# Patient Record
Sex: Female | Born: 1945
Health system: Southern US, Community
[De-identification: ages and names within clinical notes are randomized; demographics above are authoritative.]

## PROBLEM LIST (undated history)

## (undated) DIAGNOSIS — I4891 Unspecified atrial fibrillation: Secondary | ICD-10-CM

## (undated) DIAGNOSIS — Z720 Tobacco use: Secondary | ICD-10-CM

## (undated) DIAGNOSIS — Z8669 Personal history of other diseases of the nervous system and sense organs: Secondary | ICD-10-CM

## (undated) DIAGNOSIS — M069 Rheumatoid arthritis, unspecified: Secondary | ICD-10-CM

## (undated) DIAGNOSIS — I340 Nonrheumatic mitral (valve) insufficiency: Secondary | ICD-10-CM

## (undated) DIAGNOSIS — F319 Bipolar disorder, unspecified: Secondary | ICD-10-CM

## (undated) DIAGNOSIS — I482 Chronic atrial fibrillation, unspecified: Secondary | ICD-10-CM

## (undated) DIAGNOSIS — H269 Unspecified cataract: Secondary | ICD-10-CM

## (undated) DIAGNOSIS — M1990 Unspecified osteoarthritis, unspecified site: Secondary | ICD-10-CM

## (undated) DIAGNOSIS — H409 Unspecified glaucoma: Secondary | ICD-10-CM

## (undated) DIAGNOSIS — Z716 Tobacco abuse counseling: Secondary | ICD-10-CM

## (undated) DIAGNOSIS — E785 Hyperlipidemia, unspecified: Secondary | ICD-10-CM

## (undated) DIAGNOSIS — I252 Old myocardial infarction: Secondary | ICD-10-CM

## (undated) DIAGNOSIS — I4510 Unspecified right bundle-branch block: Secondary | ICD-10-CM

## (undated) DIAGNOSIS — J449 Chronic obstructive pulmonary disease, unspecified: Secondary | ICD-10-CM

## (undated) DIAGNOSIS — K56609 Unspecified intestinal obstruction, unspecified as to partial versus complete obstruction: Secondary | ICD-10-CM

## (undated) DIAGNOSIS — R413 Other amnesia: Secondary | ICD-10-CM

## (undated) DIAGNOSIS — F419 Anxiety disorder, unspecified: Secondary | ICD-10-CM

## (undated) DIAGNOSIS — I272 Pulmonary hypertension, unspecified: Secondary | ICD-10-CM

## (undated) HISTORY — DX: Hyperlipidemia, unspecified: E78.5

## (undated) HISTORY — PX: CHOLECYSTECTOMY: SHX55

## (undated) HISTORY — DX: Unspecified osteoarthritis, unspecified site: M19.90

## (undated) HISTORY — PX: NOSE SURGERY: SHX723

## (undated) HISTORY — PX: BACK SURGERY: SHX140

## (undated) HISTORY — DX: Other amnesia: R41.3

## (undated) HISTORY — DX: Anxiety disorder, unspecified: F41.9

## (undated) HISTORY — DX: Tobacco use: Z72.0

## (undated) HISTORY — DX: Unspecified atrial fibrillation: I48.91

## (undated) HISTORY — PX: OTHER SURGICAL HISTORY: SHX169

## (undated) HISTORY — DX: Unspecified intestinal obstruction, unspecified as to partial versus complete obstruction: K56.609

## (undated) HISTORY — DX: Pulmonary hypertension, unspecified: I27.20

## (undated) HISTORY — DX: Personal history of other diseases of the nervous system and sense organs: Z86.69

## (undated) HISTORY — DX: Old myocardial infarction: I25.2

## (undated) HISTORY — DX: Unspecified glaucoma: H40.9

## (undated) HISTORY — PX: VAGINAL HYSTERECTOMY: SUR661

## (undated) HISTORY — PX: TONSILLECTOMY: SUR1361

## (undated) HISTORY — DX: Tobacco abuse counseling: Z71.6

## (undated) HISTORY — DX: Rheumatoid arthritis, unspecified: M06.9

## (undated) HISTORY — DX: Bipolar disorder, unspecified: F31.9

## (undated) HISTORY — DX: Chronic obstructive pulmonary disease, unspecified: J44.9

## (undated) HISTORY — PX: ADENOIDECTOMY: SUR15

## (undated) HISTORY — DX: Nonrheumatic mitral (valve) insufficiency: I34.0

## (undated) HISTORY — DX: Unspecified right bundle-branch block: I45.10

## (undated) HISTORY — DX: Chronic atrial fibrillation, unspecified: I48.20

## (undated) HISTORY — DX: Unspecified cataract: H26.9

---

## 2014-09-15 DIAGNOSIS — I482 Chronic atrial fibrillation, unspecified: Secondary | ICD-10-CM

## 2014-09-15 DIAGNOSIS — I272 Pulmonary hypertension, unspecified: Secondary | ICD-10-CM

## 2014-09-15 DIAGNOSIS — I252 Old myocardial infarction: Secondary | ICD-10-CM

## 2014-09-15 HISTORY — PX: CARDIAC CATHETERIZATION: SHX172

## 2014-09-15 HISTORY — DX: Pulmonary hypertension, unspecified: I27.20

## 2014-09-15 HISTORY — DX: Old myocardial infarction: I25.2

## 2014-09-15 HISTORY — DX: Chronic atrial fibrillation, unspecified: I48.20

## 2014-09-15 HISTORY — PX: TRANSESOPHAGEAL ECHOCARDIOGRAM WITH CARDIOVERSION: SHX6140

## 2014-12-10 LAB — PROTIME-INR: INR: 1.8 — AB (ref 0.9–1.1)

## 2015-11-03 DIAGNOSIS — I2510 Atherosclerotic heart disease of native coronary artery without angina pectoris: Secondary | ICD-10-CM | POA: Diagnosis not present

## 2015-11-03 DIAGNOSIS — R413 Other amnesia: Secondary | ICD-10-CM | POA: Diagnosis not present

## 2015-11-03 DIAGNOSIS — F3170 Bipolar disorder, currently in remission, most recent episode unspecified: Secondary | ICD-10-CM | POA: Diagnosis not present

## 2015-11-03 DIAGNOSIS — I4891 Unspecified atrial fibrillation: Secondary | ICD-10-CM | POA: Diagnosis not present

## 2015-11-03 DIAGNOSIS — Z7901 Long term (current) use of anticoagulants: Secondary | ICD-10-CM | POA: Diagnosis not present

## 2015-11-03 DIAGNOSIS — G8929 Other chronic pain: Secondary | ICD-10-CM | POA: Diagnosis not present

## 2015-11-12 DIAGNOSIS — Z7901 Long term (current) use of anticoagulants: Secondary | ICD-10-CM | POA: Diagnosis not present

## 2015-11-19 DIAGNOSIS — Z7901 Long term (current) use of anticoagulants: Secondary | ICD-10-CM | POA: Diagnosis not present

## 2015-11-30 DIAGNOSIS — M79671 Pain in right foot: Secondary | ICD-10-CM | POA: Diagnosis not present

## 2015-11-30 DIAGNOSIS — M79641 Pain in right hand: Secondary | ICD-10-CM | POA: Diagnosis not present

## 2015-11-30 DIAGNOSIS — R5383 Other fatigue: Secondary | ICD-10-CM | POA: Diagnosis not present

## 2015-11-30 DIAGNOSIS — M545 Low back pain: Secondary | ICD-10-CM | POA: Diagnosis not present

## 2015-11-30 DIAGNOSIS — M79672 Pain in left foot: Secondary | ICD-10-CM | POA: Diagnosis not present

## 2015-11-30 DIAGNOSIS — M0609 Rheumatoid arthritis without rheumatoid factor, multiple sites: Secondary | ICD-10-CM | POA: Diagnosis not present

## 2015-11-30 DIAGNOSIS — M79642 Pain in left hand: Secondary | ICD-10-CM | POA: Diagnosis not present

## 2015-11-30 DIAGNOSIS — M5136 Other intervertebral disc degeneration, lumbar region: Secondary | ICD-10-CM | POA: Diagnosis not present

## 2015-12-09 ENCOUNTER — Other Ambulatory Visit: Payer: Self-pay | Admitting: Physician Assistant

## 2015-12-09 ENCOUNTER — Ambulatory Visit
Admission: RE | Admit: 2015-12-09 | Discharge: 2015-12-09 | Disposition: A | Discharge status: Home / Self Care | Payer: Medicare Other | Source: Ambulatory Visit | Attending: Physician Assistant | Admitting: Physician Assistant

## 2015-12-09 DIAGNOSIS — Z8719 Personal history of other diseases of the digestive system: Secondary | ICD-10-CM

## 2015-12-09 DIAGNOSIS — R1084 Generalized abdominal pain: Secondary | ICD-10-CM

## 2015-12-09 DIAGNOSIS — K5900 Constipation, unspecified: Secondary | ICD-10-CM

## 2015-12-15 ENCOUNTER — Ambulatory Visit (INDEPENDENT_AMBULATORY_CARE_PROVIDER_SITE_OTHER): Payer: Medicare Other | Admitting: Neurology

## 2015-12-15 ENCOUNTER — Encounter: Payer: Self-pay | Admitting: Neurology

## 2015-12-15 ENCOUNTER — Other Ambulatory Visit (INDEPENDENT_AMBULATORY_CARE_PROVIDER_SITE_OTHER): Payer: Medicare Other

## 2015-12-15 ENCOUNTER — Telehealth: Payer: Self-pay | Admitting: Family Medicine

## 2015-12-15 VITALS — BP 100/70 | HR 68 | Ht 63.0 in | Wt 160.1 lb

## 2015-12-15 DIAGNOSIS — F17200 Nicotine dependence, unspecified, uncomplicated: Secondary | ICD-10-CM

## 2015-12-15 DIAGNOSIS — R4689 Other symptoms and signs involving appearance and behavior: Principal | ICD-10-CM

## 2015-12-15 DIAGNOSIS — F3130 Bipolar disorder, current episode depressed, mild or moderate severity, unspecified: Secondary | ICD-10-CM | POA: Diagnosis not present

## 2015-12-15 DIAGNOSIS — F919 Conduct disorder, unspecified: Secondary | ICD-10-CM

## 2015-12-15 DIAGNOSIS — R4189 Other symptoms and signs involving cognitive functions and awareness: Secondary | ICD-10-CM

## 2015-12-15 DIAGNOSIS — F319 Bipolar disorder, unspecified: Secondary | ICD-10-CM

## 2015-12-15 LAB — FOLATE: Folate: 20 ng/mL (ref >5.9)

## 2015-12-15 LAB — VITAMIN B12: Vitamin B-12: 466 pg/mL (ref 211–911)

## 2015-12-15 NOTE — Telephone Encounter (Signed)
Called patient and spoke with dtr-in-law [name]. We had to change location of patients facility for MRI since [facility] is out of patients network. Closest facility in patients network is [facility] in [place]. They were agreeable to take patient there. Patient has BCBS of [place]. Gave all appt information.  Appt: 12/20/2015 @ 2:45 pm [facility]         [address]   [place], [place] [address]      Ph: [phone]

## 2015-12-15 NOTE — Addendum Note (Signed)
Addended by: Franciso Bend on: 12/15/2015 03:47 PM   Modules accepted: Orders

## 2015-12-15 NOTE — Progress Notes (Signed)
[facility] Neurology Division Clinic Note - Initial Visit   Date: 12/15/2015  Theresa Mendoza MRN: [id number] DOB: [birth year]   Dear Dr. Hyman Hopes:  Thank you for your kind referral of Theresa Mendoza for consultation of memory changes. Although her history is well known to you, please allow [place] to reiterate it for the purpose of our medical record. The patient was accompanied to the clinic by son and daughter-in-law who also provides collateral information.     History of Present Illness: Theresa Mendoza is a 70 y.o. Caucasian female with atrial fibrillation on coumadin, GERD, panic attacks, bipolar depression, and current tobacco use (150+ years) presenting for evaluation of memory changes.    She moved from [place] in April 2017 to lives with her son.  Her husband passed away 9 years ago and was feeling lonely so wanted to be around family.  Her son states that he urged her to move because for the past year, she was forgetting to eat, not taking medications, missing doctors appointments, never leave her home for weeks, which prompted the discussion to move her into an assisted living facility or move in with him. She had a home RN coming for the past year.  She was managing her own finances and reports that she missed one payment.  Her son has noticed that she pays the same bill multiple times.  For sometime, she was spending excessively stating that she was manic and had to get a loan for $70,000 on her home to pay her credit card bills because of ordering excessively on [employer].  Mood is depressed, but son states that it is not stable.  She can get frustrated easily. Her son states that she has great anxiety all the time.  She stopped driving herself, but unclear when this was.  She denies being involved in any MVAs.    In 2016, she suffered a heart attack and does not recall any of these details.  At least 10 years ago, patient had seen a physician for memory issues and started on a  medication which she took until 2013 when she went on a vacation and stopped all her medications.     Dementia questions Memory Are you repeating things excessively?  Yes Are you forgetting important details of conversations/events?  Yes Are you prone to misplacing items more now than in the past? Yes Can you tell me about some recent headlines?  "Besides Trump"  Executive Are you having trouble managing financial matters?  Sometimes, son overseas Are you taking your medications regularly w/o prompting?  No Can you organize and prepare a large holiday meal for multiple people? No Can you multitask effectively? No  Language Do you have any word finding difficulties? Sometimes Do you have trouble following instructions or a conversation? Yes Have you been avoiding reading or writing due to problems with recognition or remembering words? Yes Are you using generalities when speaking because of memory trouble?  Yes  Visuospatial Are you getting lost while driving?  Yes Are you getting turned around in your own home? No Do you have trouble recognizing familiar faces/family members/close friends?  No Do you have trouble dressing, putting on cloths?  Yes   Past Medical History  Diagnosis Date  . A-fib (HCC)   . Heart attack (HCC)   . Bowel obstruction (HCC)   . Memory loss   . Bipolar 1 disorder [facility])     Past Surgical History  Procedure Laterality Date  . Back surgery    .  Adenoidectomy    . Bowel obstruction    . Vaginal hysterectomy    . Tonsillectomy       Medications:  Outpatient Encounter Prescriptions as of 12/15/2015  Medication Sig Note  . atenolol (TENORMIN) 25 MG tablet Take 25 mg by mouth daily. 12/15/2015: Received from: External Pharmacy  . diazepam (VALIUM) 5 MG tablet TAKE ONE TABLET BY MOUTH AS NEEDED 3 TIMES A DAY AS NEEDED FOR SEVERE ANXIETY 12/15/2015: Received from: External Pharmacy  . diclofenac (VOLTAREN) 75 MG EC tablet Take 75 mg by mouth 2 (two)  times daily.   . digoxin (LANOXIN) 0.25 MG tablet Take 0.25 mg by mouth daily.   Marland Kitchen diltiazem (CARDIZEM CD) 240 MG 24 hr capsule Take 240 mg by mouth daily. 12/15/2015: Received from: External Pharmacy  . fentaNYL (DURAGESIC - DOSED MCG/HR) 100 MCG/HR Place 100 mcg onto the skin every 3 (three) days.   . furosemide (LASIX) 40 MG tablet Take 40 mg by mouth daily. 12/15/2015: Received from: External Pharmacy  . Liniments (SALONPAS EX) Apply topically.   . Magnesium Oxide 400 (240 Mg) MG TABS Take 1 tablet by mouth 2 (two) times daily. 12/15/2015: Received from: External Pharmacy  . Melatonin 10 MG TABS Take by mouth.   . pantoprazole (PROTONIX) 40 MG tablet Take 40 mg by mouth daily. 12/15/2015: Received from: External Pharmacy  . polyethylene glycol powder (GLYCOLAX/MIRALAX) powder MIX 1 CAPFUL IN WATER AND DRINK DAILY 12/15/2015: Received from: External Pharmacy  . promethazine (PHENERGAN) 25 MG tablet TAKE 1/2 TAB EVERY 6 TO 8 HOURS AS NEEDED FOR NAUSEA 12/15/2015: Received from: External Pharmacy  . traZODone (DESYREL) 100 MG tablet Take 100 mg by mouth at bedtime.   Marland Kitchen warfarin (COUMADIN) 5 MG tablet TAKE 1 TABLET BY MOUTH DAILY AT 4PM 12/15/2015: Received from: External Pharmacy   No facility-administered encounter medications on file as of 12/15/2015.     Allergies: Allergies not on file  Family History: Family History  Problem Relation Age of Onset  . Tuberculosis Father   . Depression Father   . Alzheimer's disease Mother   . Lung cancer Mother     Social History: Social History  Substance Use Topics  . Smoking status: Current Every Day Smoker -- 1.00 packs/day for 55 years    Types: Cigarettes  . Smokeless tobacco: Never Used  . Alcohol Use: No   Social History   Social History Narrative   Lives with son and daughter in law in a 2 story home.  Has 1 son.  Retired from [employer].  Education: high school.  Widowed.    Review of Systems:  CONSTITUTIONAL: No fevers, chills, night  sweats, or weight loss.   EYES: No visual changes or eye pain ENT: No hearing changes.  No history of nose bleeds.   RESPIRATORY: No cough, wheezing and shortness of breath.   CARDIOVASCULAR: Negative for chest pain, and palpitations.   GI: Negative for abdominal discomfort, blood in stools or black stools.  No recent change in bowel habits.   GU:  No history of incontinence.   MUSCLOSKELETAL: No history of joint pain or swelling.  No myalgias.   SKIN: Negative for lesions, rash, and itching.   HEMATOLOGY/ONCOLOGY: Negative for prolonged bleeding, bruising easily, and swollen nodes.  No history of cancer.   ENDOCRINE: Negative for cold or heat intolerance, polydipsia or goiter.   PSYCH:  ++depression or anxiety symptoms.   NEURO: As Above.   Vital Signs:  BP 100/70 mmHg  Pulse  68  Ht  (1.6 m)  Wt 160 lb 2 oz (72.632 kg)  BMI 28.37 kg/m2  SpO2 93%   General Medical Exam:   General:  Strong odor of tobacco, depressed-appearing, crying during the interview.   Eyes/ENT: see cranial nerve examination.   Neck: No masses appreciated.  Full range of motion without tenderness.  No carotid bruits. Respiratory:  Clear to auscultation, good air entry bilaterally.   Cardiac:  Regular rate and rhythm, no murmur.   Extremities:  No deformities, edema, or skin discoloration.  Skin:  No rashes or lesions.  Neurological Exam: MENTAL STATUS including orientation to time, place, person, recent and remote memory, attention span and concentration, language, and fund of knowledge is normal.  Speech is not dysarthric.  Montreal Cognitive Assessment  12/15/2015  Visuospatial/ Executive (0/5) 4  Naming (0/3) 3  Attention: Read list of digits (0/2) 2  Attention: Read list of letters (0/1) 1  Attention: Serial 7 subtraction starting at 100 (0/3) 1  Language: Repeat phrase (0/2) 2  Language : Fluency (0/1) 1  Abstraction (0/2) 2  Delayed Recall (0/5) 3  Orientation (0/6) 6  Total 25  Adjusted  Score (based on education) 26    CRANIAL NERVES: II:  No visual field defects.  Unremarkable fundi.   III-IV-VI: Pupils equal round and reactive to light.  Normal conjugate, extra-ocular eye movements in all directions of gaze.  No nystagmus.  No ptosis.   V:  Normal facial sensation.  Jaw jerk is absent.   VII:  Normal facial symmetry and movements.  No pathologic facial reflexes.  VIII:  Normal hearing and vestibular function.   IX-X:  Normal palatal movement.   XI:  Normal shoulder shrug and head rotation.   XII:  Normal tongue strength and range of motion, no deviation or fasciculation.  MOTOR:  No atrophy, fasciculations or abnormal movements.  No pronator drift.  Tone is normal.   Motor strength is 5/5 throughout.  MSRs:  Right                                                                 Left brachioradialis 2+  brachioradialis 2+  biceps 2+  biceps 2+  triceps 2+  triceps 2+  patellar 2+  patellar 2+  ankle jerk 1+  ankle jerk 1+  Hoffman no  Hoffman no  plantar response down  plantar response down   SENSORY:  Normal and symmetric perception of light touch, pinprick, vibration, and proprioception.   COORDINATION/GAIT: Normal finger-to- nose-finger and heel-to-shin.  Intact rapid alternating movements bilaterally.  Gait is mildly wide-based, stable, and slow.   IMPRESSION: Theresa Mendoza is a 70 year-old female referred for evaluation of memory loss.  To my surprise, she scored much better than I would have expected on her cognitive testing, scoring 26/30 on her MoCA.  She has a huge overlay of anxiety and mood disorder which makes it difficult to determine whether her cognitive changes are due to depression or underlying dementia syndrome.  There is a clear dissociation between her history and what her son provides, which she get extremely tearful and upset about.  Neuropsychological testing will be ordered to help characterize the nature of her symptoms.   She has a very  strong history of tobacco use (>150 pack years) and with her history of heart disease, vascular dementia is a possibility.   PLAN/RECOMMENDATIONS:  1. Check B12, folate, and vitamin B1 2. Neuropsychology evaluation 3. MRI brain without contrast 4. Referral to Behavior Health for depressed mood 5. Tobacco cessation strongly advised 6. Encouraged to engage in mentally stimulating activities  Return to clinic in 3 months.   The duration of this appointment visit was 60 minutes of face-to-face time with the patient.  Greater than 50% of this time was spent in counseling, explanation of diagnosis, planning of further management, and coordination of care.   Thank you for allowing me to participate in patient's care.  If I can answer any additional questions, I would be pleased to do so.    Sincerely,    Lendon George K. Allena Katz, DO

## 2015-12-15 NOTE — Patient Instructions (Addendum)
1. Check labs 2. Neuropsychology evaluation 3. MRI brain without contrast 4. Referral to Behavior Health for depressed mood 5.  Stop smoking Return to clinic in 3 months

## 2015-12-16 ENCOUNTER — Ambulatory Visit (INDEPENDENT_AMBULATORY_CARE_PROVIDER_SITE_OTHER): Payer: PPO | Admitting: Psychology

## 2015-12-16 DIAGNOSIS — R413 Other amnesia: Secondary | ICD-10-CM

## 2015-12-16 DIAGNOSIS — R4189 Other symptoms and signs involving cognitive functions and awareness: Secondary | ICD-10-CM

## 2015-12-16 DIAGNOSIS — F919 Conduct disorder, unspecified: Secondary | ICD-10-CM | POA: Diagnosis not present

## 2015-12-16 DIAGNOSIS — R4689 Other symptoms and signs involving appearance and behavior: Principal | ICD-10-CM

## 2015-12-16 DIAGNOSIS — F319 Bipolar disorder, unspecified: Secondary | ICD-10-CM

## 2015-12-16 NOTE — Progress Notes (Signed)
NEUROPSYCHOLOGICAL INTERVIEW (CPT: [id number])  Name: Theresa Mendoza Date of Birth: 1946-02-19 Date of Interview: 12/16/2015  Reason for Referral:  Theresa Mendoza is a 70 y.o. female who is referred for neuropsychological evaluation by Dr. Nita Sickle of [facility] due to concerns about cognitive and behavioral changes. This patient is accompanied in the office by her son and daughter in law who supplement the history.  History of Presenting Problem:  Theresa Mendoza reported having significant concerns about her memory and thinking abilities. She began noticing these problems around the time that her husband passed away. Throughout the appointment today, the patient provides contradicting information, and her son and daughter in law often have differing history than the patient. Her son and daughter in law appear to be more reliable historians and therefore the majority of the history provided here is taken from them.  The patient's son and daughter in law, Theresa Mendoza and Theresa Mendoza, reported that the patient came to them 11-12 years ago and said that she had been diagnosed with early onset Alzheimer's disease and had been put on a medication to slow it down. The patient says she has no recollection of this happening. At some point, she stopped taking the medication that was apparently prescribed to her for AD.  Theresa Mendoza and Theresa Mendoza did notice gradual onset of memory and cognitive difficulties around this time (11-12 years ago). The difficulties progressed, and they became more concerned about 6 years ago, a couple of years after her husband passed away.  They reported that over the past several years, the patient is much more distracted in conversation, has difficulty staying on topic, forgets overlearned information (her own middle name, her son's birthdate), misremembers/confabulates events, forgets recent conversations, has memory lapses for both recent and remote events, repeats herself,  misplaces/loses items, forgets appointments/obligations, forgets to take medication, forgets to eat, has word finding difficulty, and has significant comprehension difficulty in conversation. She stopped driving recently, but when she was driving, she would get lost even in familiar locations and frequently make wrong turns or be uncertain about directions.  Complicating matters is the patient's psychiatric history. She was diagnosed with manic depression in the early 1980s. Her son's description of manic and depressive episodes over the years is consistent with bipolar disorder. Manic episodes used to last several days and were characterized primarily by increased goal-directed activity, impulsive overspending, and lack of need for sleep. She continues to demonstrate brief periods of mania (lasting only a few hours at a time). Depressive episodes, however, are a  major problem. There is no history of hallucinations.   The patient also has a history of significant anxiety. In [place], she often did not leave her house because of anxiety. She is able to leave her home now if she has someone with her. She is also made very anxious by doctor's appointments. She recently has been fearful of falling in the shower.  Theresa Mendoza was treated with various mood stabilizers and antidepressants in the past but did not like the way they made her feel.  She has been hospitalized in the past for suicidal ideation, in the 58s and once in the 1990s. She saw a psychiatrist regularly for many years. She also saw a psychologist in more recent years, which she felt was helpful. She has not taken mood stabilizers or antidepressants in several years, and she has not been under the care of any mental health provider in about a year. Her family is looking for a psychiatrist and/or psychologist  for the patient, as well as a counselor who can help them support the patient. She does continue to take Valium twice a day. She reported  that she has been taking this since she was in her 44s.  The patient denied any suicidal ideation at the present time. Her family noted that she does regularly say "I wish I could die". She has not engaged in any self-harm behaviors. She denies plan or intention. She does feel hopeless about her situation, however. She reports significant grief and loneliness since the loss of her husband 8 years ago. She reports difficulty adjusting to [place] and living with her son and daughter-in-law (she moved here from [place] to live with them two months ago).   History of substance abuse or dependence was denied.  With regard to pertinent medical history, the patient reported that she had a heart attack in 2016 and did experience LOC or alteration in consciousness as a result. She reported she had to be "zapped" three times to start her heart.   The patient also has significant back pain since a back injury in the 1990s. She was taking hydrocodone and using a fentanyl patch prior to moving here. She still uses fentanyl patches but no longer has a prescription for hydrocodone. Her family is working to get her to a pain specialist.   The patient has difficulty with walking and balance. She has had several falls. Her last fall was in the past two weeks. She did not injure herself.  Family psychiatric history is reportedly significant for depression in her father. Family neurologic history is reportedly significant for Alzheimer's disease in her mother (first showed signs in her early 47s).   Theresa Mendoza was seen by Dr. Allena Katz yesterday for neurology consultation. She was administered the [facility] and received a score of 26/30.    Current Functioning: Theresa Mendoza lives with her son and daughter-in-law. She previously lived independently in her home in [place] but did hire a caregiver/companion to be with her three days a week because she was having trouble managing her affairs. She reduced her driving  over the past five years. When she moved here two months ago, her car was sold and she is no longer driving. Her son fills her pillboxes weekly and she takes them on a daily basis; she has trouble remembering to do so. She also has significant difficulty managing finances. She agreed to do her finances weekly with her son but then she forgot she agreed to that, and ended up paying bills multiple times without meaning to. Her family has to help manage her appointments. She is able to do simple meal preparation and use kitchen appliances.   Mrs. Congrove has been complaining of sleep difficulty for 4-5 years. She takes naps during the day. Her family has concerns about how little she eats. This has been going on for several years. For two months, she drank only Ensures. She now eats when food is prepared for her but does not seem to think to eat when she is on her own.   She does not drink any alcohol. She is a daily cigarette smoker (about a pack per day).    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 Divorced x3. Widowed x1. One adult son Theresa Mendoza)   Medical History: Past Medical History  Diagnosis Date  . A-fib (HCC)   . Heart attack (HCC)   . Bowel  obstruction (HCC)   . Memory loss   . Bipolar 1 disorder (HCC)    The patient also reported history of back injury ("fell down the stairs and broke my back") in the 1990s. A year after the injury, she took medical retirement.   The patient also reported that she is diagnosed with COPD.   Current Medications:  Outpatient Encounter Prescriptions as of 12/16/2015  Medication Sig  . atenolol (TENORMIN) 25 MG tablet Take 25 mg by mouth daily.  . diazepam (VALIUM) 5 MG tablet TAKE ONE TABLET BY MOUTH AS NEEDED 3 TIMES A DAY AS NEEDED FOR SEVERE ANXIETY  . diclofenac (VOLTAREN) 75 MG EC tablet Take 75 mg by mouth 2 (two) times daily.  . digoxin  (LANOXIN) 0.25 MG tablet Take 0.25 mg by mouth daily.  Marland Kitchen diltiazem (CARDIZEM CD) 240 MG 24 hr capsule Take 240 mg by mouth daily.  . fentaNYL (DURAGESIC - DOSED MCG/HR) 100 MCG/HR Place 100 mcg onto the skin every 3 (three) days.  . furosemide (LASIX) 40 MG tablet Take 40 mg by mouth daily.  . Liniments (SALONPAS EX) Apply topically.  . Magnesium Oxide 400 (240 Mg) MG TABS Take 1 tablet by mouth 2 (two) times daily.  . Melatonin 10 MG TABS Take by mouth.  . pantoprazole (PROTONIX) 40 MG tablet Take 40 mg by mouth daily.  . polyethylene glycol powder (GLYCOLAX/MIRALAX) powder MIX 1 CAPFUL IN WATER AND DRINK DAILY  . promethazine (PHENERGAN) 25 MG tablet TAKE 1/2 TAB EVERY 6 TO 8 HOURS AS NEEDED FOR NAUSEA  . traZODone (DESYREL) 100 MG tablet Take 100 mg by mouth at bedtime.  Marland Kitchen warfarin (COUMADIN) 5 MG tablet TAKE 1 TABLET BY MOUTH DAILY AT 4PM   No facility-administered encounter medications on file as of 12/16/2015.     Behavioral Observations:  Appearance: Appropriately dressed and groomed.  Gait: Ambulated independently, no gross abnormalities observed Speech: Fluent; normal rate, rhythm and volume Thought process: Tangential at times Affect: Full, anxious and dysthymic, very tearful Interpersonal: Appropriate   PLAN: There is medical necessity to proceed with neuropsychological assessment as the results will be used to aid in differential diagnosis and clinical decision-making and to inform specific treatment recommendations. Per the patient, two informants and medical records reviewed, there has been a change in cognitive functioning and a reasonable suspicion of dementia.  The patient will return next week to complete a full neuropsychological testing battery with a psychometrician. Education regarding testing procedures was provided.  Subsequently, she and her family will return for a follow-up session with this provider at which time her test performances and my impressions and  treatment recommendations will be provided in detail.   Full neuropsychological evaluation report to follow.    Total face to face time spent in clinical interview: 70 minutes (CPT: [id number])

## 2015-12-17 ENCOUNTER — Encounter: Payer: Self-pay | Admitting: Psychology

## 2015-12-19 LAB — VITAMIN B1: VITAMIN B1 (THIAMINE): 12 nmol/L (ref 8–30)

## 2015-12-20 ENCOUNTER — Encounter: Payer: Self-pay | Admitting: *Deleted

## 2015-12-23 ENCOUNTER — Ambulatory Visit (INDEPENDENT_AMBULATORY_CARE_PROVIDER_SITE_OTHER): Payer: PPO | Admitting: Psychology

## 2015-12-23 DIAGNOSIS — F919 Conduct disorder, unspecified: Secondary | ICD-10-CM

## 2015-12-23 DIAGNOSIS — R4189 Other symptoms and signs involving cognitive functions and awareness: Secondary | ICD-10-CM | POA: Diagnosis not present

## 2015-12-23 DIAGNOSIS — R4689 Other symptoms and signs involving appearance and behavior: Principal | ICD-10-CM

## 2015-12-24 ENCOUNTER — Encounter: Payer: Self-pay | Admitting: Cardiology

## 2015-12-24 ENCOUNTER — Ambulatory Visit (INDEPENDENT_AMBULATORY_CARE_PROVIDER_SITE_OTHER): Payer: Medicare Other | Admitting: Cardiology

## 2015-12-24 VITALS — BP 108/52 | HR 78 | Ht 63.0 in | Wt 156.1 lb

## 2015-12-24 DIAGNOSIS — I4891 Unspecified atrial fibrillation: Secondary | ICD-10-CM | POA: Insufficient documentation

## 2015-12-24 DIAGNOSIS — I252 Old myocardial infarction: Secondary | ICD-10-CM

## 2015-12-24 DIAGNOSIS — I482 Chronic atrial fibrillation, unspecified: Secondary | ICD-10-CM

## 2015-12-24 DIAGNOSIS — I4819 Other persistent atrial fibrillation: Secondary | ICD-10-CM | POA: Insufficient documentation

## 2015-12-24 DIAGNOSIS — R079 Chest pain, unspecified: Secondary | ICD-10-CM

## 2015-12-24 DIAGNOSIS — F17200 Nicotine dependence, unspecified, uncomplicated: Secondary | ICD-10-CM

## 2015-12-24 DIAGNOSIS — I2510 Atherosclerotic heart disease of native coronary artery without angina pectoris: Secondary | ICD-10-CM | POA: Diagnosis not present

## 2015-12-24 DIAGNOSIS — E785 Hyperlipidemia, unspecified: Secondary | ICD-10-CM

## 2015-12-24 DIAGNOSIS — I4821 Permanent atrial fibrillation: Secondary | ICD-10-CM | POA: Insufficient documentation

## 2015-12-24 DIAGNOSIS — I4811 Longstanding persistent atrial fibrillation: Secondary | ICD-10-CM | POA: Insufficient documentation

## 2015-12-24 NOTE — Patient Instructions (Signed)
Medication Instructions:  Your physician recommends that you continue on your current medications as directed. Please refer to the Current Medication list given to you today.   Labwork: None  Testing/Procedures: Your physician has requested that you have an echocardiogram. Echocardiography is a painless test that uses sound waves to create images of your heart. It provides your doctor with information about the size and shape of your heart and how well your heart's chambers and valves are working. This procedure takes approximately one hour. There are no restrictions for this procedure.  Your physician has requested that you have a lexiscan myoview. For further information please visit [email or web address]. Please follow instruction sheet, as given.  Follow-Up: Your physician wants you to follow-up in: 6 months with Dr. Turner. You will receive a reminder letter in the mail two months in advance. If you don't receive a letter, please call our office to schedule the follow-up appointment.   Any Other Special Instructions Will Be Listed Below (If Applicable).     If you need a refill on your cardiac medications before your next appointment, please call your pharmacy.   

## 2015-12-24 NOTE — Progress Notes (Signed)
Cardiology Office Note    Date:  12/25/2015   ID:  Theresa Mendoza, DOB Feb 20, 1946, MRN [id number]  PCP:  Frederich Chick, MD  Cardiologist:  Armanda Magic, MD   Chief Complaint  Patient presents with  . Coronary Artery Disease  . Atrial Fibrillation    History of Present Illness:  Theresa Mendoza is a 70 y.o. female with a history of tobacco use, ASCAD with MI remotely and atrial fibrillation on chronic anticoagulation with coumadin who presents today to establish with a Cardiologist. She recently moved here from [place] in April and lives with her son.  Unfortunately we do not have any of the records from her prior cardiac history.  She denies any LE edema, dizziness, palpitations or syncope. She says that she has had some tightness in the chest which she feels is due to her COPD.  She continues to smoke but has decreased from 3ppd to 1ppd.  She denies any exertional CP or pain similar to her MI.  When she had her MI she was having problems with severe nausea as well as CP.  She says that she has been having some nausea mainly after she eats and is seeing a GI doctor. She has chronic SOB that has improved with cutting back on smoking.      Past Medical History  Diagnosis Date  . Bowel obstruction (HCC)   . Memory loss   . Bipolar 1 disorder (HCC)   . CAD (coronary artery disease), native coronary artery 09/2014    no heart cath done - medical management  . Old MI (myocardial infarction) 09/2014  . Chronic atrial fibrillation (HCC) 09/2014    on chronic anticoagulation on coumadin.  She has failed several DCCVs in the past.  . COPD (chronic obstructive pulmonary disease) (HCC)   . Dyslipidemia, goal LDL below 70 12/25/2015    Past Surgical History  Procedure Laterality Date  . Back surgery    . Adenoidectomy    . Bowel obstruction    . Vaginal hysterectomy    . Tonsillectomy      Current Medications: Outpatient Prescriptions Prior to Visit  Medication Sig Dispense Refill    . atenolol (TENORMIN) 25 MG tablet Take 25 mg by mouth daily.  0  . diazepam (VALIUM) 5 MG tablet TAKE ONE TABLET BY MOUTH 3 TIMES A DAY AS NEEDED FOR SEVERE ANXIETY  1  . digoxin (LANOXIN) 0.25 MG tablet Take 0.25 mg by mouth daily.    Marland Kitchen diltiazem (CARDIZEM CD) 240 MG 24 hr capsule Take 240 mg by mouth daily.  1  . fentaNYL (DURAGESIC - DOSED MCG/HR) 100 MCG/HR Place 100 mcg onto the skin every 3 (three) days.    . furosemide (LASIX) 40 MG tablet Take 40 mg by mouth daily.  1  . Magnesium Oxide 400 (240 Mg) MG TABS Take 1 tablet by mouth 2 (two) times daily.  1  . Melatonin 10 MG TABS Take 1 tablet by mouth at bedtime as needed (sleep).     . pantoprazole (PROTONIX) 40 MG tablet Take 40 mg by mouth daily.  12  . polyethylene glycol powder (GLYCOLAX/MIRALAX) powder MIX 1 CAPFUL IN WATER AND DRINK DAILY  12  . promethazine (PHENERGAN) 25 MG tablet TAKE 1/2 TABLET BY MOUTH EVERY 6 TO 8 HOURS AS NEEDED FOR NAUSEA  1  . traZODone (DESYREL) 100 MG tablet Take 200 mg by mouth at bedtime.     Marland Kitchen warfarin (COUMADIN) 5 MG tablet TAKE 1  TABLET BY MOUTH DAILY AT 4PM  3  . diclofenac (VOLTAREN) 75 MG EC tablet Take 75 mg by mouth 2 (two) times daily.    . Liniments (SALONPAS EX) Apply topically as directed.      No facility-administered medications prior to visit.     Allergies:   Review of patient's allergies indicates no known allergies.   Social History   Social History  . Marital Status: Widowed    Spouse Name: N/A  . Number of Children: N/A  . Years of Education: N/A   Social History Main Topics  . Smoking status: Current Every Day Smoker -- 1.00 packs/day for 55 years    Types: Cigarettes  . Smokeless tobacco: Never Used  . Alcohol Use: No  . Drug Use: No  . Sexual Activity: Not Asked   Other Topics Concern  . None   Social History Narrative   Lives with son and daughter in law in a 2 story home.  Has 1 son.  Retired from [employer].  Education: high school.  Widowed.     Family  History:  The patient's family history includes Alzheimer's disease in her mother; Depression in her father; Lung cancer in her mother; Tuberculosis in her father.   ROS:   Please see the history of present illness.    ROS All other systems reviewed and are negative.   PHYSICAL EXAM:   VS:  BP 108/52 mmHg  Pulse 78  Ht 5\' 3"  (1.6 m)  Wt 156 lb 1.9 oz (70.816 kg)  BMI 27.66 kg/m2  SpO2 92%   GEN: Well nourished, well developed, in no acute distress HEENT: normal Neck: no JVD, carotid bruits, or masses Cardiac: RRR; no murmurs, rubs, or gallops,no edema.  Intact distal pulses bilaterally.  Respiratory:  clear to auscultation bilaterally, normal work of breathing GI: soft, nontender, nondistended, + BS MS: no deformity or atrophy Skin: warm and dry, no rash Neuro:  Alert and Oriented x 3, Strength and sensation are intact Psych: euthymic mood, full affect  Wt Readings from Last 3 Encounters:  12/24/15 156 lb 1.9 oz (70.816 kg)  12/15/15 160 lb 2 oz (72.632 kg)      Studies/Labs Reviewed:   EKG:  EKG is ordered today.  The ekg ordered today demonstrates atrial fibrillation with CVR  Recent Labs: No results found for requested labs within last 365 days.   Lipid Panel No results found for: CHOL, TRIG, HDL, CHOLHDL, VLDL, LDLCALC, LDLDIRECT  Additional studies/ records that were reviewed today include:  OV notes from PCP and Neurology    ASSESSMENT:    1. Chest pain, unspecified chest pain type   2. Coronary artery disease involving native coronary artery of native heart without angina pectoris   3. Old MI (myocardial infarction)   4. Chronic atrial fibrillation (HCC)   5. Tobacco use disorder   6. Dyslipidemia, goal LDL below 70      PLAN:  In order of problems listed above:  1. Chest pain - this is not like what she had with her NSTEMI.  She attributes this more to her COPD and is a tightness.  Her anginal equivalent in the past included CP and severe nausea.   She has had more nausea recently and is seeing GI. 2. ASCAD - this is a presumed diagnosis based on elevated troponins. During hospitalization in 12/17/15.  She says that she never had a cath or stress test.  She continues to smoke.  I would like to  get a nuclear stress test to assess for ischemia.  I will also try to get a copy of her hospital records from her MI admission.  Continue BB.  She is not on ASA due to warfarin use.  She has not been on a statin.   3. Chronic atrial fibrillation rate controlled.  Continue digoxin, CCB, BB and warfarin.  Check 2D echo to assess LVF and LA size.   4. COPD with ongoing tobacco use - recommended that she try to cut back and quit her cigarettes    Medication Adjustments/Labs and Tests Ordered: Current medicines are reviewed at length with the patient today.  Concerns regarding medicines are outlined above.  Medication changes, Labs and Tests ordered today are listed in the Patient Instructions below.  Patient Instructions  Medication Instructions:  Your physician recommends that you continue on your current medications as directed. Please refer to the Current Medication list given to you today.   Labwork: None  Testing/Procedures: Your physician has requested that you have an echocardiogram. Echocardiography is a painless test that uses sound waves to create images of your heart. It provides your doctor with information about the size and shape of your heart and how well your heart's chambers and valves are working. This procedure takes approximately one hour. There are no restrictions for this procedure.   Your physician has requested that you have a lexiscan myoview. For further information please visit [email or web address] Please follow instruction sheet, as given.  Follow-Up: Your physician wants you to follow-up in: 6 months with Dr. Mayford Knife. You will receive a reminder letter in the mail two months in advance. If you don't receive a letter, please  call our office to schedule the follow-up appointment.   Any Other Special Instructions Will Be Listed Below (If Applicable).     If you need a refill on your cardiac medications before your next appointment, please call your pharmacy.       Signed, Armanda Magic, MD  12/25/2015 10:02 PM    [facility] [address] [place], [place], [place]  [address] Phone: [phone]; Fax: [phone]

## 2015-12-25 ENCOUNTER — Encounter: Payer: Self-pay | Admitting: Cardiology

## 2015-12-25 DIAGNOSIS — E785 Hyperlipidemia, unspecified: Secondary | ICD-10-CM | POA: Insufficient documentation

## 2015-12-25 HISTORY — DX: Hyperlipidemia, unspecified: E78.5

## 2015-12-27 ENCOUNTER — Telehealth: Payer: Self-pay | Admitting: Cardiology

## 2015-12-27 NOTE — Telephone Encounter (Signed)
Records received from Dr.Dan Belinda Block office placed in chart prep bin.

## 2015-12-30 ENCOUNTER — Telehealth: Payer: Self-pay | Admitting: Cardiology

## 2015-12-30 ENCOUNTER — Encounter: Payer: Self-pay | Admitting: Psychology

## 2015-12-30 ENCOUNTER — Telehealth: Payer: Self-pay | Admitting: Neurology

## 2015-12-30 ENCOUNTER — Ambulatory Visit (INDEPENDENT_AMBULATORY_CARE_PROVIDER_SITE_OTHER): Payer: PPO | Admitting: Psychology

## 2015-12-30 DIAGNOSIS — R413 Other amnesia: Secondary | ICD-10-CM

## 2015-12-30 DIAGNOSIS — F319 Bipolar disorder, unspecified: Secondary | ICD-10-CM

## 2015-12-30 NOTE — Telephone Encounter (Signed)
Patient's daughter in law given results.

## 2015-12-30 NOTE — Telephone Encounter (Signed)
MRI brain wo contrast 12/29/2015: 1.  No acute intracranial abnormality.   2.  Incidental quadrigeminal plate cistern lipoma.  Please inform patient that her MRI brain is within normal limits, there is a very small lipoma which is a normal variant and it does not seem to be causing any problems.

## 2015-12-30 NOTE — Telephone Encounter (Signed)
Message sent to Medical Records to obtain requested information.

## 2015-12-30 NOTE — Progress Notes (Signed)
NEUROPSYCHOLOGICAL EVALUATION   Name:    Theresa Mendoza  Date of Birth:   [birth year] Date of Evaluation:  12/16/2015   Date of Feedback:  12/30/2015      Background Information:  Reason for Referral:  Theresa Mendoza is a 70 y.o., widowed female referred by Dr. Nita Sickle to assess her current level of cognitive functioning and assist in differential diagnosis. The current evaluation consisted of a review of available medical records, an interview with the patient and her son and daughter-in-law, and the completion of a neuropsychological testing battery. Informed consent was obtained.  History of Presenting Problem:  Theresa Mendoza reported having significant concerns about her memory and thinking abilities. She began noticing these problems around the time that her husband passed away. Throughout the appointment today, the patient provides contradicting information, and her son and daughter in law often have differing history than the patient. Her son and daughter in law appear to be more reliable historians and therefore the majority of the history provided here is taken from them.  The patient's son and daughter in law, Theresa Mendoza and Theresa Mendoza, reported that the patient came to them 11-12 years ago and said that she had been diagnosed with early onset Alzheimer's disease and had been put on a medication to slow it down. The patient says she has no recollection of this happening. At some point, she stopped taking the medication that was apparently prescribed to her for AD.  Theresa Mendoza and Theresa Mendoza did notice gradual onset of memory and cognitive difficulties around this time (11-12 years ago). The difficulties progressed, and they became more concerned about 6 years ago, a couple of years after her husband passed away.  They reported that over the past several years, the patient is much more distracted in conversation, has difficulty staying on topic, forgets overlearned information (her own  middle name, her son's birthdate), misremembers/confabulates events, forgets recent conversations, has memory lapses for both recent and remote events, repeats herself, misplaces/loses items, forgets appointments/obligations, forgets to take medication, forgets to eat, has word finding difficulty, and has significant comprehension difficulty in conversation. She stopped driving recently, but when she was driving, she would get lost even in familiar locations and frequently make wrong turns or be uncertain about directions.  Complicating matters is the patient's psychiatric history. She was diagnosed with manic depression in the early 1980s. Her son's description of manic and depressive episodes over the years is consistent with bipolar disorder. Manic episodes used to last several days and were characterized primarily by increased goal-directed activity, impulsive overspending, and lack of need for sleep. She continues to demonstrate brief periods of mania (lasting only a few hours at a time). Depressive episodes, however, are a major problem. There is no history of hallucinations.   The patient also has a history of significant anxiety. In [place], she often did not leave her house because of anxiety. She is able to leave her home now if she has someone with her. She is also made very anxious by doctor's appointments. She recently has been fearful of falling in the shower.  Theresa Mendoza was treated with various mood stabilizers and antidepressants in the past but did not like the way they made her feel. She has been hospitalized in the past for suicidal ideation, in the 48s and once in the 1990s. She saw a psychiatrist regularly for many years. She also saw a psychologist in more recent years, which she felt was helpful. She has not taken mood  stabilizers or antidepressants in several years, and she has not been under the care of any mental health provider in about a year. Her family is looking for a  psychiatrist and/or psychologist for the patient, as well as a counselor who can help them support the patient. She does continue to take Valium twice a day. She reported that she has been taking this since she was in her 40s.  The patient denied any suicidal ideation at the present time. Her family noted that she does regularly say "I wish I could die". She has not engaged in any self-harm behaviors. She denies plan or intention. She does feel hopeless about her situation, however. She reports significant grief and loneliness since the loss of her husband 8 years ago. She reports difficulty adjusting to [place] and living with her son and daughter-in-law (she moved here from [place] to live with them two months ago).   History of substance abuse or dependence was denied.  With regard to pertinent medical history, the patient reported that she had a heart attack in 2016 and did experience LOC or alteration in consciousness as a result. She reported she had to be "zapped" three times to start her heart.   The patient also has significant back pain since a back injury in the 1990s. She was taking hydrocodone and using a fentanyl patch prior to moving here. She still uses fentanyl patches but no longer has a prescription for hydrocodone. Her family is working to get her to a pain specialist.   The patient has difficulty with walking and balance. She has had several falls. Her last fall was in the past two weeks. She did not injure herself.  Family psychiatric history is reportedly significant for depression in her father. Family neurologic history is reportedly significant for Alzheimer's disease in her mother (first showed signs in her early 24s).   Theresa Mendoza was seen by Dr. Allena Katz on 12/15/2015 for neurology consultation. She was administered the [facility] and received a score of 26/30. Theresa Mendoza also completed an MRI on 12/29/2015 which reportedly did not show any acute intracranial  abnormality.  Current Functioning: Theresa Mendoza lives with her son and daughter-in-law. She previously lived independently in her home in [place] but did hire a caregiver/companion to be with her three days a week because she was having trouble managing her affairs. She reduced her driving over the past five years. When she moved here two months ago, her car was sold and she is no longer driving. Her son fills her pillboxes weekly and she takes them on a daily basis; she has trouble remembering to do so. She also has significant difficulty managing finances. She agreed to do her finances weekly with her son but then she forgot she agreed to that, and ended up paying bills multiple times without meaning to. Her family has to help manage her appointments. She is able to do simple meal preparation and use kitchen appliances.   Theresa Mendoza has been complaining of sleep difficulty for 4-5 years. She takes naps during the day. Her family has concerns about how little she eats. This has been going on for several years. For two months, she drank only Ensures. She now eats when food is prepared for her but does not seem to think to eat when she is on her own.   She does not drink any alcohol. She is a daily cigarette smoker (about a pack per day).   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 Divorced x3. Widowed x1. One adult son Theresa Mendoza) Medical History:  Past Medical History  Diagnosis Date  . Bowel obstruction (HCC)   . Memory loss   . Bipolar 1 disorder (HCC)   . CAD (coronary artery disease), native coronary artery 09/2014    no heart cath done - medical management  . Old MI (myocardial infarction) 09/2014  . Chronic atrial fibrillation (HCC) 09/2014    on chronic anticoagulation on coumadin.  She has failed several DCCVs in the past.  . COPD (chronic obstructive pulmonary disease) (HCC)    . Dyslipidemia, goal LDL below 70 12/25/2015   Current medications:  Outpatient Encounter Prescriptions as of 12/30/2015  Medication Sig  . atenolol (TENORMIN) 25 MG tablet Take 25 mg by mouth daily.  . diazepam (VALIUM) 5 MG tablet TAKE ONE TABLET BY MOUTH 3 TIMES A DAY AS NEEDED FOR SEVERE ANXIETY  . digoxin (LANOXIN) 0.25 MG tablet Take 0.25 mg by mouth daily.  Marland Kitchen diltiazem (CARDIZEM CD) 240 MG 24 hr capsule Take 240 mg by mouth daily.  . fentaNYL (DURAGESIC - DOSED MCG/HR) 100 MCG/HR Place 100 mcg onto the skin every 3 (three) days.  . furosemide (LASIX) 40 MG tablet Take 40 mg by mouth daily.  . Magnesium Oxide 400 (240 Mg) MG TABS Take 1 tablet by mouth 2 (two) times daily.  . Melatonin 10 MG TABS Take 1 tablet by mouth at bedtime as needed (sleep).   . ondansetron (ZOFRAN) 4 MG tablet Take 4 mg by mouth every 8 (eight) hours as needed for nausea or vomiting.  . pantoprazole (PROTONIX) 40 MG tablet Take 40 mg by mouth daily.  . polyethylene glycol powder (GLYCOLAX/MIRALAX) powder MIX 1 CAPFUL IN WATER AND DRINK DAILY  . promethazine (PHENERGAN) 25 MG tablet TAKE 1/2 TABLET BY MOUTH EVERY 6 TO 8 HOURS AS NEEDED FOR NAUSEA  . traZODone (DESYREL) 100 MG tablet Take 200 mg by mouth at bedtime.   Marland Kitchen warfarin (COUMADIN) 5 MG tablet TAKE 1 TABLET BY MOUTH DAILY AT 4PM  . Wheat Dextrin (BENEFIBER PO) Take by mouth as directed.   No facility-administered encounter medications on file as of 12/30/2015.    Current Examination:  Behavioral Observations:  Appearance: Appropriately dressed and groomed.  Gait: Ambulated independently, no gross abnormalities observed Speech: Fluent; normal rate, rhythm and volume Thought process: Tangential at times Affect: Full, anxious and dysthymic, very tearful Interpersonal: Appropriate  Tests Administered: . Test of Premorbid Functioning (TOPF) . Wechsler Adult Intelligence Scale-Fourth Edition (WAIS-IV): Similarities, Matrix Reasoning, Arithmetic,  Symbol Search, Coding and Digit Span subtests . Wechsler Memory Scale-Fourth Edition (WMS-IV) Older Adult Version (ages 78-90): Logical Memory I, II and Recognition subtests  . DIRECTV Verbal Learning Test - 2nd Edition (CVLT-2) Short Form . Repeatable Battery for the Assessment of Neuropsychological Status (RBANS) Form A:  Figure Copy and Recall subtests . Controlled Oral Word Association Test (COWAT) . Trail Making Test A and B . Clock drawing test . Geriatric Depression Scale (GDS) 15 Item . Generalized Anxiety Disorder - 7 item screener (GAD-7) . Beck Depression Inventory - Second edition (BDI-II)  Test Results: Note: Standardized scores are presented only for use by appropriately trained professionals and to allow for any future test-retest comparison. These scores should not be interpreted without consideration of all the information that is contained in the rest of the report. The most recent standardization samples from the test publisher  or other sources were used whenever possible to derive standard scores; scores were corrected for age, gender, ethnicity and education when available.   Test Scores:  Test Name Standardized Score Descriptor  TOPF SS=101 Average  WAIS-IV Subtests    Arithmetic ss=10 Average  Symbol Search ss=7 Low average  Similarities ss=10 Average  Matrix Reasoning ss=6 Low average  Coding ss=6 Low Average  Digit Span ss=8 Average (Low end of average)  RBANS Subtests    Figure Copy Z=0.53 Average  Figure Recall Z=-1.2 Low Average  CVLT-II Scores    Trial 1 Z=-1.0 Low Average  Trial 4 Z=-1.5 Borderline Impaired  Trials 1-4 total T=32 Borderline Impaired  SD Free Recall Z=-1.5 Borderline Impaired  LD Free Recall Z=-1.0 Low Average  LD Cued Recall Z=0 Average  Recognition Discriminability (8/9 hits, 1 false positive) Z=-0.5 Average  Forced Choice  WNL (9/9)  WMS-IV Subtests    Logical Memory I ss=8 Average (Low end of average)  Logical Memory II ss=4  Impaired  Logical Memory Recognition 17-25 cumulative percentage Low Average  COWAT-FAS T=58 High Average  COWAT-Animals T=42 Low Average  Trail Making Test A 0 errors T=44 Average  Trail Making Test B 0 errors T=41 Low Average  Clock Drawing  Correct time placement; poor planning/ organization  GDS-15  Severe (raw score=13)  GAD-7  Severe (raw score=21)  BDI-II  Severe (raw score=51)   Description of Test Results: Premorbid verbal intellectual abilities were estimated to have been within at least the average range based on a test of word reading. Psychomotor processing speed was low average. Auditory attention and working memory were average. Visual-spatial construction was average. Verbal fluency was within normal limits. Semantic verbal fluency was low average, and verbal fluency with phonemic search restrictions was high average. With regard to verbal memory, encoding and acquisition of non-contextual information (i.e., word list) was borderline impaired across four learning trials. After a brief distracter task, free recall was borderline impaired. After a delay, free recall was low average. With semantic cueing, recall improved to the average range. Performance on a yes/no recognition task was average. On another verbal memory test, encoding and acquisition of contextual auditory information (i.e., short stories) was average. After a delay, free recall was impaired. Performance on a yes/no recognition task was within normal limits (low average). With regard to non-verbal memory, delayed free recall of visual information was low average. Executive functioning was somewhat variable. Mental flexibility and set-shifting were low average on Trails B. Verbal abstract reasoning was average, and non-verbal abstract reasoning was low average. Performance on a clock drawing task was impaired due to poor planning and organization; however, she did indicate the time of "ten past eleven" correctly. On  self-report questionnaires, the patient's responses were indicative of clinically significant anxiety and depression, both in the severe range. On the BDI-II, she endorsed passive suicidal ideation but denied intention or plan. Upon further questioning by the psychometrician, she reported that she has had thoughts of wishing she was dead but she insisted that she has not had thoughts of harming herself.   Summary / Clinical Impressions: Cognitive symptoms and functional decline are most likely due to untreated bipolar disorder. Rule out mild cognitive impairment. Results of cognitive testing were largely within normal limits. Specifically, she performed within the normal range on tests of attention, processing speed, visual-spatial construction, verbal fluency, mental flexibility/set-shifting, and reasoning. She did display diminished encoding/retrieval abilities on verbal memory tasks, but cueing and recognition format greatly assisted recall, arguing  against a consolidation deficit and instead suggesting mild disruption of frontal-subcortical memory systems.   These test results do not warrant a diagnosis of dementia. Additionally, these test results are not consistent with her current level of functioning (i.e., I would expect much higher functioning based on these cognitive test results), and therefore suggest non-neurologic etiology of her cognitive difficulties in daily life. It is my impression that the patient's untreated bipolar disorder is likely causing her functional impairment, rather than a neurodegenerative process. Daily use of valium may also be a contributing factor. A diagnosis of mild cognitive impairment could be considered, but this would not account for the significant difficulties in her daily functioning.   Recommendations: Based on the findings of the present evaluation, the following recommendations are offered:  1. Psychiatric treatment for bipolar disorder is highly  recommended. At the present time, the only psychotropic medication the patient is taking is valium, which she has been on for decades. I will place a referral to Dr. Jennelle Human for psychiatric evaluation and treatment. They are also requesting therapy for the family, which I think is a great idea. I will place a referral to [facility] Fail or Larena Sox).  2. The patient and her family were reassured that test results are not consistent with a dementia syndrome and do not suggest an underlying Alzheimer's disease at this point in time. 3. Neuropsychological re-assessment could be considered in the future after the patient is stabilized psychiatrically.    Feedback to Patient: Theresa Mendoza and her son and daughter in law returned for a feedback appointment on 12/30/2015 to review the results of her neuropsychological evaluation with this provider. 30 minutes face-to-face time was spent reviewing her test results, my impressions and my recommendations as detailed above. The patient reported that she was open to both psychopharmacological intervention and counseling. She reported continued significant depression but denied suicidal ideation or intention.    Total time spent on this patient's case: 90791x1 unit; 96118x6 units including medical record review, administration and scoring of neuropsychological tests, interpretation of test results, preparation of this report, and review of results to the patient.      Thank you for your referral of Theresa Mendoza. Please feel free to contact me if you have any questions or concerns regarding this report.

## 2015-12-30 NOTE — Telephone Encounter (Signed)
Reviewed noted from [facility] in [place] and apparently had a cath recently that was normal but cath was not included.  Please get a copy of cath and cardioversions done

## 2016-01-03 NOTE — Telephone Encounter (Signed)
Records received and placed in Dr. Norris Cross "to be reviewed" file.

## 2016-01-04 ENCOUNTER — Other Ambulatory Visit: Payer: Self-pay | Admitting: *Deleted

## 2016-01-06 ENCOUNTER — Telehealth (HOSPITAL_COMMUNITY): Payer: Self-pay | Admitting: *Deleted

## 2016-01-06 NOTE — Telephone Encounter (Signed)
Left message on voicemail per DPR in reference to upcoming appointment scheduled on 6/27/17with detailed instructions given per Myocardial Perfusion Study Information Sheet for the test. LM to arrive 15 minutes early, and that it is imperative to arrive on time for appointment to keep from having the test rescheduled. If you need to cancel or reschedule your appointment, please call the office within 24 hours of your appointment. Failure to do so may result in a cancellation of your appointment, and a $50 no show fee. Phone number given for call back for any questions. Sascha Baugher J Kierre Deines, RN  

## 2016-01-11 ENCOUNTER — Other Ambulatory Visit (HOSPITAL_COMMUNITY): Payer: Medicare Other

## 2016-01-11 ENCOUNTER — Encounter (HOSPITAL_COMMUNITY): Payer: Medicare Other

## 2016-01-19 ENCOUNTER — Encounter: Payer: Self-pay | Admitting: Cardiology

## 2016-01-27 ENCOUNTER — Telehealth (HOSPITAL_COMMUNITY): Payer: Self-pay | Admitting: *Deleted

## 2016-01-27 NOTE — Telephone Encounter (Signed)
Left message on voicemail per DPR in reference to upcoming appointment scheduled on 02/01/16 at 0945 with detailed instructions given per Myocardial Perfusion Study Information Sheet for the test. LM to arrive 15 minutes early, and that it is imperative to arrive on time for appointment to keep from having the test rescheduled. If you need to cancel or reschedule your appointment, please call the office within 24 hours of your appointment. Failure to do so may result in a cancellation of your appointment, and a $50 no show fee. Phone number given for call back for any questions.

## 2016-02-01 ENCOUNTER — Ambulatory Visit (HOSPITAL_BASED_OUTPATIENT_CLINIC_OR_DEPARTMENT_OTHER): Payer: PPO

## 2016-02-01 ENCOUNTER — Other Ambulatory Visit: Payer: Self-pay

## 2016-02-01 ENCOUNTER — Ambulatory Visit (HOSPITAL_COMMUNITY): Payer: PPO | Attending: Internal Medicine

## 2016-02-01 VITALS — Ht 63.0 in | Wt 156.0 lb

## 2016-02-01 DIAGNOSIS — E785 Hyperlipidemia, unspecified: Secondary | ICD-10-CM | POA: Insufficient documentation

## 2016-02-01 DIAGNOSIS — I358 Other nonrheumatic aortic valve disorders: Secondary | ICD-10-CM | POA: Insufficient documentation

## 2016-02-01 DIAGNOSIS — I340 Nonrheumatic mitral (valve) insufficiency: Secondary | ICD-10-CM | POA: Diagnosis not present

## 2016-02-01 DIAGNOSIS — R079 Chest pain, unspecified: Secondary | ICD-10-CM

## 2016-02-01 DIAGNOSIS — R0609 Other forms of dyspnea: Secondary | ICD-10-CM | POA: Diagnosis not present

## 2016-02-01 DIAGNOSIS — R9439 Abnormal result of other cardiovascular function study: Secondary | ICD-10-CM | POA: Insufficient documentation

## 2016-02-01 DIAGNOSIS — I2510 Atherosclerotic heart disease of native coronary artery without angina pectoris: Secondary | ICD-10-CM | POA: Insufficient documentation

## 2016-02-01 DIAGNOSIS — R0602 Shortness of breath: Secondary | ICD-10-CM | POA: Diagnosis not present

## 2016-02-01 DIAGNOSIS — R0789 Other chest pain: Secondary | ICD-10-CM | POA: Insufficient documentation

## 2016-02-01 DIAGNOSIS — Z87891 Personal history of nicotine dependence: Secondary | ICD-10-CM | POA: Diagnosis not present

## 2016-02-01 DIAGNOSIS — R110 Nausea: Secondary | ICD-10-CM

## 2016-02-01 LAB — MYOCARDIAL PERFUSION IMAGING
CHL CUP RESTING HR STRESS: 52 {beats}/min
CSEPPHR: 94 {beats}/min
LV dias vol: 100 mL (ref 46–106)
LVSYSVOL: 35 mL
NUC STRESS TID: 0.9
RATE: 0.27
SDS: 3
SRS: 3
SSS: 6

## 2016-02-01 MED ORDER — REGADENOSON 0.4 MG/5ML IV SOLN
0.4000 mg | Freq: Once | INTRAVENOUS | Status: AC
  Administered 2016-02-01: 0.4 mg via INTRAVENOUS

## 2016-02-01 MED ORDER — AMINOPHYLLINE 25 MG/ML IV SOLN
75.0000 mg | Freq: Once | INTRAVENOUS | Status: AC
  Administered 2016-02-01: 75 mg via INTRAVENOUS

## 2016-02-01 MED ORDER — TECHNETIUM TC 99M TETROFOSMIN IV KIT
31.5000 | PACK | Freq: Once | INTRAVENOUS | Status: AC | PRN
  Administered 2016-02-01: 32 via INTRAVENOUS
  Filled 2016-02-01: qty 32

## 2016-02-01 MED ORDER — TECHNETIUM TC 99M TETROFOSMIN IV KIT
10.5000 | PACK | Freq: Once | INTRAVENOUS | Status: AC | PRN
  Administered 2016-02-01: 11 via INTRAVENOUS
  Filled 2016-02-01: qty 11

## 2016-02-10 ENCOUNTER — Encounter: Payer: Self-pay | Admitting: Neurology

## 2016-02-18 ENCOUNTER — Telehealth: Payer: Self-pay

## 2016-02-18 ENCOUNTER — Encounter: Payer: Self-pay | Admitting: Cardiology

## 2016-02-18 NOTE — Telephone Encounter (Signed)
Theresa Reichert, MD  Theresa Dine, RN        Normal stress test    Informed patient's DPR of results and verbal understanding expressed.

## 2016-02-18 NOTE — Telephone Encounter (Signed)
-----   Message from Quintella Reichert, MD sent at 02/18/2016  1:27 PM EDT ----- Normal stress test ----- Message ----- From: Henrietta Dine, RN Sent: 02/18/2016  12:58 PM To: Quintella Reichert, MD  The patient's daughter sent me a message today asking for results - but I'm not sure they were ever sent to you. Thanks

## 2016-02-25 NOTE — Progress Notes (Signed)
   Neuropsychology Note  Theresa Mendoza returned on 12/23/2015 for 2 hours of neuropsychological testing with technician, Theresa Mendoza, BS, under the supervision of Dr. Elvis Coil. The patient did not appear overtly distressed by the testing session, per behavioral observation or via self-report to the technician. Rest breaks were offered. Theresa Mendoza will return within 2 weeks for a feedback session with Dr. Alinda Dooms at which time her test performances, clinical impressions and treatment recommendations will be reviewed in detail. The patient understands she can contact our office should she require our assistance before this time.  Full report to follow.

## 2016-03-02 NOTE — Progress Notes (Signed)
   Neuropsychology Note  Theresa Mendoza returned today for 2 hours of neuropsychological testing with technician, Wallace Keller, BS, under the supervision of Dr. Elvis Coil. The patient did not appear overtly distressed by the testing session, per behavioral observation or via self-report to the technician. Rest breaks were offered. Theresa Mendoza will return within 2 weeks for a feedback session with Dr. Alinda Dooms at which time her test performances, clinical impressions and treatment recommendations will be reviewed in detail. The patient understands she can contact our office should she require our assistance before this time.  Full report to follow.

## 2016-03-10 ENCOUNTER — Encounter: Payer: Self-pay | Admitting: Psychology

## 2016-03-13 ENCOUNTER — Telehealth: Payer: Self-pay | Admitting: Psychology

## 2016-03-13 NOTE — Progress Notes (Signed)
Attempted to call patient's daughter in law back on 8/28 (see message from Friday 8/25), but received voicemail.   Patient's daughter-in-law presented to the office about two hours later requesting a statement of competency for the patient. She reported that the patient's son is attempting to put her money from the sale of her home into a trust so that it is protected and she can only use it for her needs. Apparently they have been instructed to get a statement of competency in order to do this. I explained that since I last saw the patient in 12/2015 I would not be able to comment on her competency at the present time. Additionally, a competency evaluation would likely need to be completed, which is not covered by insurance. I recommended that she contact the patient's primary care physician to help with this matter, since her PCP has seen her more recently. She will contact us if she has any further questions.

## 2016-03-15 ENCOUNTER — Encounter (HOSPITAL_COMMUNITY): Payer: Self-pay | Admitting: Psychiatry

## 2016-03-15 ENCOUNTER — Ambulatory Visit (INDEPENDENT_AMBULATORY_CARE_PROVIDER_SITE_OTHER): Payer: PPO | Admitting: Psychiatry

## 2016-03-15 VITALS — BP 122/70 | HR 83 | Ht 65.0 in | Wt 150.2 lb

## 2016-03-15 DIAGNOSIS — F331 Major depressive disorder, recurrent, moderate: Secondary | ICD-10-CM

## 2016-03-15 DIAGNOSIS — F339 Major depressive disorder, recurrent, unspecified: Secondary | ICD-10-CM

## 2016-03-15 MED ORDER — CLORAZEPATE DIPOTASSIUM 3.75 MG PO TABS: 3.7500 mg | ORAL_TABLET | Freq: Two times a day (BID) | ORAL | 3 refills | Status: DC | PRN

## 2016-03-15 MED ORDER — DULOXETINE HCL 30 MG PO CPEP: ORAL_CAPSULE | ORAL | 4 refills | Status: DC

## 2016-03-15 MED ORDER — TRAZODONE HCL 100 MG PO TABS: 30.0000 mg | ORAL_TABLET | Freq: Every day | ORAL | 5 refills | Status: DC

## 2016-03-15 NOTE — Progress Notes (Signed)
Psychiatric Initial Adult Assessment   Patient Identification: Theresa Mendoza MRN:  [id number] Date of Evaluation:  03/15/2016 Referral Source: Dr. Allena Katz Chief Complaint:   Visit Diagnosis: No diagnosis found.  History of Present Illness:  This patient is a 70 year old white female with a complex presentation of medical and psychiatric problems. She recently was seen by a neurologist for evaluation for dementia and was determined that she had more of a psychiatric etiology. The patient husband died 7 years ago of lung cancer. He was her fourth husband. The patient recently moved from [place] 6 months ago to [place] to live with her son [name] and his wife [name]. Today [name] was seen in this evaluation. There apparently is a great deal of tension at home. Apparently the patient was failing [place] that she was getting lost driving probably had other issues living independently. Her son therefore persuaded her to move to this setting. The patient says that she has not had any significant psychiatric care for over 20 years. On the other hand she had been in therapy up until a year ago with a therapist for about one year. Approximately 30 years ago she was psychiatrically hospitalized every year for 3 years in a row. Into that. Of hospitalization she then no longer was receiving psychiatric care. At this time the patient denies daily depression. She says she has a significant problem with sleep over the last months. She says she sleeps 2-4 hours and he clearly very affected by her sleep deprivation. The patient describes himself as agitated and very irritable. The patient says she's eating fairly well and has good energy. She denies thinking and concentration problems. In fact the patient says she reads all the time. The patient enjoys music and playing with her cats. At this time the patient is not suicidal. She made 1 suicide attempt about 30 years ago. The patient denies the use of  alcohol or drugs. She clearly denies psychotic symptoms at this time. The patient is a very poor historian. She claims she's been on a variety of medicines and could not tolerate them or they fail but she's not specific about. She said she could not tolerate Prozac yet intact she's taking that now. The patient said she had allergic reaction to Tegretol and had side effects from Depakote. The fact that she's been on these medications makes me think someone thought she had bipolar disorder. Patient had significant medical problems recently. She had an MI. She's being treated for coronary artery disease and COPD. She also has DJD. At this time the patient wishes she did not moved to this community in fact wishes to live independently. It is noted the patient does all her basic ADLs. Presently the patient on Prozac 20 mg and Zyprexa 5 mg. Incidentally noted in the distant past she did take 5 mg of Valium. She's not on any benzodiazepines at this time. The patient also takes 200 mg of trazodone which does not seem to help. In fact she's run out of it a few days ago. In this interview the patient was seen with her daughter-in-law [name].  Associated Signs/Symptoms: Depression Symptoms:  anxiety, (Hypo) Manic Symptoms:  Irritable Mood, Anxiety Symptoms:   Psychotic Symptoms:   PTSD Symptoms:   Past Psychiatric History: 3 psychiatric hospitalizations 30 years ago. The patient also been on a few antidepressants by her primary care doctor years but she's not specific about which ones they were. She suggested that they were probably Zoloft and Prozac.  Previous  Psychotropic Medications: Yes   Substance Abuse History in the last 12 months:  No.  Consequences of Substance Abuse: Negative  Past Medical History:  Past Medical History:  Diagnosis Date  . Anxiety   . Bipolar 1 disorder (HCC)   . Bowel obstruction (HCC)   . Cataract   . Chronic atrial fibrillation (HCC) 09/2014   On chronic anticoagulation  with coumadin.  She has failed several DCCVs in the past.  . COPD (chronic obstructive pulmonary disease) (HCC)   . Dyslipidemia, goal LDL below 70 12/25/2015  . Glaucoma   . Hx of migraine headaches   . Memory loss   . Mitral regurgitation    mild to moderate on echo 09/2014  . Old MI (myocardial infarction) 09/2014   cath with normal coronary arteries and EF 50-55%  . Osteoarthritis   . Pulmonary HTN (HCC) 09/2014   Mild with PASP at cath  . RBBB   . Rheumatoid arthritis (HCC)   . Tobacco abuse     Past Surgical History:  Procedure Laterality Date  . ADENOIDECTOMY    . BACK SURGERY    . bowel obstruction    . CARDIAC CATHETERIZATION  09/2014   normal coronary arteries with mild pulmonary HTN, low normal LVF  . CHOLECYSTECTOMY    . NOSE SURGERY    . TONSILLECTOMY    . TRANSESOPHAGEAL ECHOCARDIOGRAM WITH CARDIOVERSION  09/2014  . VAGINAL HYSTERECTOMY     with BSO    Family Psychiatric History:   Family History:  Family History  Problem Relation Age of Onset  . Tuberculosis Father   . Depression Father   . Alzheimer's disease Mother   . Lung cancer Mother     Social History:   Social History   Social History  . Marital status: Widowed    Spouse name: N/A  . Number of children: N/A  . Years of education: N/A   Social History Main Topics  . Smoking status: Current Every Day Smoker    Packs/day: 2.00    Years: 55.00    Types: Cigarettes  . Smokeless tobacco: Never Used  . Alcohol use No  . Drug use: No  . Sexual activity: No   Other Topics Concern  . None   Social History Narrative   Lives with son and daughter in law in a 2 story home.  Has 1 son.  Retired from [employer].  Education: high school.  Widowed.    Additional Social History:   Allergies:   Allergies  Allergen Reactions  . Lithium     Patient became suicidal  . Tegretol [Carbamazepine]     Metabolic Disorder Labs: No results found for: HGBA1C, MPG No results found for: PROLACTIN No  results found for: CHOL, TRIG, HDL, CHOLHDL, VLDL, LDLCALC   Current Medications: Current Outpatient Prescriptions  Medication Sig Dispense Refill  . atenolol (TENORMIN) 25 MG tablet Take 25 mg by mouth daily.  0  . clorazepate (TRANXENE-T) 3.75 MG tablet Take 1 tablet (3.75 mg total) by mouth 2 (two) times daily as needed for anxiety. 60 tablet 3  . diazepam (VALIUM) 5 MG tablet TAKE ONE TABLET BY MOUTH 3 TIMES A DAY AS NEEDED FOR SEVERE ANXIETY  1  . digoxin (LANOXIN) 0.25 MG tablet Take 0.25 mg by mouth daily.    Marland Kitchen diltiazem (CARDIZEM CD) 240 MG 24 hr capsule Take 240 mg by mouth daily.  1  . DULoxetine (CYMBALTA) 30 MG capsule 1 qam 30 capsule 4  .  fentaNYL (DURAGESIC - DOSED MCG/HR) 100 MCG/HR Place 100 mcg onto the skin every 3 (three) days.    . furosemide (LASIX) 40 MG tablet Take 40 mg by mouth daily.  1  . Magnesium Oxide 400 (240 Mg) MG TABS Take 1 tablet by mouth 2 (two) times daily.  1  . Melatonin 10 MG TABS Take 1 tablet by mouth at bedtime as needed (sleep).     . ondansetron (ZOFRAN) 4 MG tablet Take 4 mg by mouth every 8 (eight) hours as needed for nausea or vomiting.    . pantoprazole (PROTONIX) 40 MG tablet Take 40 mg by mouth daily.  12  . polyethylene glycol powder (GLYCOLAX/MIRALAX) powder MIX 1 CAPFUL IN WATER AND DRINK DAILY  12  . promethazine (PHENERGAN) 25 MG tablet TAKE 1/2 TABLET BY MOUTH EVERY 6 TO 8 HOURS AS NEEDED FOR NAUSEA  1  . traZODone (DESYREL) 100 MG tablet Take 0.5 tablets (50 mg total) by mouth at bedtime. 90 tablet 5  . warfarin (COUMADIN) 5 MG tablet TAKE 1 TABLET BY MOUTH DAILY AT 4PM  3  . Wheat Dextrin (BENEFIBER PO) Take by mouth as directed.     No current facility-administered medications for this visit.     Neurologic: Headache: No Seizure: No Paresthesias:No  Musculoskeletal: Strength & Muscle Tone: within normal limits Gait & Station: normal Patient leans: N/A  Psychiatric Specialty Exam: ROS  Blood pressure 122/70, pulse 83,  height 5\' 5"  (1.651 m), weight 150 lb 3.2 oz (68.1 kg).Body mass index is 24.99 kg/m.  General Appearance: Casual  Eye Contact:  Good  Speech:  Clear and Coherent  Volume:  Normal  Mood:  Anxious  Affect:  Congruent  Thought Process:  Disorganized and Goal Directed  Orientation:  Negative  Thought Content:  Logical  Suicidal Thoughts:  No  Homicidal Thoughts:  No  Memory:  NA  Judgement:  Fair  Insight:  Lacking  Psychomotor Activity:  NA and Normal  Concentration:  Concentration: Fair  Recall:  Poor  Fund of Knowledge:Poor  Language: Good  Akathisia:  No  Handed:  Right  AIMS (if indicated):    Assets:  Desire for Improvement  ADL's:  Intact  Cognition: WNL  Sleep:      Treatment Plan Summary:  At this time the patient has a number of psychiatric issues. The most likely diagnosis is patient has is major depression which explains why she has somatic complaints pain as well as intense irritability. I think she has an agitated depression. I do not think she is mania. The patient shows no evidence of psychosis. My interventions are to begin her on Cymbalta and low dose 30 mg. She's never been on Cymbalta in the possibility of using this medicine for pain could be considered in the future. I've asked the patient is discontinue her Prozac. The patient is been on Prozac and Zyprexa for approximately one month with no clear benefits. We will begin her on Tranxene 3.75 mg twice a day and ask her to increase her trazodone from a dose of 200 mg to a higher dose of 300 mg. The most important intervention however will be to get her therapist. I think the therapist ultimately will involve the patient's son and daughter-in-law. It should be noted the patient I believe had a neuropsych testing and we'll review that testing. Her neurologist leaves her major problem is not a memory problem as a primary issue. I do think there is a possibility that her  cognitive problems are related to being clinically  depressed. It is hard to imagine that she is a significant memory impairment when she reads and concentrate quite well. I do think this patient generally fabricates. It should be noted the patient is a poor historian but the patient absolutely denies the use of alcohol or drugs. It should also be noted that the patient made a suicide attempt 30 years ago and repeatedly was hospitalized. I think the most likely diagnosis is a mood disorder probably that of major depression. This patient return to see me in 7 weeks. Lucas MallowGerald Irving Dianna Deshler, MD 8/30/20173:19 PM

## 2016-03-21 ENCOUNTER — Ambulatory Visit (INDEPENDENT_AMBULATORY_CARE_PROVIDER_SITE_OTHER): Payer: PPO | Admitting: Clinical

## 2016-03-21 DIAGNOSIS — F3130 Bipolar disorder, current episode depressed, mild or moderate severity, unspecified: Secondary | ICD-10-CM

## 2016-03-23 ENCOUNTER — Encounter (HOSPITAL_COMMUNITY): Payer: Self-pay | Admitting: Clinical

## 2016-03-23 ENCOUNTER — Ambulatory Visit: Payer: Medicare Other | Admitting: Neurology
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
## 2016-03-31 ENCOUNTER — Ambulatory Visit: Payer: PPO | Admitting: Neurology

## 2016-04-04 ENCOUNTER — Ambulatory Visit (INDEPENDENT_AMBULATORY_CARE_PROVIDER_SITE_OTHER): Payer: PPO | Admitting: Clinical

## 2016-04-04 ENCOUNTER — Telehealth (HOSPITAL_COMMUNITY): Payer: Self-pay

## 2016-04-04 DIAGNOSIS — F3130 Bipolar disorder, current episode depressed, mild or moderate severity, unspecified: Secondary | ICD-10-CM | POA: Diagnosis not present

## 2016-04-04 NOTE — Progress Notes (Signed)
   THERAPIST PROGRESS NOTE  Session Time: 3:30 -4:28  Participation Level: Active  Behavioral Response: CasualAlertDepressed  Type of Therapy: Individual Therapy  Treatment Goals addressed: improve psychiatric symptoms, , improve unhelpful thought patterns, deliberate thought (improved social functioning), healthy coping skills  Interventions: CBT, Motivational Interviewing, psychoeducation, grounding and mindfulness techniques  Summary: Theresa Mendoza is a 70 y.o. female who presents with Bipolar I disorder, most recent episode depressed.   Suicidal/Homicidal: No   without intent/plan  Therapist Response: Vaughan Basta met with clinician for an individual therapy session. [name] discussed her psychiatric symptoms, her current life events, and her goals for therapy. Mekayla shared that she was depressed and would like to gain the skills needed to keep her from being depressed. Client and clinician discussed her current life events, some of the changes she is making or would like to make. Clinician introduced some basic cbt concepts. Client and clinician discussed the thought emotion connection. Clinician gave [name] a homework packet on bipolar which she agreed to complete before next session. Clinician introduced grounding and mindfulness techniques. Clinician explained the process, purpose, and practice of the techniques. Client and clinician practiced a technique together.   Plan: Return again in 1-2 weeks.  Diagnosis: Axis I: Bipolar I disorder, most recent episode depressed      Elona Yinger A, LCSW 04/04/2016

## 2016-04-04 NOTE — Telephone Encounter (Signed)
Patient came in to see Theresa Mendoza and stopped in my office to discuss Trazodone. Patient states that it is having an opposite effect on her. Patient says that when she takes it she gets hyper and cannot sleep. Patient has stopped the Trazodone and would like to know if you can try something different for sleep.Please review and advise, thank you

## 2016-04-12 ENCOUNTER — Encounter (HOSPITAL_COMMUNITY): Payer: Self-pay | Admitting: Clinical

## 2016-04-14 ENCOUNTER — Other Ambulatory Visit (HOSPITAL_COMMUNITY): Payer: Self-pay

## 2016-04-14 MED ORDER — DOXEPIN HCL 25 MG PO CAPS: 25.0000 mg | ORAL_CAPSULE | Freq: Every day | ORAL | 2 refills | Status: DC

## 2016-04-14 NOTE — Progress Notes (Signed)
Please see previous phone message.  

## 2016-04-14 NOTE — Telephone Encounter (Signed)
Per Dr. Donell Beers, I sent an order for Doxepin 25 mg to the pharmacy. I called patient to let her know

## 2016-04-20 ENCOUNTER — Ambulatory Visit (HOSPITAL_COMMUNITY): Payer: Self-pay | Admitting: Clinical

## 2016-04-21 ENCOUNTER — Telehealth (HOSPITAL_COMMUNITY): Payer: Self-pay | Admitting: *Deleted

## 2016-04-21 NOTE — Telephone Encounter (Signed)
Prior authorization for Doxepin received. Tried to submit online with cover my meds, was told to call [phone]. Spoke with Fritzi Mandes who states it is covered and they received a paid claim on 10/6.

## 2016-05-03 ENCOUNTER — Ambulatory Visit (HOSPITAL_COMMUNITY): Payer: Self-pay | Admitting: Psychiatry

## 2016-05-11 ENCOUNTER — Ambulatory Visit (HOSPITAL_COMMUNITY): Payer: Self-pay | Admitting: Clinical

## 2016-05-11 ENCOUNTER — Ambulatory Visit (INDEPENDENT_AMBULATORY_CARE_PROVIDER_SITE_OTHER): Payer: PPO | Admitting: Clinical

## 2016-05-11 DIAGNOSIS — F3130 Bipolar disorder, current episode depressed, mild or moderate severity, unspecified: Secondary | ICD-10-CM

## 2016-05-11 NOTE — Progress Notes (Signed)
   THERAPIST PROGRESS NOTE  Session Time: 9:00 -9:55  Participation Level: Active  Behavioral Response: CasualAlertNA  Type of Therapy: Individual Therapy  Treatment Goals addressed: improve psychiatric symptoms, elevate mood ( decreased isolation, increased social interaction), improve unhelpful thought patterns, implement interpersonal relationship (increased attentiveness, more honest communication, decreased anger), controlled behavior, moderated mood, deliberate speech, deliberate thought (improved social functioning), learn about diagnosis, healthy coping skills  Interventions: CBT, Motivational Interviewing, psychoeducation, grounding and mindfulness techniques, psychoeducation  Summary: Sunya Humbarger is a 70 y.o. female who presents with Bipolar I disorder, most recent episode depressed.   Suicidal/Homicidal: No   without intent/plan  Therapist Response: Vaughan Basta met with clinician for an individual therapy session. Myrna discussed her psychiatric symptoms, her current life events, and her goals for therapy. Billee shared that she has been a little better since last sessions. She shared that she has been practicing her grounding and mindfulness techniques and has found them very useful. Clinician asked open ended questions. Amazing identified her success and challenged with the mindfulness and grounding techniques. Client and clinician discussed how she could continue to use them and use some more successfully. She shared that she is been sleeping better. She shared that she has been working to change her thoughts which has helped her relationship with her son and his wife. Makayle shared that she is now eating dinner with them and not isolating as much though it is still a challenge. Client and clinician discussed some of the negative thoughts that she continues to have. Client and clinician used a 7 panel thought record sheet. Liset identified events that triggered her negative thoughts.  Cherolyn identified her emotions and rated them. Delano identified the evidence for and against these thoughts. Aubriauna was able to formulate healthier alternative thoughts. Georgia had completed packet 1 and 2 on bipolar. Client and clinician reviewed and discussed her thoughts and insights from the homework. Clinician gave her packet 3 which she agreed to complete before next session. Kaelin agreed to continue practicing her grounding and mindfulness techniques.    Plan: Return again in 1-2 weeks.  Diagnosis: Axis I: Bipolar I disorder, most recent episode depressed     Brittanyann Wittner A, LCSW 05/11/2016

## 2016-05-16 ENCOUNTER — Encounter (HOSPITAL_COMMUNITY): Payer: Self-pay | Admitting: Psychiatry

## 2016-05-16 ENCOUNTER — Ambulatory Visit (INDEPENDENT_AMBULATORY_CARE_PROVIDER_SITE_OTHER): Payer: PPO | Admitting: Psychiatry

## 2016-05-16 VITALS — BP 120/70 | HR 72 | Ht 66.0 in | Wt 161.8 lb

## 2016-05-16 DIAGNOSIS — F17210 Nicotine dependence, cigarettes, uncomplicated: Secondary | ICD-10-CM

## 2016-05-16 DIAGNOSIS — F3342 Major depressive disorder, recurrent, in full remission: Secondary | ICD-10-CM | POA: Diagnosis not present

## 2016-05-16 DIAGNOSIS — Z79899 Other long term (current) drug therapy: Secondary | ICD-10-CM

## 2016-05-16 DIAGNOSIS — Z801 Family history of malignant neoplasm of trachea, bronchus and lung: Secondary | ICD-10-CM

## 2016-05-16 DIAGNOSIS — Z818 Family history of other mental and behavioral disorders: Secondary | ICD-10-CM

## 2016-05-16 MED ORDER — DOXEPIN HCL 25 MG PO CAPS: 25.0000 mg | ORAL_CAPSULE | Freq: Every day | ORAL | 5 refills | Status: DC

## 2016-05-16 MED ORDER — DULOXETINE HCL 30 MG PO CPEP: ORAL_CAPSULE | ORAL | 5 refills | Status: DC

## 2016-05-16 MED ORDER — DULOXETINE HCL 30 MG PO CPEP: ORAL_CAPSULE | ORAL | 4 refills | Status: DC

## 2016-05-16 MED ORDER — CLORAZEPATE DIPOTASSIUM 3.75 MG PO TABS: 3.7500 mg | ORAL_TABLET | Freq: Two times a day (BID) | ORAL | 5 refills | Status: DC | PRN

## 2016-05-16 NOTE — Progress Notes (Signed)
Psychiatric Initial Adult Assessment   Patient Identification: Theresa Mendoza MRN:  [id number] Date of Evaluation:  05/16/2016 Referral Source: Dr. Allena Katz Chief Complaint:   Visit Diagnosis: No diagnosis found.  History of Present Illness:    today the patient shares that she feels dramatically improved. She says she's never felt so well. She said the first time at age 70 she feels not depressed is getting along great with her family and is sleeping back to normal. Her energy level is much improved. The patient is taking Cymbalta 30 mg and she is benefiting by taking Tranxene 3.75 mg twice a day. The patient is gotten doxepin from her primary care doctor and is working well. She takes 25 mg at night. The patient does not drink any alcohol use any drugs. She is much more well adjusted. Remarkably her back pain seems to be gone at this time. There was not a clear statement about her Zyprexa which I wish to be gone as well. Patient was instructed to tell her son who puts out her medicine to discontinue the Zyprexa if she still taking it. Overall she dramatically improved. She feels very positive and optimistic. She is not suicidal. Depression Symptoms:  anxiety, (Hypo) Manic Symptoms:  Irritable Mood, Anxiety Symptoms:   Psychotic Symptoms:   PTSD Symptoms:   Past Psychiatric History: 3 psychiatric hospitalizations 30 years ago. The patient also been on a few antidepressants by her primary care doctor years but she's not specific about which ones they were. She suggested that they were probably Zoloft and Prozac.  Previous Psychotropic Medications: Yes   Substance Abuse History in the last 12 months:  No.  Consequences of Substance Abuse: Negative  Past Medical History:  Past Medical History:  Diagnosis Date  . Anxiety   . Bipolar 1 disorder (HCC)   . Bowel obstruction   . Cataract   . Chronic atrial fibrillation (HCC) 09/2014   On chronic anticoagulation with coumadin.  She has  failed several DCCVs in the past.  . COPD (chronic obstructive pulmonary disease) (HCC)   . Dyslipidemia, goal LDL below 70 12/25/2015  . Glaucoma   . Hx of migraine headaches   . Memory loss   . Mitral regurgitation    mild to moderate on echo 09/2014  . Old MI (myocardial infarction) 09/2014   cath with normal coronary arteries and EF 50-55%  . Osteoarthritis   . Pulmonary HTN 09/2014   Mild with PASP at cath  . RBBB   . Rheumatoid arthritis (HCC)   . Tobacco abuse     Past Surgical History:  Procedure Laterality Date  . ADENOIDECTOMY    . BACK SURGERY    . bowel obstruction    . CARDIAC CATHETERIZATION  09/2014   normal coronary arteries with mild pulmonary HTN, low normal LVF  . CHOLECYSTECTOMY    . NOSE SURGERY    . TONSILLECTOMY    . TRANSESOPHAGEAL ECHOCARDIOGRAM WITH CARDIOVERSION  09/2014  . VAGINAL HYSTERECTOMY     with BSO    Family Psychiatric History:   Family History:  Family History  Problem Relation Age of Onset  . Tuberculosis Father   . Depression Father   . Alzheimer's disease Mother   . Lung cancer Mother     Social History:   Social History   Social History  . Marital status: Widowed    Spouse name: N/A  . Number of children: N/A  . Years of education: N/A   Social History  Main Topics  . Smoking status: Current Every Day Smoker    Packs/day: 2.00    Years: 55.00    Types: Cigarettes  . Smokeless tobacco: Never Used  . Alcohol use No  . Drug use: No  . Sexual activity: No   Other Topics Concern  . None   Social History Narrative   Lives with son and daughter in law in a 2 story home.  Has 1 son.  Retired from [employer].  Education: high school.  Widowed.    Additional Social History:   Allergies:   Allergies  Allergen Reactions  . Lithium     Patient became suicidal  . Tegretol [Carbamazepine]     Metabolic Disorder Labs: No results found for: HGBA1C, MPG No results found for: PROLACTIN No results found for: CHOL, TRIG,  HDL, CHOLHDL, VLDL, LDLCALC   Current Medications: Current Outpatient Prescriptions  Medication Sig Dispense Refill  . atenolol (TENORMIN) 25 MG tablet Take 25 mg by mouth daily.  0  . clorazepate (TRANXENE-T) 3.75 MG tablet Take 1 tablet (3.75 mg total) by mouth 2 (two) times daily as needed for anxiety. 60 tablet 5  . diazepam (VALIUM) 5 MG tablet TAKE ONE TABLET BY MOUTH 3 TIMES A DAY AS NEEDED FOR SEVERE ANXIETY  1  . digoxin (LANOXIN) 0.25 MG tablet Take 0.25 mg by mouth daily.    Marland Kitchen diltiazem (CARDIZEM CD) 240 MG 24 hr capsule Take 240 mg by mouth daily.  1  . doxepin (SINEQUAN) 25 MG capsule Take 1 capsule (25 mg total) by mouth at bedtime. 30 capsule 5  . DULoxetine (CYMBALTA) 30 MG capsule 1 qam 30 capsule 5  . DULoxetine (CYMBALTA) 30 MG capsule 1 qam 30 capsule 4  . fentaNYL (DURAGESIC - DOSED MCG/HR) 100 MCG/HR Place 100 mcg onto the skin every 3 (three) days.    . furosemide (LASIX) 40 MG tablet Take 40 mg by mouth daily.  1  . HYDROcodone-acetaminophen (NORCO) 10-325 MG tablet Take 1 tablet by mouth 3 (three) times daily.    . Magnesium Oxide 400 (240 Mg) MG TABS Take 1 tablet by mouth 2 (two) times daily.  1  . Melatonin 10 MG TABS Take 1 tablet by mouth at bedtime as needed (sleep).     . ondansetron (ZOFRAN) 4 MG tablet Take 4 mg by mouth every 8 (eight) hours as needed for nausea or vomiting.    . pantoprazole (PROTONIX) 40 MG tablet Take 40 mg by mouth daily.  12  . polyethylene glycol powder (GLYCOLAX/MIRALAX) powder MIX 1 CAPFUL IN WATER AND DRINK DAILY  12  . promethazine (PHENERGAN) 25 MG tablet TAKE 1/2 TABLET BY MOUTH EVERY 6 TO 8 HOURS AS NEEDED FOR NAUSEA  1  . warfarin (COUMADIN) 5 MG tablet TAKE 1 TABLET BY MOUTH DAILY AT 4PM  3  . Wheat Dextrin (BENEFIBER PO) Take by mouth as directed.     No current facility-administered medications for this visit.     Neurologic: Headache: No Seizure: No Paresthesias:No  Musculoskeletal: Strength & Muscle Tone: within  normal limits Gait & Station: normal Patient leans: N/A  Psychiatric Specialty Exam: ROS  Blood pressure 120/70, pulse 72, height 5\' 6"  (1.676 m), weight 161 lb 12.8 oz (73.4 kg).Body mass index is 26.12 kg/m.  General Appearance: Casual  Eye Contact:  Good  Speech:  Clear and Coherent  Volume:  Normal  Mood:  Anxious  Affect:  Congruent  Thought Process:  Disorganized and Goal Directed  Orientation:  Negative  Thought Content:  Logical  Suicidal Thoughts:  No  Homicidal Thoughts:  No  Memory:  NA  Judgement:  Fair  Insight:  Lacking  Psychomotor Activity:  NA and Normal  Concentration:  Concentration: Fair  Recall:  Poor  Fund of Knowledge:Poor  Language: Good  Akathisia:  No  Handed:  Right  AIMS (if indicated):    Assets:  Desire for Improvement  ADL's:  Intact  Cognition: WNL  Sleep:     Lucas Mallow, MD 10/31/20175:07 PM  At this time the patient is doing extremely well. She clearly is benefit by being on the low dose of Cymbalta 30 mg might be helping her pain to some degree. He clearly is helping her depression. Patient is off Prozac without problems. I wish her to the office Zyprexa. If she still taking it either*discontinue. She continues taking Tranxene 3.75 mg twice a day which seems to be working well. She sleeping well taking doxepin 25 mg. The patient is off her trazodone. The patient continues in therapywith [place]. Patient seems to be doing much better. She is very stable. She's not suicidal. Medically she feels well.

## 2016-05-17 ENCOUNTER — Other Ambulatory Visit (HOSPITAL_COMMUNITY): Payer: Self-pay

## 2016-05-24 ENCOUNTER — Ambulatory Visit: Payer: PPO | Admitting: Neurology

## 2016-06-02 ENCOUNTER — Encounter: Payer: Self-pay | Admitting: Neurology

## 2016-06-06 ENCOUNTER — Ambulatory Visit (INDEPENDENT_AMBULATORY_CARE_PROVIDER_SITE_OTHER): Payer: PPO | Admitting: Clinical

## 2016-06-06 DIAGNOSIS — F3130 Bipolar disorder, current episode depressed, mild or moderate severity, unspecified: Secondary | ICD-10-CM | POA: Diagnosis not present

## 2016-06-06 NOTE — Progress Notes (Signed)
   THERAPIST PROGRESS NOTE  Session Time: 12:00 - 12:53  Participation Level: Active  Behavioral Response: CasualAlertDepressed  Type of Therapy: Individual Therapy  Treatment Goals addressed: improve psychiatric symptoms, elevate mood ( decreased isolation, increased social interaction, increased self-esteem), improve unhelpful thought patterns, implement interpersonal relationship, controlled behavior, moderated mood, deliberate thought (improved social functioning),  healthy coping skills  Interventions: CBT, Motivational Interviewing, psychoeducation, grounding and mindfulness techniques  Summary: Theresa Mendoza is a 70 y.o. female who presents with Bipolar I disorder, most recent episode depressed.   Suicidal/Homicidal: No   without intent/plan  Therapist Response: Theresa Mendoza met with clinician for an individual therapy session. [name] discussed her psychiatric symptoms and  her current life events. [name] shared that she has been struggling emotionally. She shared about an event that happened with her son and that he had said some very cruel things. Clinician asked open ended questions about the things he said and her thoughts and beliefs about them. Client and clinician discussed the evidence for and against the thoughts and beliefs. [name] recognized that she would not have those beliefs if the things said were about another. Client and clinician discussed this insight and how her emotions might change if she believed healthier alternative thoughts.Client and clinician discussed setting healthy boundaries. Client and clinician discussed steps she could take to improve her emotions and situations such as having friends outside the home.   Plan: Return again in 1-2 weeks.  Diagnosis: Axis I: Bipolar I disorder, most recent episode depressed    Theresa Mendoza A, LCSW 06/06/2016

## 2016-06-12 ENCOUNTER — Encounter (HOSPITAL_COMMUNITY): Payer: Self-pay | Admitting: Clinical

## 2016-06-20 ENCOUNTER — Telehealth (HOSPITAL_COMMUNITY): Payer: Self-pay

## 2016-06-20 DIAGNOSIS — F3342 Major depressive disorder, recurrent, in full remission: Secondary | ICD-10-CM

## 2016-06-20 MED ORDER — DOXEPIN HCL 25 MG PO CAPS: 25.0000 mg | ORAL_CAPSULE | Freq: Every day | ORAL | 1 refills | Status: DC

## 2016-06-20 NOTE — Telephone Encounter (Signed)
Medication refill request - Fax received from patient's [facility] for 90 day orders for her Dxoepin.  Discussed with Dr. Ladona Ridgel helping to cover for Dr. Donell Beers who authorized a new 90 day order plus one refill for patient. New order e-scribed to patient's [facility] on [facility] as approved by Dr. Ladona Ridgel.

## 2016-06-26 ENCOUNTER — Telehealth (HOSPITAL_COMMUNITY): Payer: Self-pay

## 2016-06-26 NOTE — Telephone Encounter (Signed)
Patients Medical Power of Gerrit Friends - Deri Fuelling called to let you know that since stopping the Zyprexa patient has become more depressed. She reports patient is sleeping all day and has lost interest in daily activities. She said that she usually comes in to the patients appointments as the patient is not a very good historian. She was unclear as to why the Zyprexa was stopped and would like to know if we can resume it. Please review and advise, thank you

## 2016-06-29 MED ORDER — OLANZAPINE 5 MG PO TABS: 5.0000 mg | ORAL_TABLET | Freq: Every day | ORAL | 0 refills | Status: DC

## 2016-06-29 NOTE — Telephone Encounter (Signed)
I spoke with Dr. Donell Beers and he said to restart the Zyprexa. I called patients medical POA and let her know that I had sent the prescription to the pharmacy

## 2016-07-06 ENCOUNTER — Ambulatory Visit (INDEPENDENT_AMBULATORY_CARE_PROVIDER_SITE_OTHER): Payer: PPO | Admitting: Clinical

## 2016-07-06 DIAGNOSIS — F3130 Bipolar disorder, current episode depressed, mild or moderate severity, unspecified: Secondary | ICD-10-CM

## 2016-07-06 NOTE — Progress Notes (Signed)
   THERAPIST PROGRESS NOTE  Session Time: 2:36 - 3:30  Participation Level: Active  Behavioral Response: CasualAlertDepressed  Type of Therapy: Individual Therapy  Treatment Goals addressed: improve psychiatric symptoms, elevate mood, improve unhelpful thought patterns, implement interpersonal relationship , healthy coping skills  Interventions: CBT, Motivational Interviewing,  Summary: Theresa Mendoza is a 70 y.o. female who presents with Bipolar I disorder, most recent episode depressed.   Suicidal/Homicidal: No   without intent/plan  Therapist Response: Vaughan Basta met with clinician for an individual therapy session. Avaya discussed her psychiatric symptoms and her current life events. Jill shared that she was experiencing a lot of depression. She shared that she feels isolated and alone. She shared that she does not have much social interaction. Client and clinician discussed beginning to attend the senior center. Clinician asked open ended questions and Sheritha identified the benefits of doing so. She recognized the benefits of having friends her own age to interact with. She shared she would like to go see family but her son is worried she will have a "spell" clinician asked open ended questions about her "spells". Eleri shared that she does not know what he means other than prior to her moving she had gotten lost. Client and clinician discussed healthy interpersonal relationship skills. Clinician asked open ended questions and Trinidi identified what was in her power to change (her thoughts and actions) and what was not (others). Clinician asked open ended questions and Jolea identified thoughts and actions that would improve her mood and experience.   Plan: Return again in 1-2 weeks.  Diagnosis: Axis I: Bipolar I disorder, most recent episode depressed    Milaina Sher A, LCSW 07/06/2016

## 2016-07-08 ENCOUNTER — Other Ambulatory Visit (HOSPITAL_COMMUNITY): Payer: Self-pay | Admitting: Psychiatry

## 2016-08-03 ENCOUNTER — Ambulatory Visit (HOSPITAL_COMMUNITY): Payer: Self-pay | Admitting: Clinical

## 2016-08-09 DIAGNOSIS — G894 Chronic pain syndrome: Secondary | ICD-10-CM | POA: Diagnosis not present

## 2016-08-09 DIAGNOSIS — Z7901 Long term (current) use of anticoagulants: Secondary | ICD-10-CM | POA: Diagnosis not present

## 2016-08-09 DIAGNOSIS — R079 Chest pain, unspecified: Secondary | ICD-10-CM | POA: Diagnosis not present

## 2016-08-09 DIAGNOSIS — J441 Chronic obstructive pulmonary disease with (acute) exacerbation: Secondary | ICD-10-CM | POA: Diagnosis not present

## 2016-08-21 ENCOUNTER — Ambulatory Visit (INDEPENDENT_AMBULATORY_CARE_PROVIDER_SITE_OTHER): Payer: Medicare Other | Admitting: Clinical

## 2016-08-21 DIAGNOSIS — R4689 Other symptoms and signs involving appearance and behavior: Secondary | ICD-10-CM | POA: Diagnosis not present

## 2016-08-21 DIAGNOSIS — R4189 Other symptoms and signs involving cognitive functions and awareness: Secondary | ICD-10-CM | POA: Diagnosis not present

## 2016-08-21 DIAGNOSIS — F3130 Bipolar disorder, current episode depressed, mild or moderate severity, unspecified: Secondary | ICD-10-CM | POA: Diagnosis not present

## 2016-08-21 NOTE — Progress Notes (Signed)
   THERAPIST PROGRESS NOTE  Session Time: 4:30 -5:15  Participation Level: Active  Behavioral Response: CasualAlertDepressed  Type of Therapy: Individual Therapy  Treatment Goals addressed: improve psychiatric symptoms, elevate mood ( decreased isolation, increased social interaction,), improve unhelpful thought patterns, implement interpersonal relationship (increased attentiveness, more honest communication,), controlled behavior, moderated mood, deliberate speech, deliberate thought (improved social functioning),  healthy coping skills  Interventions: CBT, Motivational Interviewing,   Summary: Theresa Mendoza is a 71 y.o. female who presents with Bipolar I disorder, most recent episode depressed.   Suicidal/Homicidal: No   without intent/plan  Therapist Response: Theresa Mendoza met with clinician for an individual therapy session. Theresa Mendoza discussed her psychiatric symptoms, her current life events. Theresa Mendoza shared that she has been experiencing a lot of depression. She shared that it is challenging to live with her son and daughter-in-law. She shared that she is social but does not get enough socialization in the house. Her son and daughter-in-law are more introverted. She shared that she loves them both but the situation  is challenging for all. She shared that she would like to participate at the senior center and [facility] not have her own [facility] and is reliant on her daughter-in-law for transportation. Theresa Mendoza shared that her daughter in laws feelings were hurt by something she said. Clinician asked open ended questions and Theresa Mendoza shared a bout the situation. [name] shared that she did not intend in any way to her daughter-in-law's feelings but now Theresa Mendoza feels sad and more isolated. Clinician asked open ended questions and [name] shared her negative automatic thoughts, the evidence for and against the thoughts, and healthier alternative thoughts. Clinician asked open ended questions a  bout solutions to improve her relationships and socialization. Theresa Mendoza left the session early because her son brought her to the session and she did not want to upset him by making him wait.  Plan: Return again in 1-2 weeks.  Diagnosis: Axis I: Bipolar I disorder, most recent episode depressed    Joyclyn Plazola A, LCSW 08/21/2016

## 2016-08-23 ENCOUNTER — Encounter (HOSPITAL_COMMUNITY): Payer: Self-pay | Admitting: Clinical

## 2016-08-24 ENCOUNTER — Ambulatory Visit (INDEPENDENT_AMBULATORY_CARE_PROVIDER_SITE_OTHER)
Admission: RE | Admit: 2016-08-24 | Discharge: 2016-08-24 | Disposition: A | Discharge status: Home / Self Care | Payer: Medicare Other | Source: Ambulatory Visit | Attending: Pulmonary Disease | Admitting: Pulmonary Disease

## 2016-08-24 ENCOUNTER — Ambulatory Visit (INDEPENDENT_AMBULATORY_CARE_PROVIDER_SITE_OTHER): Payer: Medicare Other | Admitting: Pulmonary Disease

## 2016-08-24 ENCOUNTER — Encounter: Payer: Self-pay | Admitting: Pulmonary Disease

## 2016-08-24 VITALS — BP 120/70 | HR 65 | Ht 66.0 in | Wt 167.8 lb

## 2016-08-24 DIAGNOSIS — F17200 Nicotine dependence, unspecified, uncomplicated: Secondary | ICD-10-CM

## 2016-08-24 DIAGNOSIS — J441 Chronic obstructive pulmonary disease with (acute) exacerbation: Secondary | ICD-10-CM

## 2016-08-24 DIAGNOSIS — J439 Emphysema, unspecified: Secondary | ICD-10-CM | POA: Diagnosis not present

## 2016-08-24 DIAGNOSIS — J42 Unspecified chronic bronchitis: Secondary | ICD-10-CM | POA: Insufficient documentation

## 2016-08-24 DIAGNOSIS — J410 Simple chronic bronchitis: Secondary | ICD-10-CM

## 2016-08-24 DIAGNOSIS — J449 Chronic obstructive pulmonary disease, unspecified: Secondary | ICD-10-CM | POA: Insufficient documentation

## 2016-08-24 DIAGNOSIS — G4710 Hypersomnia, unspecified: Secondary | ICD-10-CM

## 2016-08-24 DIAGNOSIS — Z23 Encounter for immunization: Secondary | ICD-10-CM

## 2016-08-24 MED ORDER — ALBUTEROL SULFATE HFA 108 (90 BASE) MCG/ACT IN AERS: 2.0000 | INHALATION_SPRAY | Freq: Four times a day (QID) | RESPIRATORY_TRACT | 3 refills | Status: DC | PRN

## 2016-08-24 MED ORDER — FLUTICASONE-UMECLIDIN-VILANT 100-62.5-25 MCG/INH IN AEPB: 1.0000 | INHALATION_SPRAY | Freq: Every day | RESPIRATORY_TRACT | 3 refills | Status: DC

## 2016-08-24 NOTE — Patient Instructions (Signed)
It was a pleasure taking care of you today!  You are diagnosed with Chronic Obstructive Pulmonary Disease or COPD.  COPD is a preventable and treatable disease that makes it difficult to empty air out of the lungs (airflow obstruction).  This can lead to shortness of breath.   Sometimes, when you have a lung infection, this can make your breathing worse, and will cause you to have a COPD flare-up or an acute exacerbation of COPD. Please call your primary care doctor or the office if you are having a COPD flare-up.   Smoking makes COPD worse.   Make sure you use your medications for COPD -- Maintenance medications : Trelegy 1 puff daily.   Rescue medications: Albuterol 2 puffs every 4 hours as needed for shortness of breath.   Please rinse your mouth each time you use your maintenance medication (Trelegy).  Please call the office if you are having issues with your medications  We will get a chest x-ray, blood work, breathing tests, sleep study.  Return to clinic in 2 mos with Dr. Christene Slates

## 2016-08-24 NOTE — Assessment & Plan Note (Signed)
Patient with hypersomnia, snoring, fatigue, gasping. She almost daily. Very tired and fatigued. ESS 13.  Had a sleep study 10 years ago which was negative. Likely with moderate-severe sleep apnea. May need oxygen given her COPD.  Plan :  We discussed about the diagnosis of Obstructive Sleep Apnea (OSA) and implications of untreated OSA. We discussed about CPAP and BiPaP as possible treatment options.    We will schedule the patient for a sleep study. Plan for a lab sleep study.    Patient was instructed to call the office if he/she has not heard back from the office 1-2 weeks after the sleep study.   Patient was instructed to call the office if he/she is having issues with the PAP device.   We discussed good sleep hygiene.   Patient was advised not to engage in activities requiring concentration and/or vigilance if he/she is sleepy.  Patient was advised not to drive if he/she is sleepy.

## 2016-08-24 NOTE — Assessment & Plan Note (Signed)
Pt with chronic cough, at least 2 yrs. On and off yellow sputum.  May need daliresp.

## 2016-08-24 NOTE — Assessment & Plan Note (Addendum)
56PY smoking history, down to 1PPD, she was diagnosed with COPD in her 53s. Patient was originally from [place], moving to [place], then to [place] the last year. She would usually get a bronchitis couple times a year. She has not been on maintenance medications. Her shortness of breath has gotten worse the last year. Fortunately, she has not had bronchitis the last year. She saw her primary care doctor recently and was started on Advair, 250/50, one puff twice a day. Her breathing is better but not quite optimal. She still gets significantly short of breath with exertion. She does not have any maintenance medications.   Plan :  We extensively discussed the diagnosis, pathophysiology, and medications  for COPD.   Patient will need : PFT, ABG Chest XRay PA-L Alpha one antitrypsin level  > on f/u. Await abg.  Nocturnal oximetry > after sleep study if needed.   We will start patient on  -- Trelegy, 1 puff daily  Patient was instructed to rinse mouth each time he/she uses -- trelegy.   She was on advair, but not optimal.   Influenza vaccine -- 2017 Pneumococcal vaccine -- Got PNA 23 when she was 71 yo. Prevnar 13 08/2016.   Patient was instructed to call the office if he/she has issues with the medications.   Patient was instructed to call the office if he/she is having issues with shortness of breath (i.e. COPD exacerbation).   Patient was instructed to call the office if with increased shortness of breath, cough, sputum production, fevers. Patient may need to be seen at the office for work-up.   Counseled patient on the importance of NOT smoking.   Will evaluate need for pulm rehab.

## 2016-08-24 NOTE — Assessment & Plan Note (Signed)
Smoking cessation  

## 2016-08-24 NOTE — Progress Notes (Signed)
Subjective:    Patient ID: Theresa Mendoza, female    DOB: 22-Dec-1945, 71 y.o.   MRN: [id number]  HPI    This is the case of Theresa Mendoza, 71 y.o. Female, who was referred by Dr. Shirlean Mylar  in consultation regarding OSA.   As you very well know, patient 55PY smoking history, down to 1PPD, she was diagnosed with COPD in her 40s. Patient was originally from [place], moving to [place], then to [place] the last year. She would usually get a bronchitis couple times a year. She has not been on maintenance medications. Her shortness of breath has gotten worse the last year. Fortunately, she has not had bronchitis the last year. She saw her primary care doctor recently and was started on Advair, 250/50, one puff twice a day. Her breathing is better but not quite optimal. She still gets significantly short of breath with exertion. She does not have any maintenance medications.   She has a nebulizer machine. Her nebulizer machine is 70 years old. No medicines for that.  She has a chronic cough, she's been coughing for years now. On and off yellow sputum.  Patient has hypersomnia, snoring, witnessed apneas, gasping, choking. Her sleep schedule is all over the place. She would nap in the daytime as well as in the afternoon. She would end up sleeping for several hours in the afternoon. At night, she would end up sleeping for 4 hours. No abnormal behavior at sleep. ESS 13.   She takes Hydrocodone 3x/d for back pain.   She has cardiac issues as well as atrial fibrillation. She sees a cardiologist.  She has mental health issues with bipolar, depression, mania. This is stable now. She sees a psychiatrist.    Review of Systems  Constitutional: Negative for fever and unexpected weight change.  HENT: Negative for congestion, dental problem, ear pain, nosebleeds, postnasal drip, rhinorrhea, sinus pressure, sneezing, sore throat and trouble swallowing.   Eyes: Negative for redness and itching.    Respiratory: Positive for cough and shortness of breath. Negative for chest tightness and wheezing.   Cardiovascular: Positive for leg swelling. Negative for palpitations.  Gastrointestinal: Negative for nausea and vomiting.  Genitourinary: Negative for dysuria.  Musculoskeletal: Positive for joint swelling.  Skin: Negative for rash.  Neurological: Negative for headaches.  Hematological: Does not bruise/bleed easily.  Psychiatric/Behavioral: Negative for dysphoric mood. The patient is nervous/anxious.    Past Medical History:  Diagnosis Date  . Anxiety   . Bipolar 1 disorder (HCC)   . Bowel obstruction   . Cataract   . Chronic atrial fibrillation (HCC) 09/2014   On chronic anticoagulation with coumadin.  She has failed several DCCVs in the past.  . COPD (chronic obstructive pulmonary disease) (HCC)   . Dyslipidemia, goal LDL below 70 12/25/2015  . Glaucoma   . Hx of migraine headaches   . Memory loss   . Mitral regurgitation    mild to moderate on echo 09/2014  . Old MI (myocardial infarction) 09/2014   cath with normal coronary arteries and EF 50-55%  . Osteoarthritis   . Pulmonary HTN 09/2014   Mild with PASP at cath  . RBBB   . Rheumatoid arthritis (HCC)   . Tobacco abuse    (-) CA.  MI in 2016. CAD >> sees Cardiologist Manic/depression/bipolar >> sees Psychaitrist.  Dementia >> sees Neurologist.  Family History  Problem Relation Age of Onset  . Tuberculosis Father   . Depression Father   .  Alzheimer's disease Mother   . Lung cancer Mother      Past Surgical History:  Procedure Laterality Date  . ADENOIDECTOMY    . BACK SURGERY    . bowel obstruction    . CARDIAC CATHETERIZATION  09/2014   normal coronary arteries with mild pulmonary HTN, low normal LVF  . CHOLECYSTECTOMY    . NOSE SURGERY    . TONSILLECTOMY    . TRANSESOPHAGEAL ECHOCARDIOGRAM WITH CARDIOVERSION  09/2014  . VAGINAL HYSTERECTOMY     with BSO    Social History   Social History  .  Marital status: Widowed    Spouse name: N/A  . Number of children: N/A  . Years of education: N/A   Occupational History  . Not on file.   Social History Main Topics  . Smoking status: Current Every Day Smoker    Packs/day: 2.00    Years: 55.00    Types: Cigarettes  . Smokeless tobacco: Never Used  . Alcohol use No  . Drug use: No  . Sexual activity: No   Other Topics Concern  . Not on file   Social History Narrative   Lives with son and daughter in law in a 2 story home.  Has 1 son.  Retired from [employer].  Education: high school.  Widowed.     Allergies  Allergen Reactions  . Lithium     Patient became suicidal  . Tegretol [Carbamazepine]      Outpatient Medications Prior to Visit  Medication Sig Dispense Refill  . atenolol (TENORMIN) 25 MG tablet Take 25 mg by mouth daily.  0  . clorazepate (TRANXENE-T) 3.75 MG tablet Take 1 tablet (3.75 mg total) by mouth 2 (two) times daily as needed for anxiety. 60 tablet 5  . diazepam (VALIUM) 5 MG tablet TAKE ONE TABLET BY MOUTH 3 TIMES A DAY AS NEEDED FOR SEVERE ANXIETY  1  . digoxin (LANOXIN) 0.25 MG tablet Take 0.25 mg by mouth daily.    Marland Kitchen diltiazem (CARDIZEM CD) 240 MG 24 hr capsule Take 240 mg by mouth daily.  1  . doxepin (SINEQUAN) 25 MG capsule Take 1 capsule (25 mg total) by mouth at bedtime. 90 capsule 1  . DULoxetine (CYMBALTA) 30 MG capsule 1 qam 30 capsule 5  . DULoxetine (CYMBALTA) 30 MG capsule 1 qam 30 capsule 4  . fentaNYL (DURAGESIC - DOSED MCG/HR) 100 MCG/HR Place 100 mcg onto the skin every 3 (three) days.    . furosemide (LASIX) 40 MG tablet Take 40 mg by mouth 2 (two) times daily.   1  . HYDROcodone-acetaminophen (NORCO) 10-325 MG tablet Take 1 tablet by mouth 3 (three) times daily.    . Magnesium Oxide 400 (240 Mg) MG TABS Take 1 tablet by mouth 2 (two) times daily.  1  . Melatonin 10 MG TABS Take 1 tablet by mouth at bedtime as needed (sleep).     . OLANZapine (ZYPREXA) 5 MG tablet Take 1 tablet (5 mg total) by  mouth at bedtime. 90 tablet 0  . ondansetron (ZOFRAN) 4 MG tablet Take 4 mg by mouth every 8 (eight) hours as needed for nausea or vomiting.    . pantoprazole (PROTONIX) 40 MG tablet Take 40 mg by mouth daily.  12  . polyethylene glycol powder (GLYCOLAX/MIRALAX) powder MIX 1 CAPFUL IN WATER AND DRINK DAILY  12  . promethazine (PHENERGAN) 25 MG tablet TAKE 1/2 TABLET BY MOUTH EVERY 6 TO 8 HOURS AS NEEDED FOR NAUSEA  1  .  warfarin (COUMADIN) 5 MG tablet 1/2 tab on MWF. 1 tab on T, Th,Sat, and Sun  3  . Wheat Dextrin (BENEFIBER PO) Take by mouth as directed.     No facility-administered medications prior to visit.    Meds ordered this encounter  Medications  . Fluticasone-Umeclidin-Vilant (TRELEGY ELLIPTA) 100-62.5-25 MCG/INH AEPB    Sig: Inhale 1 puff into the lungs daily.    Dispense:  1 each    Refill:  3  . albuterol (PROVENTIL HFA;VENTOLIN HFA) 108 (90 Base) MCG/ACT inhaler    Sig: Inhale 2 puffs into the lungs every 6 (six) hours as needed for wheezing or shortness of breath.    Dispense:  1 Inhaler    Refill:  3         Objective:   Physical Exam  Vitals:  Vitals:   08/24/16 1103  BP: 120/70  Pulse: 65  SpO2: 94%  Weight: 167 lb 12.8 oz (76.1 kg)  Height: 5\' 6"  (1.676 m)    Constitutional/General:  Pleasant, well-nourished, well-developed, not in any distress,  Comfortably seating.  Well kempt  Body mass index is 27.08 kg/m. Wt Readings from Last 3 Encounters:  08/24/16 167 lb 12.8 oz (76.1 kg)  05/16/16 161 lb 12.8 oz (73.4 kg)  03/15/16 150 lb 3.2 oz (68.1 kg)    HEENT: Pupils equal and reactive to light and accommodation. Anicteric sclerae. Normal nasal mucosa.   No oral  lesions,  mouth clear,  oropharynx clear, no postnasal drip. (-) Oral thrush. No dental caries.  Airway - Mallampati class III - IV  Neck: No masses. Midline trachea. No JVD, (-) LAD. (-) bruits appreciated.  Respiratory/Chest: Grossly normal chest. (-) deformity. (-) Accessory muscle  use.  Symmetric expansion. (-) Tenderness on palpation.  Resonant on percussion.  Diminished BS on both lower lung zones. (-) crackles, rhonchi. Occasional wheezing in BULF (-) egophony  Cardiovascular: Regular rate and  rhythm, heart sounds normal, no murmur or gallops, no peripheral edema  Gastrointestinal:  Normal bowel sounds. Soft, non-tender. No hepatosplenomegaly.  (-) masses.   Musculoskeletal:  Normal muscle tone. Normal gait.   Extremities: Grossly normal. (-) clubbing, cyanosis.  (-) edema  Skin: (-) rash,lesions seen.   Neurological/Psychiatric : alert, oriented to time, place, person. Normal mood and affect          Assessment & Plan:  COPD (chronic obstructive pulmonary disease) (HCC) 55PY smoking history, down to 1PPD, she was diagnosed with COPD in her 40s. Patient was [place] from [place], moving to [place], then to [place] the last year. She would usually get a bronchitis couple times a year. She has not been on maintenance medications. Her shortness of breath has gotten worse the last year. Fortunately, she has not had bronchitis the last year. She saw her primary care doctor recently and was started on Advair, 250/50, one puff twice a day. Her breathing is better but not quite optimal. She still gets significantly short of breath with exertion. She does not have any maintenance medications.   Plan :  We extensively discussed the diagnosis, pathophysiology, and medications  for COPD.   Patient will need : PFT, ABG Chest XRay PA-L Alpha one antitrypsin level  [place] > on f/u. Await abg.  Nocturnal oximetry > after sleep study if needed.   We will start patient on  -- Trelegy, 1 puff daily  Patient was instructed to rinse mouth each time he/she uses -- trelegy.   She was on advair, but not optimal.  Influenza vaccine -- 2017 Pneumococcal vaccine -- Got PNA 23 when she was 71 yo. Prevnar 13 08/2016.   Patient was instructed to call the office  if he/she has issues with the medications.   Patient was instructed to call the office if he/she is having issues with shortness of breath (i.e. COPD exacerbation).   Patient was instructed to call the office if with increased shortness of breath, cough, sputum production, fevers. Patient may need to be seen at the office for work-up.   Counseled patient on the importance of NOT smoking.   Will evaluate need for pulm rehab.    Tobacco use disorder Smoking cessation  Chronic bronchitis (HCC) Pt with chronic cough, at least 2 yrs. On and off yellow sputum.  May need daliresp.   Hypersomnia Patient with hypersomnia, snoring, fatigue, gasping. She almost daily. Very tired and fatigued. ESS 13.  Had a sleep study 10 years ago which was negative. Likely with moderate-severe sleep apnea. May need oxygen given her COPD.  Plan :  We discussed about the diagnosis of Obstructive Sleep Apnea (OSA) and implications of untreated OSA. We discussed about CPAP and BiPaP as possible treatment options.    We will schedule the patient for a sleep study. Plan for a lab sleep study.    Patient was instructed to call the office if he/she has not heard back from the office 1-2 weeks after the sleep study.   Patient was instructed to call the office if he/she is having issues with the PAP device.   We discussed good sleep hygiene.   Patient was advised not to engage in activities requiring concentration and/or vigilance if he/she is sleepy.  Patient was advised not to drive if he/she is sleepy.       Thank you very much for letting me participate in this patient's care. Please do not hesitate to give me a call if you have any questions or concerns regarding the treatment plan.   Patient will follow up with me in 6 weeks.     Pollie Meyer, MD 08/24/2016   3:10 PM Pulmonary and Critical Care Medicine [facility] Pager: [phone] Office: [phone], Fax: [phone]

## 2016-08-30 NOTE — Progress Notes (Signed)
Left message to call back for medical results.

## 2016-08-31 DIAGNOSIS — Z7901 Long term (current) use of anticoagulants: Secondary | ICD-10-CM | POA: Diagnosis not present

## 2016-09-04 NOTE — Progress Notes (Signed)
Spoke with patient and informed her of results. Pt did not have any questions. Nothing further is needed.

## 2016-09-07 ENCOUNTER — Ambulatory Visit (HOSPITAL_COMMUNITY): Payer: Self-pay | Admitting: Clinical

## 2016-09-14 ENCOUNTER — Ambulatory Visit (HOSPITAL_COMMUNITY): Payer: Self-pay | Admitting: Clinical

## 2016-09-18 ENCOUNTER — Encounter (HOSPITAL_COMMUNITY): Payer: Self-pay

## 2016-09-19 ENCOUNTER — Telehealth: Payer: Self-pay | Admitting: Pulmonary Disease

## 2016-09-19 NOTE — Telephone Encounter (Signed)
Contacted [employer] at [phone]. Stayed on line with them for several minutes and I was transferred to several different people and no one could help me do the PA. PA has been started on Cover My Meds. Key: [id number]. Will await PA decision.

## 2016-09-20 ENCOUNTER — Inpatient Hospital Stay (HOSPITAL_BASED_OUTPATIENT_CLINIC_OR_DEPARTMENT_OTHER): Admission: RE | Admit: 2016-09-20 | Payer: Medicare Other | Source: Ambulatory Visit

## 2016-09-21 ENCOUNTER — Ambulatory Visit (HOSPITAL_COMMUNITY)
Admission: RE | Admit: 2016-09-21 | Discharge: 2016-09-21 | Disposition: A | Discharge status: Home / Self Care | Payer: Medicare Other | Source: Ambulatory Visit | Attending: Pulmonary Disease | Admitting: Pulmonary Disease

## 2016-09-21 DIAGNOSIS — J441 Chronic obstructive pulmonary disease with (acute) exacerbation: Secondary | ICD-10-CM | POA: Diagnosis not present

## 2016-09-21 LAB — PULMONARY FUNCTION TEST
DL/VA % pred: 66 %
DL/VA: 3.37 ml/min/mmHg/L
DLCO UNC: 15.42 ml/min/mmHg
DLCO cor % pred: 53 %
DLCO cor: 14.49 ml/min/mmHg
DLCO unc % pred: 57 %
FEF 25-75 Post: 2.22 L/sec
FEF 25-75 Pre: 1.73 L/sec
FEF2575-%Change-Post: 27 %
FEF2575-%PRED-POST: 108 %
FEF2575-%Pred-Pre: 84 %
FEV1-%CHANGE-POST: 8 %
FEV1-%PRED-POST: 76 %
FEV1-%Pred-Pre: 70 %
FEV1-PRE: 1.75 L
FEV1-Post: 1.9 L
FEV1FVC-%Change-Post: 2 %
FEV1FVC-%Pred-Pre: 104 %
FEV6-%Change-Post: 5 %
FEV6-%Pred-Post: 74 %
FEV6-%Pred-Pre: 70 %
FEV6-POST: 2.32 L
FEV6-PRE: 2.2 L
FEV6FVC-%PRED-POST: 104 %
FEV6FVC-%PRED-PRE: 104 %
FVC-%CHANGE-POST: 5 %
FVC-%PRED-PRE: 67 %
FVC-%Pred-Post: 71 %
FVC-POST: 2.32 L
FVC-PRE: 2.2 L
POST FEV6/FVC RATIO: 100 %
PRE FEV6/FVC RATIO: 100 %
Post FEV1/FVC ratio: 82 %
Pre FEV1/FVC ratio: 79 %
RV % PRED: 113 %
RV: 2.59 L
TLC % PRED: 91 %
TLC: 4.9 L

## 2016-09-21 LAB — BLOOD GAS, ARTERIAL
ACID-BASE EXCESS: 2.6 mmol/L — AB (ref 0.0–2.0)
Bicarbonate: 25.7 mmol/L (ref 20.0–28.0)
Drawn by: 27052
FIO2: 21
O2 Saturation: 97.4 %
PH ART: 7.502 — AB (ref 7.350–7.450)
Patient temperature: 98.6
pCO2 arterial: 33 mmHg (ref 32.0–48.0)
pO2, Arterial: 86.3 mmHg (ref 83.0–108.0)

## 2016-09-21 MED ORDER — ALBUTEROL SULFATE (2.5 MG/3ML) 0.083% IN NEBU
2.5000 mg | INHALATION_SOLUTION | Freq: Once | RESPIRATORY_TRACT | Status: AC
  Administered 2016-09-21: 2.5 mg via RESPIRATORY_TRACT

## 2016-09-22 NOTE — Telephone Encounter (Signed)
Check status of PA on CMM. PA was approved from dates 08/23/16-09/22/2017. Key: [id number].  Contacted the pharmacy and made them aware.  Attempted to contact [facility] but no vm was set up yet.

## 2016-09-26 NOTE — Telephone Encounter (Signed)
ATC [name], pt's daughter. VM box is full. WCB.

## 2016-09-28 NOTE — Telephone Encounter (Signed)
ATC pt and pt's daughter, unable to leave VM at this time. Will try on 3.16.18 for final attempt to reach pt before signing off.

## 2016-09-29 NOTE — Telephone Encounter (Signed)
Attempted to contact the pts daughter again.  It stated that the mailbox is full and not able to leave a message.

## 2016-10-01 ENCOUNTER — Ambulatory Visit (HOSPITAL_BASED_OUTPATIENT_CLINIC_OR_DEPARTMENT_OTHER): Payer: Medicare Other | Attending: Pulmonary Disease | Admitting: Pulmonary Disease

## 2016-10-01 VITALS — Ht 66.0 in | Wt 170.0 lb

## 2016-10-01 DIAGNOSIS — G4736 Sleep related hypoventilation in conditions classified elsewhere: Secondary | ICD-10-CM | POA: Diagnosis not present

## 2016-10-01 DIAGNOSIS — G4710 Hypersomnia, unspecified: Secondary | ICD-10-CM | POA: Diagnosis not present

## 2016-10-01 DIAGNOSIS — I493 Ventricular premature depolarization: Secondary | ICD-10-CM | POA: Insufficient documentation

## 2016-10-01 DIAGNOSIS — I4891 Unspecified atrial fibrillation: Secondary | ICD-10-CM | POA: Insufficient documentation

## 2016-10-01 DIAGNOSIS — R0683 Snoring: Secondary | ICD-10-CM | POA: Diagnosis not present

## 2016-10-01 DIAGNOSIS — G4730 Sleep apnea, unspecified: Secondary | ICD-10-CM

## 2016-10-02 ENCOUNTER — Encounter (HOSPITAL_COMMUNITY): Payer: Self-pay | Admitting: Clinical

## 2016-10-02 ENCOUNTER — Ambulatory Visit (INDEPENDENT_AMBULATORY_CARE_PROVIDER_SITE_OTHER): Payer: Medicare Other | Admitting: Clinical

## 2016-10-02 DIAGNOSIS — F3130 Bipolar disorder, current episode depressed, mild or moderate severity, unspecified: Secondary | ICD-10-CM

## 2016-10-02 NOTE — Progress Notes (Signed)
   THERAPIST PROGRESS NOTE  Session Time: 4:30 -5:13  Participation Level: Active  Behavioral Response: CasualAlertDepressed  Type of Therapy: Individual Therapy  Treatment Goals addressed: improve psychiatric symptoms, elevate mood (decreased isolation, increased social interaction), improve unhelpful thought patterns, implement interpersonal relationship ( more honest communication),  healthy coping skills  Interventions: CBT, Motivational Interviewing, psychoeducation, grounding  techniques  Summary: Theresa Mendoza is a 71 y.o. female who presents with Bipolar I disorder, most recent episode depressed.   Suicidal/Homicidal: No   without intent/plan  Therapist Response: Theresa Mendoza met with clinician for an individual therapy session. [name] discussed her psychiatric symptoms and her current life events. [name] shared that she had gone to visit her brother and had a really wonderful time. She shared that upon returning home (she lives with son and daughter-in-law) things once again became stressful. She shared that she works to stay out of the way and avoid conflict. Client and clinician discussed prior her going [facility] center or [facility] so that she could have outside interactions. She shared she would love to. [name] shared that she is dependent on her daughter in law for transportation. Client and clinician discussed additional options such as [facility]. [name] shared that she was feeling guilty because some of the interactions at home made her experience conflicting emotions. Client and clinician discussed how it is possible for both emotions to be true. Client and clinician discussed honest communication and timing.  Client and clinician discussed healthy coping skills. Clinician taught Theresa Mendoza a new grounding technique. Client and clinician practiced the grounding technique together.   Plan: Return again in 1-2 weeks.  Diagnosis: Axis I: Bipolar I disorder, most recent episode  depressed     Mj Willis A, LCSW 10/02/2016

## 2016-10-03 NOTE — Procedures (Signed)
    NAME: Theresa Mendoza DATE OF BIRTH:  December 25, 1945 MEDICAL RECORD NUMBER [id number]  LOCATION: [facility]  PHYSICIAN: Daneen Schick Dios  DATE OF STUDY: 10/01/2016    CLINICAL INFORMATION  Sleep Study Type: NPSG  Indication for sleep study: COPD, OSA   Epworth Sleepiness Score: 6   SLEEP STUDY TECHNIQUE  As per the AASM Manual for the Scoring of Sleep and Associated Events v2.3 (April 2016) with a hypopnea requiring 4% desaturations.  The channels recorded and monitored were frontal, central and occipital EEG, electrooculogram (EOG), submentalis EMG (chin), nasal and oral airflow, thoracic and abdominal wall motion, anterior tibialis EMG, snore microphone, electrocardiogram, and pulse oximetry.   MEDICATIONS  Medications self-administered by patient taken the night of the study : . HYDROCODONE W/ACETAMINOPHEN. Meds reviewed per chart review done.  SLEEP ARCHITECTURE  The study was initiated at 10:13:16 PM and ended at 4:53:32 AM.  Sleep onset time was 75.7 minutes and the sleep efficiency was 73.9%. The total sleep time was 296.0 minutes.  Stage REM latency was 269.5 minutes.  The patient spent 8.61% of the night in stage N1 sleep, 72.97% in stage N2 sleep, 0.68% in stage N3 and 17.74% in REM.  Alpha intrusion was absent.  Supine sleep was 49.50%.   RESPIRATORY PARAMETERS  The overall apnea/hypopnea index (AHI) was 3.2 per hour. There were 16 total apneas, including 2 obstructive, 12 central and 2 mixed apneas. There were 0 hypopneas and 1 RERAs.  The AHI during Stage REM sleep was 17.1 per hour. AHI while supine was 0.4 per hour.  The mean oxygen saturation was 88.80%. The minimum SpO2 during sleep was 81.00%.  Moderate snoring was noted during this study.  CARDIAC DATA  The 2 lead EKG demonstrated sinus rhythm. The mean heart rate was 63.63 beats per minute. Other EKG findings include: Atrial Fibrillation, PVCs.   LEG MOVEMENT DATA  The total  PLMS were 0 with a resulting PLMS index of 0.00. Associated arousal with leg movement index was 0.0 .  IMPRESSIONS  1. No significant obstructive sleep apnea occurred during this study (AHI = 3.2/h). 2. No significant central sleep apnea occurred during this study (CAI = 2.4/h). 3. Significant oxygen desaturation was noted during this study (Min O2 = 81.00%). 4. The patient snored with Moderate snoring volume. 5. EKG findings include Atrial Fibrillation, PVCs.  DIAGNOSIS  1. Nocturnal Hypoxemia (327.26 [G47.36 ICD-10]). Patient had 105.2 minutes off O2 saturation less than or equal to 88% while asleep on room air during this study. 2. No evidence for significant OSA based on this study.  RECOMMENDATIONS  1. No evidence for significant OSA based on this study. 2. Start O2 2L at bedtime for non apneic hypoxemia. Patient will need an overnight oximetry on 2 L oxygen at bedtime. 3. Avoid alcohol, sedatives and other CNS depressants that may worsen sleep apnea and disrupt normal sleep architecture. 4. Sleep hygiene should be reviewed to assess factors that may improve sleep quality. 5. Weight management and regular exercise should be initiated or continued if appropriate. 6. Follow up in the office as scheduled.   Pollie Meyer, MD 10/03/2016, 1:45 PM [facility] Pager [phone] After 3 pm or if no answer, call [phone]

## 2016-10-04 ENCOUNTER — Telehealth: Payer: Self-pay | Admitting: Pulmonary Disease

## 2016-10-04 DIAGNOSIS — G4710 Hypersomnia, unspecified: Secondary | ICD-10-CM | POA: Diagnosis not present

## 2016-10-04 NOTE — Telephone Encounter (Signed)
   Pls tell pt that the sleep study was (-) for significant OSA. It did show that her o2 level dropped. She will need O2, 2L at HS.   Jasmine/Libby >>> patient had a lab study which was negative for sleep apnea but based on the study, she needed oxygen at bedtime. She needs at least 2 L oxygen at bedtime. Does she need an ONO to qualify for O2?  Or will the sleep study (lab) be more than sufficient?  Thanks.   Pollie Meyer, MD 10/04/2016, 10:07 AM [facility] [facility] Pager [phone] After 3 pm or if no answer, call [phone]

## 2016-10-04 NOTE — Telephone Encounter (Signed)
Order needs to be placed for the 02 and we will send it to [facility]

## 2016-10-04 NOTE — Telephone Encounter (Signed)
[email or web address] x1 

## 2016-10-05 DIAGNOSIS — Z7901 Long term (current) use of anticoagulants: Secondary | ICD-10-CM | POA: Diagnosis not present

## 2016-10-05 NOTE — Telephone Encounter (Signed)
Attempted to contact pt but mailbox was full.

## 2016-10-09 DIAGNOSIS — J441 Chronic obstructive pulmonary disease with (acute) exacerbation: Secondary | ICD-10-CM | POA: Diagnosis not present

## 2016-10-09 DIAGNOSIS — G4734 Idiopathic sleep related nonobstructive alveolar hypoventilation: Secondary | ICD-10-CM | POA: Diagnosis not present

## 2016-10-10 DIAGNOSIS — J441 Chronic obstructive pulmonary disease with (acute) exacerbation: Secondary | ICD-10-CM | POA: Diagnosis not present

## 2016-10-10 DIAGNOSIS — G4734 Idiopathic sleep related nonobstructive alveolar hypoventilation: Secondary | ICD-10-CM | POA: Diagnosis not present

## 2016-10-11 NOTE — Telephone Encounter (Signed)
Attempted to contact pt, automatic voice stated pt was not accepting calls right now

## 2016-10-17 DIAGNOSIS — G4734 Idiopathic sleep related nonobstructive alveolar hypoventilation: Secondary | ICD-10-CM | POA: Diagnosis not present

## 2016-10-17 DIAGNOSIS — L03032 Cellulitis of left toe: Secondary | ICD-10-CM | POA: Diagnosis not present

## 2016-10-17 NOTE — Telephone Encounter (Signed)
3rd attempt was made to daugther and pt but unable to reach them. Mailbox was full, will send a letter

## 2016-10-19 ENCOUNTER — Encounter (HOSPITAL_COMMUNITY): Payer: Self-pay | Admitting: Clinical

## 2016-10-19 ENCOUNTER — Ambulatory Visit (INDEPENDENT_AMBULATORY_CARE_PROVIDER_SITE_OTHER): Payer: Medicare Other | Admitting: Clinical

## 2016-10-19 DIAGNOSIS — F3130 Bipolar disorder, current episode depressed, mild or moderate severity, unspecified: Secondary | ICD-10-CM | POA: Diagnosis not present

## 2016-10-19 NOTE — Progress Notes (Signed)
   THERAPIST PROGRESS NOTE  Session Time: 3:30 -4:05  Participation Level: Active  Behavioral Response: CasualAlertAnxious and Depressed  Type of Therapy: Individual Therapy  Treatment Goals addressed: improve psychiatric symptoms, , healthy coping skills  Interventions:  Motivational Interviewing,   Summary: Ryley Teater is a 71 y.o. female who presents with Bipolar I disorder, most recent episode depressed.   Suicidal/Homicidal: No   without intent/plan  Therapist Response: Vaughan Basta met with clinician for an individual therapy session. Glenette discussed her psychiatric symptoms and her current life events. Srija shared that she has had some good days and some difficult days. She shared that her memory is getting worse.  Clinician asked open ended questions. Pam shared that her granddaughter, her boyfriend, and great granddaughter are now living in the house. This improves her mood because she enjoys interacting with them. She shared that however she continues to feel isolated in the house for the most part. Clinician asked open ended questions and Erline identified one thing she could do to improve her situation (walking in the neighborhood).   Plan: Return again in 1-2 weeks.  Diagnosis: Axis I: Bipolar I disorder, most recent episode depressed      Nechemia Chiappetta A, LCSW 10/19/2016

## 2016-11-02 ENCOUNTER — Ambulatory Visit (INDEPENDENT_AMBULATORY_CARE_PROVIDER_SITE_OTHER): Payer: Medicare Other | Admitting: Pulmonary Disease

## 2016-11-02 ENCOUNTER — Other Ambulatory Visit: Payer: Medicare Other

## 2016-11-02 ENCOUNTER — Ambulatory Visit (HOSPITAL_COMMUNITY): Payer: Medicare Other | Admitting: Clinical

## 2016-11-02 ENCOUNTER — Encounter: Payer: Self-pay | Admitting: Pulmonary Disease

## 2016-11-02 DIAGNOSIS — G4710 Hypersomnia, unspecified: Secondary | ICD-10-CM | POA: Diagnosis not present

## 2016-11-02 DIAGNOSIS — F17200 Nicotine dependence, unspecified, uncomplicated: Secondary | ICD-10-CM

## 2016-11-02 DIAGNOSIS — J439 Emphysema, unspecified: Secondary | ICD-10-CM

## 2016-11-02 NOTE — Assessment & Plan Note (Addendum)
55PY smoking history, down to 1PPD, she was diagnosed with COPD in her 52s. Patient was originally from [place], moving to [place], then to [place] the last year. She would usually get a bronchitis couple times a year. She has not been on maintenance medications.   PFT (08/2016)  Ratio normal.  FEV1 1.75  70%. FLOOP was suggestive of copd.  ABG OK Sleep study 08/2016 was (-) but neede O2.  CXR 08/2016 >> COPD changes.   She was tried on Advair last year >> not significantly improved.  We tried Trelegy in 08/2016 >> not significantly better.    Plan :  We extensively discussed the diagnosis, pathophysiology, and medications  for COPD.   She has not felt significantly improved with Advair or Trelegy. She is functional with her ADLs. She wanted to hold off on maintenance meds for now. Cont prn albuterol. She was told to give [place] a call if she ends up using albuterol more frequently. At that time, we need to determine a substitute to trelegy as trelegy was not approved by insurance.   Influenza vaccine -- 2017 Pneumococcal vaccine -- Got PNA 23 when she was 71 yo. Prevnar 13 08/2016.   Patient was instructed to call the office if he/she has issues with the medications.   Patient was instructed to call the office if he/she is having issues with shortness of breath (i.e. COPD exacerbation).   Patient was instructed to call the office if with increased shortness of breath, cough, sputum production, fevers. Patient may need to be seen at the office for work-up.   Counseled patient on the importance of NOT smoking.   Will evaluate need for pulm rehab.

## 2016-11-02 NOTE — Assessment & Plan Note (Addendum)
Patient with hypersomnia, snoring, fatigue, gasping. She almost daily. Very tired and fatigued. ESS 13.  Had a sleep study 10 years ago which was negative.  Sleep study done in 10/2016 was (-) but she needed O2.   She was started on oxygen by her primary care doctor. She is currently on 3 L oxygen at bedtime.

## 2016-11-02 NOTE — Patient Instructions (Signed)
It was a pleasure taking care of you today!  You are diagnosed with Chronic Obstructive Pulmonary Disease or COPD.  COPD is a preventable and treatable disease that makes it difficult to empty air out of the lungs (airflow obstruction).  This can lead to shortness of breath.   Sometimes, when you have a lung infection, this can make your breathing worse, and will cause you to have a COPD flare-up or an acute exacerbation of COPD. Please call your primary care doctor or the office if you are having a COPD flare-up.   Smoking makes COPD worse.    Rescue medications: Albuterol 2 puffs every 4 hours as needed for shortness of breath.   Please rinse your mouth each time you use your maintenance medication.  Please call the office if you are having issues with your medications   Return to clinic in 6 months with NP  We will do blood work today for COPD.

## 2016-11-02 NOTE — Progress Notes (Signed)
Subjective:    Patient ID: Theresa Mendoza, female    DOB: 23-Jul-1945, 71 y.o.   MRN: [id number]  HPI    This is the case of [name], 71 y.o. Female, who was referred by Dr. Shirlean Mylar  in consultation regarding OSA.   As you very well know, patient 55PY smoking history, down to 1PPD, she was diagnosed with COPD in her 40s. Patient was originally from [place], moving to [place], then to [place] the last year. She would usually get a bronchitis couple times a year. She has not been on maintenance medications. Her shortness of breath has gotten worse the last year. Fortunately, she has not had bronchitis the last year. She saw her primary care doctor recently and was started on Advair, 250/50, one puff twice a day. Her breathing is better but not quite optimal. She still gets significantly short of breath with exertion. She does not have any maintenance medications.   She has a nebulizer machine. Her nebulizer machine is 70 years old. No medicines for that.  She has a chronic cough, she's been coughing for years now. On and off yellow sputum.  Patient has hypersomnia, snoring, witnessed apneas, gasping, choking. Her sleep schedule is all over the place. She would nap in the daytime as well as in the afternoon. She would end up sleeping for several hours in the afternoon. At night, she would end up sleeping for 4 hours. No abnormal behavior at sleep. ESS 13.   She takes Hydrocodone 3x/d for back pain.   She has cardiac issues as well as atrial fibrillation. She sees a cardiologist.  She has mental health issues with bipolar, depression, mania. This is stable now. She sees a psychiatrist.  [name] 11/02/16  Patient returns to the office as follow-up on her COPD. Since last seen, her dyspnea is stable. She used Trelegy for 1-2 weeks with  no significant difference. She is able to do her ADLs without much difficulty. Her sleep study was negative but based on that, she needed oxygen at  bedtime. She has been on oxygen 3 L at bedtime ordered by her primary care doctor. No recent infection or admission to the hospital. She continues to smoke cigarettes.  Review of Systems  Constitutional: Negative for fever and unexpected weight change.  HENT: Negative for congestion, dental problem, ear pain, nosebleeds, postnasal drip, rhinorrhea, sinus pressure, sneezing, sore throat and trouble swallowing.   Eyes: Negative for redness and itching.  Respiratory: Positive for cough and shortness of breath. Negative for chest tightness and wheezing.   Cardiovascular: Positive for leg swelling. Negative for palpitations.  Gastrointestinal: Negative for nausea and vomiting.  Genitourinary: Negative for dysuria.  Musculoskeletal: Positive for joint swelling.  Skin: Negative for rash.  Neurological: Negative for headaches.  Hematological: Does not bruise/bleed easily.  Psychiatric/Behavioral: Negative for dysphoric mood. The patient is nervous/anxious.          Objective:   Physical Exam  Vitals:  Vitals:   11/02/16 0958  BP: (!) 112/56  Pulse: (!) 51  SpO2: 93%  Weight: 165 lb (74.8 kg)  Height: 5\' 6"  (1.676 m)    Constitutional/General:  Pleasant, well-nourished, well-developed, not in any distress,  Comfortably seating.  Well kempt  Body mass index is 26.63 kg/m. Wt Readings from Last 3 Encounters:  11/02/16 165 lb (74.8 kg)  10/01/16 170 lb (77.1 kg)  08/24/16 167 lb 12.8 oz (76.1 kg)    HEENT: Pupils equal and reactive to light and accommodation.  Anicteric sclerae. Normal nasal mucosa.   No oral  lesions,  mouth clear,  oropharynx clear, no postnasal drip. (-) Oral thrush. No dental caries.  Airway - Mallampati class III - IV  Neck: No masses. Midline trachea. No JVD, (-) LAD. (-) bruits appreciated.  Respiratory/Chest: Grossly normal chest. (-) deformity. (-) Accessory muscle use.  Symmetric expansion. (-) Tenderness on palpation.  Resonant on percussion.    Diminished BS on both lower lung zones. (-) crackles, rhonchi. Occasional wheezing in BULF (-) egophony  Cardiovascular: Regular rate and  rhythm, heart sounds normal, no murmur or gallops, no peripheral edema  Gastrointestinal:  Normal bowel sounds. Soft, non-tender. No hepatosplenomegaly.  (-) masses.   Musculoskeletal:  Normal muscle tone. Normal gait.   Extremities: Grossly normal. (-) clubbing, cyanosis.  (-) edema  Skin: (-) rash,lesions seen.   Neurological/Psychiatric : alert, oriented to time, place, person. Normal mood and affect          Assessment & Plan:  COPD (chronic obstructive pulmonary disease) (HCC) 55PY smoking history, down to 1PPD, she was diagnosed with COPD in her 40s. Patient was originally from [place], moving to [place], then to [place] the last year. She would usually get a bronchitis couple times a year. She has not been on maintenance medications.   PFT (08/2016)  Ratio normal.  FEV1 1.75  70%. FLOOP was suggestive of copd.  ABG OK Sleep study 08/2016 was (-) but neede O2.  CXR 08/2016 >> COPD changes.   She was tried on Advair last year >> not significantly improved.  We tried Trelegy in 08/2016 >> not significantly better.    Plan :  We extensively discussed the diagnosis, pathophysiology, and medications  for COPD.   She has not felt significantly improved with Advair or Trelegy. She is functional with her ADLs. She wanted to hold off on maintenance meds for now. Cont prn albuterol. She was told to give [place] a call if she ends up using albuterol more frequently. At that time, we need to determine a substitute to trelegy as trelegy was not approved by insurance.   Influenza vaccine -- 2017 Pneumococcal vaccine -- Got PNA 23 when she was 71 yo. Prevnar 13 08/2016.   Patient was instructed to call the office if he/she has issues with the medications.   Patient was instructed to call the office if he/she is having issues with shortness of  breath (i.e. COPD exacerbation).   Patient was instructed to call the office if with increased shortness of breath, cough, sputum production, fevers. Patient may need to be seen at the office for work-up.   Counseled patient on the importance of NOT smoking.   Will evaluate need for pulm rehab.    Tobacco use disorder Smoking cessation  Hypersomnia Patient with hypersomnia, snoring, fatigue, gasping. She almost daily. Very tired and fatigued. ESS 13.  Had a sleep study 10 years ago which was negative.  Sleep study done in 10/2016 was (-) but she needed O2.   She was started on oxygen by her primary care doctor. She is currently on 3 L oxygen at bedtime.     Patient will follow up in 6 mos.     Pollie Meyer, MD 11/02/2016   10:51 AM Pulmonary and Critical Care Medicine [facility] Pager: [phone] Office: [phone], Fax: [phone]

## 2016-11-02 NOTE — Assessment & Plan Note (Signed)
Smoking cessation  

## 2016-11-03 DIAGNOSIS — Z7901 Long term (current) use of anticoagulants: Secondary | ICD-10-CM | POA: Diagnosis not present

## 2016-11-03 DIAGNOSIS — G894 Chronic pain syndrome: Secondary | ICD-10-CM | POA: Diagnosis not present

## 2016-11-03 DIAGNOSIS — Z5181 Encounter for therapeutic drug level monitoring: Secondary | ICD-10-CM | POA: Diagnosis not present

## 2016-11-03 DIAGNOSIS — I4891 Unspecified atrial fibrillation: Secondary | ICD-10-CM | POA: Diagnosis not present

## 2016-11-03 DIAGNOSIS — I2510 Atherosclerotic heart disease of native coronary artery without angina pectoris: Secondary | ICD-10-CM | POA: Diagnosis not present

## 2016-11-07 LAB — ALPHA-1 ANTITRYPSIN PHENOTYPE: A-1 Antitrypsin: 164 mg/dL (ref 83–199)

## 2016-11-08 ENCOUNTER — Encounter (HOSPITAL_COMMUNITY): Payer: Self-pay | Admitting: Psychiatry

## 2016-11-08 ENCOUNTER — Ambulatory Visit (INDEPENDENT_AMBULATORY_CARE_PROVIDER_SITE_OTHER): Payer: Medicare Other | Admitting: Psychiatry

## 2016-11-08 VITALS — BP 128/72 | HR 84 | Ht 66.0 in | Wt 166.4 lb

## 2016-11-08 DIAGNOSIS — Z810 Family history of intellectual disabilities: Secondary | ICD-10-CM | POA: Diagnosis not present

## 2016-11-08 DIAGNOSIS — Z818 Family history of other mental and behavioral disorders: Secondary | ICD-10-CM

## 2016-11-08 DIAGNOSIS — Z79899 Other long term (current) drug therapy: Secondary | ICD-10-CM | POA: Diagnosis not present

## 2016-11-08 DIAGNOSIS — F17210 Nicotine dependence, cigarettes, uncomplicated: Secondary | ICD-10-CM | POA: Diagnosis not present

## 2016-11-08 DIAGNOSIS — F3130 Bipolar disorder, current episode depressed, mild or moderate severity, unspecified: Secondary | ICD-10-CM | POA: Diagnosis not present

## 2016-11-08 DIAGNOSIS — F3342 Major depressive disorder, recurrent, in full remission: Secondary | ICD-10-CM

## 2016-11-08 MED ORDER — DOXEPIN HCL 50 MG PO CAPS: 25.0000 mg | ORAL_CAPSULE | Freq: Every day | ORAL | 5 refills | Status: DC

## 2016-11-08 MED ORDER — CLORAZEPATE DIPOTASSIUM 3.75 MG PO TABS: 3.7500 mg | ORAL_TABLET | Freq: Two times a day (BID) | ORAL | 5 refills | Status: DC | PRN

## 2016-11-08 MED ORDER — DULOXETINE HCL 20 MG PO CPEP: 20.0000 mg | ORAL_CAPSULE | Freq: Every day | ORAL | 5 refills | Status: DC

## 2016-11-08 NOTE — Progress Notes (Signed)
Psychiatric Initial Adult Assessment   Patient Identification: Theresa Mendoza MRN:  [id number] Date of Evaluation:  11/08/2016 Referral Source: Dr. Allena Katz Chief Complaint:   Visit Diagnosis:    ICD-9-CM ICD-10-CM   1. Major depression, recurrent, full remission (HCC) 296.36 F33.42 doxepin (SINEQUAN) 50 MG capsule    History of Present Illness:   Today the patient is doing well. She was seen first by herself and then her son came in with her. She says her mood is good. She does not feel euphoric or depressed. She does not feel anxious. Apparently there has been a housing change. At this time she lives with 6 family members. The patient says she sleeps poorly. She thinks she sleeps poorly because she grinds her teeth and her jaws feel sore. She is eating fairly well although it's noted she's lost a little weight. Her energy level is very good. She is active. She denies being anxious. She takes her medicines just as prescribed. The only exception to this is the fact that for some reason she is no longer taking doxepin and is not sleeping. Patient denies the use of alcohol or drugs. She is no hallucinations. She looks a bit disheveled in this interview. She is engaging and friendly she speaks normally. Her thought process appears within normal limits. Her thoughts are clear and organized. She is in no distress. (Hypo) Manic Symptoms:  Irritable Mood, Anxiety Symptoms:   Psychotic Symptoms:   PTSD Symptoms:   Past Psychiatric History: 3 psychiatric hospitalizations 30 years ago. The patient also been on a few antidepressants by her primary care doctor years but she's not specific about which ones they were. She suggested that they were probably Zoloft and Prozac.  Previous Psychotropic Medications: Yes   Substance Abuse History in the last 12 months:  No.  Consequences of Substance Abuse: Negative  Past Medical History:  Past Medical History:  Diagnosis Date  . Anxiety   . Bipolar 1  disorder (HCC)   . Bowel obstruction (HCC)   . Cataract   . Chronic atrial fibrillation (HCC) 09/2014   On chronic anticoagulation with coumadin.  She has failed several DCCVs in the past.  . COPD (chronic obstructive pulmonary disease) (HCC)   . Dyslipidemia, goal LDL below 70 12/25/2015  . Glaucoma   . Hx of migraine headaches   . Memory loss   . Mitral regurgitation    mild to moderate on echo 09/2014  . Old MI (myocardial infarction) 09/2014   cath with normal coronary arteries and EF 50-55%  . Osteoarthritis   . Pulmonary HTN (HCC) 09/2014   Mild with PASP at cath  . RBBB   . Rheumatoid arthritis (HCC)   . Tobacco abuse     Past Surgical History:  Procedure Laterality Date  . ADENOIDECTOMY    . BACK SURGERY    . bowel obstruction    . CARDIAC CATHETERIZATION  09/2014   normal coronary arteries with mild pulmonary HTN, low normal LVF  . CHOLECYSTECTOMY    . NOSE SURGERY    . TONSILLECTOMY    . TRANSESOPHAGEAL ECHOCARDIOGRAM WITH CARDIOVERSION  09/2014  . VAGINAL HYSTERECTOMY     with BSO    Family Psychiatric History:   Family History:  Family History  Problem Relation Age of Onset  . Tuberculosis Father   . Depression Father   . Alzheimer's disease Mother   . Lung cancer Mother     Social History:   Social History   Social  History  . Marital status: Widowed    Spouse name: N/A  . Number of children: N/A  . Years of education: N/A   Social History Main Topics  . Smoking status: Current Every Day Smoker    Packs/day: 2.00    Years: 55.00    Types: Cigarettes  . Smokeless tobacco: Never Used  . Alcohol use No  . Drug use: No  . Sexual activity: No   Other Topics Concern  . None   Social History Narrative   Lives with son and daughter in law in a 2 story home.  Has 1 son.  Retired from [employer].  Education: high school.  Widowed.    Additional Social History:   Allergies:   Allergies  Allergen Reactions  . Lithium     Patient became suicidal   . Tegretol [Carbamazepine]     Metabolic Disorder Labs: No results found for: HGBA1C, MPG No results found for: PROLACTIN No results found for: CHOL, TRIG, HDL, CHOLHDL, VLDL, LDLCALC   Current Medications: Current Outpatient Prescriptions  Medication Sig Dispense Refill  . albuterol (PROVENTIL HFA;VENTOLIN HFA) 108 (90 Base) MCG/ACT inhaler Inhale 2 puffs into the lungs every 6 (six) hours as needed for wheezing or shortness of breath. 1 Inhaler 3  . atenolol (TENORMIN) 25 MG tablet Take 25 mg by mouth daily.  0  . clorazepate (TRANXENE-T) 3.75 MG tablet Take 1 tablet (3.75 mg total) by mouth 2 (two) times daily as needed for anxiety. 60 tablet 5  . digoxin (LANOXIN) 0.25 MG tablet Take 0.25 mg by mouth daily.    Marland Kitchen diltiazem (CARDIZEM CD) 240 MG 24 hr capsule Take 240 mg by mouth daily.  1  . doxepin (SINEQUAN) 50 MG capsule Take 1 capsule (50 mg total) by mouth at bedtime. 30 capsule 5  . DULoxetine (CYMBALTA) 20 MG capsule Take 1 capsule (20 mg total) by mouth daily. 30 capsule 5  . furosemide (LASIX) 40 MG tablet Take 40 mg by mouth 2 (two) times daily.   1  . HYDROcodone-acetaminophen (NORCO) 10-325 MG tablet Take 1 tablet by mouth 3 (three) times daily.    . Magnesium Oxide 400 (240 Mg) MG TABS Take 1 tablet by mouth 2 (two) times daily.  1  . Melatonin 10 MG TABS Take 1 tablet by mouth at bedtime as needed (sleep).     . ondansetron (ZOFRAN) 4 MG tablet Take 4 mg by mouth every 8 (eight) hours as needed for nausea or vomiting.    . pantoprazole (PROTONIX) 40 MG tablet Take 40 mg by mouth daily.  12  . polyethylene glycol powder (GLYCOLAX/MIRALAX) powder MIX 1 CAPFUL IN WATER AND DRINK DAILY  12  . promethazine (PHENERGAN) 25 MG tablet TAKE 1/2 TABLET BY MOUTH EVERY 6 TO 8 HOURS AS NEEDED FOR NAUSEA  1  . warfarin (COUMADIN) 5 MG tablet 1/2 tab on MWF. 1 tab on T, Th,Sat, and Sun  3  . Wheat Dextrin (BENEFIBER PO) Take by mouth as directed.     No current facility-administered  medications for this visit.     Neurologic: Headache: No Seizure: No Paresthesias:No  Musculoskeletal: Strength & Muscle Tone: within normal limits Gait & Station: normal Patient leans: N/A  Psychiatric Specialty Exam: ROS  Blood pressure 128/72, pulse 84, height 5\' 6"  (1.676 m), weight 166 lb 6.4 oz (75.5 kg).Body mass index is 26.86 kg/m.  General Appearance: Casual  Eye Contact:  Good  Speech:  Clear and Coherent  Volume:  Normal  Mood:  Anxious  Affect:  Congruent  Thought Process:  Disorganized and Goal Directed  Orientation:  Negative  Thought Content:  Logical  Suicidal Thoughts:  No  Homicidal Thoughts:  No  Memory:  NA  Judgement:  Fair  Insight:  Lacking  Psychomotor Activity:  NA and Normal  Concentration:  Concentration: Fair  Recall:  Poor  Fund of Knowledge:Poor  Language: Good  Akathisia:  No  Handed:  Right  AIMS (if indicated):    Assets:  Desire for Improvement  ADL's:  Intact  Cognition: WNL  Sleep:     Gypsy Balsam, MD 4/25/20182:27 PM  Unfortunately the patient came back without her son. After the visit he wanted to speak with [place]. He shared with [place] that he thinks that her cognition is not what it should be. He's hoping it'll get better in time. Today our interventions are to start her back on doxepin for sleep. Will go for a higher dose going from 25 mg to 50 mg. My hope that she sleeps better cognitively she'll be clear. Her mood is certainly good at this time. We will however reduce her Cymbalta from 30 mg down to 20 mg. Is my hope that this will get rid of the grinding experience that she's having with her teeth and she'll be able to sleep more comfortably. She'll continue taking Tranxene 3.75 mg twice a day. This patient is not suicidal. She is functioning fairly well. She'll return to see me in about 2 months and at that time we'll likely perform a Mini-Mental status exam and reevaluate her depression. This patient is not suicidal. She has  no complaints of any physical problems other than sore jaws from grinding her teeth.

## 2016-11-30 ENCOUNTER — Encounter (HOSPITAL_COMMUNITY): Payer: Self-pay | Admitting: Clinical

## 2016-11-30 ENCOUNTER — Ambulatory Visit (INDEPENDENT_AMBULATORY_CARE_PROVIDER_SITE_OTHER): Payer: Medicare Other | Admitting: Clinical

## 2016-11-30 DIAGNOSIS — F3130 Bipolar disorder, current episode depressed, mild or moderate severity, unspecified: Secondary | ICD-10-CM | POA: Diagnosis not present

## 2016-11-30 NOTE — Progress Notes (Signed)
   THERAPIST PROGRESS NOTE  Session Time: 3:29 - 4:25  Participation Level: Active  Behavioral Response: CasualAlertNA  Type of Therapy: Individual Therapy  Treatment Goals addressed: improve psychiatric symptoms, elevate mood, improve unhelpful thought patterns, implement interpersonal relationship skills,  healthy coping skills  Interventions: CBT, Motivational Interviewing,   Summary: Theresa Mendoza is a 71 y.o. female who presents with Bipolar I disorder, most recent episode depressed.   Suicidal/Homicidal: No   without intent/plan  Therapist Response: Theresa Mendoza met with clinician for an individual therapy session. Theresa Mendoza discussed her psychiatric symptoms and her current life events. Theresa Mendoza shared that her symptoms have been a little better since last session. Clinician asked open ended questions and Theresa Mendoza shared that she had some acceptance about her situation. She shared that has lessened her depression and anxiety. She shared that she continues to feel isolated and lonely.Clinician asked open ended question about her social interaction which is limited. Clinician and client discussed the importance of social interaction. Client and clinician discussed opportunities for social interaction. Theresa Mendoza shared that she has not wanted to bother anybody by asking for rides, but has agreed to do so to go to a few opportunities to engage with others. Client and clinician discussed her negative thoughts that currently keep her from doing so. She identified the evidence for and against the thoughts. She then formulated healthier alternative thoughts. She identified approaches she could use.  Plan: Return again in 1-2 weeks.  Diagnosis: Axis I: Bipolar I disorder, most recent episode depressed     Jen Eppinger A, LCSW 11/30/2016

## 2016-12-17 ENCOUNTER — Other Ambulatory Visit (HOSPITAL_COMMUNITY): Payer: Self-pay | Admitting: Psychiatry

## 2016-12-25 ENCOUNTER — Telehealth (HOSPITAL_COMMUNITY): Payer: Self-pay

## 2016-12-25 DIAGNOSIS — F3130 Bipolar disorder, current episode depressed, mild or moderate severity, unspecified: Secondary | ICD-10-CM

## 2016-12-25 NOTE — Telephone Encounter (Signed)
Patient was here in April and she did not come with her daughter in law (who normally attends) She was unaware that you continued the Tranxene, she is still giving the patient Zyprexa and she seems to be doing fine. However patient needs a refill. She is currently taking (from you)  Doxepin 50 mg Cymbalta 20 mg  Zyprexa 5 mg  Please review and advise, thank you

## 2016-12-28 MED ORDER — OLANZAPINE 5 MG PO TABS: 2.5000 mg | ORAL_TABLET | Freq: Every day | ORAL | 0 refills | Status: DC

## 2016-12-28 NOTE — Telephone Encounter (Signed)
Met with Dr. Casimiro Needle to inform patient was still taking Olanzapine 5 mg at bedtime.  Dr. Casimiro Needle reviewed patient's current medications and past notes and requested this nurse e-scribe in a new order but for patient to cut in 1/2 at night time for a total of 2.5 mg at bedtime to see how she does prior to upcoming appointment on 02/08/17.  A new order was e-scribed to patient's [facility] on [address] as verbally authorized by Dr. Casimiro Needle for Olanzapine 5 mg, 1/2 at beditime #45 with no refills.  Message left for Ms. Tressia Danas Dr. Casimiro Needle reviewed patient's status with current medications and agreed to send in a new Olanzapine 5 mg order but wanted pt. to try cutting this back to 1/2 at bedtime and to see how she does prior to appt. 02/08/17. Informed new 90 day order e-scribed with these new instructions and patient or collateral to call back if questions.

## 2017-01-02 ENCOUNTER — Encounter (HOSPITAL_COMMUNITY): Payer: Self-pay | Admitting: Clinical

## 2017-01-02 ENCOUNTER — Ambulatory Visit (INDEPENDENT_AMBULATORY_CARE_PROVIDER_SITE_OTHER): Payer: Medicare Other | Admitting: Clinical

## 2017-01-02 DIAGNOSIS — F3130 Bipolar disorder, current episode depressed, mild or moderate severity, unspecified: Secondary | ICD-10-CM | POA: Diagnosis not present

## 2017-01-02 NOTE — Progress Notes (Signed)
   THERAPIST PROGRESS NOTE  Session Time: 1:30 - 2:00  Participation Level: Active  Behavioral Response: CasualAlertDepressed  Type of Therapy: Individual Therapy  Treatment Goals addressed: improve psychiatric symptoms,   interpersonal relationship skills, healthy coping skills  Interventions: CBT, Motivational Interviewing, psychoeducation, grounding and mindfulness techniques  Summary: Theresa Mendoza is a 71 y.o. female who presents with Bipolar I disorder, most recent episode depressed.   Suicidal/Homicidal: No   without intent/plan  Therapist Response: Theresa Mendoza met with clinician for an individual therapy session. [place] discussed her psychiatric symptoms and her current life events. Theresa Mendoza shared that she is not as depressed as she was before. Clinician asked open ended questions and [name] shared that she has had some more acceptance of her living situation. She shared that she continues to be lonely in the house but that she does enjoy the 70yrold that lives with them. Theresa Mendoza not invited on family outing and has no mode of transportation. She shared that she is afraid she will upset her family if she takes a taxi or ubber to get out of the house. Theresa Bastaand clinician discussed the importance of social interaction. She shared that she misses being social.Client and clinician discussed healthy interpersonal relationship skills she could use to communicate her needs to her family. Clinician informed Theresa Mendoza clinician would be leaving Theresa Mendoza Clinician asked open ended questions and Theresa Mendoza. Client and clinician discussed her options to continue therapy.  Plan: Return again in 1-2 weeks.  Diagnosis: Axis I: Bipolar I disorder, most recent episode depressed    Antion Andres A, LCSW 01/02/2017

## 2017-02-08 ENCOUNTER — Ambulatory Visit (HOSPITAL_COMMUNITY): Payer: Self-pay | Admitting: Psychiatry

## 2017-03-08 ENCOUNTER — Ambulatory Visit (INDEPENDENT_AMBULATORY_CARE_PROVIDER_SITE_OTHER): Payer: Medicare Other | Admitting: Psychiatry

## 2017-03-08 ENCOUNTER — Encounter (HOSPITAL_COMMUNITY): Payer: Self-pay | Admitting: Psychiatry

## 2017-03-08 VITALS — BP 122/70 | HR 73 | Ht 64.0 in | Wt 162.0 lb

## 2017-03-08 DIAGNOSIS — F17210 Nicotine dependence, cigarettes, uncomplicated: Secondary | ICD-10-CM | POA: Diagnosis not present

## 2017-03-08 DIAGNOSIS — F3342 Major depressive disorder, recurrent, in full remission: Secondary | ICD-10-CM

## 2017-03-08 DIAGNOSIS — R454 Irritability and anger: Secondary | ICD-10-CM | POA: Diagnosis not present

## 2017-03-08 DIAGNOSIS — Z810 Family history of intellectual disabilities: Secondary | ICD-10-CM

## 2017-03-08 DIAGNOSIS — F314 Bipolar disorder, current episode depressed, severe, without psychotic features: Secondary | ICD-10-CM | POA: Diagnosis not present

## 2017-03-08 MED ORDER — DULOXETINE HCL 20 MG PO CPEP: 20.0000 mg | ORAL_CAPSULE | Freq: Every day | ORAL | 5 refills | Status: DC

## 2017-03-08 MED ORDER — DOXEPIN HCL 50 MG PO CAPS: 25.0000 mg | ORAL_CAPSULE | Freq: Every day | ORAL | 5 refills | Status: DC

## 2017-03-08 MED ORDER — CLORAZEPATE DIPOTASSIUM 3.75 MG PO TABS: 3.7500 mg | ORAL_TABLET | Freq: Two times a day (BID) | ORAL | 5 refills | Status: DC | PRN

## 2017-03-08 NOTE — Progress Notes (Signed)
Psychiatric Initial Adult Assessment   Patient Identification: Theresa Mendoza MRN:  [id number] Date of Evaluation:  03/08/2017 Referral Source: Dr. Posey Pronto Chief Complaint:   Chief Complaint    Follow-up     Visit Diagnosis:    ICD-10-CM   1. Major depression, recurrent, full remission (HCC) F33.42 doxepin (SINEQUAN) 50 MG capsule    Today the patient is seen alone. Patient chooses to be seen alone. She didn't want her family to come back. She felt this was all confidence she'll. Today she had a Mini-Mental Status exam. She scored a 28 generally considered normal. Patient says she feels much better taking Tranxene twice a day. Her mood is much improved. Her anxiety level is lower. She sleeps well and eats well has good energy. She denies the use of alcohol or any drugs. She lives in a home 7 people herself. She says her son is very possessive met her leave. She says he needs my security check. The patient says since being on the Tranxene she feels like she is functioning much better. She is less dred. Patient appears very comfortable. She is very calm. She really has no complaints. She's happy with the way life is right now. The patient enjoys reading watching TV 2 cats. (Hypo) Manic Symptoms:  Irritable Mood, Anxiety Symptoms:   Psychotic Symptoms:   PTSD Symptoms:   Past Psychiatric History: 3 psychiatric hospitalizations 30 years ago. The patient also been on a few antidepressants by her primary care doctor years but she's not specific about which ones they were. She suggested that they were probably Zoloft and Prozac.  Previous Psychotropic Medications: Yes   Substance Abuse History in the last 12 months:  No.  Consequences of Substance Abuse: Negative  Past Medical History:  Past Medical History:  Diagnosis Date  . Anxiety   . Bipolar 1 disorder ([place])   . Bowel obstruction ([place])   . Cataract   . Chronic atrial fibrillation ([place]) 09/2014   On chronic anticoagulation with  coumadin.  She has failed several DCCVs in the past.  . COPD (chronic obstructive pulmonary disease) ([place])   . Dyslipidemia, goal LDL below 70 12/25/2015  . Glaucoma   . Hx of migraine headaches   . Memory loss   . Mitral regurgitation    mild to moderate on echo 09/2014  . Old MI (myocardial infarction) 09/2014   cath with normal coronary arteries and EF 50-55%  . Osteoarthritis   . Pulmonary HTN ([place]) 09/2014   Mild with PASP 49mhg at cath  . RBBB   . Rheumatoid arthritis ([place]   . Tobacco abuse     Past Surgical History:  Procedure Laterality Date  . ADENOIDECTOMY    . BACK SURGERY    . bowel obstruction    . CARDIAC CATHETERIZATION  09/2014   normal coronary arteries with mild pulmonary HTN, low normal LVF  . CHOLECYSTECTOMY    . NOSE SURGERY    . TONSILLECTOMY    . TRANSESOPHAGEAL ECHOCARDIOGRAM WITH CARDIOVERSION  09/2014  . VAGINAL HYSTERECTOMY     with BSO    Family Psychiatric History:   Family History:  Family History  Problem Relation Age of Onset  . Tuberculosis Father   . Depression Father   . Alzheimer's disease Mother   . Lung cancer Mother     Social History:   Social History   Social History  . Marital status: Widowed    Spouse name: N/A  . Number of children: N/A  .  Years of education: N/A   Social History Main Topics  . Smoking status: Current Every Day Smoker    Packs/day: 2.00    Years: 55.00    Types: Cigarettes  . Smokeless tobacco: Never Used     Comment: Has cut back to 1 pk a day from 3 a day.  . Alcohol use No  . Drug use: No  . Sexual activity: No   Other Topics Concern  . None   Social History Narrative   Lives with son and daughter in law in a 2 story home.  Has 1 son.  Retired from [employer].  Education: high school.  Widowed.    Additional Social History:   Allergies:   Allergies  Allergen Reactions  . Lithium     Patient became suicidal  . Tegretol [Carbamazepine]     Metabolic Disorder Labs: No results found for:  HGBA1C, MPG No results found for: PROLACTIN No results found for: CHOL, TRIG, HDL, CHOLHDL, VLDL, LDLCALC   Current Medications: Current Outpatient Prescriptions  Medication Sig Dispense Refill  . albuterol (PROVENTIL HFA;VENTOLIN HFA) 108 (90 Base) MCG/ACT inhaler Inhale 2 puffs into the lungs every 6 (six) hours as needed for wheezing or shortness of breath. 1 Inhaler 3  . atenolol (TENORMIN) 25 MG tablet Take 25 mg by mouth daily.  0  . clorazepate (TRANXENE-T) 3.75 MG tablet Take 1 tablet (3.75 mg total) by mouth 2 (two) times daily as needed for anxiety. 60 tablet 5  . digoxin (LANOXIN) 0.25 MG tablet Take 0.25 mg by mouth daily.    Marland Kitchen diltiazem (CARDIZEM CD) 240 MG 24 hr capsule Take 240 mg by mouth daily.  1  . doxepin (SINEQUAN) 50 MG capsule Take 1 capsule (50 mg total) by mouth at bedtime. 30 capsule 5  . DULoxetine (CYMBALTA) 20 MG capsule Take 1 capsule (20 mg total) by mouth daily. 30 capsule 5  . furosemide (LASIX) 40 MG tablet Take 40 mg by mouth 2 (two) times daily.   1  . HYDROcodone-acetaminophen (NORCO) 10-325 MG tablet Take 1 tablet by mouth 3 (three) times daily.    . Magnesium Oxide 400 (240 Mg) MG TABS Take 1 tablet by mouth 2 (two) times daily.  1  . Melatonin 10 MG TABS Take 1 tablet by mouth at bedtime as needed (sleep).     . OLANZapine (ZYPREXA) 5 MG tablet Take 0.5 tablets (2.5 mg total) by mouth at bedtime. 45 tablet 0  . ondansetron (ZOFRAN) 4 MG tablet Take 4 mg by mouth every 8 (eight) hours as needed for nausea or vomiting.    . pantoprazole (PROTONIX) 40 MG tablet Take 40 mg by mouth daily.  12  . polyethylene glycol powder (GLYCOLAX/MIRALAX) powder MIX 1 CAPFUL IN WATER AND DRINK DAILY  12  . promethazine (PHENERGAN) 25 MG tablet TAKE 1/2 TABLET BY MOUTH EVERY 6 TO 8 HOURS AS NEEDED FOR NAUSEA  1  . warfarin (COUMADIN) 5 MG tablet 1/2 tab on MWF. 1 tab on T, Th,Sat, and Sun  3  . Wheat Dextrin (BENEFIBER PO) Take by mouth as directed.     No current  facility-administered medications for this visit.     Neurologic: Headache: No Seizure: No Paresthesias:No  Musculoskeletal: Strength & Muscle Tone: within normal limits Gait & Station: normal Patient leans: N/A  Psychiatric Specialty Exam: ROS  Blood pressure 122/70, pulse 73, height 5' 4"  (1.626 m), weight 162 lb (73.5 kg).Body mass index is 27.81 kg/m.  General Appearance:  Casual  Eye Contact:  Good  Speech:  Clear and Coherent  Volume:  Normal  Mood:  Anxious  Affect:  Congruent  Thought Process:  Disorganized and Goal Directed  Orientation:  Negative  Thought Content:  Logical  Suicidal Thoughts:  No  Homicidal Thoughts:  No  Memory:  NA  Judgement:  Fair  Insight:  Lacking  Psychomotor Activity:  NA and Normal  Concentration:  Concentration: Fair  Recall:  Poor  Fund of Knowledge:Poor  Language: Good  Akathisia:  No  Handed:  Right  AIMS (if indicated):    Assets:  Desire for Improvement  ADL's:  Intact  Cognition: WNL  Sleep:     Jerral Ralph, MD 8/23/20183:54 PM  Today reviewed the patient's medicines. We reviewed all her medications including her cardiac medicine. Beta blocker and takes dig. At this time the patient takes Tranxene 3.75 mg. She's had no falls. She feels much calmer much less anxious on this medicine. She now is back on doxepin 25 mg every night and sleeps well. She also takes Cymbalta20 mg a day. He no longer is on an antipsychotic medicine. Patient is functioning very well. She'll continue on these medications are return to see me in 4 months.

## 2017-03-27 ENCOUNTER — Other Ambulatory Visit (HOSPITAL_COMMUNITY): Payer: Self-pay | Admitting: Psychiatry

## 2017-03-27 DIAGNOSIS — F3130 Bipolar disorder, current episode depressed, mild or moderate severity, unspecified: Secondary | ICD-10-CM

## 2017-04-01 ENCOUNTER — Other Ambulatory Visit (HOSPITAL_COMMUNITY): Payer: Self-pay | Admitting: Psychiatry

## 2017-04-01 DIAGNOSIS — F3130 Bipolar disorder, current episode depressed, mild or moderate severity, unspecified: Secondary | ICD-10-CM

## 2017-04-05 ENCOUNTER — Other Ambulatory Visit (HOSPITAL_COMMUNITY): Payer: Self-pay | Admitting: Psychiatry

## 2017-04-12 ENCOUNTER — Telehealth (HOSPITAL_COMMUNITY): Payer: Self-pay

## 2017-04-12 DIAGNOSIS — F3130 Bipolar disorder, current episode depressed, mild or moderate severity, unspecified: Secondary | ICD-10-CM

## 2017-04-12 MED ORDER — OLANZAPINE 5 MG PO TABS: 2.5000 mg | ORAL_TABLET | Freq: Every day | ORAL | 0 refills | Status: DC

## 2017-04-12 NOTE — Telephone Encounter (Signed)
Medication management - Called patient's home and spoke with her daughter-in-law and husband who verified patient is still taking Zyprexa 5 mg, 0.5 tablets at bedtime daily and requested a new order. Called Dr. Donell Beers who authorized a one time refill.  E-scribed in a new order for patient's Zyprexa 2.5 mg total at bedtime, #45 with [facility] refills [facility] [address]. Called back to patient's home and spoke with her daughter-in-law again to inform the new Zyprexa order had been sent [facility] as requested and that patient would need to call to schedule her next follow-up appointment with Dr. Donell Beers in 3 months.

## 2017-05-03 DIAGNOSIS — H9201 Otalgia, right ear: Secondary | ICD-10-CM | POA: Diagnosis not present

## 2017-05-03 DIAGNOSIS — I4891 Unspecified atrial fibrillation: Secondary | ICD-10-CM | POA: Diagnosis not present

## 2017-05-03 DIAGNOSIS — Z7901 Long term (current) use of anticoagulants: Secondary | ICD-10-CM | POA: Diagnosis not present

## 2017-05-03 DIAGNOSIS — J329 Chronic sinusitis, unspecified: Secondary | ICD-10-CM | POA: Diagnosis not present

## 2017-05-04 ENCOUNTER — Ambulatory Visit: Payer: Self-pay | Admitting: Adult Health

## 2017-05-25 DIAGNOSIS — Z23 Encounter for immunization: Secondary | ICD-10-CM | POA: Diagnosis not present

## 2017-05-25 DIAGNOSIS — Z5181 Encounter for therapeutic drug level monitoring: Secondary | ICD-10-CM | POA: Diagnosis not present

## 2017-05-25 DIAGNOSIS — Z7901 Long term (current) use of anticoagulants: Secondary | ICD-10-CM | POA: Diagnosis not present

## 2017-05-25 DIAGNOSIS — G894 Chronic pain syndrome: Secondary | ICD-10-CM | POA: Diagnosis not present

## 2017-06-01 DIAGNOSIS — Z7901 Long term (current) use of anticoagulants: Secondary | ICD-10-CM | POA: Diagnosis not present

## 2017-06-03 ENCOUNTER — Other Ambulatory Visit (HOSPITAL_COMMUNITY): Payer: Self-pay | Admitting: Psychiatry

## 2017-06-03 DIAGNOSIS — F3130 Bipolar disorder, current episode depressed, mild or moderate severity, unspecified: Secondary | ICD-10-CM

## 2017-06-11 ENCOUNTER — Other Ambulatory Visit (HOSPITAL_COMMUNITY): Payer: Self-pay

## 2017-06-11 DIAGNOSIS — F3130 Bipolar disorder, current episode depressed, mild or moderate severity, unspecified: Secondary | ICD-10-CM

## 2017-06-11 NOTE — Telephone Encounter (Signed)
Medication refill request. - Patient came in with [facility] requesting a refill of Zyprexa 5 mg, last ordered 04/12/17 for 1/2 tablet, #45. Pt. reported she has been taking a whole 5 mg and is now out.  Left Dr. Donell Beers a voicemail with request and to clarify if he wants her now to take a whole 5 mg or 2.5 mg as originally instructed with last order until patient returns to see him 06/20/17.  Agreed to call patient once discussed with Dr. Donell Beers.

## 2017-06-12 MED ORDER — OLANZAPINE 5 MG PO TABS: 2.5000 mg | ORAL_TABLET | Freq: Every day | ORAL | 0 refills | Status: DC

## 2017-06-12 NOTE — Telephone Encounter (Signed)
Met with Dr. Lovena Le who approved a refill of patient's Zyprexa medication and [facility] [address] this date as verbally approved.

## 2017-06-18 ENCOUNTER — Ambulatory Visit (INDEPENDENT_AMBULATORY_CARE_PROVIDER_SITE_OTHER): Payer: Medicare Other | Admitting: Adult Health

## 2017-06-18 ENCOUNTER — Encounter: Payer: Self-pay | Admitting: Adult Health

## 2017-06-18 DIAGNOSIS — J449 Chronic obstructive pulmonary disease, unspecified: Secondary | ICD-10-CM | POA: Diagnosis not present

## 2017-06-18 DIAGNOSIS — F17200 Nicotine dependence, unspecified, uncomplicated: Secondary | ICD-10-CM | POA: Diagnosis not present

## 2017-06-18 DIAGNOSIS — J9611 Chronic respiratory failure with hypoxia: Secondary | ICD-10-CM | POA: Insufficient documentation

## 2017-06-18 MED ORDER — FLUTICASONE-SALMETEROL 250-50 MCG/DOSE IN AEPB: 1.0000 | INHALATION_SPRAY | Freq: Two times a day (BID) | RESPIRATORY_TRACT | 5 refills | Status: DC

## 2017-06-18 NOTE — Assessment & Plan Note (Signed)
Smoking cessation  

## 2017-06-18 NOTE — Assessment & Plan Note (Signed)
Smoking cessation is key.  Re-trial of Advair   Plan  Patient Instructions  Restart Advair 1 puff daily , rinse after use.  Work on not smoking .  Restart  oxygen 2l/m At bedtime  .  Follow up with Dr. Isaiah Serge in 6 months and As needed

## 2017-06-18 NOTE — Progress Notes (Signed)
$RPearlFeliz KentuckyB<Hurman Horn52Hil063PearlFeliz Beama Furla Kelp>less Tobacco Never Used  Tobacco Comment   Has cut back to 1 pk a day from 3 a day.   Ready to quit: No Counseling given: Yes Comment: Has cut back to 1 pk a day from 3 a day.   Outpatient Encounter Medications as of 06/18/2017  Medication Sig  . albuterol (PROVENTIL HFA;VENTOLIN HFA) 108 (90 Base) MCG/ACT inhaler Inhale 2 puffs into the lungs every 6 (six) hours as needed for wheezing or shortness of breath.  . atenolol (TENORMIN) 25 MG tablet Take 25 mg by mouth daily.  . clorazepate (TRANXENE-T) 3.75 MG tablet Take 1 tablet (3.75 mg total) by mouth 2 (two) times daily as needed for anxiety.  . digoxin (LANOXIN) 0.25 MG tablet Take 0.25 mg by mouth daily.  . diltiazem (CABaycaTyrone Sagee Michiga[address] ACed[facility] Surgery CenterEielson Medical ClinTyrone Sagee [address]iga[address] AbbeeurmaUc Regents Dba Ucl[facility] Health [facility] [place] [place]4(587)HillisLenda KelpRange25[facility]urg Ctro0630uth  daily.  . doxepin (SINEQUAN) 50 MG capsule Take 1 capsule (50 mg total) by mouth at bedtime.  . DULoxetine (CYMBALTA) 20 MG capsule Take 1 capsule (20 mg total) by mouth daily.  . furosemide (LASIX) 40 MG tablet Take 40 mg by mouth 2 (two) times daily.   Marland Kitchen HYDROcodone-acetaminophen (NORCO) 10-325 MG tablet Take 1 tablet by mouth 3 (three) times daily.  Marland Kitchen warfarin (COUMADIN) 5 MG tablet 1/2 tab on MWF. 1 tab on T, Th,Sat, and Sun  . Fluticasone-Salmeterol (ADVAIR DISKUS) 250-50 MCG/DOSE AEPB Inhale 1 puff into the lungs 2 (two) times daily.  . Magnesium Oxide 400 (240 Mg) MG TABS Take 1 tablet by mouth 2 (two) times daily.  . Melatonin 10 MG TABS Take 1 tablet by mouth at bedtime as needed (sleep).   . OLANZapine (ZYPREXA) 5 MG tablet Take 0.5 tablets (2.5 mg total) by mouth at bedtime. (Patient not taking: Reported on  06/18/2017)  . ondansetron (ZOFRAN) 4 MG tablet Take 4 mg by mouth every 8 (eight) hours as needed for nausea or vomiting.  . pantoprazole (PROTONIX) 40 MG tablet Take 40 mg by mouth daily.  . polyethylene glycol powder (GLYCOLAX/MIRALAX) powder MIX 1 CAPFUL IN WATER AND DRINK DAILY  . promethazine (PHENERGAN) 25 MG tablet TAKE 1/2 TABLET BY MOUTH EVERY 6 TO 8 HOURS AS NEEDED FOR NAUSEA  . Wheat Dextrin (BENEFIBER PO) Take by mouth as directed.   No facility-administered encounter medications on file as of 06/18/2017.      Review of Systems  Constitutional:   No  weight loss, night sweats,  Fevers, chills, + fatigue, or  lassitude.  HEENT:   No headaches,  Difficulty swallowing,  Tooth/dental problems, or  Sore throat,                No sneezing, itching, ear ache,  +nasal congestion, post nasal drip,   CV:  No chest pain,  Orthopnea, PND, swelling in lower extremities, anasarca, dizziness, palpitations, syncope.   GI  No heartburn, indigestion, abdominal pain, nausea, vomiting, diarrhea, change in bowel habits, loss of appetite, bloody stools.   Resp:   No chest wall deformity  Skin: no rash or lesions.  GU: no dysuria, change in color of urine, no urgency or frequency.  No flank pain, no hematuria   MS:  No joint pain or swelling.  No decreased range of motion.  No back pain.    Physical Exam  BP 124/64 (BP Location: Left Arm, Cuff Size: Normal)   Pulse (!) 53   Ht 5' 5.5" (1.664 m)   Wt 157 lb (71.2 kg)   SpO2 95%   BMI 25.73 kg/m   GEN: A/Ox3; pleasant , NAD    HEENT:  [place]/AT,  EACs-clear, TMs-wnl, NOSE-clear, THROAT-clear, no lesions, no postnasal drip or exudate noted.   NECK:  Supple w/ fair ROM; no JVD; normal carotid impulses w/o bruits; no thyromegaly or nodules palpated; no lymphadenopathy.    RESP  Few trace rhonchi , . no accessory muscle use, no dullness to percussion  CARD:  RRR, no m/r/g, no peripheral edema, pulses intact, no cyanosis or  clubbing.  GI:   Soft & nt; nml bowel sounds; no organomegaly or masses detected.   Musco: Warm bil, no deformities or joint swelling noted.   Neuro: alert, no focal deficits noted.    Skin: Warm, no lesions or rashes    Lab Results:  CBC No results found for: WBC, RBC, HGB, HCT, PLT, MCV,  MCH, MCHC, RDW, LYMPHSABS, MONOABS, EOSABS, BASOSABS  BMET No results found for: NA, K, CL, CO2, GLUCOSE, BUN, CREATININE, CALCIUM, GFRNONAA, GFRAA  BNP No results found for: BNP  ProBNP No results found for: PROBNP  Imaging: No results found.   Assessment & Plan:   COPD (chronic obstructive pulmonary disease) (HCC) Smoking cessation is key.  Re-trial of Advair   Plan  Patient Instructions  Restart Advair 1 puff daily , rinse after use.  Work on not smoking .  Restart  oxygen 2l/m At bedtime  .  Follow up with Dr. Isaiah Serge in 6 months and As needed      Tobacco use disorder Smoking cessation .   Chronic respiratory failure with hypoxia (HCC) Restart O2 At bedtime   Order for new nasal cannula .      Theresa Oaks, NP 06/18/2017

## 2017-06-18 NOTE — Assessment & Plan Note (Signed)
Restart O2 At bedtime   Order for new nasal cannula .

## 2017-06-18 NOTE — Patient Instructions (Addendum)
Restart Advair 1 puff daily , rinse after use.  Work on not smoking .  Restart  oxygen 2l/m At bedtime  .  Follow up with Dr. Isaiah Serge in 6 months and As needed

## 2017-06-19 NOTE — Addendum Note (Signed)
Addended by: Boone Master E on: 06/19/2017 05:00 PM   Modules accepted: Orders

## 2017-06-20 ENCOUNTER — Ambulatory Visit (INDEPENDENT_AMBULATORY_CARE_PROVIDER_SITE_OTHER): Payer: Medicare Other | Admitting: Psychiatry

## 2017-06-20 DIAGNOSIS — Z631 Problems in relationship with in-laws: Secondary | ICD-10-CM

## 2017-06-20 DIAGNOSIS — F17210 Nicotine dependence, cigarettes, uncomplicated: Secondary | ICD-10-CM | POA: Diagnosis not present

## 2017-06-20 DIAGNOSIS — I252 Old myocardial infarction: Secondary | ICD-10-CM | POA: Diagnosis not present

## 2017-06-20 DIAGNOSIS — Z818 Family history of other mental and behavioral disorders: Secondary | ICD-10-CM | POA: Diagnosis not present

## 2017-06-20 DIAGNOSIS — F3342 Major depressive disorder, recurrent, in full remission: Secondary | ICD-10-CM | POA: Diagnosis not present

## 2017-06-20 DIAGNOSIS — F3162 Bipolar disorder, current episode mixed, moderate: Secondary | ICD-10-CM

## 2017-06-20 DIAGNOSIS — Z79899 Other long term (current) drug therapy: Secondary | ICD-10-CM | POA: Diagnosis not present

## 2017-06-20 MED ORDER — DOXEPIN HCL 50 MG PO CAPS: 25.0000 mg | ORAL_CAPSULE | Freq: Every day | ORAL | 5 refills | Status: DC

## 2017-06-20 MED ORDER — DULOXETINE HCL 40 MG PO CPEP: 20.0000 mg | ORAL_CAPSULE | Freq: Every day | ORAL | 5 refills | Status: DC

## 2017-06-20 MED ORDER — CLORAZEPATE DIPOTASSIUM 3.75 MG PO TABS: 3.7500 mg | ORAL_TABLET | Freq: Two times a day (BID) | ORAL | 5 refills | Status: DC | PRN

## 2017-06-20 NOTE — Progress Notes (Signed)
Psychiatric Initial Adult Assessment   Patient Identification: Theresa Mendoza MRN:  [id number] Date of Evaluation:  06/20/2017 Referral Source: Dr. Allena Katz Chief Complaint:    Visit Diagnosis:    ICD-10-CM   1. Major depression, recurrent, full remission (HCC) F33.42 doxepin (SINEQUAN) 50 MG capsule    Today the patient arrives 10 minutes late. Generally she's not doing well. She's having a lot of conflicts with her daughter-in-law where she lives. I suspect dilatation occurred because mother family members are living with him for the last few months but they're actually leaving shortly. I suspect is less people the house these will contact our. The patient says she sleeping fairly well she's eating well. She self she is isolated. She does acknowledge that she feels depressed. She does little for enjoyment. Note is the patient been out of therapy. The patient denies being acutely suicidal. She shows no evidence of psychosis. She drinks no alcohol and uses no drugs. Patient's thoughts are clear organized and connected. Anxiety Symptoms:   Psychotic Symptoms:   PTSD Symptoms:   Past Psychiatric History: 3 psychiatric hospitalizations 30 years ago. The patient also been on a few antidepressants by her primary care doctor years but she's not specific about which ones they were. She suggested that they were probably Zoloft and Prozac.  Previous Psychotropic Medications: Yes   Substance Abuse History in the last 12 months:  No.  Consequences of Substance Abuse: Negative  Past Medical History:  Past Medical History:  Diagnosis Date  . Anxiety   . Bipolar 1 disorder (HCC)   . Bowel obstruction (HCC)   . Cataract   . Chronic atrial fibrillation (HCC) 09/2014   On chronic anticoagulation with coumadin.  She has failed several DCCVs in the past.  . COPD (chronic obstructive pulmonary disease) (HCC)   . Dyslipidemia, goal LDL below 70 12/25/2015  . Glaucoma   . Hx of migraine headaches   .  Memory loss   . Mitral regurgitation    mild to moderate on echo 09/2014  . Old MI (myocardial infarction) 09/2014   cath with normal coronary arteries and EF 50-55%  . Osteoarthritis   . Pulmonary HTN (HCC) 09/2014   Mild with PASP at cath  . RBBB   . Rheumatoid arthritis (HCC)   . Tobacco abuse     Past Surgical History:  Procedure Laterality Date  . ADENOIDECTOMY    . BACK SURGERY    . bowel obstruction    . CARDIAC CATHETERIZATION  09/2014   normal coronary arteries with mild pulmonary HTN, low normal LVF  . CHOLECYSTECTOMY    . NOSE SURGERY    . TONSILLECTOMY    . TRANSESOPHAGEAL ECHOCARDIOGRAM WITH CARDIOVERSION  09/2014  . VAGINAL HYSTERECTOMY     with BSO    Family Psychiatric History:   Family History:  Family History  Problem Relation Age of Onset  . Tuberculosis Father   . Depression Father   . Alzheimer's disease Mother   . Lung cancer Mother     Social History:   Social History   Socioeconomic History  . Marital status: Widowed    Spouse name: Not on file  . Number of children: Not on file  . Years of education: Not on file  . Highest education level: Not on file  Social Needs  . Financial resource strain: Not on file  . Food insecurity - worry: Not on file  . Food insecurity - inability: Not on file  . Transportation  needs - medical: Not on file  . Transportation needs - non-medical: Not on file  Occupational History  . Not on file  Tobacco Use  . Smoking status: Current Every Day Smoker    Packs/day: 2.00    Years: 55.00    Pack years: 110.00    Types: Cigarettes  . Smokeless tobacco: Never Used  . Tobacco comment: Has cut back to 1 pk a day from 3 a day.  Substance and Sexual Activity  . Alcohol use: No    Alcohol/week: 0.0 oz  . Drug use: No  . Sexual activity: No  Other Topics Concern  . Not on file  Social History Narrative   Lives with son and daughter in law in a 2 story home.  Has 1 son.  Retired from [employer].  Education: high  school.  Widowed.    Additional Social History:   Allergies:   Allergies  Allergen Reactions  . Lithium     Patient became suicidal  . Tegretol [Carbamazepine]     Metabolic Disorder Labs: No results found for: HGBA1C, MPG No results found for: PROLACTIN No results found for: CHOL, TRIG, HDL, CHOLHDL, VLDL, LDLCALC   Current Medications: Current Outpatient Medications  Medication Sig Dispense Refill  . albuterol (PROVENTIL HFA;VENTOLIN HFA) 108 (90 Base) MCG/ACT inhaler Inhale 2 puffs into the lungs every 6 (six) hours as needed for wheezing or shortness of breath. 1 Inhaler 3  . atenolol (TENORMIN) 25 MG tablet Take 25 mg by mouth daily.  0  . clorazepate (TRANXENE-T) 3.75 MG tablet Take 1 tablet (3.75 mg total) by mouth 2 (two) times daily as needed for anxiety. 60 tablet 5  . digoxin (LANOXIN) 0.25 MG tablet Take 0.25 mg by mouth daily.    Marland Kitchen diltiazem (CARDIZEM CD) 240 MG 24 hr capsule Take 240 mg by mouth daily.  1  . doxepin (SINEQUAN) 50 MG capsule Take 1 capsule (50 mg total) by mouth at bedtime. 30 capsule 5  . DULoxetine HCl 40 MG CPEP Take 20 mg by mouth daily. 30 capsule 5  . Fluticasone-Salmeterol (ADVAIR DISKUS) 250-50 MCG/DOSE AEPB Inhale 1 puff into the lungs 2 (two) times daily. 60 each 5  . furosemide (LASIX) 40 MG tablet Take 40 mg by mouth 2 (two) times daily.   1  . HYDROcodone-acetaminophen (NORCO) 10-325 MG tablet Take 1 tablet by mouth 3 (three) times daily.    . Magnesium Oxide 400 (240 Mg) MG TABS Take 1 tablet by mouth 2 (two) times daily.  1  . Melatonin 10 MG TABS Take 1 tablet by mouth at bedtime as needed (sleep).     . OLANZapine (ZYPREXA) 5 MG tablet Take 0.5 tablets (2.5 mg total) by mouth at bedtime. (Patient not taking: Reported on 06/18/2017) 45 tablet 0  . ondansetron (ZOFRAN) 4 MG tablet Take 4 mg by mouth every 8 (eight) hours as needed for nausea or vomiting.    . pantoprazole (PROTONIX) 40 MG tablet Take 40 mg by mouth daily.  12  .  polyethylene glycol powder (GLYCOLAX/MIRALAX) powder MIX 1 CAPFUL IN WATER AND DRINK DAILY  12  . promethazine (PHENERGAN) 25 MG tablet TAKE 1/2 TABLET BY MOUTH EVERY 6 TO 8 HOURS AS NEEDED FOR NAUSEA  1  . warfarin (COUMADIN) 5 MG tablet 1/2 tab on MWF. 1 tab on T, Th,Sat, and Sun  3  . Wheat Dextrin (BENEFIBER PO) Take by mouth as directed.     No current facility-administered medications for this  visit.     Neurologic: Headache: No Seizure: No Paresthesias:No  Musculoskeletal: Strength & Muscle Tone: within normal limits Gait & Station: normal Patient leans: N/A  Psychiatric Specialty Exam: ROS  There were no vitals taken for this visit.There is no height or weight on file to calculate BMI.  General Appearance: Casual  Eye Contact:  Good  Speech:  Clear and Coherent  Volume:  Normal  Mood:  Anxious  Affect:  Congruent  Thought Process:  Disorganized and Goal Directed  Orientation:  Negative  Thought Content:  Logical  Suicidal Thoughts:  No  Homicidal Thoughts:  No  Memory:  NA  Judgement:  Fair  Insight:  Lacking  Psychomotor Activity:  NA and Normal  Concentration:  Concentration: Fair  Recall:  Poor  Fund of Knowledge:Poor  Language: Good  Akathisia:  No  Handed:  Right  AIMS (if indicated):    Assets:  Desire for Improvement  ADL's:  Intact  Cognition: WNL  Sleep:     Gypsy Balsam, MD 12/5/20184:59 PM  At this time the patient continue taking Tranxene3.75 twice a day. Today we'll go ahead and increase her Cymbalta dose of 20 mg to dose of 40 mg. Today most importantly we'll attempt to get her therapist. She clearly has a family issue. She refuses to allow family to come back to talk to me she wants to know nothing about her care. The patient is not homicidal she is not aggressive. She's not suicidal or self-destructive. At this time she feels isolated. She hasn't been able to drive in 5 years. I think the patient needs to be activated. Now her increasing  her Cymbalta and asking her to come back to see me in 6-7 weeks. Importantly they'll make arrangements for her to see a therapist.

## 2017-06-21 ENCOUNTER — Other Ambulatory Visit (HOSPITAL_COMMUNITY): Payer: Self-pay

## 2017-06-21 MED ORDER — DULOXETINE HCL 40 MG PO CPEP: 40.0000 mg | ORAL_CAPSULE | Freq: Every day | ORAL | 1 refills | Status: DC

## 2017-07-02 DIAGNOSIS — Z7901 Long term (current) use of anticoagulants: Secondary | ICD-10-CM | POA: Diagnosis not present

## 2017-07-24 DIAGNOSIS — Z7901 Long term (current) use of anticoagulants: Secondary | ICD-10-CM | POA: Diagnosis not present

## 2017-08-06 ENCOUNTER — Ambulatory Visit (HOSPITAL_COMMUNITY): Payer: Self-pay | Admitting: Licensed Clinical Social Worker

## 2017-08-10 IMAGING — CR DG ABDOMEN 2V
2 series · 2 of 2 positions shown · non-contrast
Comparison: None.

CLINICAL DATA: Midline abdominal pain for for review weeks

EXAM:
ABDOMEN - 2 VIEW

[w abdomen upright]
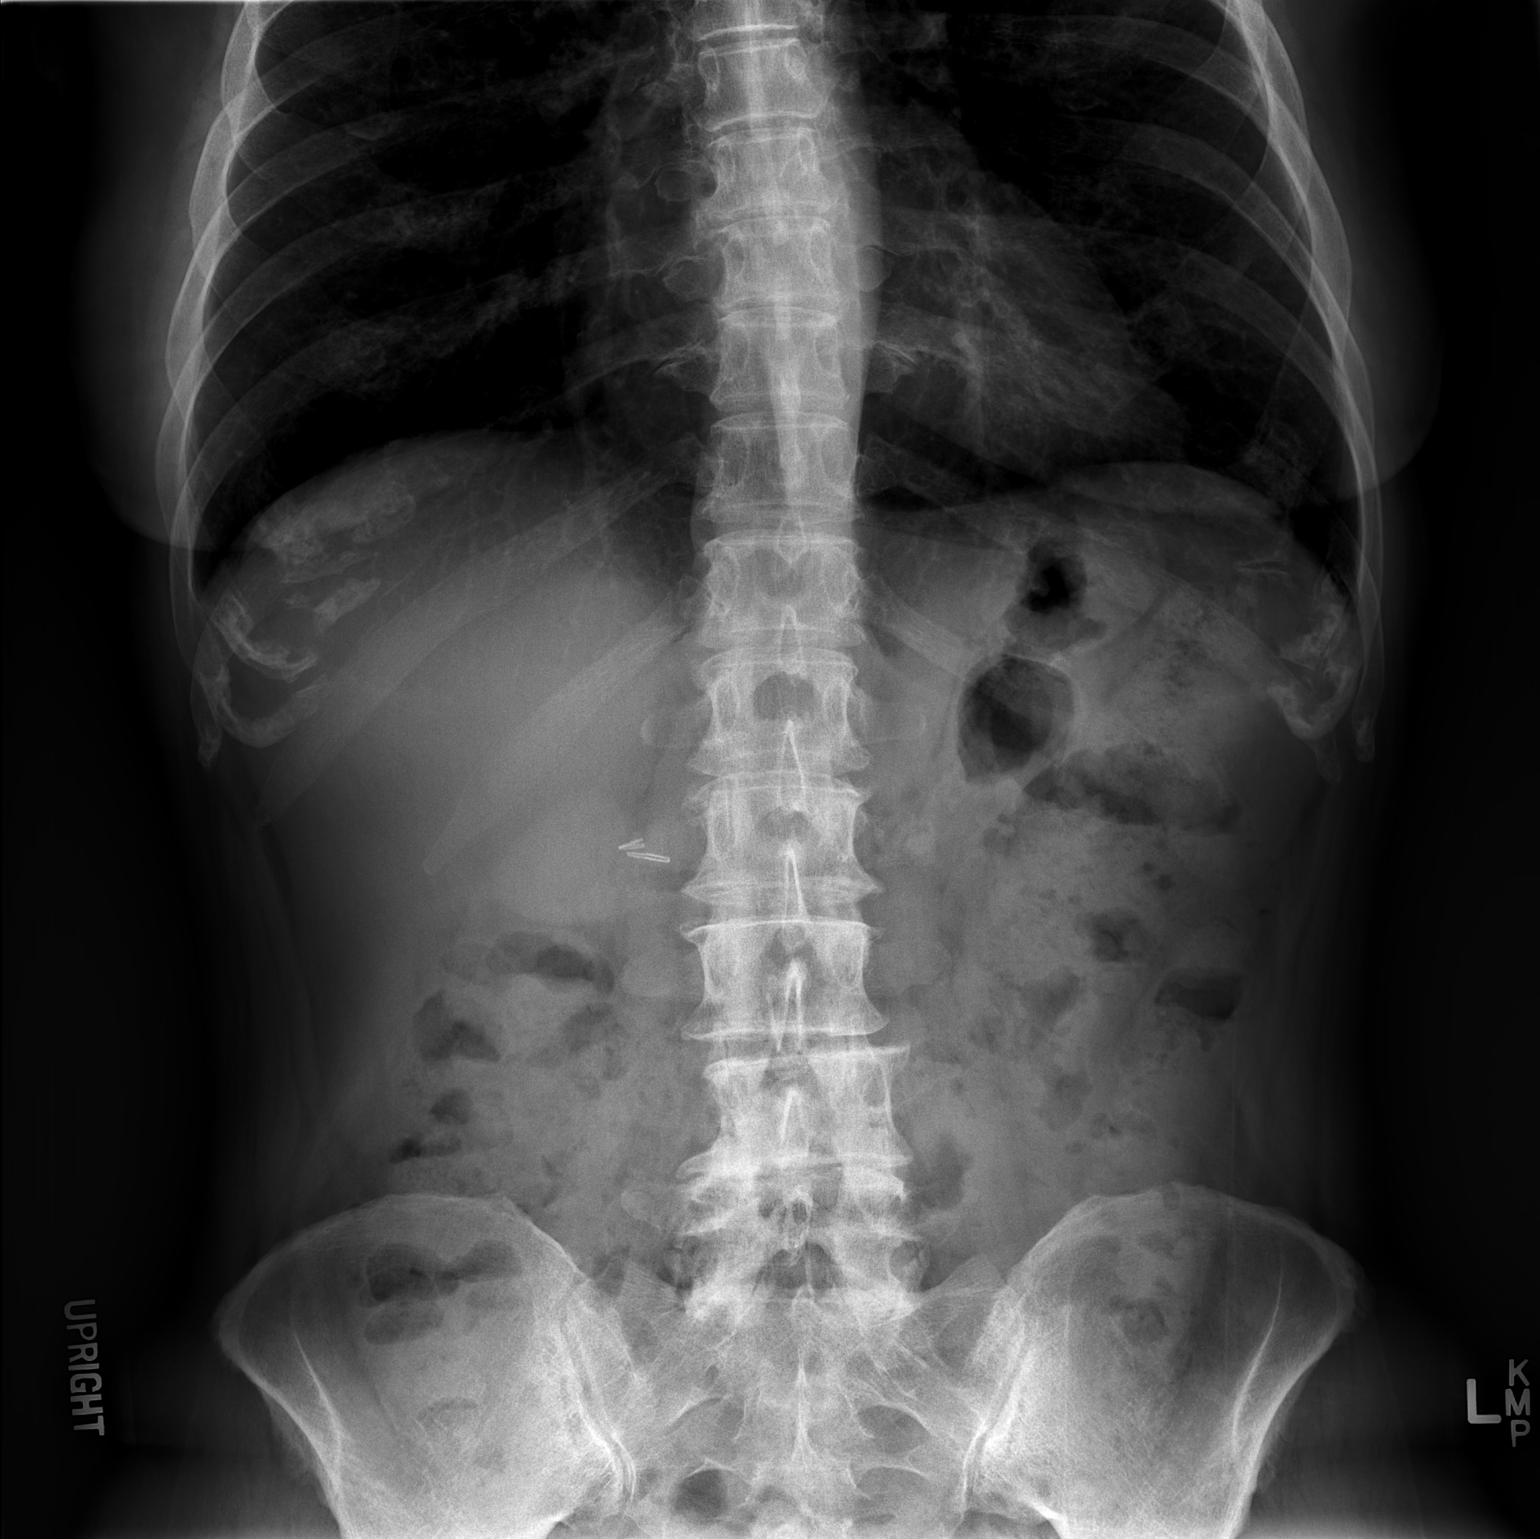

[t abdomen supine]
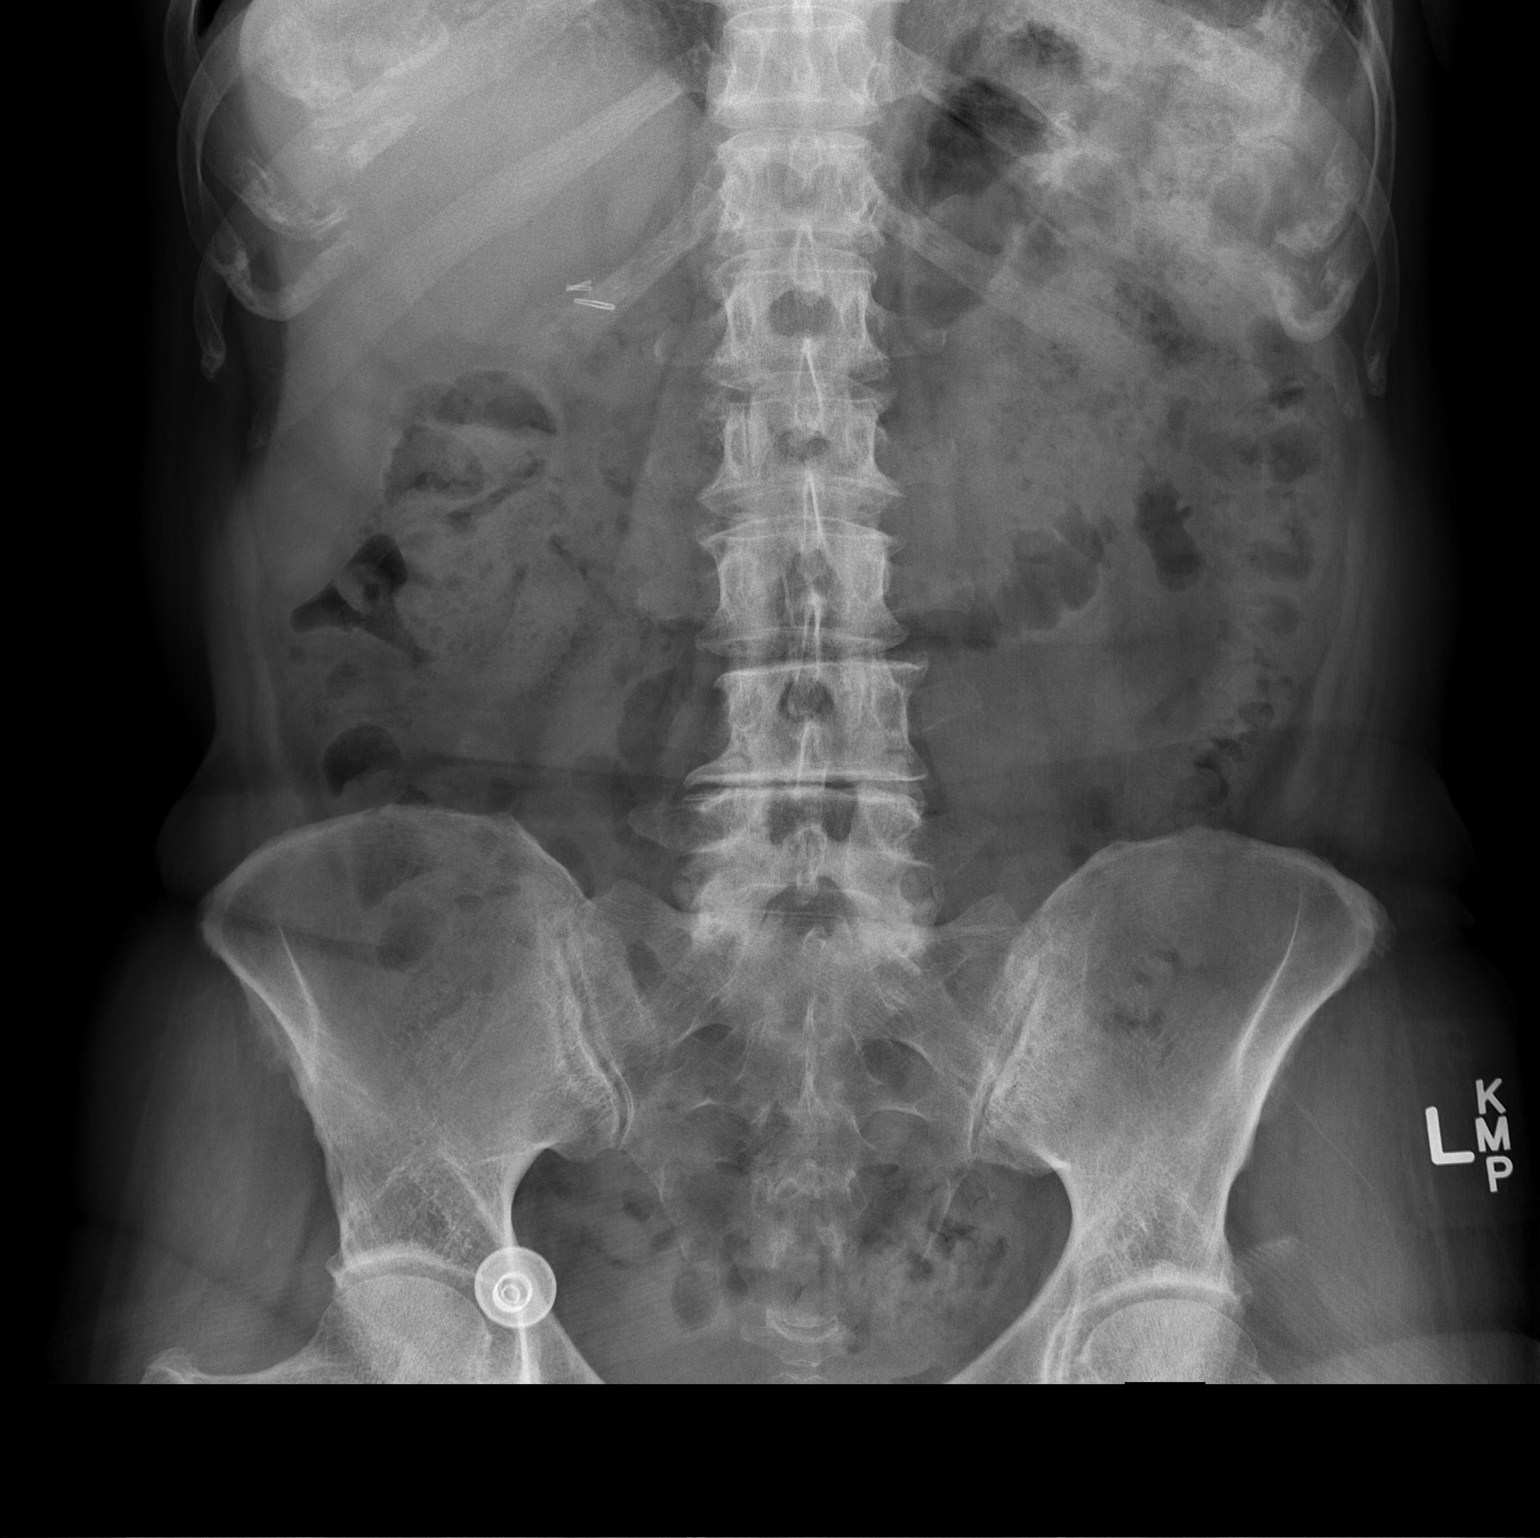

[2 of 2 positions shown; findings below may reference images not displayed]

FINDINGS: There is normal small bowel gas pattern. Some colonic gas and stool
noted in right colon and transverse colon. Postcholecystectomy
surgical clips are noted. Minimal stool in descending colon. No
evidence of free abdominal air.
IMPRESSION: Normal small bowel gas pattern. No free abdominal air. Colonic gas
and stool as described above.

## 2017-09-17 ENCOUNTER — Telehealth: Payer: Self-pay

## 2017-09-17 NOTE — Telephone Encounter (Signed)
  Pt is on Advair and does not need PA for this medication. I called her daughter  And she confirmed and didn't know why this PA was sent. Nothing further is needed.   Instructions      Return in about 6 months (around 12/17/2017) for Follow up with Dr. Isaiah Serge.  Restart Advair 1 puff daily , rinse after use.  Work on not smoking .  Restart  oxygen 2l/m At bedtime  .  Follow up with Dr. Isaiah Serge in 6 months and As needed

## 2017-09-19 ENCOUNTER — Ambulatory Visit (INDEPENDENT_AMBULATORY_CARE_PROVIDER_SITE_OTHER): Payer: Medicare Other | Admitting: Psychiatry

## 2017-09-19 DIAGNOSIS — Z818 Family history of other mental and behavioral disorders: Secondary | ICD-10-CM | POA: Diagnosis not present

## 2017-09-19 DIAGNOSIS — F17210 Nicotine dependence, cigarettes, uncomplicated: Secondary | ICD-10-CM

## 2017-09-19 DIAGNOSIS — F3342 Major depressive disorder, recurrent, in full remission: Secondary | ICD-10-CM | POA: Diagnosis not present

## 2017-09-19 DIAGNOSIS — Z810 Family history of intellectual disabilities: Secondary | ICD-10-CM | POA: Diagnosis not present

## 2017-09-19 MED ORDER — CLORAZEPATE DIPOTASSIUM 3.75 MG PO TABS: 3.7500 mg | ORAL_TABLET | Freq: Two times a day (BID) | ORAL | 4 refills | Status: DC | PRN

## 2017-09-19 MED ORDER — DULOXETINE HCL 40 MG PO CPEP: 40.0000 mg | ORAL_CAPSULE | Freq: Every day | ORAL | 1 refills | Status: DC

## 2017-09-19 MED ORDER — DOXEPIN HCL 50 MG PO CAPS: 25.0000 mg | ORAL_CAPSULE | Freq: Every day | ORAL | 5 refills | Status: DC

## 2017-09-19 NOTE — Progress Notes (Signed)
Psychiatric Initial Adult Assessment   Patient Identification: Theresa Mendoza MRN:  [id number] Date of Evaluation:  09/19/2017 Referral Source: Dr. Allena Katz Chief Complaint:    Visit Diagnosis:    ICD-10-CM   1. Major depression, recurrent, full remission (HCC) F33.42 doxepin (SINEQUAN) 50 MG capsule    Today the patient is seen alone. She refuses a letter daughter will come back. Overall the patient actually says she's doing quite well. Her mood is stable.She is sleeping and eating well. She denies anhedonia. She enjoys cable TV and has 2 cats. Patients anxiety is well-controlled. The patient has a back condition and takes hydrocodone from her primary care doctor. It is noted that her last visit we increased her Cymbalta from 20-40 she doesn't notice much of a difference. Nor are trying to make attempt to get her therapist not there is successful. Patient has congestive heart failure takes multiple psychiatric andcardiac medications. She denies the use of alcohol or drugs. She shows no evidence of psychosis patient is no complaints. She says she is doing well. His medicines have worked better for her than any other medicines in the past. Psychotic Symptoms:   PTSD Symptoms:   Past Psychiatric History: 3 psychiatric hospitalizations 30 years ago. The patient also been on a few antidepressants by her primary care doctor years but she's not specific about which ones they were. She suggested that they were probably Zoloft and Prozac.  Previous Psychotropic Medications: Yes   Substance Abuse History in the last 12 months:  No.  Consequences of Substance Abuse: Negative  Past Medical History:  Past Medical History:  Diagnosis Date  . Anxiety   . Bipolar 1 disorder (HCC)   . Bowel obstruction (HCC)   . Cataract   . Chronic atrial fibrillation (HCC) 09/2014   On chronic anticoagulation with coumadin.  She has failed several DCCVs in the past.  . COPD (chronic obstructive pulmonary disease)  (HCC)   . Dyslipidemia, goal LDL below 70 12/25/2015  . Glaucoma   . Hx of migraine headaches   . Memory loss   . Mitral regurgitation    mild to moderate on echo 09/2014  . Old MI (myocardial infarction) 09/2014   cath with normal coronary arteries and EF 50-55%  . Osteoarthritis   . Pulmonary HTN (HCC) 09/2014   Mild with PASP at cath  . RBBB   . Rheumatoid arthritis (HCC)   . Tobacco abuse     Past Surgical History:  Procedure Laterality Date  . ADENOIDECTOMY    . BACK SURGERY    . bowel obstruction    . CARDIAC CATHETERIZATION  09/2014   normal coronary arteries with mild pulmonary HTN, low normal LVF  . CHOLECYSTECTOMY    . NOSE SURGERY    . TONSILLECTOMY    . TRANSESOPHAGEAL ECHOCARDIOGRAM WITH CARDIOVERSION  09/2014  . VAGINAL HYSTERECTOMY     with BSO    Family Psychiatric History:   Family History:  Family History  Problem Relation Age of Onset  . Tuberculosis Father   . Depression Father   . Alzheimer's disease Mother   . Lung cancer Mother     Social History:   Social History   Socioeconomic History  . Marital status: Widowed    Spouse name: Not on file  . Number of children: Not on file  . Years of education: Not on file  . Highest education level: Not on file  Social Needs  . Financial resource strain: Not on file  .  Food insecurity - worry: Not on file  . Food insecurity - inability: Not on file  . Transportation needs - medical: Not on file  . Transportation needs - non-medical: Not on file  Occupational History  . Not on file  Tobacco Use  . Smoking status: Current Every Day Smoker    Packs/day: 2.00    Years: 55.00    Pack years: 110.00    Types: Cigarettes  . Smokeless tobacco: Never Used  . Tobacco comment: Has cut back to 1 pk a day from 3 a day.  Substance and Sexual Activity  . Alcohol use: No    Alcohol/week: 0.0 oz  . Drug use: No  . Sexual activity: No  Other Topics Concern  . Not on file  Social History Narrative    Lives with son and daughter in law in a 2 story home.  Has 1 son.  Retired from [employer].  Education: high school.  Widowed.    Additional Social History:   Allergies:   Allergies  Allergen Reactions  . Lithium     Patient became suicidal  . Tegretol [Carbamazepine]     Metabolic Disorder Labs: No results found for: HGBA1C, MPG No results found for: PROLACTIN No results found for: CHOL, TRIG, HDL, CHOLHDL, VLDL, LDLCALC   Current Medications: Current Outpatient Medications  Medication Sig Dispense Refill  . albuterol (PROVENTIL HFA;VENTOLIN HFA) 108 (90 Base) MCG/ACT inhaler Inhale 2 puffs into the lungs every 6 (six) hours as needed for wheezing or shortness of breath. 1 Inhaler 3  . atenolol (TENORMIN) 25 MG tablet Take 25 mg by mouth daily.  0  . clorazepate (TRANXENE-T) 3.75 MG tablet Take 1 tablet (3.75 mg total) by mouth 2 (two) times daily as needed for anxiety. 60 tablet 4  . digoxin (LANOXIN) 0.25 MG tablet Take 0.25 mg by mouth daily.    Marland Kitchen diltiazem (CARDIZEM CD) 240 MG 24 hr capsule Take 240 mg by mouth daily.  1  . doxepin (SINEQUAN) 50 MG capsule Take 1 capsule (50 mg total) by mouth at bedtime. 30 capsule 5  . DULoxetine HCl 40 MG CPEP Take 40 mg by mouth daily. 90 capsule 1  . Fluticasone-Salmeterol (ADVAIR DISKUS) 250-50 MCG/DOSE AEPB Inhale 1 puff into the lungs 2 (two) times daily. 60 each 5  . furosemide (LASIX) 40 MG tablet Take 40 mg by mouth 2 (two) times daily.   1  . HYDROcodone-acetaminophen (NORCO) 10-325 MG tablet Take 1 tablet by mouth 3 (three) times daily.    . Magnesium Oxide 400 (240 Mg) MG TABS Take 1 tablet by mouth 2 (two) times daily.  1  . Melatonin 10 MG TABS Take 1 tablet by mouth at bedtime as needed (sleep).     . OLANZapine (ZYPREXA) 5 MG tablet Take 0.5 tablets (2.5 mg total) by mouth at bedtime. (Patient not taking: Reported on 06/18/2017) 45 tablet 0  . ondansetron (ZOFRAN) 4 MG tablet Take 4 mg by mouth every 8 (eight) hours as needed for  nausea or vomiting.    . pantoprazole (PROTONIX) 40 MG tablet Take 40 mg by mouth daily.  12  . polyethylene glycol powder (GLYCOLAX/MIRALAX) powder MIX 1 CAPFUL IN WATER AND DRINK DAILY  12  . promethazine (PHENERGAN) 25 MG tablet TAKE 1/2 TABLET BY MOUTH EVERY 6 TO 8 HOURS AS NEEDED FOR NAUSEA  1  . warfarin (COUMADIN) 5 MG tablet 1/2 tab on MWF. 1 tab on T, Th,Sat, and Sun  3  .  Wheat Dextrin (BENEFIBER PO) Take by mouth as directed.     No current facility-administered medications for this visit.     Neurologic: Headache: No Seizure: No Paresthesias:No  Musculoskeletal: Strength & Muscle Tone: within normal limits Gait & Station: normal Patient leans: N/A  Psychiatric Specialty Exam: ROS  Blood pressure 126/74, pulse (!) 56, height 5' 5.5" (1.664 m), weight 150 lb (68 kg).Body mass index is 24.58 kg/m.  General Appearance: Casual  Eye Contact:  Good  Speech:  Clear and Coherent  Volume:  Normal  Mood:  Anxious  Affect:  Congruent  Thought Process:  Disorganized and Goal Directed  Orientation:  Negative  Thought Content:  Logical  Suicidal Thoughts:  No  Homicidal Thoughts:  No  Memory:  NA  Judgement:  Fair  Insight:  Lacking  Psychomotor Activity:  NA and Normal  Concentration:  Concentration: Fair  Recall:  Poor  Fund of Knowledge:Poor  Language: Good  Akathisia:  No  Handed:  Right  AIMS (if indicated):    Assets:  Desire for Improvement  ADL's:  Intact  Cognition: WNL  Sleep:     Gypsy Balsam, MD 3/6/20194:30 PM  At this time the patient is stable. She'll continue taking Tranxene just 3.75 mg twice a day. She'll continue taking Cymbalta 40 mg. I'm concerned a small amount that she takes warfarin a blood thinnerand being on SSRI is a slight risk of bleeding. Use of SSRIs can affect platelets and the presence of Coumadin bleed might be significant. Therefore we'll continue his Cymbalta as is and she is taking doxepin.the patient is happy with her  situation. She would like to be on her own but she knows that is not listed. The patient does not drive. This patient was seen again in 4 months. She's not suicidal and she is functioning fairly well.

## 2017-10-02 ENCOUNTER — Telehealth: Payer: Self-pay

## 2017-10-02 NOTE — Telephone Encounter (Signed)
Sent referral to scheduling, attached notes to march file.  

## 2017-10-11 ENCOUNTER — Ambulatory Visit (HOSPITAL_COMMUNITY): Payer: Medicare Other | Admitting: Licensed Clinical Social Worker

## 2017-11-26 ENCOUNTER — Ambulatory Visit (HOSPITAL_COMMUNITY): Payer: Medicare Other | Admitting: Licensed Clinical Social Worker

## 2017-11-28 DIAGNOSIS — Z7901 Long term (current) use of anticoagulants: Secondary | ICD-10-CM | POA: Diagnosis not present

## 2017-11-28 DIAGNOSIS — Z136 Encounter for screening for cardiovascular disorders: Secondary | ICD-10-CM | POA: Diagnosis not present

## 2017-11-28 DIAGNOSIS — Z13220 Encounter for screening for lipoid disorders: Secondary | ICD-10-CM | POA: Diagnosis not present

## 2017-11-28 DIAGNOSIS — Z1211 Encounter for screening for malignant neoplasm of colon: Secondary | ICD-10-CM | POA: Diagnosis not present

## 2017-11-28 DIAGNOSIS — Z0000 Encounter for general adult medical examination without abnormal findings: Secondary | ICD-10-CM | POA: Diagnosis not present

## 2017-11-28 DIAGNOSIS — Z5181 Encounter for therapeutic drug level monitoring: Secondary | ICD-10-CM | POA: Diagnosis not present

## 2017-11-28 DIAGNOSIS — G894 Chronic pain syndrome: Secondary | ICD-10-CM | POA: Diagnosis not present

## 2017-12-04 ENCOUNTER — Ambulatory Visit (INDEPENDENT_AMBULATORY_CARE_PROVIDER_SITE_OTHER): Payer: Medicare Other | Admitting: Cardiology

## 2017-12-04 ENCOUNTER — Encounter: Payer: Self-pay | Admitting: Cardiology

## 2017-12-04 VITALS — BP 124/70 | HR 66 | Ht 65.5 in | Wt 152.0 lb

## 2017-12-04 DIAGNOSIS — I2510 Atherosclerotic heart disease of native coronary artery without angina pectoris: Secondary | ICD-10-CM

## 2017-12-04 DIAGNOSIS — I482 Chronic atrial fibrillation: Secondary | ICD-10-CM | POA: Diagnosis not present

## 2017-12-04 DIAGNOSIS — Z79899 Other long term (current) drug therapy: Secondary | ICD-10-CM

## 2017-12-04 DIAGNOSIS — Z716 Tobacco abuse counseling: Secondary | ICD-10-CM | POA: Diagnosis not present

## 2017-12-04 DIAGNOSIS — E785 Hyperlipidemia, unspecified: Secondary | ICD-10-CM

## 2017-12-04 DIAGNOSIS — I4821 Permanent atrial fibrillation: Secondary | ICD-10-CM

## 2017-12-04 HISTORY — DX: Tobacco abuse counseling: Z71.6

## 2017-12-04 NOTE — Patient Instructions (Addendum)
Medication Instructions:  Your physician recommends that you continue on your current medications as directed. Please refer to the Current Medication list given to you today.  If you need a refill on your cardiac medications, please contact your pharmacy first.  Labwork: Today for Digoxin level   Testing/Procedures: None ordered   Follow-Up: Your physician wants you to follow-up in: 1 year with Dr. Mayford Knife. You will receive a reminder letter in the mail two months in advance. If you don't receive a letter, please call our office to schedule the follow-up appointment.  Any Other Special Instructions Will Be Listed Below (If Applicable).   Thank you for choosing [facility]    Lyda Perone, RN  [phone]  If you need a refill on your cardiac medications before your next appointment, please call your pharmacy.

## 2017-12-04 NOTE — Progress Notes (Signed)
Cardiology Office Note:    Date:  12/04/2017   ID:  Theresa Mendoza, DOB 07/20/1945, MRN [id number]  PCP:  Shirlean Mylar, MD  Cardiologist:  No primary care provider on file.    Referring MD: Shirlean Mylar, MD   Chief Complaint  Patient presents with  . Coronary Artery Disease  . Atrial Fibrillation    History of Present Illness:    Theresa Mendoza is a 72 y.o. female with a hx of obacco use, ASCAD with MI remotely and permanent atrial fibrillation on chronic anticoagulation with coumadin after feeling several cardioversions in the past.  She continues to smoke and has chronic COPD with chronic shortness of breath.  She is here today for followup and is doing well.  She denies any chest pain or pressure, SOB, DOE, PND, orthopnea, LE edema, dizziness, palpitations or syncope. She is compliant with her meds and is tolerating meds with no SE.    Past Medical History:  Diagnosis Date  . Anxiety   . Bipolar 1 disorder (HCC)   . Bowel obstruction (HCC)   . Cataract   . Chronic atrial fibrillation (HCC) 09/2014   On chronic anticoagulation with coumadin.  She has failed several DCCVs in the past.  . COPD (chronic obstructive pulmonary disease) (HCC)   . Dyslipidemia, goal LDL below 70 12/25/2015  . Encounter for tobacco use cessation counseling 12/04/2017  . Glaucoma   . Hx of migraine headaches   . Memory loss   . Mitral regurgitation    mild to moderate on echo 09/2014  . Old MI (myocardial infarction) 09/2014   cath with normal coronary arteries and EF 50-55%  . Osteoarthritis   . Pulmonary HTN (HCC) 09/2014   Mild with PASP at cath  . RBBB   . Rheumatoid arthritis (HCC)   . Tobacco abuse     Past Surgical History:  Procedure Laterality Date  . ADENOIDECTOMY    . BACK SURGERY    . bowel obstruction    . CARDIAC CATHETERIZATION  09/2014   normal coronary arteries with mild pulmonary HTN, low normal LVF  . CHOLECYSTECTOMY    . NOSE SURGERY    . TONSILLECTOMY    .  TRANSESOPHAGEAL ECHOCARDIOGRAM WITH CARDIOVERSION  09/2014  . VAGINAL HYSTERECTOMY     with BSO    Current Medications: Current Meds  Medication Sig  . albuterol (PROVENTIL HFA;VENTOLIN HFA) 108 (90 Base) MCG/ACT inhaler Inhale 2 puffs into the lungs every 6 (six) hours as needed for wheezing or shortness of breath.  Marland Kitchen atenolol (TENORMIN) 25 MG tablet Take 25 mg by mouth daily.  . clorazepate (TRANXENE-T) 3.75 MG tablet Take 1 tablet (3.75 mg total) by mouth 2 (two) times daily as needed for anxiety.  . digoxin (LANOXIN) 0.25 MG tablet Take 0.25 mg by mouth daily.  Marland Kitchen diltiazem (CARDIZEM CD) 240 MG 24 hr capsule Take 240 mg by mouth daily.  Marland Kitchen doxepin (SINEQUAN) 50 MG capsule Take 1 capsule (50 mg total) by mouth at bedtime.  . DULoxetine HCl 40 MG CPEP Take 40 mg by mouth daily.  . Fluticasone-Salmeterol (ADVAIR DISKUS) 250-50 MCG/DOSE AEPB Inhale 1 puff into the lungs 2 (two) times daily.  . furosemide (LASIX) 40 MG tablet Take 40 mg by mouth 2 (two) times daily.   Marland Kitchen HYDROcodone-acetaminophen (NORCO) 10-325 MG tablet Take 1 tablet by mouth 3 (three) times daily.  Marland Kitchen warfarin (COUMADIN) 5 MG tablet 1/2 tab on MWF. 1 tab on T, Th,Sat, and Sun  Allergies:   Lithium and Tegretol [carbamazepine]   Social History   Socioeconomic History  . Marital status: Widowed    Spouse name: Not on file  . Number of children: Not on file  . Years of education: Not on file  . Highest education level: Not on file  Occupational History  . Not on file  Social Needs  . Financial resource strain: Not on file  . Food insecurity:    Worry: Not on file    Inability: Not on file  . Transportation needs:    Medical: Not on file    Non-medical: Not on file  Tobacco Use  . Smoking status: Current Every Day Smoker    Packs/day: 2.00    Years: 55.00    Pack years: 110.00    Types: Cigarettes  . Smokeless tobacco: Never Used  . Tobacco comment: Has cut back to 1 pk a day from 3 a day.  Substance and  Sexual Activity  . Alcohol use: No    Alcohol/week: 0.0 oz  . Drug use: No  . Sexual activity: Never  Lifestyle  . Physical activity:    Days per week: Not on file    Minutes per session: Not on file  . Stress: Not on file  Relationships  . Social connections:    Talks on phone: Not on file    Gets together: Not on file    Attends religious service: Not on file    Active member of club or organization: Not on file    Attends meetings of clubs or organizations: Not on file    Relationship status: Not on file  Other Topics Concern  . Not on file  Social History Narrative   Lives with son and daughter in law in a 2 story home.  Has 1 son.  Retired from [employer].  Education: high school.  Widowed.     Family History: The patient's family history includes Alzheimer's disease in her mother; Depression in her father; Lung cancer in her mother; Tuberculosis in her father.  ROS:   Please see the history of present illness.    ROS  All other systems reviewed and negative.   EKGs/Labs/Other Studies Reviewed:    The following studies were reviewed today:   EKG:  EKG is  ordered today.  The ekg ordered today demonstrates atrial fibrillation at 66 bpm with right bundle branch block, LVH with QRS widening and anterior infarct  Recent Labs: No results found for requested labs within last 8760 hours.   Recent Lipid Panel No results found for: CHOL, TRIG, HDL, CHOLHDL, VLDL, LDLCALC, LDLDIRECT  Physical Exam:    VS:  BP 124/70   Pulse 66   Ht 5' 5.5" (1.664 m)   Wt 152 lb (68.9 kg)   BMI 24.91 kg/m     Wt Readings from Last 3 Encounters:  12/04/17 152 lb (68.9 kg)  06/18/17 157 lb (71.2 kg)  11/02/16 165 lb (74.8 kg)     GEN:  Well nourished, well developed in no acute distress HEENT: Normal NECK: No JVD; No carotid bruits LYMPHATICS: No lymphadenopathy CARDIAC: RRR, no murmurs, rubs, gallops RESPIRATORY: Scattered inspiratory and expiratory wheezes heard both anteriorly and  posteriorly ABDOMEN: Soft, non-tender, non-distended MUSCULOSKELETAL:  No edema; No deformity  SKIN: Warm and dry NEUROLOGIC:  Alert and oriented x 3 PSYCHIATRIC:  Normal affect   ASSESSMENT:    1. Coronary artery disease involving native coronary artery of native heart without angina pectoris  2. Permanent atrial fibrillation (HCC)   3. Dyslipidemia, goal LDL below 70   4. Encounter for tobacco use cessation counseling   5. Medication management    PLAN:    In order of problems listed above:  1.  ASCAD - status post prior infarct with cath apparently showing normal coronary arteries.  He denies any chest pain or shortness of breath.  2.  Permanent atrial fibrillation.  She is well rate controlled on EKG today at 66 bpm.  She will continue on digoxin 0.25 mg daily, Cardizem CD 240 mg daily, atenolol 25 mg daily and warfarin.  I will check a digoxin level today.  3.  Dyslipidemia -is followed by his PCP and is currently not on lipid-lowering agents.  4.  Tobacco abuse -ongoing and encouraged to cut back and quit ultimately altogether.  She has wheezing on exam from her COPD and she understands that the smoking is detrimental to her lungs and heart but she is not ready to quit.  Medication Adjustments/Labs and Tests Ordered: Current medicines are reviewed at length with the patient today.  Concerns regarding medicines are outlined above.  Orders Placed This Encounter  Procedures  . Digoxin level  . EKG 12-Lead   No orders of the defined types were placed in this encounter.   Signed, Armanda Magic, MD  12/04/2017 3:24 PM     [facility]

## 2017-12-05 ENCOUNTER — Telehealth: Payer: Self-pay | Admitting: Cardiology

## 2017-12-05 DIAGNOSIS — Z79899 Other long term (current) drug therapy: Secondary | ICD-10-CM

## 2017-12-05 DIAGNOSIS — R7889 Finding of other specified substances, not normally found in blood: Secondary | ICD-10-CM

## 2017-12-05 LAB — DIGOXIN LEVEL: Digoxin, Serum: 1.9 ng/mL — ABNORMAL HIGH (ref 0.5–0.9)

## 2017-12-05 NOTE — Telephone Encounter (Signed)
Notes recorded by Phineas Semen, RN on 12/05/2017 at 4:51 PM EDT Patient's daughter in law instructed to hold dig due to elevated dig level and repeat lab on Friday. (dpr on file) She stated understanding and stated she could have patient come on Friday for repeat lab.   Notes recorded by Quintella Reichert, MD on 12/05/2017 at 8:59 AM EDT Dig level too high - hold dig and repeat level on Friday

## 2017-12-05 NOTE — Telephone Encounter (Signed)
Pt's daughter calling   Stated she is returning call to nurse concerning her mom

## 2017-12-07 ENCOUNTER — Other Ambulatory Visit: Payer: Medicare Other | Admitting: *Deleted

## 2017-12-07 DIAGNOSIS — Z79899 Other long term (current) drug therapy: Secondary | ICD-10-CM

## 2017-12-07 DIAGNOSIS — R7889 Finding of other specified substances, not normally found in blood: Secondary | ICD-10-CM

## 2017-12-08 LAB — DIGOXIN LEVEL: DIGOXIN, SERUM: 1.2 ng/mL — AB (ref 0.5–0.9)

## 2017-12-11 ENCOUNTER — Telehealth: Payer: Self-pay

## 2017-12-11 DIAGNOSIS — R7889 Finding of other specified substances, not normally found in blood: Secondary | ICD-10-CM

## 2017-12-11 NOTE — Telephone Encounter (Signed)
Notes recorded by Phineas Semen, RN on 12/11/2017 at 9:52 AM EDT Pt's daughter made aware of lab results.(dpr on file) Instructed her to continue to hold digoxin and repeat digoxin again in 1 week. I also advised her to check HR daily and call office with HR readings in 1 week. She stated understanding and thankful for the call. Pt scheduled for repeat lab on 12/14/17.   Notes recorded by Quintella Reichert, MD on 12/10/2017 at 7:07 PM EDT Continue to hold digoxin as level still up even after holding for 3 days. Repeat dig level in 1 week. Please have patient check her HR daily for a week and call with results

## 2017-12-13 ENCOUNTER — Other Ambulatory Visit: Payer: Self-pay | Admitting: Adult Health

## 2017-12-14 ENCOUNTER — Other Ambulatory Visit: Payer: Medicare Other

## 2017-12-14 DIAGNOSIS — I2510 Atherosclerotic heart disease of native coronary artery without angina pectoris: Secondary | ICD-10-CM | POA: Diagnosis not present

## 2017-12-14 DIAGNOSIS — R7889 Finding of other specified substances, not normally found in blood: Secondary | ICD-10-CM | POA: Diagnosis not present

## 2017-12-14 DIAGNOSIS — I4891 Unspecified atrial fibrillation: Secondary | ICD-10-CM | POA: Diagnosis not present

## 2017-12-14 DIAGNOSIS — Z79899 Other long term (current) drug therapy: Secondary | ICD-10-CM | POA: Diagnosis not present

## 2017-12-15 LAB — DIGOXIN LEVEL: Digoxin, Serum: <0.4 ng/mL — ABNORMAL LOW (ref 0.5–0.9)

## 2017-12-17 ENCOUNTER — Telehealth: Payer: Self-pay

## 2017-12-17 DIAGNOSIS — Z79899 Other long term (current) drug therapy: Secondary | ICD-10-CM

## 2017-12-17 NOTE — Telephone Encounter (Signed)
Notes recorded by Phineas Semen, RN on 12/17/2017 at 10:43 AM EDT [name] pt's daughter-in-law made aware of digoxin results. Instructed her to have pt restart her digoxin and repeat digoxin level in 1 week, she stated understanding and thankful for the call. Lab scheduled for 12/24/17   Notes recorded by Quintella Reichert, MD on 12/15/2017 at 4:03 PM EDT Ok to restart Digoxin at 0.125mg  daily and repeat BMET in 1 week

## 2017-12-24 ENCOUNTER — Other Ambulatory Visit: Payer: Medicare Other | Admitting: *Deleted

## 2017-12-24 DIAGNOSIS — Z79899 Other long term (current) drug therapy: Secondary | ICD-10-CM

## 2017-12-25 LAB — DIGOXIN LEVEL: DIGOXIN, SERUM: 1.6 ng/mL — AB (ref 0.5–0.9)

## 2017-12-26 ENCOUNTER — Telehealth: Payer: Self-pay | Admitting: Cardiology

## 2017-12-26 DIAGNOSIS — R7889 Finding of other specified substances, not normally found in blood: Secondary | ICD-10-CM

## 2017-12-26 DIAGNOSIS — Z79899 Other long term (current) drug therapy: Secondary | ICD-10-CM

## 2017-12-26 NOTE — Telephone Encounter (Signed)
New message    Daughter calling to report dosage of Digoxin is 250 mcg, once a day

## 2017-12-27 MED ORDER — DIGOXIN 125 MCG PO TABS: 0.1250 mg | ORAL_TABLET | Freq: Every day | ORAL | 3 refills | Status: DC

## 2017-12-27 NOTE — Telephone Encounter (Signed)
I spoke with Theresa Mendoza, informed her to stop digoxin and repeat labs Monday. I explained that digoxin was suppose to be decrease from .25 mg to 0.125mg  daily. She stated understanding. Order sent to pharmacy for digoxin 0.125 mg daily. I explained to start new prescription after our office calls with repeat dig level results.  Pt scheduled for repeat dig level on Monday. She stated understanding and thankful for the call

## 2017-12-27 NOTE — Telephone Encounter (Signed)
See lab note from 5/31 - patient was instructed to restart dig only at 0.125mg  daily - that is why level is elevated again.  Please hold dig and repeat dig level on monday

## 2017-12-27 NOTE — Telephone Encounter (Signed)
New message    Pt daughter is calling to follow up on message left for the nurse yesterday regarding the dose of digoxin. Please call.

## 2017-12-31 ENCOUNTER — Other Ambulatory Visit: Payer: Medicare Other | Admitting: *Deleted

## 2017-12-31 DIAGNOSIS — R7889 Finding of other specified substances, not normally found in blood: Secondary | ICD-10-CM

## 2017-12-31 DIAGNOSIS — Z79899 Other long term (current) drug therapy: Secondary | ICD-10-CM

## 2018-01-01 ENCOUNTER — Telehealth: Payer: Self-pay

## 2018-01-01 DIAGNOSIS — Z7901 Long term (current) use of anticoagulants: Secondary | ICD-10-CM | POA: Diagnosis not present

## 2018-01-01 DIAGNOSIS — Z79899 Other long term (current) drug therapy: Secondary | ICD-10-CM

## 2018-01-01 LAB — DIGOXIN LEVEL: DIGOXIN, SERUM: 0.7 ng/mL (ref 0.5–0.9)

## 2018-01-01 NOTE — Telephone Encounter (Signed)
Notes recorded by Phineas Semen, RN on 01/01/2018 at 3:40 PM EDT Pt made aware of digoxin level and instructed her to start Digoxin 0.125 mg once a day and pt is scheduled for repeat digoxin level on 01/10/18. She stated understanding and thankful for the call  Notes recorded by Quintella Reichert, MD on 01/01/2018 at 9:28 AM EDT Normal and now ok to start Digoxin 0.125mg  daily and repeat dig level in 1 week

## 2018-01-02 ENCOUNTER — Ambulatory Visit (INDEPENDENT_AMBULATORY_CARE_PROVIDER_SITE_OTHER): Payer: Medicare Other | Admitting: Licensed Clinical Social Worker

## 2018-01-02 ENCOUNTER — Encounter (HOSPITAL_COMMUNITY): Payer: Self-pay | Admitting: Licensed Clinical Social Worker

## 2018-01-02 DIAGNOSIS — F3342 Major depressive disorder, recurrent, in full remission: Secondary | ICD-10-CM | POA: Diagnosis not present
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
## 2018-01-02 NOTE — Progress Notes (Signed)
   THERAPIST PROGRESS NOTE  Session Time: 4:30pm-5:30pm  Participation Level: Active  Behavioral Response: CasualAlertEuthymic  Type of Therapy: Individual Therapy  Treatment Goals addressed: Coping  Interventions: CBT and Motivational Interviewing  Summary: Theresa Mendoza is a 72 y.o. female who presents with major depressive disorder in remission.   Suicidal/Homicidal: Nowithout intent/plan  Therapist Response: Theresa Mendoza engaged well in CCA.  Theresa Mendoza reports that she is not experiencing any depressive symptoms at this time due to medication stability.  Theresa Mendoza reports that she'd like to participate in outpatient therapy for help with coping skills.  Additionally, Theresa Mendoza reports that she wishes her home life could change.  But, she sees no way out at this time.  Her son has POA and he controls her finances. Plan: Return again in 4 weeks.  Diagnosis: Major Depressive Disorder in remission.  Theresa Heck Verdel, LCSW 01/02/2018

## 2018-01-08 DIAGNOSIS — Z7901 Long term (current) use of anticoagulants: Secondary | ICD-10-CM | POA: Diagnosis not present

## 2018-01-10 ENCOUNTER — Other Ambulatory Visit: Payer: Medicare Other | Admitting: *Deleted

## 2018-01-10 DIAGNOSIS — Z79899 Other long term (current) drug therapy: Secondary | ICD-10-CM

## 2018-01-10 LAB — DIGOXIN LEVEL: Digoxin, Serum: 0.8 ng/mL (ref 0.5–0.9)

## 2018-01-23 ENCOUNTER — Other Ambulatory Visit (HOSPITAL_COMMUNITY): Payer: Self-pay

## 2018-01-23 DIAGNOSIS — F3342 Major depressive disorder, recurrent, in full remission: Secondary | ICD-10-CM

## 2018-01-23 MED ORDER — DOXEPIN HCL 50 MG PO CAPS: 50.0000 mg | ORAL_CAPSULE | Freq: Every day | ORAL | 1 refills | Status: DC

## 2018-01-24 ENCOUNTER — Ambulatory Visit (HOSPITAL_COMMUNITY): Payer: Self-pay | Admitting: Psychiatry

## 2018-02-07 ENCOUNTER — Ambulatory Visit (INDEPENDENT_AMBULATORY_CARE_PROVIDER_SITE_OTHER): Payer: Medicare Other | Admitting: Licensed Clinical Social Worker

## 2018-02-07 ENCOUNTER — Encounter (HOSPITAL_COMMUNITY): Payer: Self-pay | Admitting: Licensed Clinical Social Worker

## 2018-02-07 DIAGNOSIS — F3342 Major depressive disorder, recurrent, in full remission: Secondary | ICD-10-CM | POA: Diagnosis not present

## 2018-02-07 NOTE — Progress Notes (Signed)
   THERAPIST PROGRESS NOTE  Session Time: 3:45pm-4:30pm  Participation Level: Active  Behavioral Response: NeatAlertEuthymic  Type of Therapy: Individual Therapy  Treatment Goals addressed: improve psychiatric symptoms, elevate mood (increased appetite, decreased isolation, increased social interaction, increased self-esteem), improve unhelpful thought patterns, implement interpersonal relationship (increased attentiveness, more honest communication, decreased anger), controlled behavior, moderated mood, deliberate speech, deliberate thought (improved social functioning), learn about diagnosis, healthy coping skills  Interventions: CBT, Motivational Interviewing, psychoeducation, grounding and mindfulness techniques  Summary: Theresa Mendoza is a 72 y.o. female who presents with Major Depressive Disorder, recurrent, full remission   Suicidal/Homicidal: No   without intent/plan  Therapist Response: Theresa Mendoza met with clinician for an individual therapy session. Theresa Mendoza discussed her psychiatric symptoms and her current life events. Theresa Mendoza shared ongoing unhappiness living in her son's home. However, she reports she has been "laying low, sitting outside and reading, and then going up to her room when her son comes home". She reports her son has power of attorney over her and will not allow her to leave and move into her own apartment or go to an assisted living place. Clinician explored her relationship with son and identified some concern about son being aggressive toward her if he got angry at her. She also reports some fear, but reports that as long as she is polite and invisible, everything is fine.  Theresa Mendoza identified a plan to go to [facility] to visit brother, in which she hopes to see a lawyer to transfer power of attorney to brother.   Plan: Return again in 6-8 weeks  Diagnosis: Axis I: Major Depressive Disorder, recurrent, full remission  Mindi Curling, LCSW 02/07/2018

## 2018-02-18 DIAGNOSIS — Z7901 Long term (current) use of anticoagulants: Secondary | ICD-10-CM | POA: Diagnosis not present

## 2018-02-28 ENCOUNTER — Telehealth (HOSPITAL_COMMUNITY): Payer: Self-pay

## 2018-02-28 NOTE — Telephone Encounter (Signed)
Patient needs a refill on Clorazepate 3.75mg .  Refill to be sent to [facility] [address].

## 2018-03-04 ENCOUNTER — Ambulatory Visit (HOSPITAL_COMMUNITY): Payer: Self-pay | Admitting: Licensed Clinical Social Worker

## 2018-03-06 ENCOUNTER — Encounter (HOSPITAL_COMMUNITY): Payer: Self-pay | Admitting: Psychiatry

## 2018-03-06 ENCOUNTER — Ambulatory Visit (INDEPENDENT_AMBULATORY_CARE_PROVIDER_SITE_OTHER): Payer: Medicare Other | Admitting: Psychiatry

## 2018-03-06 VITALS — BP 140/60 | HR 78 | Ht 65.5 in | Wt 155.8 lb

## 2018-03-06 DIAGNOSIS — F330 Major depressive disorder, recurrent, mild: Secondary | ICD-10-CM

## 2018-03-06 DIAGNOSIS — I2510 Atherosclerotic heart disease of native coronary artery without angina pectoris: Secondary | ICD-10-CM

## 2018-03-06 MED ORDER — DULOXETINE HCL 40 MG PO CPEP
40.0000 mg | ORAL_CAPSULE | Freq: Every day | ORAL | 1 refills | Status: DC
Start: 2018-03-06 — End: 2018-07-31

## 2018-03-06 NOTE — Progress Notes (Signed)
Psychiatric Initial Adult Assessment   Patient Identification: Valeree Leidy MRN:  [id number] Date of Evaluation:  03/06/2018 Referral Source: Dr. Allena Katz Chief Complaint:    Visit Diagnosis:  Major depression recurrent mild No diagnosis found.  Today the patient is seen with her son [name]. The patient clearly is different. She is improved. She is more tolerant. She's less irritable and seems to be getting along better with her family. Her son says his 2 cycles. Sometimes she her mood is stable as it is now another time she has periods where she gets and is depressed. He doesn't seem to be any different than states of being moody or irritable. The patient denies daily persistent depression. She sleeping and eating very well. She got good energy. Her thoughts are clear. She shows no evidence. She has congestive heart failure she shows no evidence of shortness of breath or chest pain. Patient has some mild nausea but no abdominal pain. The patient is thinking and concentrating well. She drinks no alcohol uses no drugs. She enjoys. The patient has chronic pain but is well-controlled with hydrocodone. She sees a pain specialist regular basis. Taking hydrocodone she is almost pain-free. I suspect the Cymbalta I supply also might contribute to reduce pain. Patient is positive and optimistic. She is looking for to a trip to [place]. Her depression is much improved. She is more tolerable.  Past Psychiatric History: 3 psychiatric hospitalizations 30 years ago. The patient also been on a few antidepressants by her primary care doctor years but she's not specific about which ones they were. She suggested that they were probably Zoloft and Prozac.  Previous Psychotropic Medications: Yes   Substance Abuse History in the last 12 months:  No.  Consequences of Substance Abuse: Negative  Past Medical History:  Past Medical History:  Diagnosis Date  . Anxiety   . Bipolar 1 disorder (HCC)   . Bowel  obstruction (HCC)   . Cataract   . Chronic atrial fibrillation (HCC) 09/2014   On chronic anticoagulation with coumadin.  She has failed several DCCVs in the past.  . COPD (chronic obstructive pulmonary disease) (HCC)   . Dyslipidemia, goal LDL below 70 12/25/2015  . Encounter for tobacco use cessation counseling 12/04/2017  . Glaucoma   . Hx of migraine headaches   . Memory loss   . Mitral regurgitation    mild to moderate on echo 09/2014  . Old MI (myocardial infarction) 09/2014   cath with normal coronary arteries and EF 50-55%  . Osteoarthritis   . Pulmonary HTN (HCC) 09/2014   Mild with PASP at cath  . RBBB   . Rheumatoid arthritis (HCC)   . Tobacco abuse     Past Surgical History:  Procedure Laterality Date  . ADENOIDECTOMY    . BACK SURGERY    . bowel obstruction    . CARDIAC CATHETERIZATION  09/2014   normal coronary arteries with mild pulmonary HTN, low normal LVF  . CHOLECYSTECTOMY    . NOSE SURGERY    . TONSILLECTOMY    . TRANSESOPHAGEAL ECHOCARDIOGRAM WITH CARDIOVERSION  09/2014  . VAGINAL HYSTERECTOMY     with BSO    Family Psychiatric History:   Family History:  Family History  Problem Relation Age of Onset  . Tuberculosis Father   . Depression Father   . Alzheimer's disease Mother   . Lung cancer Mother     Social History:   Social History   Socioeconomic History  . Marital status: Widowed  Spouse name: Not on file  . Number of children: Not on file  . Years of education: Not on file  . Highest education level: Not on file  Occupational History  . Not on file  Social Needs  . Financial resource strain: Not on file  . Food insecurity:    Worry: Not on file    Inability: Not on file  . Transportation needs:    Medical: Not on file    Non-medical: Not on file  Tobacco Use  . Smoking status: Current Every Day Smoker    Packs/day: 2.00    Years: 55.00    Pack years: 110.00    Types: Cigarettes  . Smokeless tobacco: Never Used  .  Tobacco comment: Has cut back to 1 pk a day from 3 a day.  Substance and Sexual Activity  . Alcohol use: No    Alcohol/week: 0.0 standard drinks  . Drug use: No  . Sexual activity: Never  Lifestyle  . Physical activity:    Days per week: Not on file    Minutes per session: Not on file  . Stress: Not on file  Relationships  . Social connections:    Talks on phone: Not on file    Gets together: Not on file    Attends religious service: Not on file    Active member of club or organization: Not on file    Attends meetings of clubs or organizations: Not on file    Relationship status: Not on file  Other Topics Concern  . Not on file  Social History Narrative   Lives with son and daughter in law in a 2 story home.  Has 1 son.  Retired from [employer].  Education: high school.  Widowed.    Additional Social History:   Allergies:   Allergies  Allergen Reactions  . Lithium     Patient became suicidal  . Tegretol [Carbamazepine]     Metabolic Disorder Labs: No results found for: HGBA1C, MPG No results found for: PROLACTIN No results found for: CHOL, TRIG, HDL, CHOLHDL, VLDL, LDLCALC   Current Medications: Current Outpatient Medications  Medication Sig Dispense Refill  . ADVAIR DISKUS 250-50 MCG/DOSE AEPB TAKE 1 PUFF BY MOUTH TWICE A DAY 60 each 5  . albuterol (PROVENTIL HFA;VENTOLIN HFA) 108 (90 Base) MCG/ACT inhaler Inhale 2 puffs into the lungs every 6 (six) hours as needed for wheezing or shortness of breath. 1 Inhaler 3  . atenolol (TENORMIN) 25 MG tablet Take 25 mg by mouth daily.  0  . clorazepate (TRANXENE-T) 3.75 MG tablet Take 1 tablet (3.75 mg total) by mouth 2 (two) times daily as needed for anxiety. 60 tablet 4  . digoxin (LANOXIN) 0.125 MG tablet Take 1 tablet (0.125 mg total) by mouth daily. 90 tablet 3  . diltiazem (CARDIZEM CD) 240 MG 24 hr capsule Take 240 mg by mouth daily.  1  . doxepin (SINEQUAN) 50 MG capsule Take 1 capsule (50 mg total) by mouth at bedtime. 30  capsule 1  . DULoxetine HCl 40 MG CPEP Take 40 mg by mouth daily. 90 capsule 1  . furosemide (LASIX) 40 MG tablet Take 40 mg by mouth 2 (two) times daily.   1  . HYDROcodone-acetaminophen (NORCO) 10-325 MG tablet Take 1 tablet by mouth 3 (three) times daily.    Marland Kitchen warfarin (COUMADIN) 5 MG tablet 1/2 tab on MWF. 1 tab on T, Th,Sat, and Sun  3   No current facility-administered medications for this  visit.     Neurologic: Headache: No Seizure: No Paresthesias:No  Musculoskeletal: Strength & Muscle Tone: within normal limits Gait & Station: normal Patient leans: N/A  Psychiatric Specialty Exam: ROS  Blood pressure 140/60, pulse 78, height 5' 5.5" (1.664 m), weight 155 lb 12.8 oz (70.7 kg).Body mass index is 25.53 kg/m.  General Appearance: Casual  Eye Contact:  Good  Speech:  Clear and Coherent  Volume:  Normal  Mood:  Anxious  Affect:  Congruent  Thought Process:  Disorganized and Goal Directed  Orientation:  Negative  Thought Content:  Logical  Suicidal Thoughts:  No  Homicidal Thoughts:  No  Memory:  NA  Judgement:  Fair  Insight:  Lacking  Psychomotor Activity:  NA and Normal  Concentration:  Concentration: Fair  Recall:  Poor  Fund of Knowledge:Poor  Language: Good  Akathisia:  No  Handed:  Right  AIMS (if indicated):    Assets:  Desire for Improvement  ADL's:  Intact  Cognition: WNL  Sleep:     Gypsy Balsam, MD 8/21/20194:11 PM  The patient's first problem is that of anxiety. It turns out that she's been actually out of her Tranxene for about 12 days. Importantly she doesn't feel any different off. At this time we'll take the opportunity to discontinue her Tranxene. The patients anxiety is not and is actually controlled for other reasons. Her second problem is that of clinical depression. At this time she has no symptoms of depression. She takes Cymbalta 40 mg and will continue this medicine. Her third problem is chronic back pain. At this time she takes  hydrocodone and also Cymbalta probably contributes to reducing her pain. The patient denies the use of alcohol or drugs. She follows instructions very well. She is calm and compliant. She's no fatigue at all. She is no evidence of psychosis. Today her son [name] was very helpful and confirms the above information.This patient is in therapy here for the last 2 months. She does very well and seems to connect it. She'll continue in therapy.

## 2018-03-07 ENCOUNTER — Encounter (HOSPITAL_COMMUNITY): Payer: Self-pay | Admitting: Licensed Clinical Social Worker

## 2018-03-07 ENCOUNTER — Ambulatory Visit (INDEPENDENT_AMBULATORY_CARE_PROVIDER_SITE_OTHER): Payer: Medicare Other | Admitting: Licensed Clinical Social Worker

## 2018-03-07 DIAGNOSIS — F330 Major depressive disorder, recurrent, mild: Secondary | ICD-10-CM | POA: Diagnosis not present

## 2018-03-07 NOTE — Progress Notes (Signed)
   THERAPIST PROGRESS NOTE  Session Time: 9:00am-9:45am  Participation Level: Active  Behavioral Response: CasualAlertEuthymic  Type of Therapy: Individual Therapy  Treatment Goals addressed: improve psychiatric symptoms, elevate mood (increased appetite, decreased isolation, increased social interaction, increased self-esteem), improve unhelpful thought patterns, implement interpersonal relationship (increased attentiveness, more honest communication, decreased anger), controlled behavior, moderated mood, deliberate speech, deliberate thought (improved social functioning), learn about diagnosis, healthy coping skills  Interventions: CBT, Motivational Interviewing, psychoeducation  Summary: Theresa Mendoza is a 72 y.o. female who presents with Bipolar I disorder, most recent episode depressed.   Suicidal/Homicidal: No   without intent/plan  Therapist Response: Vaughan Basta met with clinician for an individual therapy session. [name] discussed her psychiatric symptoms and her current life events. [name] shared that she is excited to visit her brother in [place]. She continues to feel uncomfortable in her son's house, but feels stuck and without any options for moving. Clinician explored alternative housing options, as well as level of motivation to make changes. Clinician identified local supports for housing for seniors. [name] denied ability to move due to son needing her income. However, she also reports that she feels low motivation to "rock the boat" due to concerns about son's anger.   Plan: Return again in 1-2 weeks.  Diagnosis: Axis I: Bipolar I disorder, most recent episode depressed    Mindi Curling, LCSW 03/07/2018

## 2018-04-04 ENCOUNTER — Ambulatory Visit (HOSPITAL_COMMUNITY): Payer: Self-pay | Admitting: Licensed Clinical Social Worker

## 2018-04-11 ENCOUNTER — Ambulatory Visit (HOSPITAL_COMMUNITY): Payer: Self-pay | Admitting: Licensed Clinical Social Worker

## 2018-05-24 DIAGNOSIS — Z23 Encounter for immunization: Secondary | ICD-10-CM | POA: Diagnosis not present

## 2018-07-31 ENCOUNTER — Encounter (HOSPITAL_COMMUNITY): Payer: Self-pay | Admitting: Psychiatry

## 2018-07-31 ENCOUNTER — Ambulatory Visit (INDEPENDENT_AMBULATORY_CARE_PROVIDER_SITE_OTHER): Payer: Medicare Other | Admitting: Psychiatry

## 2018-07-31 DIAGNOSIS — Z7901 Long term (current) use of anticoagulants: Secondary | ICD-10-CM | POA: Diagnosis not present

## 2018-07-31 DIAGNOSIS — E785 Hyperlipidemia, unspecified: Secondary | ICD-10-CM | POA: Diagnosis not present

## 2018-07-31 DIAGNOSIS — G8929 Other chronic pain: Secondary | ICD-10-CM | POA: Diagnosis not present

## 2018-07-31 DIAGNOSIS — Z5181 Encounter for therapeutic drug level monitoring: Secondary | ICD-10-CM | POA: Diagnosis not present

## 2018-07-31 DIAGNOSIS — I2510 Atherosclerotic heart disease of native coronary artery without angina pectoris: Secondary | ICD-10-CM | POA: Diagnosis not present

## 2018-07-31 DIAGNOSIS — I4891 Unspecified atrial fibrillation: Secondary | ICD-10-CM | POA: Diagnosis not present

## 2018-07-31 DIAGNOSIS — F3342 Major depressive disorder, recurrent, in full remission: Secondary | ICD-10-CM

## 2018-07-31 MED ORDER — DOXEPIN HCL 50 MG PO CAPS: 50.0000 mg | ORAL_CAPSULE | Freq: Every day | ORAL | 7 refills | Status: DC

## 2018-07-31 MED ORDER — DULOXETINE HCL 40 MG PO CPEP: 60.0000 mg | ORAL_CAPSULE | Freq: Every day | ORAL | 5 refills | Status: DC

## 2018-07-31 NOTE — Progress Notes (Signed)
Psychiatric Initial Adult Assessment   Patient Identification: Theresa Mendoza MRN:  [id number] Date of Evaluation:  07/31/2018 Referral Source: Dr. Allena KatzPatel Chief Complaint: Major depression remission  Today the patient is being seen for history of major depression.  At this time the patient is doing remarkably well.  She lives with her son Theresa Mendoza and his wife and is adjusted to a very well.  She likes to read a great deal.  She likes the 2 cats that live in the house.  Patient has no social life.  She has a mom.  She says she is not really bored not all that long.  Medically she is doing great.  Financially she is stable.  The patient no longer drives.  The patient does have a contact with her brother and 2 other sisters.  At this time the patient denies daily depression.  It should be noted that she has had 2 suicide attempts in the distant past.  She has had 3 psychiatric hospitalization over a decade ago.  At this time she sleeps and eats well.  She is got great energy.  She is come off Tranxene with no problems.  She takes doxepin for sleep but not sure it makes any difference.  She can think and concentrate without problems.  She denies worthlessness.  She denies use of alcohol or drugs.  The patient has degenerative joint disease in her back.  She takes oxycodone which helps about 60%.  The Cymbalta I gave her controlled depression probably contributes to reducing her pain as well.  The patient does have some interested in volunteering at places but her biggest complaint is she has no transportation.  Apparently Theresa Mendoza is hesitant to give her transportation to take her anywhere.  Nonetheless the patient is seen alone today and has no real complaints.  Her back pain is moderately controlled.  She sees Dr. Hyman HopesWebb as her primary care doctor.  She is very stable and functioning well.  Visit Diagnosis:  Major depression recurrent mild   ICD-10-CM   1. Major depression, recurrent, full remission (HCC) F33.42  doxepin (SINEQUAN) 50 MG capsule      Past Psychiatric History: 3 psychiatric hospitalizations 30 years ago. The patient also been on a few antidepressants by her primary care doctor years but she's not specific about which ones they were. She suggested that they were probably Zoloft and Prozac.  Previous Psychotropic Medications: Yes   Substance Abuse History in the last 12 months:  No.  Consequences of Substance Abuse: Negative  Past Medical History:  Past Medical History:  Diagnosis Date  . Anxiety   . Bipolar 1 disorder (HCC)   . Bowel obstruction (HCC)   . Cataract   . Chronic atrial fibrillation 09/2014   On chronic anticoagulation with coumadin.  She has failed several DCCVs in the past.  . COPD (chronic obstructive pulmonary disease) (HCC)   . Dyslipidemia, goal LDL below 70 12/25/2015  . Encounter for tobacco use cessation counseling 12/04/2017  . Glaucoma   . Hx of migraine headaches   . Memory loss   . Mitral regurgitation    mild to moderate on echo 09/2014  . Old MI (myocardial infarction) 09/2014   cath with normal coronary arteries and EF 50-55%  . Osteoarthritis   . Pulmonary HTN (HCC) 09/2014   Mild with PASP 44mmhg at cath  . RBBB   . Rheumatoid arthritis (HCC)   . Tobacco abuse     Past Surgical History:  Procedure  Laterality Date  . ADENOIDECTOMY    . BACK SURGERY    . bowel obstruction    . CARDIAC CATHETERIZATION  09/2014   normal coronary arteries with mild pulmonary HTN, low normal LVF  . CHOLECYSTECTOMY    . NOSE SURGERY    . TONSILLECTOMY    . TRANSESOPHAGEAL ECHOCARDIOGRAM WITH CARDIOVERSION  09/2014  . VAGINAL HYSTERECTOMY     with BSO    Family Psychiatric History:   Family History:  Family History  Problem Relation Age of Onset  . Tuberculosis Father   . Depression Father   . Alzheimer's disease Mother   . Lung cancer Mother     Social History:   Social History   Socioeconomic History  . Marital status: Widowed    Spouse  name: Not on file  . Number of children: Not on file  . Years of education: Not on file  . Highest education level: Not on file  Occupational History  . Not on file  Social Needs  . Financial resource strain: Not on file  . Food insecurity:    Worry: Not on file    Inability: Not on file  . Transportation needs:    Medical: Not on file    Non-medical: Not on file  Tobacco Use  . Smoking status: Current Every Day Smoker    Packs/day: 2.00    Years: 55.00    Pack years: 110.00    Types: Cigarettes  . Smokeless tobacco: Never Used  . Tobacco comment: Has cut back to 1 pk a day from 3 a day.  Substance and Sexual Activity  . Alcohol use: No    Alcohol/week: 0.0 standard drinks  . Drug use: No  . Sexual activity: Never  Lifestyle  . Physical activity:    Days per week: Not on file    Minutes per session: Not on file  . Stress: Not on file  Relationships  . Social connections:    Talks on phone: Not on file    Gets together: Not on file    Attends religious service: Not on file    Active member of club or organization: Not on file    Attends meetings of clubs or organizations: Not on file    Relationship status: Not on file  Other Topics Concern  . Not on file  Social History Narrative   Lives with son and daughter in law in a 2 story home.  Has 1 son.  Retired from [employer].  Education: high school.  Widowed.    Additional Social History:   Allergies:   Allergies  Allergen Reactions  . Lithium     Patient became suicidal  . Tegretol [Carbamazepine]     Metabolic Disorder Labs: No results found for: HGBA1C, MPG No results found for: PROLACTIN No results found for: CHOL, TRIG, HDL, CHOLHDL, VLDL, LDLCALC   Current Medications: Current Outpatient Medications  Medication Sig Dispense Refill  . ADVAIR DISKUS 250-50 MCG/DOSE AEPB TAKE 1 PUFF BY MOUTH TWICE A DAY 60 each 5  . albuterol (PROVENTIL HFA;VENTOLIN HFA) 108 (90 Base) MCG/ACT inhaler Inhale 2 puffs into the  lungs every 6 (six) hours as needed for wheezing or shortness of breath. 1 Inhaler 3  . atenolol (TENORMIN) 25 MG tablet Take 25 mg by mouth daily.  0  . digoxin (LANOXIN) 0.125 MG tablet Take 1 tablet (0.125 mg total) by mouth daily. 90 tablet 3  . diltiazem (CARDIZEM CD) 240 MG 24 hr capsule Take 240  mg by mouth daily.  1  . doxepin (SINEQUAN) 50 MG capsule Take 1 capsule (50 mg total) by mouth at bedtime. 30 capsule 7  . DULoxetine HCl 40 MG CPEP Take 60 mg by mouth daily. 2  qam 60 capsule 5  . furosemide (LASIX) 40 MG tablet Take 40 mg by mouth 2 (two) times daily.   1  . HYDROcodone-acetaminophen (NORCO) 10-325 MG tablet Take 1 tablet by mouth 3 (three) times daily.    Marland Kitchen warfarin (COUMADIN) 5 MG tablet 1/2 tab on MWF. 1 tab on T, Th,Sat, and Sun  3   No current facility-administered medications for this visit.     Neurologic: Headache: No Seizure: No Paresthesias:No  Musculoskeletal: Strength & Muscle Tone: within normal limits Gait & Station: normal Patient leans: N/A  Psychiatric Specialty Exam: ROS  Blood pressure 122/66, pulse 68, height 5' 5.5" (1.664 m), weight 159 lb (72.1 kg), SpO2 90 %.Body mass index is 26.06 kg/m.  General Appearance: Casual  Eye Contact:  Good  Speech:  Clear and Coherent  Volume:  Normal  Mood:  Anxious  Affect:  Congruent  Thought Process:  Disorganized and Goal Directed  Orientation:  Negative  Thought Content:  Logical  Suicidal Thoughts:  No  Homicidal Thoughts:  No  Memory:  NA  Judgement:  Fair  Insight:  Lacking  Psychomotor Activity:  NA and Normal  Concentration:  Concentration: Fair  Recall:  Poor  Fund of Knowledge:Poor  Language: Good  Akathisia:  No  Handed:  Right  AIMS (if indicated):    Assets:  Desire for Improvement  ADL's:  Intact  Cognition: WNL  Sleep:     Gypsy Balsam, MD 1/15/20204:03 PM  At this time the patient has 1 problem.  This is major clinical depression which is well controlled taking  Cymbalta.  We will clarify her dose of Cymbalta that it should be 60 mg 2 in the morning.  This also benefits for her pain as well.  She has no vegetative symptoms at this time.  She is having no side effects from her Cymbalta.  There is no reason that she should just stay on Cymbalta indefinitely.  Given that she is made suicide attempts and she has been hospitalized multiple times I would consider chronic treatment.  This patient she will return to see me in 5 months for just a 15-minute visit.  We made herself available to speak to her son Theresa Cower if he would like to the patient will allow it.  The patient is very stable at this time.

## 2018-09-10 DIAGNOSIS — Z7901 Long term (current) use of anticoagulants: Secondary | ICD-10-CM | POA: Diagnosis not present

## 2018-09-23 DIAGNOSIS — Z7901 Long term (current) use of anticoagulants: Secondary | ICD-10-CM | POA: Diagnosis not present

## 2018-09-24 ENCOUNTER — Other Ambulatory Visit (HOSPITAL_COMMUNITY): Payer: Self-pay

## 2018-09-24 MED ORDER — DULOXETINE HCL 60 MG PO CPEP: ORAL_CAPSULE | ORAL | 3 refills | Status: DC

## 2018-09-25 DIAGNOSIS — R399 Unspecified symptoms and signs involving the genitourinary system: Secondary | ICD-10-CM | POA: Diagnosis not present

## 2018-09-25 DIAGNOSIS — K2950 Unspecified chronic gastritis without bleeding: Secondary | ICD-10-CM | POA: Diagnosis not present

## 2018-10-02 DIAGNOSIS — Z7901 Long term (current) use of anticoagulants: Secondary | ICD-10-CM | POA: Diagnosis not present

## 2018-10-17 ENCOUNTER — Other Ambulatory Visit (HOSPITAL_COMMUNITY): Payer: Self-pay | Admitting: Psychiatry

## 2018-10-18 NOTE — Telephone Encounter (Signed)
ok 

## 2018-12-03 DIAGNOSIS — R399 Unspecified symptoms and signs involving the genitourinary system: Secondary | ICD-10-CM | POA: Diagnosis not present

## 2018-12-03 DIAGNOSIS — E782 Mixed hyperlipidemia: Secondary | ICD-10-CM | POA: Diagnosis not present

## 2018-12-03 DIAGNOSIS — I2510 Atherosclerotic heart disease of native coronary artery without angina pectoris: Secondary | ICD-10-CM | POA: Diagnosis not present

## 2018-12-03 DIAGNOSIS — Z0000 Encounter for general adult medical examination without abnormal findings: Secondary | ICD-10-CM | POA: Diagnosis not present

## 2018-12-03 DIAGNOSIS — Z1211 Encounter for screening for malignant neoplasm of colon: Secondary | ICD-10-CM | POA: Diagnosis not present

## 2018-12-03 DIAGNOSIS — I4891 Unspecified atrial fibrillation: Secondary | ICD-10-CM | POA: Diagnosis not present

## 2018-12-03 DIAGNOSIS — R739 Hyperglycemia, unspecified: Secondary | ICD-10-CM | POA: Diagnosis not present

## 2018-12-03 DIAGNOSIS — Z7901 Long term (current) use of anticoagulants: Secondary | ICD-10-CM | POA: Diagnosis not present

## 2018-12-03 DIAGNOSIS — Z5181 Encounter for therapeutic drug level monitoring: Secondary | ICD-10-CM | POA: Diagnosis not present

## 2018-12-12 ENCOUNTER — Other Ambulatory Visit: Payer: Self-pay | Admitting: Cardiology

## 2018-12-23 DIAGNOSIS — R739 Hyperglycemia, unspecified: Secondary | ICD-10-CM | POA: Diagnosis not present

## 2018-12-23 DIAGNOSIS — Z1211 Encounter for screening for malignant neoplasm of colon: Secondary | ICD-10-CM | POA: Diagnosis not present

## 2018-12-23 DIAGNOSIS — Z5181 Encounter for therapeutic drug level monitoring: Secondary | ICD-10-CM | POA: Diagnosis not present

## 2018-12-23 DIAGNOSIS — Z7901 Long term (current) use of anticoagulants: Secondary | ICD-10-CM | POA: Diagnosis not present

## 2018-12-23 DIAGNOSIS — R399 Unspecified symptoms and signs involving the genitourinary system: Secondary | ICD-10-CM | POA: Diagnosis not present

## 2018-12-23 DIAGNOSIS — E782 Mixed hyperlipidemia: Secondary | ICD-10-CM | POA: Diagnosis not present

## 2019-01-01 ENCOUNTER — Other Ambulatory Visit: Payer: Self-pay

## 2019-01-01 ENCOUNTER — Ambulatory Visit (INDEPENDENT_AMBULATORY_CARE_PROVIDER_SITE_OTHER): Payer: Medicare Other | Admitting: Psychiatry

## 2019-01-01 DIAGNOSIS — F325 Major depressive disorder, single episode, in full remission: Secondary | ICD-10-CM | POA: Diagnosis not present

## 2019-01-01 MED ORDER — DULOXETINE HCL 60 MG PO CPEP: ORAL_CAPSULE | ORAL | 2 refills | Status: DC

## 2019-01-01 NOTE — Progress Notes (Signed)
Psychiatric Initial Adult Assessment   Patient Identification: Theresa Mendoza MRN:  [id number] Date of Evaluation:  01/01/2019 Referral Source: Dr. Allena Katz Chief Complaint: Major depression remission  This patient is seen in this clinic for major depression.  Her mood is actually fairly stable.  She lives with her adult children Theresa Mendoza and his wife.  She has 2 cats.  She loves to read.  She watches TV.  She is reasonably active.  She has some chronic back pain that she takes an opiate for.  She believes the Cymbalta is helpful.  Patient denies any psychotic symptoms.  She is a good sense of worth of Valium.  She denies use of alcohol or drugs.  She has a distant history of suicide attempts and psychiatric hospitalizations.  They are very much in her past.  She is positive and optimistic.  She is excepted what she has for now.  She is generally happy with life.  Visit Diagnosis:  Major depression recurrent mild No diagnosis found.    Past Psychiatric History: 3 psychiatric hospitalizations 30 years ago. The patient also been on a few antidepressants by her primary care doctor years but she's not specific about which ones they were. She suggested that they were probably Zoloft and Prozac.  Previous Psychotropic Medications: Yes   Substance Abuse History in the last 12 months:  No.  Consequences of Substance Abuse: Negative  Past Medical History:  Past Medical History:  Diagnosis Date  . Anxiety   . Bipolar 1 disorder (HCC)   . Bowel obstruction (HCC)   . Cataract   . Chronic atrial fibrillation 09/2014   On chronic anticoagulation with coumadin.  She has failed several DCCVs in the past.  . COPD (chronic obstructive pulmonary disease) (HCC)   . Dyslipidemia, goal LDL below 70 12/25/2015  . Encounter for tobacco use cessation counseling 12/04/2017  . Glaucoma   . Hx of migraine headaches   . Memory loss   . Mitral regurgitation    mild to moderate on echo 09/2014  . Old MI (myocardial  infarction) 09/2014   cath with normal coronary arteries and EF 50-55%  . Osteoarthritis   . Pulmonary HTN (HCC) 09/2014   Mild with PASP at cath  . RBBB   . Rheumatoid arthritis (HCC)   . Tobacco abuse     Past Surgical History:  Procedure Laterality Date  . ADENOIDECTOMY    . BACK SURGERY    . bowel obstruction    . CARDIAC CATHETERIZATION  09/2014   normal coronary arteries with mild pulmonary HTN, low normal LVF  . CHOLECYSTECTOMY    . NOSE SURGERY    . TONSILLECTOMY    . TRANSESOPHAGEAL ECHOCARDIOGRAM WITH CARDIOVERSION  09/2014  . VAGINAL HYSTERECTOMY     with BSO    Family Psychiatric History:   Family History:  Family History  Problem Relation Age of Onset  . Tuberculosis Father   . Depression Father   . Alzheimer's disease Mother   . Lung cancer Mother     Social History:   Social History   Socioeconomic History  . Marital status: Widowed    Spouse name: Not on file  . Number of children: Not on file  . Years of education: Not on file  . Highest education level: Not on file  Occupational History  . Not on file  Social Needs  . Financial resource strain: Not on file  . Food insecurity    Worry: Not on file  Inability: Not on file  . Transportation needs    Medical: Not on file    Non-medical: Not on file  Tobacco Use  . Smoking status: Current Every Day Smoker    Packs/day: 2.00    Years: 55.00    Pack years: 110.00    Types: Cigarettes  . Smokeless tobacco: Never Used  . Tobacco comment: Has cut back to 1 pk a day from 3 a day.  Substance and Sexual Activity  . Alcohol use: No    Alcohol/week: 0.0 standard drinks  . Drug use: No  . Sexual activity: Never  Lifestyle  . Physical activity    Days per week: Not on file    Minutes per session: Not on file  . Stress: Not on file  Relationships  . Social Herbalist on phone: Not on file    Gets together: Not on file    Attends religious service: Not on file    Active member  of club or organization: Not on file    Attends meetings of clubs or organizations: Not on file    Relationship status: Not on file  Other Topics Concern  . Not on file  Social History Narrative   Lives with son and daughter in law in a 2 story home.  Has 1 son.  Retired from [employer].  Education: high school.  Widowed.    Additional Social History:   Allergies:   Allergies  Allergen Reactions  . Lithium     Patient became suicidal  . Tegretol [Carbamazepine]     Metabolic Disorder Labs: No results found for: HGBA1C, MPG No results found for: PROLACTIN No results found for: CHOL, TRIG, HDL, CHOLHDL, VLDL, LDLCALC   Current Medications: Current Outpatient Medications  Medication Sig Dispense Refill  . ADVAIR DISKUS 250-50 MCG/DOSE AEPB TAKE 1 PUFF BY MOUTH TWICE A DAY 60 each 5  . albuterol (PROVENTIL HFA;VENTOLIN HFA) 108 (90 Base) MCG/ACT inhaler Inhale 2 puffs into the lungs every 6 (six) hours as needed for wheezing or shortness of breath. 1 Inhaler 3  . atenolol (TENORMIN) 25 MG tablet Take 25 mg by mouth daily.  0  . digoxin (LANOXIN) 0.125 MG tablet TAKE 1 TABLET (0.125 MG TOTAL) BY MOUTH DAILY. 90 tablet 2  . diltiazem (CARDIZEM CD) 240 MG 24 hr capsule Take 240 mg by mouth daily.  1  . doxepin (SINEQUAN) 50 MG capsule Take 1 capsule (50 mg total) by mouth at bedtime. 30 capsule 7  . DULoxetine (CYMBALTA) 60 MG capsule TAKE 2 CAPSULES BY MOUTH EVERY DAY 180 capsule 2  . furosemide (LASIX) 40 MG tablet Take 40 mg by mouth 2 (two) times daily.   1  . HYDROcodone-acetaminophen (NORCO) 10-325 MG tablet Take 1 tablet by mouth 3 (three) times daily.    Marland Kitchen warfarin (COUMADIN) 5 MG tablet 1/2 tab on MWF. 1 tab on T, Th,Sat, and Sun  3   No current facility-administered medications for this visit.     Neurologic: Headache: No Seizure: No Paresthesias:No  Musculoskeletal: Strength & Muscle Tone: within normal limits Gait & Station: normal Patient leans: N/A  Psychiatric  Specialty Exam: ROS  There were no vitals taken for this visit.There is no height or weight on file to calculate BMI.  General Appearance: Casual  Eye Contact:  Good  Speech:  Clear and Coherent  Volume:  Normal  Mood:  Anxious  Affect:  Congruent  Thought Process:  Disorganized and Goal Directed  Orientation:  Negative  Thought Content:  Logical  Suicidal Thoughts:  No  Homicidal Thoughts:  No  Memory:  NA  Judgement:  Fair  Insight:  Lacking  Psychomotor Activity:  NA and Normal  Concentration:  Concentration: Fair  Recall:  Poor  Fund of Knowledge:Poor  Language: Good  Akathisia:  No  Handed:  Right  AIMS (if indicated):    Assets:  Desire for Improvement  ADL's:  Intact  Cognition: WNL  Sleep:     Gypsy Balsam, MD 6/17/20203:36 PM  This patient has 1 problem and that is major depression in remission.  She also has chronic pain.  I am treating her for her depression with Cymbalta 120 mg.  Patient is not in therapy.  She is functioning very well.  She is not fatigued.  She is under the care of her primary care doctor is usually relatively regularly and treats her for her back pain.  This patient will be seen again in 6 months.

## 2019-01-02 ENCOUNTER — Telehealth (HOSPITAL_COMMUNITY): Payer: Self-pay | Admitting: Psychiatry

## 2019-01-06 DIAGNOSIS — N3000 Acute cystitis without hematuria: Secondary | ICD-10-CM | POA: Diagnosis not present

## 2019-06-02 ENCOUNTER — Other Ambulatory Visit (HOSPITAL_COMMUNITY): Payer: Self-pay | Admitting: Psychiatry

## 2019-06-02 DIAGNOSIS — F3342 Major depressive disorder, recurrent, in full remission: Secondary | ICD-10-CM

## 2019-07-03 ENCOUNTER — Ambulatory Visit (HOSPITAL_COMMUNITY): Payer: Medicare Other | Admitting: Psychiatry

## 2019-07-08 ENCOUNTER — Ambulatory Visit (HOSPITAL_COMMUNITY): Payer: Medicare Other | Admitting: Psychiatry

## 2019-07-15 ENCOUNTER — Ambulatory Visit (HOSPITAL_COMMUNITY): Payer: Medicare Other | Admitting: Psychiatry

## 2019-07-15 ENCOUNTER — Other Ambulatory Visit: Payer: Self-pay

## 2019-08-08 ENCOUNTER — Other Ambulatory Visit (HOSPITAL_COMMUNITY): Payer: Self-pay | Admitting: Psychiatry

## 2019-08-08 DIAGNOSIS — F3342 Major depressive disorder, recurrent, in full remission: Secondary | ICD-10-CM

## 2019-08-09 DIAGNOSIS — G4734 Idiopathic sleep related nonobstructive alveolar hypoventilation: Secondary | ICD-10-CM | POA: Diagnosis not present

## 2019-08-09 DIAGNOSIS — I4891 Unspecified atrial fibrillation: Secondary | ICD-10-CM | POA: Diagnosis not present

## 2019-08-09 DIAGNOSIS — F0390 Unspecified dementia without behavioral disturbance: Secondary | ICD-10-CM | POA: Diagnosis not present

## 2019-08-09 DIAGNOSIS — I2510 Atherosclerotic heart disease of native coronary artery without angina pectoris: Secondary | ICD-10-CM | POA: Diagnosis not present

## 2019-08-09 DIAGNOSIS — J441 Chronic obstructive pulmonary disease with (acute) exacerbation: Secondary | ICD-10-CM | POA: Diagnosis not present

## 2019-08-09 DIAGNOSIS — Z7189 Other specified counseling: Secondary | ICD-10-CM | POA: Diagnosis not present

## 2019-09-29 ENCOUNTER — Other Ambulatory Visit (HOSPITAL_COMMUNITY): Payer: Self-pay | Admitting: Psychiatry

## 2019-09-29 ENCOUNTER — Other Ambulatory Visit: Payer: Self-pay | Admitting: Cardiology

## 2019-09-30 ENCOUNTER — Other Ambulatory Visit: Payer: Self-pay | Admitting: Cardiology

## 2019-09-30 MED ORDER — DIGOXIN 125 MCG PO TABS: 0.1250 mg | ORAL_TABLET | Freq: Every day | ORAL | 0 refills | Status: DC

## 2019-10-05 ENCOUNTER — Other Ambulatory Visit (HOSPITAL_COMMUNITY): Payer: Self-pay | Admitting: Psychiatry

## 2019-10-14 DIAGNOSIS — S99919A Unspecified injury of unspecified ankle, initial encounter: Secondary | ICD-10-CM | POA: Diagnosis not present

## 2019-10-20 DIAGNOSIS — M1711 Unilateral primary osteoarthritis, right knee: Secondary | ICD-10-CM | POA: Diagnosis not present

## 2019-12-04 DIAGNOSIS — Z0000 Encounter for general adult medical examination without abnormal findings: Secondary | ICD-10-CM | POA: Diagnosis not present

## 2019-12-04 DIAGNOSIS — I4891 Unspecified atrial fibrillation: Secondary | ICD-10-CM | POA: Diagnosis not present

## 2019-12-04 DIAGNOSIS — Z1211 Encounter for screening for malignant neoplasm of colon: Secondary | ICD-10-CM | POA: Diagnosis not present

## 2019-12-04 DIAGNOSIS — F3132 Bipolar disorder, current episode depressed, moderate: Secondary | ICD-10-CM | POA: Diagnosis not present

## 2019-12-04 DIAGNOSIS — Z5181 Encounter for therapeutic drug level monitoring: Secondary | ICD-10-CM | POA: Diagnosis not present

## 2019-12-04 DIAGNOSIS — R739 Hyperglycemia, unspecified: Secondary | ICD-10-CM | POA: Diagnosis not present

## 2019-12-04 DIAGNOSIS — E782 Mixed hyperlipidemia: Secondary | ICD-10-CM | POA: Diagnosis not present

## 2019-12-04 DIAGNOSIS — Z7901 Long term (current) use of anticoagulants: Secondary | ICD-10-CM | POA: Diagnosis not present

## 2019-12-04 DIAGNOSIS — E119 Type 2 diabetes mellitus without complications: Secondary | ICD-10-CM | POA: Diagnosis not present

## 2019-12-04 DIAGNOSIS — I2510 Atherosclerotic heart disease of native coronary artery without angina pectoris: Secondary | ICD-10-CM | POA: Diagnosis not present

## 2020-03-09 ENCOUNTER — Other Ambulatory Visit: Payer: Self-pay | Admitting: Cardiology

## 2020-03-24 DIAGNOSIS — Z20828 Contact with and (suspected) exposure to other viral communicable diseases: Secondary | ICD-10-CM | POA: Diagnosis not present

## 2020-03-24 DIAGNOSIS — J441 Chronic obstructive pulmonary disease with (acute) exacerbation: Secondary | ICD-10-CM | POA: Diagnosis not present

## 2020-03-24 DIAGNOSIS — J019 Acute sinusitis, unspecified: Secondary | ICD-10-CM | POA: Diagnosis not present

## 2020-07-20 DIAGNOSIS — Z23 Encounter for immunization: Secondary | ICD-10-CM | POA: Diagnosis not present

## 2020-07-21 ENCOUNTER — Other Ambulatory Visit (HOSPITAL_COMMUNITY): Payer: Self-pay | Admitting: Psychiatry

## 2020-08-03 DIAGNOSIS — J441 Chronic obstructive pulmonary disease with (acute) exacerbation: Secondary | ICD-10-CM | POA: Diagnosis not present

## 2020-09-22 ENCOUNTER — Encounter (HOSPITAL_COMMUNITY): Payer: Self-pay | Admitting: Psychiatry

## 2020-09-23 ENCOUNTER — Ambulatory Visit: Payer: Medicare Other | Admitting: Podiatry

## 2020-10-07 ENCOUNTER — Emergency Department (HOSPITAL_COMMUNITY): Payer: Medicare Other

## 2020-10-07 ENCOUNTER — Inpatient Hospital Stay (HOSPITAL_COMMUNITY)
Admission: EM | Admit: 2020-10-07 | Discharge: 2020-10-13 | DRG: 291 | Disposition: A | Discharge status: Home Health | Payer: Medicare Other | Source: Ambulatory Visit | Attending: Internal Medicine | Admitting: Internal Medicine

## 2020-10-07 ENCOUNTER — Other Ambulatory Visit: Payer: Self-pay

## 2020-10-07 ENCOUNTER — Telehealth: Payer: Self-pay | Admitting: Cardiology

## 2020-10-07 ENCOUNTER — Encounter (HOSPITAL_COMMUNITY): Payer: Self-pay | Admitting: Emergency Medicine

## 2020-10-07 DIAGNOSIS — I5043 Acute on chronic combined systolic (congestive) and diastolic (congestive) heart failure: Secondary | ICD-10-CM | POA: Diagnosis present

## 2020-10-07 DIAGNOSIS — M069 Rheumatoid arthritis, unspecified: Secondary | ICD-10-CM | POA: Diagnosis present

## 2020-10-07 DIAGNOSIS — I4891 Unspecified atrial fibrillation: Secondary | ICD-10-CM | POA: Diagnosis not present

## 2020-10-07 DIAGNOSIS — Z801 Family history of malignant neoplasm of trachea, bronchus and lung: Secondary | ICD-10-CM | POA: Diagnosis not present

## 2020-10-07 DIAGNOSIS — G894 Chronic pain syndrome: Secondary | ICD-10-CM | POA: Diagnosis present

## 2020-10-07 DIAGNOSIS — I429 Cardiomyopathy, unspecified: Secondary | ICD-10-CM | POA: Diagnosis present

## 2020-10-07 DIAGNOSIS — J440 Chronic obstructive pulmonary disease with acute lower respiratory infection: Secondary | ICD-10-CM | POA: Diagnosis present

## 2020-10-07 DIAGNOSIS — I509 Heart failure, unspecified: Secondary | ICD-10-CM

## 2020-10-07 DIAGNOSIS — I420 Dilated cardiomyopathy: Secondary | ICD-10-CM | POA: Diagnosis not present

## 2020-10-07 DIAGNOSIS — I350 Nonrheumatic aortic (valve) stenosis: Secondary | ICD-10-CM | POA: Diagnosis not present

## 2020-10-07 DIAGNOSIS — Z23 Encounter for immunization: Secondary | ICD-10-CM

## 2020-10-07 DIAGNOSIS — J441 Chronic obstructive pulmonary disease with (acute) exacerbation: Secondary | ICD-10-CM | POA: Diagnosis present

## 2020-10-07 DIAGNOSIS — J9601 Acute respiratory failure with hypoxia: Secondary | ICD-10-CM | POA: Insufficient documentation

## 2020-10-07 DIAGNOSIS — I10 Essential (primary) hypertension: Secondary | ICD-10-CM

## 2020-10-07 DIAGNOSIS — E785 Hyperlipidemia, unspecified: Secondary | ICD-10-CM | POA: Diagnosis present

## 2020-10-07 DIAGNOSIS — I352 Nonrheumatic aortic (valve) stenosis with insufficiency: Secondary | ICD-10-CM | POA: Diagnosis present

## 2020-10-07 DIAGNOSIS — I452 Bifascicular block: Secondary | ICD-10-CM | POA: Diagnosis present

## 2020-10-07 DIAGNOSIS — I5041 Acute combined systolic (congestive) and diastolic (congestive) heart failure: Secondary | ICD-10-CM | POA: Diagnosis not present

## 2020-10-07 DIAGNOSIS — I272 Pulmonary hypertension, unspecified: Secondary | ICD-10-CM | POA: Diagnosis present

## 2020-10-07 DIAGNOSIS — F17210 Nicotine dependence, cigarettes, uncomplicated: Secondary | ICD-10-CM | POA: Diagnosis present

## 2020-10-07 DIAGNOSIS — I5023 Acute on chronic systolic (congestive) heart failure: Secondary | ICD-10-CM | POA: Diagnosis not present

## 2020-10-07 DIAGNOSIS — R791 Abnormal coagulation profile: Secondary | ICD-10-CM | POA: Diagnosis present

## 2020-10-07 DIAGNOSIS — J189 Pneumonia, unspecified organism: Secondary | ICD-10-CM | POA: Diagnosis present

## 2020-10-07 DIAGNOSIS — I5032 Chronic diastolic (congestive) heart failure: Secondary | ICD-10-CM

## 2020-10-07 DIAGNOSIS — I110 Hypertensive heart disease with heart failure: Secondary | ICD-10-CM | POA: Diagnosis not present

## 2020-10-07 DIAGNOSIS — M7989 Other specified soft tissue disorders: Secondary | ICD-10-CM | POA: Diagnosis not present

## 2020-10-07 DIAGNOSIS — J9621 Acute and chronic respiratory failure with hypoxia: Secondary | ICD-10-CM | POA: Diagnosis present

## 2020-10-07 DIAGNOSIS — I482 Chronic atrial fibrillation, unspecified: Secondary | ICD-10-CM | POA: Diagnosis not present

## 2020-10-07 DIAGNOSIS — F331 Major depressive disorder, recurrent, moderate: Secondary | ICD-10-CM | POA: Diagnosis present

## 2020-10-07 DIAGNOSIS — Z9981 Dependence on supplemental oxygen: Secondary | ICD-10-CM

## 2020-10-07 DIAGNOSIS — Z820 Family history of epilepsy and other diseases of the nervous system: Secondary | ICD-10-CM | POA: Diagnosis not present

## 2020-10-07 DIAGNOSIS — I5021 Acute systolic (congestive) heart failure: Secondary | ICD-10-CM | POA: Diagnosis not present

## 2020-10-07 DIAGNOSIS — Z20822 Contact with and (suspected) exposure to covid-19: Secondary | ICD-10-CM | POA: Diagnosis present

## 2020-10-07 DIAGNOSIS — J90 Pleural effusion, not elsewhere classified: Secondary | ICD-10-CM | POA: Diagnosis not present

## 2020-10-07 DIAGNOSIS — Z7901 Long term (current) use of anticoagulants: Secondary | ICD-10-CM

## 2020-10-07 DIAGNOSIS — R0602 Shortness of breath: Secondary | ICD-10-CM | POA: Diagnosis not present

## 2020-10-07 DIAGNOSIS — I4821 Permanent atrial fibrillation: Secondary | ICD-10-CM | POA: Diagnosis present

## 2020-10-07 DIAGNOSIS — I5033 Acute on chronic diastolic (congestive) heart failure: Secondary | ICD-10-CM | POA: Diagnosis not present

## 2020-10-07 DIAGNOSIS — I252 Old myocardial infarction: Secondary | ICD-10-CM | POA: Diagnosis not present

## 2020-10-07 DIAGNOSIS — Z79899 Other long term (current) drug therapy: Secondary | ICD-10-CM | POA: Diagnosis not present

## 2020-10-07 DIAGNOSIS — J9620 Acute and chronic respiratory failure, unspecified whether with hypoxia or hypercapnia: Secondary | ICD-10-CM | POA: Diagnosis not present

## 2020-10-07 LAB — HEPATIC FUNCTION PANEL
ALT: 21 U/L (ref 0–44)
AST: 17 U/L (ref 15–41)
Albumin: 3.6 g/dL (ref 3.5–5.0)
Alkaline Phosphatase: 65 U/L (ref 38–126)
Bilirubin, Direct: 0.2 mg/dL (ref 0.0–0.2)
Indirect Bilirubin: 0.5 mg/dL (ref 0.3–0.9)
Total Bilirubin: 0.7 mg/dL (ref 0.3–1.2)
Total Protein: 6.5 g/dL (ref 6.5–8.1)

## 2020-10-07 LAB — CBC
HCT: 44.3 % (ref 36.0–46.0)
Hemoglobin: 14 g/dL (ref 12.0–15.0)
MCH: 28.8 pg (ref 26.0–34.0)
MCHC: 31.6 g/dL (ref 30.0–36.0)
MCV: 91.2 fL (ref 80.0–100.0)
Platelets: 421 10*3/uL — ABNORMAL HIGH (ref 150–400)
RBC: 4.86 MIL/uL (ref 3.87–5.11)
RDW: 14.8 % (ref 11.5–15.5)
WBC: 13.1 10*3/uL — ABNORMAL HIGH (ref 4.0–10.5)
nRBC: 0 % (ref 0.0–0.2)

## 2020-10-07 LAB — RESP PANEL BY RT-PCR (FLU A&B, COVID) ARPGX2
Influenza A by PCR: NEGATIVE
Influenza B by PCR: NEGATIVE
SARS Coronavirus 2 by RT PCR: NEGATIVE

## 2020-10-07 LAB — BASIC METABOLIC PANEL
Anion gap: 10 (ref 5–15)
BUN: 29 mg/dL — ABNORMAL HIGH (ref 8–23)
CO2: 31 mmol/L (ref 22–32)
Calcium: 9.3 mg/dL (ref 8.9–10.3)
Chloride: 97 mmol/L — ABNORMAL LOW (ref 98–111)
Creatinine, Ser: 1 mg/dL (ref 0.44–1.00)
GFR, Estimated: 59 mL/min — ABNORMAL LOW (ref >60)
Glucose, Bld: 110 mg/dL — ABNORMAL HIGH (ref 70–99)
Potassium: 4.2 mmol/L (ref 3.5–5.1)
Sodium: 138 mmol/L (ref 135–145)

## 2020-10-07 LAB — LACTIC ACID, PLASMA
Lactic Acid, Venous: 1.2 mmol/L (ref 0.5–1.9)
Lactic Acid, Venous: 1.2 mmol/L (ref 0.5–1.9)

## 2020-10-07 LAB — PROTIME-INR
INR: 1.8 — ABNORMAL HIGH (ref 0.8–1.2)
Prothrombin Time: 20.4 seconds — ABNORMAL HIGH (ref 11.4–15.2)

## 2020-10-07 LAB — DIGOXIN LEVEL: Digoxin Level: 0.8 ng/mL (ref 0.8–2.0)

## 2020-10-07 LAB — TROPONIN I (HIGH SENSITIVITY)
Troponin I (High Sensitivity): 28 ng/L — ABNORMAL HIGH (ref <18)
Troponin I (High Sensitivity): 26 ng/L — ABNORMAL HIGH (ref <18)

## 2020-10-07 LAB — MAGNESIUM: Magnesium: 2 mg/dL (ref 1.7–2.4)

## 2020-10-07 LAB — BRAIN NATRIURETIC PEPTIDE: B Natriuretic Peptide: 702.4 pg/mL — ABNORMAL HIGH (ref 0.0–100.0)

## 2020-10-07 LAB — PROCALCITONIN: Procalcitonin: <0.1 ng/mL

## 2020-10-07 LAB — TSH: TSH: 1.501 u[IU]/mL (ref 0.350–4.500)

## 2020-10-07 MED ORDER — WARFARIN SODIUM 5 MG PO TABS
5.0000 mg | ORAL_TABLET | Freq: Once | ORAL | Status: DC
  Filled 2020-10-07: qty 1

## 2020-10-07 MED ORDER — ATENOLOL 25 MG PO TABS: 25.0000 mg | ORAL_TABLET | Freq: Every day | ORAL | Status: DC

## 2020-10-07 MED ORDER — SODIUM CHLORIDE 0.9% FLUSH
3.0000 mL | Freq: Two times a day (BID) | INTRAVENOUS | Status: DC
  Administered 2020-10-07 – 2020-10-13 (×12): 3 mL via INTRAVENOUS

## 2020-10-07 MED ORDER — ONDANSETRON HCL 4 MG/2ML IJ SOLN
4.0000 mg | Freq: Four times a day (QID) | INTRAMUSCULAR | Status: DC | PRN
  Administered 2020-10-07 – 2020-10-10 (×5): 4 mg via INTRAVENOUS
  Filled 2020-10-07 (×5): qty 2

## 2020-10-07 MED ORDER — PREDNISONE 20 MG PO TABS
40.0000 mg | ORAL_TABLET | Freq: Every day | ORAL | Status: DC
  Filled 2020-10-07: qty 2

## 2020-10-07 MED ORDER — ATENOLOL 50 MG PO TABS
50.0000 mg | ORAL_TABLET | Freq: Every day | ORAL | Status: DC
  Filled 2020-10-07: qty 1

## 2020-10-07 MED ORDER — WARFARIN - PHARMACIST DOSING INPATIENT: Freq: Every day | Status: DC

## 2020-10-07 MED ORDER — NICOTINE 21 MG/24HR TD PT24
21.0000 mg | MEDICATED_PATCH | Freq: Every day | TRANSDERMAL | Status: DC
  Administered 2020-10-07 – 2020-10-13 (×7): 21 mg via TRANSDERMAL
  Filled 2020-10-07 (×7): qty 1

## 2020-10-07 MED ORDER — PANTOPRAZOLE SODIUM 40 MG PO TBEC
40.0000 mg | DELAYED_RELEASE_TABLET | Freq: Every day | ORAL | Status: DC
  Administered 2020-10-07 – 2020-10-13 (×7): 40 mg via ORAL
  Filled 2020-10-07 (×7): qty 1

## 2020-10-07 MED ORDER — DOXYCYCLINE HYCLATE 100 MG PO TABS
100.0000 mg | ORAL_TABLET | Freq: Two times a day (BID) | ORAL | Status: DC
  Administered 2020-10-07 – 2020-10-12 (×10): 100 mg via ORAL
  Filled 2020-10-07 (×11): qty 1

## 2020-10-07 MED ORDER — SODIUM CHLORIDE 0.9 % IV SOLN: 250.0000 mL | INTRAVENOUS | Status: DC | PRN

## 2020-10-07 MED ORDER — DIGOXIN 125 MCG PO TABS: 0.1250 mg | ORAL_TABLET | Freq: Every day | ORAL | 0 refills | Status: DC

## 2020-10-07 MED ORDER — FUROSEMIDE 10 MG/ML IJ SOLN
60.0000 mg | Freq: Once | INTRAMUSCULAR | Status: AC
  Administered 2020-10-07: 60 mg via INTRAVENOUS
  Filled 2020-10-07: qty 6

## 2020-10-07 MED ORDER — IPRATROPIUM BROMIDE HFA 17 MCG/ACT IN AERS
2.0000 | INHALATION_SPRAY | Freq: Four times a day (QID) | RESPIRATORY_TRACT | Status: DC | PRN
  Filled 2020-10-07: qty 12.9

## 2020-10-07 MED ORDER — DULOXETINE HCL 60 MG PO CPEP
120.0000 mg | ORAL_CAPSULE | Freq: Every day | ORAL | Status: DC
  Administered 2020-10-08 – 2020-10-13 (×6): 120 mg via ORAL
  Filled 2020-10-07 (×7): qty 2

## 2020-10-07 MED ORDER — METOPROLOL TARTRATE 5 MG/5ML IV SOLN
2.5000 mg | Freq: Four times a day (QID) | INTRAVENOUS | Status: DC | PRN
  Administered 2020-10-07: 2.5 mg via INTRAVENOUS
  Filled 2020-10-07: qty 5

## 2020-10-07 MED ORDER — FUROSEMIDE 10 MG/ML IJ SOLN
40.0000 mg | Freq: Two times a day (BID) | INTRAMUSCULAR | Status: DC
  Administered 2020-10-08 – 2020-10-10 (×5): 40 mg via INTRAVENOUS
  Filled 2020-10-07 (×5): qty 4

## 2020-10-07 MED ORDER — IPRATROPIUM BROMIDE 0.02 % IN SOLN
0.5000 mg | Freq: Four times a day (QID) | RESPIRATORY_TRACT | Status: DC
  Administered 2020-10-07 (×2): 0.5 mg via RESPIRATORY_TRACT
  Filled 2020-10-07 (×2): qty 2.5

## 2020-10-07 MED ORDER — ZOLPIDEM TARTRATE 5 MG PO TABS
5.0000 mg | ORAL_TABLET | Freq: Every evening | ORAL | Status: DC | PRN
  Administered 2020-10-07 – 2020-10-08 (×2): 5 mg via ORAL
  Filled 2020-10-07 (×3): qty 1

## 2020-10-07 MED ORDER — DIGOXIN 0.25 MG/ML IJ SOLN
0.2500 mg | Freq: Four times a day (QID) | INTRAMUSCULAR | Status: AC
  Administered 2020-10-07 – 2020-10-08 (×4): 0.25 mg via INTRAVENOUS
  Filled 2020-10-07 (×4): qty 2

## 2020-10-07 MED ORDER — PRAVASTATIN SODIUM 10 MG PO TABS
20.0000 mg | ORAL_TABLET | Freq: Every day | ORAL | Status: DC
Start: 2020-10-07 — End: 2020-10-13
  Administered 2020-10-08 – 2020-10-13 (×6): 20 mg via ORAL
  Filled 2020-10-07 (×6): qty 2

## 2020-10-07 MED ORDER — ACETAMINOPHEN 325 MG PO TABS
650.0000 mg | ORAL_TABLET | ORAL | Status: DC | PRN
  Administered 2020-10-08 – 2020-10-12 (×6): 650 mg via ORAL
  Filled 2020-10-07 (×6): qty 2

## 2020-10-07 MED ORDER — SODIUM CHLORIDE 0.9% FLUSH: 3.0000 mL | INTRAVENOUS | Status: DC | PRN

## 2020-10-07 MED ORDER — FLUTICASONE FUROATE-VILANTEROL 200-25 MCG/INH IN AEPB
1.0000 | INHALATION_SPRAY | Freq: Every day | RESPIRATORY_TRACT | Status: DC
  Administered 2020-10-08 – 2020-10-13 (×6): 1 via RESPIRATORY_TRACT
  Filled 2020-10-07 (×2): qty 28

## 2020-10-07 NOTE — Progress Notes (Signed)
ANTICOAGULATION CONSULT NOTE - Initial Consult  Pharmacy Consult for warfarin Indication: atrial fibrillation  Allergies  Allergen Reactions  . Lithium     Patient became suicidal  . Tegretol [Carbamazepine]     UNK reaction    Patient Measurements:    Vital Signs: Temp: 98.7 F (37.1 C) (03/24 1250) BP: 149/126 (03/24 1500) Pulse Rate: 94 (03/24 1500)  Labs: Recent Labs    10/07/20 1337 10/07/20 1340  HGB 14.0  --   HCT 44.3  --   PLT 421*  --   LABPROT  --  20.4*  INR  --  1.8*  CREATININE 1.00  --   TROPONINIHS 28*  --     CrCl cannot be calculated (Unknown ideal weight.).   Medical History: Past Medical History:  Diagnosis Date  . Anxiety   . Bipolar 1 disorder (HCC)   . Bowel obstruction (HCC)   . Cataract   . Chronic atrial fibrillation (HCC) 09/2014   On chronic anticoagulation with coumadin.  She has failed several DCCVs in the past.  . COPD (chronic obstructive pulmonary disease) (HCC)   . Dyslipidemia, goal LDL below 70 12/25/2015  . Encounter for tobacco use cessation counseling 12/04/2017  . Glaucoma   . Hx of migraine headaches   . Memory loss   . Mitral regurgitation    mild to moderate on echo 09/2014  . Old MI (myocardial infarction) 09/2014   cath with normal coronary arteries and EF 50-55%  . Osteoarthritis   . Pulmonary HTN (HCC) 09/2014   Mild with PASP at cath  . RBBB   . Rheumatoid arthritis (HCC)   . Tobacco abuse    Assessment: 31 YOF admitted with afib with RVR on Coumadin PTA for afib. Pharmacy has been consulted to dose warfarin.   Home warfarin dose 2.5 mg daily, except for 5 mg on Tues/Thurs. INR on admit 1.8. LFTs WNL..   Goal of Therapy:  INR 2-3 Monitor platelets by anticoagulation protocol: Yes   Plan:  Warfarin 5 mg x1 Daily PT/INR Monitor CBC, s/s bleeding  Kinnie Feil, PharmD PGY1 Acute Care Pharmacy Resident 10/07/2020 3:31 PM  Please check [email or web address] for unit specific pharmacy phone numbers.

## 2020-10-07 NOTE — ED Notes (Signed)
RT at bedside for bipap application

## 2020-10-07 NOTE — ED Provider Notes (Addendum)
[facility] EMERGENCY DEPARTMENT Provider Note   CSN: [id number] Arrival date & time: 10/07/20  1242     History Chief Complaint  Patient presents with  . Shortness of Breath    Theresa Mendoza is a 75 y.o. female.  The history is provided by the patient.  Shortness of Breath Severity:  Moderate Onset quality:  Gradual Timing:  Constant Progression:  Worsening Chronicity:  New Context: activity (and now worse at rest. Extreme SOB going up and down stairs. Fluid gain in legs over the last several weeks. Sent by PCP with concern for fluid overload. Afib with RVR)   Relieved by:  Nothing Worsened by:  Exertion Associated symptoms: no abdominal pain, no chest pain, no cough, no ear pain, no fever, no rash, no sore throat and no vomiting   Risk factors comment:  On coumadin for Afib. HF, COPD and wears oxygen at night. New oxygen need during daytime with PCP today.      Past Medical History:  Diagnosis Date  . Anxiety   . Bipolar 1 disorder (HCC)   . Bowel obstruction (HCC)   . Cataract   . Chronic atrial fibrillation (HCC) 09/2014   On chronic anticoagulation with coumadin.  She has failed several DCCVs in the past.  . COPD (chronic obstructive pulmonary disease) (HCC)   . Dyslipidemia, goal LDL below 70 12/25/2015  . Encounter for tobacco use cessation counseling 12/04/2017  . Glaucoma   . Hx of migraine headaches   . Memory loss   . Mitral regurgitation    mild to moderate on echo 09/2014  . Old MI (myocardial infarction) 09/2014   cath with normal coronary arteries and EF 50-55%  . Osteoarthritis   . Pulmonary HTN (HCC) 09/2014   Mild with PASP at cath  . RBBB   . Rheumatoid arthritis (HCC)   . Tobacco abuse     Patient Active Problem List   Diagnosis Date Noted  . Encounter for tobacco use cessation counseling 12/04/2017  . Chronic respiratory failure with hypoxia (HCC) 06/18/2017  . COPD (chronic obstructive pulmonary disease) (HCC)  08/24/2016  . Chronic bronchitis (HCC) 08/24/2016  . Hypersomnia 08/24/2016  . Dyslipidemia, goal LDL below 70 12/25/2015  . CAD (coronary artery disease), native coronary artery 12/24/2015  . Old MI (myocardial infarction) 12/24/2015  . Atrial fibrillation (HCC) 12/24/2015  . Cognitive and behavioral changes 12/15/2015  . Tobacco use disorder 12/15/2015    Past Surgical History:  Procedure Laterality Date  . ADENOIDECTOMY    . BACK SURGERY    . bowel obstruction    . CARDIAC CATHETERIZATION  09/2014   normal coronary arteries with mild pulmonary HTN, low normal LVF  . CHOLECYSTECTOMY    . NOSE SURGERY    . TONSILLECTOMY    . TRANSESOPHAGEAL ECHOCARDIOGRAM WITH CARDIOVERSION  09/2014  . VAGINAL HYSTERECTOMY     with BSO     OB History   No obstetric history on file.     Family History  Problem Relation Age of Onset  . Tuberculosis Father   . Depression Father   . Alzheimer's disease Mother   . Lung cancer Mother     Social History   Tobacco Use  . Smoking status: Current Every Day Smoker    Packs/day: 2.00    Years: 55.00    Pack years: 110.00    Types: Cigarettes  . Smokeless tobacco: Never Used  . Tobacco comment: Has cut back to 1 pk  a day from 3 a day.  Substance Use Topics  . Alcohol use: No    Alcohol/week: 0.0 standard drinks  . Drug use: No    Home Medications Prior to Admission medications   Medication Sig Start Date End Date Taking? Authorizing Provider  albuterol (PROVENTIL HFA;VENTOLIN HFA) 108 (90 Base) MCG/ACT inhaler Inhale 2 puffs into the lungs every 6 (six) hours as needed for wheezing or shortness of breath. 08/24/16  Yes de Willshire, [place] A, MD  atenolol (TENORMIN) 25 MG tablet Take 25 mg by mouth daily. 11/28/15  Yes [provider]  diltiazem (CARDIZEM CD) 240 MG 24 hr capsule Take 240 mg by mouth daily. 11/01/15  Yes [provider]  DULoxetine (CYMBALTA) 60 MG capsule TAKE 2 CAPSULES BY MOUTH EVERY DAY Patient taking  differently: Take 120 mg by mouth daily. 10/09/19  Yes Plovsky, Earvin Hansen, MD  furosemide (LASIX) 40 MG tablet Take 40 mg by mouth 2 (two) times daily.  11/23/15  Yes [provider]  omeprazole (PRILOSEC) 40 MG capsule Take 40 mg by mouth in the morning. Before breakfast 09/07/20  Yes [provider]  pravastatin (PRAVACHOL) 20 MG tablet Take 20 mg by mouth daily. 09/07/20  Yes [provider]  warfarin (COUMADIN) 5 MG tablet Take 2.5-5 mg by mouth See admin instructions. Taking 2.5mg  on Mon, Wed, Friday, Sat, Sun. Taking 5 mg on Tues, Thursday 12/10/15  Yes [provider]  digoxin (LANOXIN) 0.125 MG tablet Take 1 tablet (0.125 mg total) by mouth daily. Please make overdue appt with Dr. Mayford Knife before anymore refills. 2nd attempt Patient not taking: No sig reported 09/30/19   Quintella Reichert, MD  doxepin (SINEQUAN) 50 MG capsule TAKE 1 CAPSULE (50 MG TOTAL) BY MOUTH AT BEDTIME. Patient not taking: No sig reported 08/13/19   Archer Asa, MD    Allergies    Lithium and Tegretol [carbamazepine]  Review of Systems   Review of Systems  Constitutional: Negative for chills and fever.  HENT: Negative for ear pain and sore throat.   Eyes: Negative for pain and visual disturbance.  Respiratory: Positive for shortness of breath. Negative for cough.   Cardiovascular: Positive for leg swelling. Negative for chest pain and palpitations.  Gastrointestinal: Negative for abdominal pain and vomiting.  Genitourinary: Negative for dysuria and hematuria.  Musculoskeletal: Negative for arthralgias and back pain.  Skin: Negative for color change and rash.  Neurological: Negative for seizures and syncope.  All other systems reviewed and are negative.   Physical Exam Updated Vital Signs  ED Triage Vitals [10/07/20 1250]  Enc Vitals Group     BP (!) 144/115     Pulse Rate (!) 39     Resp (!) 26     Temp 98.7 F (37.1 C)     Temp src      SpO2 95 %     Weight      Height       Head Circumference      Peak Flow      Pain Score      Pain Loc      Pain Edu?      Excl. in GC?     Physical Exam Vitals and nursing note reviewed.  Constitutional:      General: She is not in acute distress.    Appearance: She is well-developed. She is not ill-appearing.  HENT:     Head: Normocephalic and atraumatic.  Eyes:     Conjunctiva/sclera:  Conjunctivae normal.     Pupils: Pupils are equal, round, and reactive to light.  Cardiovascular:     Rate and Rhythm: Normal rate and regular rhythm.     Pulses: Normal pulses.     Heart sounds: Normal heart sounds. No murmur heard.   Pulmonary:     Effort: Tachypnea present. No respiratory distress.     Breath sounds: Decreased breath sounds and rhonchi present.     Comments: On 2L on [place], 88 percent on room air  Abdominal:     Palpations: Abdomen is soft.     Tenderness: There is no abdominal tenderness.  Musculoskeletal:     Cervical back: Normal range of motion and neck supple.     Right lower leg: Edema (3+) present.     Left lower leg: Edema (3+) present.     Comments: Left > right leg swelling (pt states similar to in past)   Skin:    General: Skin is warm and dry.  Neurological:     General: No focal deficit present.     Mental Status: She is alert.     ED Results / Procedures / Treatments   Labs (all labs ordered are listed, but only abnormal results are displayed) Labs Reviewed  BASIC METABOLIC PANEL - Abnormal; Notable for the following components:      Result Value   Chloride 97 (*)    Glucose, Bld 110 (*)    BUN 29 (*)    GFR, Estimated 59 (*)    All other components within normal limits  CBC - Abnormal; Notable for the following components:   WBC 13.1 (*)    Platelets 421 (*)    All other components within normal limits  BRAIN NATRIURETIC PEPTIDE - Abnormal; Notable for the following components:   B Natriuretic Peptide 702.4 (*)    All other components within normal limits  PROTIME-INR -  Abnormal; Notable for the following components:   Prothrombin Time 20.4 (*)    INR 1.8 (*)    All other components within normal limits  TROPONIN I (HIGH SENSITIVITY) - Abnormal; Notable for the following components:   Troponin I (High Sensitivity) 28 (*)    All other components within normal limits  RESP PANEL BY RT-PCR (FLU A&B, COVID) ARPGX2  HEPATIC FUNCTION PANEL  DIGOXIN LEVEL  LACTIC ACID, PLASMA  LACTIC ACID, PLASMA  PROCALCITONIN  TROPONIN I (HIGH SENSITIVITY)    EKG EKG Interpretation  Date/Time:  Thursday October 07 2020 12:50:06 EDT Ventricular Rate:  129 PR Interval:    QRS Duration: 138 QT Interval:  350 QTC Calculation: 512 R Axis:   -72 Text Interpretation: Atrial fibrillation with rapid ventricular response with premature ventricular or aberrantly conducted complexes Left axis deviation Right bundle branch block Minimal voltage criteria for LVH, may be normal variant ( R in aVL ) Anterior infarct , age undetermined T wave abnormality, consider lateral ischemia Abnormal ECG Confirmed by Virgina Norfolk [id number]) on 10/07/2020 1:11:22 PM   Radiology DG Chest 2 View  Result Date: 10/07/2020 CLINICAL DATA:  Shortness of breath. EXAM: CHEST - 2 VIEW COMPARISON:  08/24/2016 FINDINGS: Two-view exam shows right base airspace disease, suspicious for pneumonia. There is a small right pleural effusion. Left lung clear. Interstitial markings are diffusely coarsened with chronic features. Cardiopericardial silhouette is at upper limits of normal for size. The visualized bony structures of the thorax show no acute abnormality. IMPRESSION: Right base airspace disease suspicious for pneumonia with small right pleural effusion.  Follow-up imaging recommended to ensure complete resolution. Electronically Signed   By: Kennith Center M.D.   On: 10/07/2020 13:36    Procedures .Critical Care Performed by: Virgina Norfolk, DO Authorized by: Virgina Norfolk, DO   Critical care provider  statement:    Critical care time (minutes):  35   Critical care was necessary to treat or prevent imminent or life-threatening deterioration of the following conditions:  Respiratory failure   Critical care was time spent personally by me on the following activities:  Blood draw for specimens, development of treatment plan with patient or surrogate, discussions with primary provider, evaluation of patient's response to treatment, examination of patient, obtaining history from patient or surrogate, ordering and performing treatments and interventions, ordering and review of laboratory studies, ordering and review of radiographic studies, pulse oximetry, re-evaluation of patient's condition and review of old charts   I assumed direction of critical care for this patient from another provider in my specialty: no     Care discussed with: admitting provider       Medications Ordered in ED Medications  furosemide (LASIX) injection 60 mg (60 mg Intravenous Given 10/07/20 1351)    ED Course  I have reviewed the triage vital signs and the nursing notes.  Pertinent labs & imaging results that were available during my care of the patient were reviewed by me and considered in my medical decision making (see chart for details).    MDM Rules/Calculators/A&P                          AYUMI WANGERIN is a 75 year old female with history of COPD, heart failure, A. fib on Coumadin who presents to the ED with shortness of breath.  Found to be in A. fib with RVR but upon my evaluation heart rate is between 101 115.  Patient hypoxic to the mid 80s and placed on 2 L of oxygen with improvement.  Wears nighttime oxygen but does not normally have to wear oxygen during the day.  She is tachypneic.  She appears to be volume overloaded.  3+ pitting edema in her legs.  Has slightly more swelling in the left leg than the right leg but she states that this is normal for her.  She has rhonchi on exam.  Overall suspect heart  failure exacerbation.  Appears to have been gradual with leg swelling over the last several weeks with the shortness of breath getting worse over the last 2 days.  She has orthopnea.  Denies any chest pain.  States that she has been compliant with her medications.  Will give a dose of IV Lasix as I believe this is heart failure.  Suspect that heart failure and fluid overload is causing the A. fib with RVR.  We will hold off on any rate control medication at this time as she is having highly variable heart rate between 100  And 125.  Suspect that this will improve with her home meds and IV doses of Lasix.  She denies any infectious symptoms.  She is fully vaccinated against COVID.  At this time I do not believe she needs BiPAP but will consider.  Patient feeling better after Lasix.  She is already having some good urinary output.  Stable on 2 L of oxygen.  Heart rate has also improved to the 90s.  She does white count of 13.  BNP is 702.  Troponin mildly elevated at 28.  Covid test is  negative.  Chest x-ray shows some signs of volume overload, may be possible pneumonia, small pleural effusion.  Overall I suspect this is a heart failure exacerbation.  Have low suspicion for infectious process.  No fever.  We will add a procalcitonin and lactic acid to further evaluate for infectious process.  Believe patient would benefit from aggressive diuresis.  Will admit to medicine.  This chart was dictated using voice recognition software.  Despite best efforts to proofread,  errors can occur which can change the documentation meaning.    Final Clinical Impression(s) / ED Diagnoses Final diagnoses:  Acute respiratory failure with hypoxia (HCC)  Acute on chronic heart failure, unspecified heart failure type [facility])  Atrial fibrillation with RVR [facility])    Rx / DC Orders ED Discharge Orders    None       Virgina Norfolk, DO 10/07/20 1453    Virgina Norfolk, DO 10/07/20 1503

## 2020-10-07 NOTE — Telephone Encounter (Signed)
Patient's daughter in law calling to inform Dr. Mayford Knife the patient is in the hospital.

## 2020-10-07 NOTE — Telephone Encounter (Signed)
Spoke with the patient's daughter in laws who states that the patient has not been taking her digoxin because she ran out and needs a refill. She is currently in the hospital and is in A Fib. She has an appointment with Dr. Mayford Knife in April.

## 2020-10-07 NOTE — ED Triage Notes (Signed)
Pt here from MD office with c/o sob afib rvr , pt has hx of copd  And still smokes uses O2 at night as needed

## 2020-10-07 NOTE — H&P (Addendum)
History and Physical    Theresa Mendoza:[id number] DOB: 1945/08/06 DOA: 10/07/2020  PCP: Pcp, No (Confirm with patient/family/NH records and if not entered, this has to be entered at [facility] point of entry) Patient coming from: Home  I have personally briefly reviewed patient's old medical records in [facility]  Chief Complaint: SOB  HPI: Theresa Mendoza is a 75 y.o. female with medical history significant of PAF on Coumadin, COPD Gold stage II with chronic hypoxic respiratory on home oxygen at bedtime, cigarette smoker, scented with increasing shortness of breath.  Patient claims she has been compliant with her Lasix and atenolol, which family confirmed as well.  Over the last 2 weeks, patient quickly developed breathing symptoms of shortness of breath, has been exertional, but her exercise tolerance has significantly decreased, since last week, walking short distance inside her house causing significant breathing symptoms.  Denies any cough, no chest pain no fever chills. She used to follow with cardiology every 6 months, but lost follow-up since pandemic.  Patient smokes 1 pack a day, as per patient son over the phone, and son reported patient has had dry mouth due to chronic smoking, as a result has been drinking extra amount of fluid throughout the day.  Patient herself admitted about 1 gallon a day.  She also noticed gradual swelling of her legs, denies any pain or rash.  Patient reported she ran out nebulizing agent for several months. ED Course: Patient was found to have fluid overload, chest x-ray showed lungs congested but suspect right lower lobe infiltrates suspicious for pneumonia.  WBC 13.1. Received 1 dose of Lasix in ED.  Review of Systems: As per HPI otherwise 14 point review of systems negative.    Past Medical History:  Diagnosis Date  . Anxiety   . Bipolar 1 disorder (HCC)   . Bowel obstruction (HCC)   . Cataract   . Chronic atrial fibrillation (HCC) 09/2014    On chronic anticoagulation with coumadin.  She has failed several DCCVs in the past.  . COPD (chronic obstructive pulmonary disease) (HCC)   . Dyslipidemia, goal LDL below 70 12/25/2015  . Encounter for tobacco use cessation counseling 12/04/2017  . Glaucoma   . Hx of migraine headaches   . Memory loss   . Mitral regurgitation    mild to moderate on echo 09/2014  . Old MI (myocardial infarction) 09/2014   cath with normal coronary arteries and EF 50-55%  . Osteoarthritis   . Pulmonary HTN (HCC) 09/2014   Mild with PASP at cath  . RBBB   . Rheumatoid arthritis (HCC)   . Tobacco abuse     Past Surgical History:  Procedure Laterality Date  . ADENOIDECTOMY    . BACK SURGERY    . bowel obstruction    . CARDIAC CATHETERIZATION  09/2014   normal coronary arteries with mild pulmonary HTN, low normal LVF  . CHOLECYSTECTOMY    . NOSE SURGERY    . TONSILLECTOMY    . TRANSESOPHAGEAL ECHOCARDIOGRAM WITH CARDIOVERSION  09/2014  . VAGINAL HYSTERECTOMY     with BSO     reports that she has been smoking cigarettes. She has a 110.00 pack-year smoking history. She has never used smokeless tobacco. She reports that she does not drink alcohol and does not use drugs.  Allergies  Allergen Reactions  . Lithium     Patient became suicidal  . Tegretol [Carbamazepine]     UNK reaction    Family  History  Problem Relation Age of Onset  . Tuberculosis Father   . Depression Father   . Alzheimer's disease Mother   . Lung cancer Mother     Prior to Admission medications   Medication Sig Start Date End Date Taking? Authorizing Provider  albuterol (PROVENTIL HFA;VENTOLIN HFA) 108 (90 Base) MCG/ACT inhaler Inhale 2 puffs into the lungs every 6 (six) hours as needed for wheezing or shortness of breath. 08/24/16  Yes de [place], [place] A, MD  atenolol (TENORMIN) 25 MG tablet Take 25 mg by mouth daily. 11/28/15  Yes [provider]  diltiazem (CARDIZEM CD) 240 MG 24 hr capsule Take 240 mg  by mouth daily. 11/01/15  Yes [provider]  DULoxetine (CYMBALTA) 60 MG capsule TAKE 2 CAPSULES BY MOUTH EVERY DAY Patient taking differently: Take 120 mg by mouth daily. 10/09/19  Yes Plovsky, Earvin Hansen, MD  furosemide (LASIX) 40 MG tablet Take 40 mg by mouth 2 (two) times daily.  11/23/15  Yes [provider]  omeprazole (PRILOSEC) 40 MG capsule Take 40 mg by mouth in the morning. Before breakfast 09/07/20  Yes [provider]  pravastatin (PRAVACHOL) 20 MG tablet Take 20 mg by mouth daily. 09/07/20  Yes [provider]  warfarin (COUMADIN) 5 MG tablet Take 2.5-5 mg by mouth See admin instructions. Taking 2.5mg  on Mon, Wed, Friday, Sat, Sun. Taking 5 mg on Tues, Thursday 12/10/15  Yes [provider]  digoxin (LANOXIN) 0.125 MG tablet Take 1 tablet (0.125 mg total) by mouth daily. Please make overdue appt with Dr. Mayford Knife before anymore refills. 2nd attempt Patient not taking: No sig reported 09/30/19   Quintella Reichert, MD  doxepin (SINEQUAN) 50 MG capsule TAKE 1 CAPSULE (50 MG TOTAL) BY MOUTH AT BEDTIME. Patient not taking: No sig reported 08/13/19   Archer Asa, MD    Physical Exam: Vitals:   10/07/20 1251 10/07/20 1318 10/07/20 1400 10/07/20 1500  BP:  137/87 128/83 (!) 149/126  Pulse: 62 (!) 127 89 94  Resp:  (!) 33 (!) 23 (!) 28  Temp:      SpO2:  97% 91% (!) 88%    Constitutional: NAD, calm, comfortable Vitals:   10/07/20 1251 10/07/20 1318 10/07/20 1400 10/07/20 1500  BP:  137/87 128/83 (!) 149/126  Pulse: 62 (!) 127 89 94  Resp:  (!) 33 (!) 23 (!) 28  Temp:      SpO2:  97% 91% (!) 88%   Eyes: PERRL, lids and conjunctivae normal ENMT: Mucous membranes are moist. Posterior pharynx clear of any exudate or lesions.Normal dentition.  Neck: normal, supple, no masses, no thyromegaly, JVD >7cm above clavicle Respiratory: Diminished breathing sound bilaterally, diffused wheezing, scattered crackles to the mid level bilaterally, increasing  breathing effort, no accessory muscle use.  Cardiovascular: Irregular heart rate, no murmurs / rubs / gallops.  Anasarca. 2+ pedal pulses. No carotid bruits.  Abdomen: no tenderness, no masses palpated. No hepatosplenomegaly. Bowel sounds positive.  Musculoskeletal: no clubbing / cyanosis. No joint deformity upper and lower extremities. Good ROM, no contractures. Normal muscle tone.  Skin: no rashes, lesions, ulcers. No induration Neurologic: CN 2-12 grossly intact. Sensation intact, DTR normal. Strength 5/5 in all 4.  Psychiatric: Normal judgment and insight. Alert and oriented x 3. Normal mood.     Labs on Admission: I have personally reviewed following labs and imaging studies  CBC: Recent Labs  Lab 10/07/20 1337  WBC 13.1*  HGB 14.0  HCT 44.3  MCV 91.2  PLT 421*   Basic Metabolic Panel: Recent Labs  Lab 10/07/20 1337  NA 138  K 4.2  CL 97*  CO2 31  GLUCOSE 110*  BUN 29*  CREATININE 1.00  CALCIUM 9.3   GFR: CrCl cannot be calculated (Unknown ideal weight.). Liver Function Tests: Recent Labs  Lab 10/07/20 1340  AST 17  ALT 21  ALKPHOS 65  BILITOT 0.7  PROT 6.5  ALBUMIN 3.6   No results for input(s): LIPASE, AMYLASE in the last 168 hours. No results for input(s): AMMONIA in the last 168 hours. Coagulation Profile: Recent Labs  Lab 10/07/20 1340  INR 1.8*   Cardiac Enzymes: No results for input(s): CKTOTAL, CKMB, CKMBINDEX, TROPONINI in the last 168 hours. BNP (last 3 results) No results for input(s): PROBNP in the last 8760 hours. HbA1C: No results for input(s): HGBA1C in the last 72 hours. CBG: No results for input(s): GLUCAP in the last 168 hours. Lipid Profile: No results for input(s): CHOL, HDL, LDLCALC, TRIG, CHOLHDL, LDLDIRECT in the last 72 hours. Thyroid Function Tests: No results for input(s): TSH, T4TOTAL, FREET4, T3FREE, THYROIDAB in the last 72 hours. Anemia Panel: No results for input(s): VITAMINB12, FOLATE, FERRITIN, TIBC, IRON,  RETICCTPCT in the last 72 hours. Urine analysis: No results found for: COLORURINE, APPEARANCEUR, LABSPEC, PHURINE, GLUCOSEU, HGBUR, BILIRUBINUR, KETONESUR, PROTEINUR, UROBILINOGEN, NITRITE, LEUKOCYTESUR  Radiological Exams on Admission: DG Chest 2 View  Result Date: 10/07/2020 CLINICAL DATA:  Shortness of breath. EXAM: CHEST - 2 VIEW COMPARISON:  08/24/2016 FINDINGS: Two-view exam shows right base airspace disease, suspicious for pneumonia. There is a small right pleural effusion. Left lung clear. Interstitial markings are diffusely coarsened with chronic features. Cardiopericardial silhouette is at upper limits of normal for size. The visualized bony structures of the thorax show no acute abnormality. IMPRESSION: Right base airspace disease suspicious for pneumonia with small right pleural effusion. Follow-up imaging recommended to ensure complete resolution. Electronically Signed   By: Kennith Center M.D.   On: 10/07/2020 13:36    EKG: Independently reviewed.  Rapid A. fib  Assessment/Plan Active Problems:   CHF (congestive heart failure) (HCC)  (please populate well all problems here in Problem List. (For example, if patient is on BP meds at home and you resume or decide to hold them, it is a problem that needs to be her. Same for CAD, COPD, HLD and so on)  Acute on chronic diastolic CHF decompensation -Likely secondary to combined effect from uncontrolled A. fib and noncompliant with digoxin as well as fluid restriction. -Fluid overload, most recent recorded weight 68 kg in 2019, today's is 72 kg. -Confirmed with [facility] that patient has not been refilled digoxin since 01/2020, will reload digoxin.  Discontinue diltiazem given patient is seen CHF decompensation.  Increased her atenolol from 25 to 50 mg daily, add PRN metoprolol. -Fluid restriction of 1800 mL/day, educated patient as well as her son over the phone. -Echocardiogram. -IV Lasix BID for now.  A. fib with RVR -Medications as  above -K level ok, will check Mg -Avoid albuterol. -Continue Coumadin  Acute COPD exacerbation -Likely from continuous smoking and noncompliant with nebulizer. -Short course of p.o. steroid, Atrovent and Advair -Avoid albuterol. -P.o. doxycycline  Question of PNA -Infiltrates on x-ray appears to be bilateral, procalcitonin level pending. -P.o. doxycycline for COPD.  Chronic hypoxic respite failure -Worsening with combined CHF and COPD exacerbation, as needed BiPAP  Cigarette smoker -Nicotine patch, educated to quit.  DVT prophylaxis: Coumadin Code Status: Full Code Family Communication: Son  over phone Disposition Plan: Expect 2-3 days hospital stay to treat CHF and COPD exacerbation. Consults called: None Admission status: Tele admit   Emeline General MD [facility] Pager [phone]  10/07/2020, 3:24 PM

## 2020-10-07 NOTE — Progress Notes (Signed)
RT assisted with transportation of this pt from ED room 21 to 3E23 while on BiPAP. Pt tolerated well with SVS.

## 2020-10-08 ENCOUNTER — Inpatient Hospital Stay (HOSPITAL_COMMUNITY): Payer: Medicare Other

## 2020-10-08 ENCOUNTER — Other Ambulatory Visit: Payer: Self-pay

## 2020-10-08 DIAGNOSIS — I5043 Acute on chronic combined systolic (congestive) and diastolic (congestive) heart failure: Secondary | ICD-10-CM | POA: Diagnosis not present

## 2020-10-08 DIAGNOSIS — J9601 Acute respiratory failure with hypoxia: Secondary | ICD-10-CM

## 2020-10-08 DIAGNOSIS — I5033 Acute on chronic diastolic (congestive) heart failure: Secondary | ICD-10-CM

## 2020-10-08 DIAGNOSIS — M7989 Other specified soft tissue disorders: Secondary | ICD-10-CM

## 2020-10-08 LAB — ECHOCARDIOGRAM COMPLETE
AR max vel: 1.37 cm2
AV Area VTI: 1.19 cm2
AV Area mean vel: 1.23 cm2
AV Mean grad: 14.5 mmHg
AV Peak grad: 24.1 mmHg
Ao pk vel: 2.46 m/s
Calc EF: 44.8 %
Height: 65 in
S' Lateral: 3.1 cm
Single Plane A2C EF: 42.1 %
Single Plane A4C EF: 54.5 %
Weight: 2765.45 oz

## 2020-10-08 LAB — BASIC METABOLIC PANEL
Anion gap: 10 (ref 5–15)
BUN: 22 mg/dL (ref 8–23)
CO2: 33 mmol/L — ABNORMAL HIGH (ref 22–32)
Calcium: 9.2 mg/dL (ref 8.9–10.3)
Chloride: 93 mmol/L — ABNORMAL LOW (ref 98–111)
Creatinine, Ser: 1.11 mg/dL — ABNORMAL HIGH (ref 0.44–1.00)
GFR, Estimated: 52 mL/min — ABNORMAL LOW (ref >60)
Glucose, Bld: 131 mg/dL — ABNORMAL HIGH (ref 70–99)
Potassium: 3.4 mmol/L — ABNORMAL LOW (ref 3.5–5.1)
Sodium: 136 mmol/L (ref 135–145)

## 2020-10-08 LAB — CBC
HCT: 41.7 % (ref 36.0–46.0)
Hemoglobin: 13.8 g/dL (ref 12.0–15.0)
MCH: 29.1 pg (ref 26.0–34.0)
MCHC: 33.1 g/dL (ref 30.0–36.0)
MCV: 87.8 fL (ref 80.0–100.0)
Platelets: 363 10*3/uL (ref 150–400)
RBC: 4.75 MIL/uL (ref 3.87–5.11)
RDW: 15 % (ref 11.5–15.5)
WBC: 12.8 10*3/uL — ABNORMAL HIGH (ref 4.0–10.5)
nRBC: 0 % (ref 0.0–0.2)

## 2020-10-08 LAB — PROTIME-INR
INR: 1.4 — ABNORMAL HIGH (ref 0.8–1.2)
Prothrombin Time: 16.9 seconds — ABNORMAL HIGH (ref 11.4–15.2)

## 2020-10-08 MED ORDER — INFLUENZA VAC A&B SA ADJ QUAD 0.5 ML IM PRSY
0.5000 mL | PREFILLED_SYRINGE | INTRAMUSCULAR | Status: AC
  Administered 2020-10-09: 0.5 mL via INTRAMUSCULAR
  Filled 2020-10-08: qty 0.5

## 2020-10-08 MED ORDER — METHYLPREDNISOLONE SODIUM SUCC 125 MG IJ SOLR
80.0000 mg | Freq: Two times a day (BID) | INTRAMUSCULAR | Status: DC
  Administered 2020-10-08 – 2020-10-10 (×5): 80 mg via INTRAVENOUS
  Filled 2020-10-08 (×5): qty 2

## 2020-10-08 MED ORDER — DOCUSATE SODIUM 100 MG PO CAPS
100.0000 mg | ORAL_CAPSULE | Freq: Two times a day (BID) | ORAL | Status: DC
  Administered 2020-10-08 – 2020-10-10 (×4): 100 mg via ORAL
  Filled 2020-10-08 (×5): qty 1

## 2020-10-08 MED ORDER — LIDOCAINE 5 % EX PTCH
1.0000 | MEDICATED_PATCH | CUTANEOUS | Status: DC
  Administered 2020-10-08 – 2020-10-13 (×6): 1 via TRANSDERMAL
  Filled 2020-10-08 (×6): qty 1

## 2020-10-08 MED ORDER — METOPROLOL TARTRATE 50 MG PO TABS: 50.0000 mg | ORAL_TABLET | Freq: Two times a day (BID) | ORAL | Status: DC

## 2020-10-08 MED ORDER — LEVALBUTEROL HCL 0.63 MG/3ML IN NEBU
0.6300 mg | INHALATION_SOLUTION | Freq: Three times a day (TID) | RESPIRATORY_TRACT | Status: DC
  Administered 2020-10-08 – 2020-10-10 (×6): 0.63 mg via RESPIRATORY_TRACT
  Filled 2020-10-08 (×7): qty 3

## 2020-10-08 MED ORDER — WARFARIN SODIUM 5 MG PO TABS
5.0000 mg | ORAL_TABLET | Freq: Once | ORAL | Status: AC
  Administered 2020-10-08: 5 mg via ORAL
  Filled 2020-10-08: qty 1

## 2020-10-08 MED ORDER — METOPROLOL TARTRATE 25 MG PO TABS
25.0000 mg | ORAL_TABLET | Freq: Two times a day (BID) | ORAL | Status: DC
  Administered 2020-10-09 – 2020-10-11 (×5): 25 mg via ORAL
  Filled 2020-10-08 (×6): qty 1

## 2020-10-08 MED ORDER — ENOXAPARIN SODIUM 80 MG/0.8ML ~~LOC~~ SOLN
80.0000 mg | Freq: Two times a day (BID) | SUBCUTANEOUS | Status: DC
  Administered 2020-10-08 – 2020-10-09 (×3): 80 mg via SUBCUTANEOUS
  Filled 2020-10-08 (×3): qty 0.8

## 2020-10-08 MED ORDER — AMIODARONE LOAD VIA INFUSION
150.0000 mg | Freq: Once | INTRAVENOUS | Status: AC
  Administered 2020-10-08: 150 mg via INTRAVENOUS

## 2020-10-08 MED ORDER — DIGOXIN 125 MCG PO TABS
0.1250 mg | ORAL_TABLET | Freq: Every day | ORAL | Status: DC
  Administered 2020-10-09 – 2020-10-13 (×5): 0.125 mg via ORAL
  Filled 2020-10-08 (×5): qty 1

## 2020-10-08 MED ORDER — IPRATROPIUM BROMIDE 0.02 % IN SOLN
0.5000 mg | Freq: Three times a day (TID) | RESPIRATORY_TRACT | Status: DC
  Administered 2020-10-08 – 2020-10-10 (×6): 0.5 mg via RESPIRATORY_TRACT
  Filled 2020-10-08 (×7): qty 2.5

## 2020-10-08 MED ORDER — DILTIAZEM HCL ER 60 MG PO CP12: 120.0000 mg | ORAL_CAPSULE | Freq: Two times a day (BID) | ORAL | Status: DC

## 2020-10-08 MED ORDER — AMIODARONE HCL IN DEXTROSE 360-4.14 MG/200ML-% IV SOLN
60.0000 mg/h | INTRAVENOUS | Status: AC
  Administered 2020-10-08: 60 mg/h via INTRAVENOUS
  Filled 2020-10-08: qty 200

## 2020-10-08 MED ORDER — POTASSIUM CHLORIDE CRYS ER 20 MEQ PO TBCR
40.0000 meq | EXTENDED_RELEASE_TABLET | ORAL | Status: AC
  Administered 2020-10-08 (×2): 40 meq via ORAL
  Filled 2020-10-08 (×2): qty 2

## 2020-10-08 MED ORDER — LORAZEPAM 2 MG/ML IJ SOLN
0.5000 mg | Freq: Once | INTRAMUSCULAR | Status: AC | PRN
  Administered 2020-10-08: 0.5 mg via INTRAVENOUS
  Filled 2020-10-08: qty 1

## 2020-10-08 MED ORDER — ATENOLOL 50 MG PO TABS
50.0000 mg | ORAL_TABLET | Freq: Every day | ORAL | Status: DC
  Administered 2020-10-08: 50 mg via ORAL
  Filled 2020-10-08: qty 1

## 2020-10-08 MED ORDER — AMIODARONE HCL IN DEXTROSE 360-4.14 MG/200ML-% IV SOLN
30.0000 mg/h | INTRAVENOUS | Status: DC
  Administered 2020-10-08 – 2020-10-09 (×3): 30 mg/h via INTRAVENOUS
  Filled 2020-10-08 (×4): qty 200

## 2020-10-08 MED ORDER — METOPROLOL TARTRATE 5 MG/5ML IV SOLN
2.5000 mg | INTRAVENOUS | Status: DC | PRN
  Administered 2020-10-10: 2.5 mg via INTRAVENOUS
  Filled 2020-10-08: qty 5

## 2020-10-08 MED ORDER — AMIODARONE LOAD VIA INFUSION
150.0000 mg | Freq: Once | INTRAVENOUS | Status: AC
  Administered 2020-10-08: 150 mg via INTRAVENOUS
  Filled 2020-10-08: qty 83.34

## 2020-10-08 NOTE — Significant Event (Signed)
Rapid Response Event Note   Reason for Call : HR 150-170s Initial Focused Assessment:  -Alert and appropriated, Sitting on side of the bed leaning over in moderate distress with accessory muscle use. RR 28-30 after voiding. -BBS diminished with some exp wheeze  -12 lead EKG showed Aflutter 150s while on Amio gtt -Pt repositioned and placed back on Bipap at previous settings.  -Pt answered yes when asked if breathing was better on Bipap  0440- HR 146 aflutter, 121/83, RR 28 with sats 94% on BIPAP 10/5 at 30% fio2  Interventions:  -Bipap -Amio bolus 150 mg   MD Notified: Dr Antionette Char at bedside Call Time: 0350 Arrival Time: 0405 End Time: 0445  Rose Fillers, RN

## 2020-10-08 NOTE — Progress Notes (Addendum)
During shift change dayshift residency RN stated to me that earlier the pt stated  "Please give me something to end this and I will not tell anyone". Both the dayshift nurse and I asked the patient if she had intentions of hurting herself and the pt stated she had no plan or intentions of self harm. MD Allena Katz notified by dayshift and Opyd MD notified by myself.

## 2020-10-08 NOTE — Progress Notes (Signed)
Just before shift change. Pt made statement to Residency RN about wanting to end this. With primary RN and night shift RN patient stated that she would not hurt herself. MD notified.

## 2020-10-08 NOTE — Progress Notes (Signed)
Cross-coverage note:   Patient with a fib on coumadin admitted yesterday with RVR and acute CHF now has HR sustaining 130-150 despite IV Lopressor. SBP 90s. Plan to start IV amiodarone.

## 2020-10-08 NOTE — Progress Notes (Signed)
PT Cancellation Note  Patient Details Name: Theresa Mendoza MRN: [id number] DOB: 07-11-1946   Cancelled Treatment:    Reason Eval/Treat Not Completed: Patient not medically ready;Patient declined, no reason specified.  Pt reports new onset of nausea during her bath and is deferring the evaluation today. 10/08/2020  Jacinto Halim., PT [facility] [phone]  (pager) [phone]  (office)   Eliseo Gum Raijon Lindfors 10/08/2020, 2:52 PM

## 2020-10-08 NOTE — Progress Notes (Signed)
Pt. HR sustaining in 130s-150s. PRN med given and ineffective. On call for [facility] paged to make aware. See new orders.

## 2020-10-08 NOTE — Progress Notes (Signed)
HR up to 168. Sustaining 130s-140s. On call for [facility] paged to make aware.

## 2020-10-08 NOTE — Progress Notes (Signed)
[facility] Progress Note  Patient: Theresa Mendoza    ZOX:[id number]  DOA: 10/07/2020     Date of Service: the patient was seen and examined on 10/08/2020  Brief hospital course: Past medical history of PAF, on Coumadin, COPD, chronic hypoxic respiratory failure, smoker.  Presents with complaints of cough and shortness of breath.  Found to have COPD exacerbation as well as volume overload.  Needing BiPAP on admission. Currently plan is continue respiratory support, IV diuresis and treat COPD exacerbation.  Assessment and Plan: 1.  Acute on chronic combined systolic and diastolic CHF Continue with IV diuresis. Echocardiogram shows EF of 45%. Previous cardiogram in 2017 shows preserved EF. Likely current TSH tachycardia induced production patient denies any complaints of chest pain chest tightness or ACS. Continue to monitor for now. We will consult cardiology for further assistance. Continue BiPAP as needed.  2.  Acute COPD exacerbation Right basilar pneumonia Continue DuoNebs. Continue steroids. Change from prednisone to Solu-Medrol. Continue with antibiotics.  3.  Chronic A. fib with RVR Patient is supposed to be on Cardizem, atenolol as well as digoxin. She ran out of her digoxin. Currently she is on amiodarone, digoxin as well as beta-blocker. In the setting of low EF holding Cardizem. Continue Coumadin. Bridge with Lovenox until INR is subtherapeutic  4.  Cigarette smoker. Nicotine patch.  Diet: Cardiac diet DVT Prophylaxis: Therapeutic Anticoagulation with Warfarin and Lovenox      Advance goals of care discussion: Full code  Family Communication: no family was present at bedside, at the time of interview.   Disposition:  Status is: Inpatient  Remains inpatient appropriate because:IV treatments appropriate due to intensity of illness or inability to take PO   Dispo: The patient is from: Home              Anticipated d/c is to: Home               Patient currently is not medically stable to d/c.   Difficult to place patient   Subjective: No nausea no vomiting.  No fever no chills.  Continues to have shortness of breath.  Continues to have cough.  Reports chest pain as well as back pain which is pleuritic in nature.  Physical Exam:  General: Appear in mild distress, no Rash; Oral Mucosa Clear, moist. no Abnormal Neck Mass Or lumps, Conjunctiva normal  Cardiovascular: S1 and S2 Present, no Murmur, Respiratory: increased respiratory effort, Bilateral Air entry present and bilateral  Crackles, bilateral wheezes Abdomen: Bowel Sound present, Soft and no tenderness Extremities: trace Pedal edema Neurology: alert and oriented to time, place, and person affect appropriate. no new focal deficit Gait not checked due to patient safety concerns    Vitals:   10/08/20 1204 10/08/20 1300 10/08/20 1642 10/08/20 1900  BP: 139/61 (!) 127/54 123/60 130/89  Pulse: 88 84 93 (!) 111  Resp: (!) 25  Temp: 98.2 F (36.8 C)  (!) 97.5 F (36.4 C) 99 F (37.2 C)  TempSrc: Oral   Oral  SpO2: (!) 62% 96% 96% 97%  Weight:      Height:        Intake/Output Summary (Last 24 hours) at 10/08/2020 1937 Last data filed at 10/08/2020 1937 Gross per 24 hour  Intake 832.29 ml  Output 1000 ml  Net -167.71 ml   Filed Weights   10/07/20 1725 10/08/20 0100  Weight: 78.8 kg 78.4 kg    Data Reviewed: I have personally reviewed and interpreted  daily labs, tele strips, imaging. I reviewed all nursing notes, pharmacy notes, vitals, pertinent old records I have discussed plan of care as described above with RN and patient/family.  CBC: Recent Labs  Lab 10/07/20 1337 10/08/20 0408  WBC 13.1* 12.8*  HGB 14.0 13.8  HCT 44.3 41.7  MCV 91.2 87.8  PLT 421* 363   Basic Metabolic Panel: Recent Labs  Lab 10/07/20 1337 10/07/20 1805 10/08/20 0408  NA 138  --  136  K 4.2  --  3.4*  CL 97*  --  93*  CO2 31  --  33*  GLUCOSE 110*  --  131*  BUN  29*  --  22  CREATININE 1.00  --  1.11*  CALCIUM 9.3  --  9.2  MG  --  2.0  --     Studies: ECHOCARDIOGRAM COMPLETE  Result Date: 10/08/2020    ECHOCARDIOGRAM REPORT   Patient Name:   Theresa Mendoza Date of Exam: 10/08/2020 Medical Rec #:  [id number]        Height:       65.0 in Accession #:    [id number]       Weight:       172.8 lb Date of Birth:  04-Sep-1945       BSA:          1.859 m Patient Age:    74 years         BP:           113/79 mmHg Patient Gender: F                HR:           74 bpm. Exam Location:  Inpatient Procedure: 2D Echo, Cardiac Doppler and Color Doppler Indications:    Stroke  History:        Patient has no prior history of Echocardiogram examinations.  Sonographer:    Roosvelt Maser RDCS Referring Phys: [id number] Emeline General IMPRESSIONS  1. Left ventricular ejection fraction, by estimation, is 40 to 45%. The left ventricle has mildly decreased function. The left ventricle demonstrates global hypokinesis. Left ventricular diastolic function could not be evaluated. There is global left ventricular hypokinesis, but there appears to be disproportionate hypokinesis of the inferior wall and inferior septum.  2. Right ventricular systolic function is moderately reduced. The right ventricular size is mildly enlarged. There is mildly elevated pulmonary artery systolic pressure.  3. Left atrial size was moderately dilated.  4. The mitral valve is normal in structure. Mild mitral valve regurgitation. No evidence of mitral stenosis.  5. The aortic valve is tricuspid. There is moderate calcification of the aortic valve. There is moderate thickening of the aortic valve. Aortic valve regurgitation is mild to moderate. Mild to moderate aortic valve stenosis. Aortic valve mean gradient measures 14.5 mmHg. Aortic valve Vmax measures 2.46 m/s.  6. The inferior vena cava is dilated in size with <50% respiratory variability, suggesting right atrial pressure of 15 mmHg. Conclusion(s)/Recommendation(s):  Atrial fibrillation throughout the study. FINDINGS  Left Ventricle: Left ventricular ejection fraction, by estimation, is 40 to 45%. The left ventricle has mildly decreased function. The left ventricle demonstrates global hypokinesis. The left ventricular internal cavity size was normal in size. There is  no left ventricular hypertrophy. Left ventricular diastolic function could not be evaluated due to atrial fibrillation. Left ventricular diastolic function could not be evaluated.  LV Wall Scoring: There is global left ventricular hypokinesis, but there appears to be disproportionate  hypokinesis of the inferior wall and inferior septum. Right Ventricle: The right ventricular size is mildly enlarged. No increase in right ventricular wall thickness. Right ventricular systolic function is moderately reduced. There is mildly elevated pulmonary artery systolic pressure. The tricuspid regurgitant velocity is 2.70 m/s, and with an assumed right atrial pressure of 15 mmHg, the estimated right ventricular systolic pressure is 44.2 mmHg. Left Atrium: Left atrial size was moderately dilated. Right Atrium: Right atrial size was normal in size. Pericardium: There is no evidence of pericardial effusion. Mitral Valve: The mitral valve is normal in structure. There is mild thickening of the mitral valve leaflet(s). Mild to moderate mitral annular calcification. Mild mitral valve regurgitation. No evidence of mitral valve stenosis. Tricuspid Valve: The tricuspid valve is normal in structure. Tricuspid valve regurgitation is not demonstrated. No evidence of tricuspid stenosis. Aortic Valve: The aortic valve is tricuspid. There is moderate calcification of the aortic valve. There is moderate thickening of the aortic valve. Aortic valve regurgitation is mild to moderate. Mild to moderate aortic stenosis is present. Aortic valve mean gradient measures 14.5 mmHg. Aortic valve peak gradient measures 24.1 mmHg. Aortic valve area, by VTI  measures 1.19 cm. Pulmonic Valve: The pulmonic valve was normal in structure. Pulmonic valve regurgitation is not visualized. No evidence of pulmonic stenosis. Aorta: The aortic root is normal in size and structure. Venous: The inferior vena cava is dilated in size with less than 50% respiratory variability, suggesting right atrial pressure of 15 mmHg. IAS/Shunts: No atrial level shunt detected by color flow Doppler.  LEFT VENTRICLE PLAX 2D LVIDd:         4.30 cm      Diastology LVIDs:         3.10 cm      LV e' medial:  5.87 cm/s LV PW:         1.20 cm      LV e' lateral: 3.92 cm/s LV IVS:        1.10 cm LVOT diam:     1.86 cm LV SV:         56 LV SV Index:   30 LVOT Area:     2.72 cm  LV Volumes (MOD) LV vol d, MOD A2C: 95.1 ml LV vol d, MOD A4C: 105.0 ml LV vol s, MOD A2C: 55.1 ml LV vol s, MOD A4C: 47.8 ml LV SV MOD A2C:     40.0 ml LV SV MOD A4C:     105.0 ml LV SV MOD BP:      47.6 ml RIGHT VENTRICLE          IVC RV Basal diam:  3.30 cm  IVC diam: 2.10 cm LEFT ATRIUM             Index       RIGHT ATRIUM           Index LA diam:        4.70 cm 2.53 cm/m  RA Area:     16.90 cm LA Vol (A2C):   80.4 ml 43.25 ml/m RA Volume:   46.10 ml  24.80 ml/m LA Vol (A4C):   38.0 ml 20.44 ml/m LA Biplane Vol: 59.9 ml 32.22 ml/m  AORTIC VALVE AV Area (Vmax):    1.37 cm AV Area (Vmean):   1.23 cm AV Area (VTI):     1.19 cm AV Vmax:           245.50 cm/s AV Vmean:  177.000 cm/s AV VTI:            0.470 m AV Peak Grad:      24.1 mmHg AV Mean Grad:      14.5 mmHg LVOT Vmax:         124.00 cm/s LVOT Vmean:        80.200 cm/s LVOT VTI:          0.205 m LVOT/AV VTI ratio: 0.44  AORTA Ao Root diam: 2.30 cm Ao Asc diam:  2.90 cm TRICUSPID VALVE TR Peak grad:   29.2 mmHg TR Vmax:        270.00 cm/s  SHUNTS Systemic VTI:  0.20 m Systemic Diam: 1.86 cm Rachelle Hora Croitoru MD Electronically signed by Thurmon Fair MD Signature Date/Time: 10/08/2020/1:42:32 PM    Final    VAS [place] LOWER EXTREMITY VENOUS (DVT)  Result Date:  10/08/2020  Lower Venous DVT Study Indications: Swelling.  Comparison Study: No prior studies. Performing Technologist: Jean Rosenthal RDMS,RVT  Examination Guidelines: A complete evaluation includes B-mode imaging, spectral Doppler, color Doppler, and power Doppler as needed of all accessible portions of each vessel. Bilateral testing is considered an integral part of a complete examination. Limited examinations for reoccurring indications may be performed as noted. The reflux portion of the exam is performed with the patient in reverse Trendelenburg.  +---------+---------------+---------+-----------+----------+--------------+ RIGHT    CompressibilityPhasicitySpontaneityPropertiesThrombus Aging +---------+---------------+---------+-----------+----------+--------------+ CFV      Full           Yes      Yes                                 +---------+---------------+---------+-----------+----------+--------------+ SFJ      Full                                                        +---------+---------------+---------+-----------+----------+--------------+ FV Prox  Full                                                        +---------+---------------+---------+-----------+----------+--------------+ FV Mid   Full                                                        +---------+---------------+---------+-----------+----------+--------------+ FV DistalFull                                                        +---------+---------------+---------+-----------+----------+--------------+ PFV      Full                                                        +---------+---------------+---------+-----------+----------+--------------+  POP      Full           Yes      Yes                                 +---------+---------------+---------+-----------+----------+--------------+ PTV      Full                                                         +---------+---------------+---------+-----------+----------+--------------+ PERO     Full                                                        +---------+---------------+---------+-----------+----------+--------------+   +---------+---------------+---------+-----------+----------+--------------+ LEFT     CompressibilityPhasicitySpontaneityPropertiesThrombus Aging +---------+---------------+---------+-----------+----------+--------------+ CFV      Full           Yes      Yes                                 +---------+---------------+---------+-----------+----------+--------------+ SFJ      Full                                                        +---------+---------------+---------+-----------+----------+--------------+ FV Prox  Full                                                        +---------+---------------+---------+-----------+----------+--------------+ FV Mid   Full                                                        +---------+---------------+---------+-----------+----------+--------------+ FV DistalFull                                                        +---------+---------------+---------+-----------+----------+--------------+ PFV      Full                                                        +---------+---------------+---------+-----------+----------+--------------+ POP      Full           Yes      Yes                                 +---------+---------------+---------+-----------+----------+--------------+  PTV      Full                                                        +---------+---------------+---------+-----------+----------+--------------+ PERO     Full                                                        +---------+---------------+---------+-----------+----------+--------------+     Summary: RIGHT: - There is no evidence of deep vein thrombosis in the lower extremity.  - No cystic structure found in  the popliteal fossa.  LEFT: - There is no evidence of deep vein thrombosis in the lower extremity.  - No cystic structure found in the popliteal fossa.  *See table(s) above for measurements and observations. Electronically signed by Heath Lark on 10/08/2020 at 6:55:32 PM.    Final     Scheduled Meds: . Melene Muller ON 10/09/2020] digoxin  0.125 mg Oral Daily  . [START ON 10/09/2020] diltiazem  120 mg Oral Q12H  . docusate sodium  100 mg Oral BID  . doxycycline  100 mg Oral Q12H  . DULoxetine  120 mg Oral Daily  . enoxaparin (LOVENOX) injection  80 mg Subcutaneous Q12H  . fluticasone furoate-vilanterol  1 puff Inhalation Daily  . furosemide  40 mg Intravenous Q12H  . [START ON 10/09/2020] influenza vaccine adjuvanted  0.5 mL Intramuscular Tomorrow-1000  . ipratropium  0.5 mg Nebulization TID  . levalbuterol  0.63 mg Nebulization TID  . lidocaine  1 patch Transdermal Q24H  . methylPREDNISolone (SOLU-MEDROL) injection  80 mg Intravenous BID  . nicotine  21 mg Transdermal Daily  . pantoprazole  40 mg Oral Daily  . pravastatin  20 mg Oral Daily  . sodium chloride flush  3 mL Intravenous Q12H  . Warfarin - Pharmacist Dosing Inpatient   Does not apply q1600   Continuous Infusions: . sodium chloride    . amiodarone 30 mg/hr (10/08/20 1918)   PRN Meds: sodium chloride, acetaminophen, metoprolol tartrate, ondansetron (ZOFRAN) IV, sodium chloride flush, zolpidem  Time spent: 35 minutes  Author: Lynden Oxford, MD [facility] 10/08/2020 7:37 PM  To reach On-call, see care teams to locate the attending and reach out via [email or web address]. Between 7PM-7AM, please contact night-coverage If you still have difficulty reaching the attending provider, please page the [facility] (Director on Call) for [facility] on amion for assistance.

## 2020-10-08 NOTE — Plan of Care (Signed)
  Problem: Clinical Measurements: Goal: Respiratory complications will improve Outcome: Progressing  Pt. O2 98% on 5L. Off BiPap at this time.

## 2020-10-08 NOTE — Progress Notes (Signed)
Lower extremity venous bilateral study completed.   Please see CV Proc for preliminary results.   Kevyn Boquet, RDMS, RVT  

## 2020-10-08 NOTE — Progress Notes (Signed)
Pt. HR sustaining in the 140s-170s. Pt. Restless. O2 saturation was down to 70%. Now 90 on 5L [place]. On call for [facility] paged to make aware.

## 2020-10-08 NOTE — Progress Notes (Signed)
ANTICOAGULATION CONSULT NOTE - Follow-up Consult  Pharmacy Consult for Lovenox Bridge and Warfarin Indication: atrial fibrillation  Allergies  Allergen Reactions  . Lithium     Patient became suicidal  . Tegretol [Carbamazepine]     UNK reaction    Patient Measurements: Height: 5\' 5"  (165.1 cm) Weight: 78.4 kg (172 lb 13.5 oz) IBW/kg (Calculated) : 57  Vital Signs: Temp: 98.2 F (36.8 C) (03/25 0751) Temp Source: Oral (03/25 0751) BP: 120/86 (03/25 0844) Pulse Rate: 109 (03/25 0844)  Labs: Recent Labs    10/07/20 1337 10/07/20 1340 10/07/20 1425 10/08/20 0408  HGB 14.0  --   --  13.8  HCT 44.3  --   --  41.7  PLT 421*  --   --  363  LABPROT  --  20.4*  --  16.9*  INR  --  1.8*  --  1.4*  CREATININE 1.00  --   --  1.11*  TROPONINIHS 28*  --  26*  --     Estimated Creatinine Clearance: 46.7 mL/min (A) (by C-G formula based on SCr of 1.11 mg/dL (H)).   Medical History: Past Medical History:  Diagnosis Date  . Anxiety   . Bipolar 1 disorder (HCC)   . Bowel obstruction (HCC)   . Cataract   . Chronic atrial fibrillation (HCC) 09/2014   On chronic anticoagulation with coumadin.  She has failed several DCCVs in the past.  . COPD (chronic obstructive pulmonary disease) (HCC)   . Dyslipidemia, goal LDL below 70 12/25/2015  . Encounter for tobacco use cessation counseling 12/04/2017  . Glaucoma   . Hx of migraine headaches   . Memory loss   . Mitral regurgitation    mild to moderate on echo 09/2014  . Old MI (myocardial infarction) 09/2014   cath with normal coronary arteries and EF 50-55%  . Osteoarthritis   . Pulmonary HTN (HCC) 09/2014   Mild with PASP 10/2014 at cath  . RBBB   . Rheumatoid arthritis (HCC)   . Tobacco abuse    Assessment: 66 YOF admitted with afib with RVR on Coumadin PTA for afib. Pharmacy has been consulted to dose warfarin.   Home warfarin dose 2.5 mg daily, except for 5 mg on Tues/Thurs. INR on admit 1.8. LFTs WNL.  3/25- INR went down  to 1.4, dose of warfarin 5 mg was not given on 3/24. Patient was transferred from the ED to the floor around the time the dose was due (1600). Hgb and plts stable and no s/s of bleeding noted.   MD consulted pharmacy for Lovenox bridge while INR is in subtherapeutic range. CrCl >30 (46.7), so 1mg /kg q12h was used for dosing.    Goal of Therapy:  INR 2-3 Monitor platelets by anticoagulation protocol: Yes   Plan:  Lovenox 80 mg subq q12h until INR is therapeutic  Warfarin 5 mg x1 3/25 1015am Daily PT/INR Monitor CBC, s/s bleeding  PharmD Candidate 10/08/2020 9:31 AM  Please check [email or web address] for unit specific pharmacy phone numbers.

## 2020-10-08 NOTE — Progress Notes (Signed)
Just before shift change RN in patient room. Patient stated to RN "Please give me something to end this and I will not tell anyone."   During shift report night shift RN and two day shift present.  Patient stated that she "will not hurt herself."  Primary day shift RN will notify MD.

## 2020-10-09 DIAGNOSIS — F331 Major depressive disorder, recurrent, moderate: Secondary | ICD-10-CM | POA: Diagnosis not present

## 2020-10-09 DIAGNOSIS — I5041 Acute combined systolic (congestive) and diastolic (congestive) heart failure: Secondary | ICD-10-CM

## 2020-10-09 DIAGNOSIS — I4821 Permanent atrial fibrillation: Secondary | ICD-10-CM

## 2020-10-09 LAB — BASIC METABOLIC PANEL
Anion gap: 10 (ref 5–15)
BUN: 19 mg/dL (ref 8–23)
CO2: 28 mmol/L (ref 22–32)
Calcium: 9.2 mg/dL (ref 8.9–10.3)
Chloride: 92 mmol/L — ABNORMAL LOW (ref 98–111)
Creatinine, Ser: 1.03 mg/dL — ABNORMAL HIGH (ref 0.44–1.00)
GFR, Estimated: 57 mL/min — ABNORMAL LOW (ref >60)
Glucose, Bld: 192 mg/dL — ABNORMAL HIGH (ref 70–99)
Potassium: 4.7 mmol/L (ref 3.5–5.1)
Sodium: 130 mmol/L — ABNORMAL LOW (ref 135–145)

## 2020-10-09 LAB — PROTIME-INR
INR: 1.9 — ABNORMAL HIGH (ref 0.8–1.2)
Prothrombin Time: 21.1 seconds — ABNORMAL HIGH (ref 11.4–15.2)

## 2020-10-09 MED ORDER — WARFARIN SODIUM 2.5 MG PO TABS: 2.5000 mg | ORAL_TABLET | Freq: Once | ORAL | Status: DC

## 2020-10-09 MED ORDER — QUETIAPINE FUMARATE 25 MG PO TABS
25.0000 mg | ORAL_TABLET | Freq: Every day | ORAL | Status: DC
  Administered 2020-10-09 – 2020-10-12 (×5): 25 mg via ORAL
  Filled 2020-10-09 (×6): qty 1

## 2020-10-09 MED ORDER — DILTIAZEM HCL 30 MG PO TABS
30.0000 mg | ORAL_TABLET | Freq: Four times a day (QID) | ORAL | Status: DC
  Administered 2020-10-09 – 2020-10-10 (×4): 30 mg via ORAL
  Filled 2020-10-09 (×4): qty 1

## 2020-10-09 MED ORDER — APIXABAN 5 MG PO TABS
5.0000 mg | ORAL_TABLET | Freq: Two times a day (BID) | ORAL | Status: DC
  Administered 2020-10-09 – 2020-10-13 (×9): 5 mg via ORAL
  Filled 2020-10-09 (×9): qty 1

## 2020-10-09 NOTE — Progress Notes (Signed)
   10/09/20 0421  Assess: MEWS Score  Level of Consciousness Alert  Assess: MEWS Score  MEWS Temp 0  MEWS Systolic 0  MEWS Pulse 2  MEWS RR 0  MEWS LOC 0  MEWS Score 2  MEWS Score Color Yellow  Assess: if the MEWS score is Yellow or Red  Were vital signs taken at a resting state? Yes  Focused Assessment No change from prior assessment  Early Detection of Sepsis Score *See Row Information* Low  MEWS guidelines implemented *See Row Information* No, previously yellow, continue vital signs every 4 hours  Notify: Charge Nurse/RN  Name of Charge Nurse/RN Notified Lyn Rn  Date Charge Nurse/RN Notified 10/09/20  Time Charge Nurse/RN Notified 0425  Document  Patient Outcome Not stable and remains on department  Progress note created (see row info) Yes

## 2020-10-09 NOTE — Discharge Instructions (Signed)

## 2020-10-09 NOTE — Plan of Care (Signed)
°  Problem: Education: °Goal: Knowledge of General Education information will improve °Description: Including pain rating scale, medication(s)/side effects and non-pharmacologic comfort measures °Outcome: Progressing °  °Problem: Health Behavior/Discharge Planning: °Goal: Ability to manage health-related needs will improve °Outcome: Progressing °  °Problem: Clinical Measurements: °Goal: Cardiovascular complication will be avoided °Outcome: Progressing °  °

## 2020-10-09 NOTE — Progress Notes (Signed)
Patient complains of headache unrelieved by PRN medication, MD notified.

## 2020-10-09 NOTE — Consult Note (Addendum)
Cardiology Consultation:   Patient ID: Theresa Mendoza MRN: [id number]; DOB: 01/12/1946  Admit date: 10/07/2020 Date of Consult: 10/09/2020  PCP:  Aviva Kluver   [facility]  Cardiologist:  Armanda Magic, MD  Advanced Practice Provider:  No care team member to display Electrophysiologist:  None    Patient Profile:   Theresa Mendoza is a 75 y.o. female with a hx of tobacco use, remote MI, permanent Afib (on coumadin) has failed multiple cardioversions, COPD, HLD, Bipolar disorder, mild to moderate MR, pulmonary HTN, and RA  who is being seen today for the evaluation of CHF and Afib RVR at the request of Dr. Allena Katz.  History of Present Illness:   Theresa Mendoza is a 75 yo female with PMH noted above. She has been followed by Dr. Mayford Knife in the past. Initially dx with Afib back in 2016. Has failed multiple attempts at cardioversion. Maintained on Cardizem/atenolol/digoxin for rate control and coumadin. Echo from 01/2016 with normal EF of 60-65% with no rWMA noted, normal LA size, trivial MR. She was last seen in the office 11/2017 with Dr. Mayford Knife. At this visit her afib was rate controlled in the 60s. Continued on digoxin 0.25mg  daily, Cardizem CD  daily and atenolol  daily. Encouraged to cut back on her smoking. Digixon level checked at this visit was noted elevated, instructed to cut back to 0.125mg  daily. She was lost to follow up in the midst of the pandemic.  Presented to the ED from PCP office on 3/24 with progressive dyspnea, LE edema and Afib RVR. Has not been seen in her PCP in over a year. She does not handle her medications, this is done by her son. Says her coumadin levels are checked at PCP office but does not remember the last time she went to have labs checked.   In the ED her labs showed stable electrolytes, Cr 1.0, hsTn 28>>26, WBC 13.1, BNP 702, INR 1.8>>1.4, TSH 1.5. CXR with small right pleural effusion. EKG afib RVR 129bpm, nonspecific changes.  Given IV lasix in the ED and admitted to [facility] for further management. Her home Diltiazem was stopped. Resumed on digoxin on admission (appears had not been taking since 01/2020). Atenolol was increased from 25-->50mg  daily. Started on IV lasix  BID.   Echo this admission showed decline in EF to 40-45% with global hypokinesis, moderately dilated LA, mild to moderate AS. She continued to have elevated HRs and placed on IV amiodarone. Bridging with lovenox with subtherapeutic INR. Cardiology consulted in regards to management of her Afib and CHF.    Past Medical History:  Diagnosis Date  . Anxiety   . Bipolar 1 disorder (HCC)   . Bowel obstruction (HCC)   . Cataract   . Chronic atrial fibrillation (HCC) 09/2014   On chronic anticoagulation with coumadin.  She has failed several DCCVs in the past.  . COPD (chronic obstructive pulmonary disease) (HCC)   . Dyslipidemia, goal LDL below 70 12/25/2015  . Encounter for tobacco use cessation counseling 12/04/2017  . Glaucoma   . Hx of migraine headaches   . Memory loss   . Mitral regurgitation    mild to moderate on echo 09/2014  . Old MI (myocardial infarction) 09/2014   cath with normal coronary arteries and EF 50-55%  . Osteoarthritis   . Pulmonary HTN (HCC) 09/2014   Mild with PASP at cath  . RBBB   . Rheumatoid arthritis (HCC)   . Tobacco abuse  Past Surgical History:  Procedure Laterality Date  . ADENOIDECTOMY    . BACK SURGERY    . bowel obstruction    . CARDIAC CATHETERIZATION  09/2014   normal coronary arteries with mild pulmonary HTN, low normal LVF  . CHOLECYSTECTOMY    . NOSE SURGERY    . TONSILLECTOMY    . TRANSESOPHAGEAL ECHOCARDIOGRAM WITH CARDIOVERSION  09/2014  . VAGINAL HYSTERECTOMY     with BSO     Home Medications:  Prior to Admission medications   Medication Sig Start Date End Date Taking? Authorizing Provider  albuterol (PROVENTIL HFA;VENTOLIN HFA) 108 (90 Base) MCG/ACT inhaler Inhale 2 puffs into the  lungs every 6 (six) hours as needed for wheezing or shortness of breath. 08/24/16  Yes [place], [place] A, MD  atenolol (TENORMIN) 25 MG tablet Take 25 mg by mouth daily. 11/28/15  Yes [provider]  diltiazem (CARDIZEM CD) 240 MG 24 hr capsule Take 240 mg by mouth daily. 11/01/15  Yes [provider]  DULoxetine (CYMBALTA) 60 MG capsule TAKE 2 CAPSULES BY MOUTH EVERY DAY Patient taking differently: Take 120 mg by mouth daily. 10/09/19  Yes Plovsky, Earvin Hansen, MD  furosemide (LASIX) 40 MG tablet Take 40 mg by mouth 2 (two) times daily.  11/23/15  Yes [provider]  omeprazole (PRILOSEC) 40 MG capsule Take 40 mg by mouth in the morning. Before breakfast 09/07/20  Yes [provider]  pravastatin (PRAVACHOL) 20 MG tablet Take 20 mg by mouth daily. 09/07/20  Yes [provider]  warfarin (COUMADIN) 5 MG tablet Take 2.5-5 mg by mouth See admin instructions. Taking 2.5mg  on Mon, Wed, Friday, Sat, Sun. Taking 5 mg on Tues, Thursday 12/10/15  Yes [provider]  digoxin (LANOXIN) 0.125 MG tablet Take 1 tablet (0.125 mg total) by mouth daily. Please make overdue appt with Dr. Mayford Knife before anymore refills. 2nd attempt 10/07/20   Quintella Reichert, MD  doxepin (SINEQUAN) 50 MG capsule TAKE 1 CAPSULE (50 MG TOTAL) BY MOUTH AT BEDTIME. Patient not taking: No sig reported 08/13/19   Archer Asa, MD    Inpatient Medications: Scheduled Meds: . apixaban  5 mg Oral BID  . digoxin  0.125 mg Oral Daily  . diltiazem  30 mg Oral Q6H  . docusate sodium  100 mg Oral BID  . doxycycline  100 mg Oral Q12H  . DULoxetine  120 mg Oral Daily  . fluticasone furoate-vilanterol  1 puff Inhalation Daily  . furosemide  40 mg Intravenous Q12H  . ipratropium  0.5 mg Nebulization TID  . levalbuterol  0.63 mg Nebulization TID  . lidocaine  1 patch Transdermal Q24H  . methylPREDNISolone (SOLU-MEDROL) injection  80 mg Intravenous BID  . metoprolol tartrate  25 mg Oral BID  .  nicotine  21 mg Transdermal Daily  . pantoprazole  40 mg Oral Daily  . pravastatin  20 mg Oral Daily  . sodium chloride flush  3 mL Intravenous Q12H   Continuous Infusions: . sodium chloride     PRN Meds: sodium chloride, acetaminophen, metoprolol tartrate, ondansetron (ZOFRAN) IV, sodium chloride flush, zolpidem  Allergies:    Allergies  Allergen Reactions  . Lithium     Patient became suicidal  . Tegretol [Carbamazepine]     UNK reaction    Social History:   Social History   Socioeconomic History  . Marital status: Widowed    Spouse name: Not on file  . Number of children: Not on file  .  Years of education: Not on file  . Highest education level: Not on file  Occupational History  . Not on file  Tobacco Use  . Smoking status: Current Every Day Smoker    Packs/day: 2.00    Years: 55.00    Pack years: 110.00    Types: Cigarettes  . Smokeless tobacco: Never Used  . Tobacco comment: Has cut back to 1 pk a day from 3 a day.  Substance and Sexual Activity  . Alcohol use: No    Alcohol/week: 0.0 standard drinks  . Drug use: No  . Sexual activity: Never  Other Topics Concern  . Not on file  Social History Narrative   ** Merged History Encounter **       Lives with son and daughter in law in a 2 story home.  Has 1 son.  Retired from [employer].  Education: high school.  Widowed.   Social Determinants of Health   Financial Resource Strain: Not on file  Food Insecurity: Not on file  Transportation Needs: Not on file  Physical Activity: Not on file  Stress: Not on file  Social Connections: Not on file  Intimate Partner Violence: Not on file    Family History:    Family History  Problem Relation Age of Onset  . Tuberculosis Father   . Depression Father   . Alzheimer's disease Mother   . Lung cancer Mother      ROS:  Please see the history of present illness.   All other ROS reviewed and negative.     Physical Exam/Data:   Vitals:   10/09/20 0811 10/09/20  0837 10/09/20 0839 10/09/20 1015  BP: 123/71   (!) 121/47  Pulse: (!) 114   (!) 137  Resp: 18   (!) 24  Temp: 98.5 F (36.9 C)     TempSrc: Oral     SpO2: 98% 99% 100% 90%  Weight:      Height:        Intake/Output Summary (Last 24 hours) at 10/09/2020 1124 Last data filed at 10/09/2020 1000 Gross per 24 hour  Intake 1558 ml  Output 2400 ml  Net -842 ml   Last 3 Weights 10/09/2020 10/08/2020 10/07/2020  Weight (lbs) 173 lb 8 oz 172 lb 13.5 oz 173 lb 11.6 oz  Weight (kg) 78.7 kg 78.4 kg 78.8 kg  Some encounter information is confidential and restricted. Go to Review Flowsheets activity to see all data.     Body mass index is 28.87 kg/m.  General:  Well nourished, well developed, in no acute distress HEENT: normal Lymph: no adenopathy Neck: no JVD  Endocrine:  No thryomegaly Vascular: No carotid bruits Cardiac:  normal S1, S2; Irreg Irreg; + systolic murmur  Lungs:  Expiratory wheezing, crackles in bases Abd: soft, nontender, no hepatomegaly  Ext: 2+ LE edema, redness to bilateral ankles Musculoskeletal:  No deformities, BUE and BLE strength normal and equal Skin: warm and dry  Neuro:  CNs 2-12 intact, no focal abnormalities noted Psych:  Normal affect   EKG:  The EKG was personally reviewed and demonstrates:  Afib RVR, RBBB, LAFB Telemetry:  Telemetry was personally reviewed and demonstrates:  Afib RVR rates 110s-140s  Relevant CV Studies:  Echo: 10/08/20  IMPRESSIONS    1. Left ventricular ejection fraction, by estimation, is 40 to 45%. The  left ventricle has mildly decreased function. The left ventricle  demonstrates global hypokinesis. Left ventricular diastolic function could  not be evaluated. There is global left  ventricular hypokinesis, but there appears to be disproportionate  hypokinesis of the inferior wall and inferior septum.  2. Right ventricular systolic function is moderately reduced. The right  ventricular size is mildly enlarged. There is mildly  elevated pulmonary  artery systolic pressure.  3. Left atrial size was moderately dilated.  4. The mitral valve is normal in structure. Mild mitral valve  regurgitation. No evidence of mitral stenosis.  5. The aortic valve is tricuspid. There is moderate calcification of the  aortic valve. There is moderate thickening of the aortic valve. Aortic  valve regurgitation is mild to moderate. Mild to moderate aortic valve  stenosis. Aortic valve mean gradient  measures 14.5 mmHg. Aortic valve Vmax measures 2.46 m/s.  6. The inferior vena cava is dilated in size with <50% respiratory  variability, suggesting right atrial pressure of 15 mmHg.   Conclusion(s)/Recommendation(s): Atrial fibrillation throughout the study.   Laboratory Data:  High Sensitivity Troponin:   Recent Labs  Lab 10/07/20 1337 10/07/20 1425  TROPONINIHS 28* 26*     Chemistry Recent Labs  Lab 10/07/20 1337 10/08/20 0408 10/09/20 0225  NA 138 136 130*  K 4.2 3.4* 4.7  CL 97* 93* 92*  CO2 31 33* 28  GLUCOSE 110* 131* 192*  BUN 29* 22 19  CREATININE 1.00 1.11* 1.03*  CALCIUM 9.3 9.2 9.2  GFRNONAA 59* 52* 57*  ANIONGAP 10 10 10     Recent Labs  Lab 10/07/20 1340  PROT 6.5  ALBUMIN 3.6  AST 17  ALT 21  ALKPHOS 65  BILITOT 0.7   Hematology Recent Labs  Lab 10/07/20 1337 10/08/20 0408  WBC 13.1* 12.8*  RBC 4.86 4.75  HGB 14.0 13.8  HCT 44.3 41.7  MCV 91.2 87.8  MCH 28.8 29.1  MCHC 31.6 33.1  RDW 14.8 15.0  PLT 421* 363   BNP Recent Labs  Lab 10/07/20 1338  BNP 702.4*    DDimer No results for input(s): DDIMER in the last 168 hours.   Radiology/Studies:  DG Chest 2 View  Result Date: 10/07/2020 CLINICAL DATA:  Shortness of breath. EXAM: CHEST - 2 VIEW COMPARISON:  08/24/2016 FINDINGS: Two-view exam shows right base airspace disease, suspicious for pneumonia. There is a small right pleural effusion. Left lung clear. Interstitial markings are diffusely coarsened with chronic features.  Cardiopericardial silhouette is at upper limits of normal for size. The visualized bony structures of the thorax show no acute abnormality. IMPRESSION: Right base airspace disease suspicious for pneumonia with small right pleural effusion. Follow-up imaging recommended to ensure complete resolution. Electronically Signed   By: 10/22/2016 M.D.   On: 10/07/2020 13:36   ECHOCARDIOGRAM COMPLETE  Result Date: 10/08/2020    ECHOCARDIOGRAM REPORT   Patient Name:   Theresa Mendoza Date of Exam: 10/08/2020 Medical Rec #:  10/10/2020        Height:       65.0 in Accession #:    [id number]       Weight:       172.8 lb Date of Birth:  Apr 03, 1946       BSA:          1.859 m Patient Age:    74 years         BP:           113/79 mmHg Patient Gender: F                HR:           74  bpm. Exam Location:  Inpatient Procedure: 2D Echo, Cardiac Doppler and Color Doppler Indications:    Stroke  History:        Patient has no prior history of Echocardiogram examinations.  Sonographer:    Roosvelt Maser RDCS Referring Phys: [id number] Emeline General IMPRESSIONS  1. Left ventricular ejection fraction, by estimation, is 40 to 45%. The left ventricle has mildly decreased function. The left ventricle demonstrates global hypokinesis. Left ventricular diastolic function could not be evaluated. There is global left ventricular hypokinesis, but there appears to be disproportionate hypokinesis of the inferior wall and inferior septum.  2. Right ventricular systolic function is moderately reduced. The right ventricular size is mildly enlarged. There is mildly elevated pulmonary artery systolic pressure.  3. Left atrial size was moderately dilated.  4. The mitral valve is normal in structure. Mild mitral valve regurgitation. No evidence of mitral stenosis.  5. The aortic valve is tricuspid. There is moderate calcification of the aortic valve. There is moderate thickening of the aortic valve. Aortic valve regurgitation is mild to moderate. Mild to  moderate aortic valve stenosis. Aortic valve mean gradient measures 14.5 mmHg. Aortic valve Vmax measures 2.46 m/s.  6. The inferior vena cava is dilated in size with <50% respiratory variability, suggesting right atrial pressure of 15 mmHg. Conclusion(s)/Recommendation(s): Atrial fibrillation throughout the study. FINDINGS  Left Ventricle: Left ventricular ejection fraction, by estimation, is 40 to 45%. The left ventricle has mildly decreased function. The left ventricle demonstrates global hypokinesis. The left ventricular internal cavity size was normal in size. There is  no left ventricular hypertrophy. Left ventricular diastolic function could not be evaluated due to atrial fibrillation. Left ventricular diastolic function could not be evaluated.  LV Wall Scoring: There is global left ventricular hypokinesis, but there appears to be disproportionate hypokinesis of the inferior wall and inferior septum. Right Ventricle: The right ventricular size is mildly enlarged. No increase in right ventricular wall thickness. Right ventricular systolic function is moderately reduced. There is mildly elevated pulmonary artery systolic pressure. The tricuspid regurgitant velocity is 2.70 m/s, and with an assumed right atrial pressure of 15 mmHg, the estimated right ventricular systolic pressure is 44.2 mmHg. Left Atrium: Left atrial size was moderately dilated. Right Atrium: Right atrial size was normal in size. Pericardium: There is no evidence of pericardial effusion. Mitral Valve: The mitral valve is normal in structure. There is mild thickening of the mitral valve leaflet(s). Mild to moderate mitral annular calcification. Mild mitral valve regurgitation. No evidence of mitral valve stenosis. Tricuspid Valve: The tricuspid valve is normal in structure. Tricuspid valve regurgitation is not demonstrated. No evidence of tricuspid stenosis. Aortic Valve: The aortic valve is tricuspid. There is moderate calcification of the  aortic valve. There is moderate thickening of the aortic valve. Aortic valve regurgitation is mild to moderate. Mild to moderate aortic stenosis is present. Aortic valve mean gradient measures 14.5 mmHg. Aortic valve peak gradient measures 24.1 mmHg. Aortic valve area, by VTI measures 1.19 cm. Pulmonic Valve: The pulmonic valve was normal in structure. Pulmonic valve regurgitation is not visualized. No evidence of pulmonic stenosis. Aorta: The aortic root is normal in size and structure. Venous: The inferior vena cava is dilated in size with less than 50% respiratory variability, suggesting right atrial pressure of 15 mmHg. IAS/Shunts: No atrial level shunt detected by color flow Doppler.  LEFT VENTRICLE PLAX 2D LVIDd:         4.30 cm      Diastology LVIDs:  3.10 cm      LV e' medial:  5.87 cm/s LV PW:         1.20 cm      LV e' lateral: 3.92 cm/s LV IVS:        1.10 cm LVOT diam:     1.86 cm LV SV:         56 LV SV Index:   30 LVOT Area:     2.72 cm  LV Volumes (MOD) LV vol d, MOD A2C: 95.1 ml LV vol d, MOD A4C: 105.0 ml LV vol s, MOD A2C: 55.1 ml LV vol s, MOD A4C: 47.8 ml LV SV MOD A2C:     40.0 ml LV SV MOD A4C:     105.0 ml LV SV MOD BP:      47.6 ml RIGHT VENTRICLE          IVC RV Basal diam:  3.30 cm  IVC diam: 2.10 cm LEFT ATRIUM             Index       RIGHT ATRIUM           Index LA diam:        4.70 cm 2.53 cm/m  RA Area:     16.90 cm LA Vol (A2C):   80.4 ml 43.25 ml/m RA Volume:   46.10 ml  24.80 ml/m LA Vol (A4C):   38.0 ml 20.44 ml/m LA Biplane Vol: 59.9 ml 32.22 ml/m  AORTIC VALVE AV Area (Vmax):    1.37 cm AV Area (Vmean):   1.23 cm AV Area (VTI):     1.19 cm AV Vmax:           245.50 cm/s AV Vmean:          177.000 cm/s AV VTI:            0.470 m AV Peak Grad:      24.1 mmHg AV Mean Grad:      14.5 mmHg LVOT Vmax:         124.00 cm/s LVOT Vmean:        80.200 cm/s LVOT VTI:          0.205 m LVOT/AV VTI ratio: 0.44  AORTA Ao Root diam: 2.30 cm Ao Asc diam:  2.90 cm TRICUSPID VALVE  TR Peak grad:   29.2 mmHg TR Vmax:        270.00 cm/s  SHUNTS Systemic VTI:  0.20 m Systemic Diam: 1.86 cm Rachelle Hora Croitoru MD Electronically signed by Thurmon Fair MD Signature Date/Time: 10/08/2020/1:42:32 PM    Final    VAS [place] LOWER EXTREMITY VENOUS (DVT)  Result Date: 10/08/2020  Lower Venous DVT Study Indications: Swelling.  Comparison Study: No prior studies. Performing Technologist: Jean Rosenthal RDMS,RVT  Examination Guidelines: A complete evaluation includes B-mode imaging, spectral Doppler, color Doppler, and power Doppler as needed of all accessible portions of each vessel. Bilateral testing is considered an integral part of a complete examination. Limited examinations for reoccurring indications may be performed as noted. The reflux portion of the exam is performed with the patient in reverse Trendelenburg.  +---------+---------------+---------+-----------+----------+--------------+ RIGHT    CompressibilityPhasicitySpontaneityPropertiesThrombus Aging +---------+---------------+---------+-----------+----------+--------------+ CFV      Full           Yes      Yes                                 +---------+---------------+---------+-----------+----------+--------------+  SFJ      Full                                                        +---------+---------------+---------+-----------+----------+--------------+ FV Prox  Full                                                        +---------+---------------+---------+-----------+----------+--------------+ FV Mid   Full                                                        +---------+---------------+---------+-----------+----------+--------------+ FV DistalFull                                                        +---------+---------------+---------+-----------+----------+--------------+ PFV      Full                                                         +---------+---------------+---------+-----------+----------+--------------+ POP      Full           Yes      Yes                                 +---------+---------------+---------+-----------+----------+--------------+ PTV      Full                                                        +---------+---------------+---------+-----------+----------+--------------+ PERO     Full                                                        +---------+---------------+---------+-----------+----------+--------------+   +---------+---------------+---------+-----------+----------+--------------+ LEFT     CompressibilityPhasicitySpontaneityPropertiesThrombus Aging +---------+---------------+---------+-----------+----------+--------------+ CFV      Full           Yes      Yes                                 +---------+---------------+---------+-----------+----------+--------------+ SFJ      Full                                                        +---------+---------------+---------+-----------+----------+--------------+  FV Prox  Full                                                        +---------+---------------+---------+-----------+----------+--------------+ FV Mid   Full                                                        +---------+---------------+---------+-----------+----------+--------------+ FV DistalFull                                                        +---------+---------------+---------+-----------+----------+--------------+ PFV      Full                                                        +---------+---------------+---------+-----------+----------+--------------+ POP      Full           Yes      Yes                                 +---------+---------------+---------+-----------+----------+--------------+ PTV      Full                                                         +---------+---------------+---------+-----------+----------+--------------+ PERO     Full                                                        +---------+---------------+---------+-----------+----------+--------------+     Summary: RIGHT: - There is no evidence of deep vein thrombosis in the lower extremity.  - No cystic structure found in the popliteal fossa.  LEFT: - There is no evidence of deep vein thrombosis in the lower extremity.  - No cystic structure found in the popliteal fossa.  *See table(s) above for measurements and observations. Electronically signed by Heath Lark on 10/08/2020 at 6:55:32 PM.    Final      Assessment and Plan:   Theresa Mendoza is a 75 y.o. female with a hx of tobacco use, remote MI, permanent Afib (on coumadin) has failed multiple cardioversions, COPD, HLD, Bipolar disorder, mild to moderate MR, pulmonary HTN, and RA  who is being seen today for the evaluation of CHF and Afib RVR at the request of Dr. Allena Katz.  1. Atrial fibrillation with RVR: has hx of permanent afib. Previously managed with Cardizem, atenolol and dig. Coumadin for anticoagulation. On admission found to be in afib RVR with rates in the  140-150s.  -- initially stop cardizem, and increased BB with dig resumed -- rates remained elevated and started on IV amiodarone -- HR remains uncontrolled in the setting of acute CHF -- would stop IV amiodarone given underlying COPD and use of digoxin -- will resume Diltiazem  q6hr and continue Digoxin load along with metoprolol -- seems would benefit to transition to DOAC, will switch to Eliquis  BID  2. Acute combined CHF: EF declined to 40-45% this admission. Suspect this could be tachy-mediated given uncontrolled rates on admission and several weeks of symptoms. BNP >700 on admission.  -- being treated with IV lasix, good UOP thus far with 1.9L yesterday  3. COPD exacerbation: notable wheezing on exam. Uses continuous O2 at home as well as  nebs.   4. Mild to moderate AS: noted on echo this admission.   5. Tobacco use: cessation advised  Risk Assessment/Risk Scores:   New York Heart Association (NYHA) Functional Class NYHA Class II  CHA2DS2-VASc Score = 5  This indicates a 7.2% annual risk of stroke. The patient's score is based upon: CHF History: Yes HTN History: Yes Diabetes History: No Stroke History: No Vascular Disease History: Yes Age Score: 1 Gender Score: 1   For questions or updates, please contact [facility] Please consult [email or web address] for contact info under  Signed, Laverda Page, NP  10/09/2020 11:24 AM As above, patient seen and examined.  Briefly she is a 75 year old female with past medical history of tobacco abuse, permanent atrial fibrillation, COPD on home oxygen, bipolar disorder admitted with acute combined systolic/diastolic congestive heart failure and atrial fibrillation with rapid ventricular response.  Patient described 2 to 3-week history of increased dyspnea on exertion, orthopnea and bilateral lower extremity edema.  No exertional chest pain, palpitations or syncope.  She has been diuresed in atrial fibrillation rate noted to be elevated.  Cardiology now asked to evaluate. Chest x-ray showed right basilar airspace disease and effusion.  Sodium 130 with potassium 4.7.  Creatinine 1.03.  BNP 702.  Echocardiogram shows ejection fraction 40 to 45%, moderate left atrial enlargement, moderate RV dysfunction with mild right ventricular enlargement, mild mitral regurgitation, mild to moderate aortic insufficiency and mild to moderate aortic stenosis with mean gradient 14 mmHg. Electrocardiogram shows atrial fibrillation with rapid ventricular spots, left anterior fascicular block, right bundle branch block, left ventricular hypertrophy.  1 acute combined systolic/diastolic congestive heart failure-patient has improved since admission but remains volume overloaded.  Continue Lasix 40 mg twice  daily and follow renal function.  2 permanent atrial fibrillation-heart rate remains elevated.  Amiodarone was added last evening but I do not think this is a good long-term option given baseline lung disease.  Will discontinue.  I would also be hesitant to advance beta-blockade in the setting of severe COPD.  We will continue metoprolol 25 mg twice daily.  Continue digoxin at present dose.  Add Cardizem 30 mg every 6 hours and increase as needed and as tolerated by blood pressure.  Discontinue Coumadin.  We will instead treat with apixaban 5 mg twice daily.  3 cardiomyopathy with mild LV dysfunction-etiology unclear but may be tachycardia mediated.  Will adjust rate controlling medications with plans to repeat echocardiogram in approximately 3 to 6 months to reassess.  4 mild to moderate aortic stenosis/mild to moderate aortic insufficiency-she will need follow-up echoes as an outpatient.  5 severe COPD-pulmonary toilet per primary care.  6 tobacco abuse-patient counseled on discontinuing.  Olga Millers, MD

## 2020-10-09 NOTE — Progress Notes (Signed)
Orthopedic Tech Progress Note Patient Details:  Theresa Mendoza [birth year] [id number]  Ortho Devices Type of Ortho Device: Roland Rack boot Ortho Device/Splint Location: Bilateral Lower Extremity Ortho Device/Splint Interventions: Ordered,Application   Post Interventions Patient Tolerated: Well Instructions Provided: Care of device,Poper ambulation with device   Gerald Stabs 10/09/2020, 6:37 PM

## 2020-10-09 NOTE — Progress Notes (Signed)
[facility] Progress Note  Patient: Theresa Mendoza    OVZ:[id number]  DOA: 10/07/2020     Date of Service: the patient was seen and examined on 10/09/2020  Brief hospital course: Past medical history of PAF, on Coumadin, COPD, chronic hypoxic respiratory failure, smoker.  Presents with complaints of cough and shortness of breath.  Found to have COPD exacerbation as well as volume overload.  Needing BiPAP on admission. Currently plan is continue respiratory support, IV diuresis and treat COPD exacerbation.  Assessment and Plan: 1.  Acute on chronic combined systolic and diastolic CHF Continue with IV diuresis. Echocardiogram shows EF of 45%. Previous cardiogram in 2017 shows preserved EF. Likely current tachycardia induced  patient denies any complaints of chest pain chest tightness or ACS. Continue to monitor for now. Appreciate cardiology consult. Continue BiPAP as needed.  2.  Acute COPD exacerbation Right basilar pneumonia Continue DuoNebs. Continue steroids. Change from prednisone to Solu-Medrol. Continue with antibiotics.  3.  Chronic A. fib with RVR Patient is supposed to be on Cardizem, atenolol as well as digoxin. She ran out of her digoxin. Currently she is on amiodarone, digoxin as well as beta-blocker. In the setting of low EF holding Cardizem. Cardiology recommending to switch to Eliquis. Cardiology added Cardizem as well.  4.  Cigarette smoker. Nicotine patch.  5.  Depression. Patient on 3/24 reported that she would like this to end.  At the time of my evaluation denies any complaints of suicidal ideation or homicidal ideation.  She has no plans to kill herself. Psychiatry consulted, able to meet initiate Seroquel.  6.  Goals of care conversation. Extensive discussion with the patient regarding her goals of care.  Currently does not want to be resuscitated or intubated.  Would be okay to use BiPAP.  Diet: Cardiac diet DVT Prophylaxis: Therapeutic  Anticoagulation with Warfarin and Lovenox   apixaban (ELIQUIS) tablet 5 mg    Advance goals of care discussion: Full code  Family Communication: no family was present at bedside, at the time of interview.   Disposition:  Status is: Inpatient  Remains inpatient appropriate because:IV treatments appropriate due to intensity of illness or inability to take PO   Dispo: The patient is from: Home              Anticipated d/c is to: Home              Patient currently is not medically stable to d/c.   Difficult to place patient   Subjective: No nausea no vomiting.  Continues to have shortness of breath.  Continues to have cough.  No headache.  Reports fatigue and tiredness.  Physical Exam:  General: Appear in mild distress, no Rash; Oral Mucosa Clear, moist. no Abnormal Neck Mass Or lumps, Conjunctiva normal  Cardiovascular: S1 and S2 Present, no Murmur, Respiratory: good respiratory effort, Bilateral Air entry present and CTA, no Crackles, bilateral wheezes Abdomen: Bowel Sound present, Soft and no tenderness Extremities: Bilaterally pedal edema Neurology: alert and oriented to time, place, and person affect appropriate. no new focal deficit Gait not checked due to patient safety concerns  Vitals:   10/09/20 1445 10/09/20 1536 10/09/20 1600 10/09/20 1925  BP:  (!) 137/91 (!) 123/57 122/67  Pulse:  82 (!) 114 72  Resp:  20 (!) 29 20  Temp:  98 F (36.7 C)  98.5 F (36.9 C)  TempSrc:  Oral  Oral  SpO2: 100% 98% 94% 98%  Weight:  Height:        Intake/Output Summary (Last 24 hours) at 10/09/2020 2000 Last data filed at 10/09/2020 1831 Gross per 24 hour  Intake 2260 ml  Output 3300 ml  Net -1040 ml   Filed Weights   10/07/20 1725 10/08/20 0100 10/09/20 0231  Weight: 78.8 kg 78.4 kg 78.7 kg    Data Reviewed: I have personally reviewed and interpreted daily labs, tele strips, imaging. I reviewed all nursing notes, pharmacy notes, vitals, pertinent old records I have  discussed plan of care as described above with RN and patient/family.  CBC: Recent Labs  Lab 10/07/20 1337 10/08/20 0408  WBC 13.1* 12.8*  HGB 14.0 13.8  HCT 44.3 41.7  MCV 91.2 87.8  PLT 421* 363   Basic Metabolic Panel: Recent Labs  Lab 10/07/20 1337 10/07/20 1805 10/08/20 0408 10/09/20 0225  NA 138  --  136 130*  K 4.2  --  3.4* 4.7  CL 97*  --  93* 92*  CO2 31  --  33* 28  GLUCOSE 110*  --  131* 192*  BUN 29*  --  22 19  CREATININE 1.00  --  1.11* 1.03*  CALCIUM 9.3  --  9.2 9.2  MG  --  2.0  --   --     Studies: No results found.  Scheduled Meds: . apixaban  5 mg Oral BID  . digoxin  0.125 mg Oral Daily  . diltiazem  30 mg Oral Q6H  . docusate sodium  100 mg Oral BID  . doxycycline  100 mg Oral Q12H  . DULoxetine  120 mg Oral Daily  . fluticasone furoate-vilanterol  1 puff Inhalation Daily  . furosemide  40 mg Intravenous Q12H  . ipratropium  0.5 mg Nebulization TID  . levalbuterol  0.63 mg Nebulization TID  . lidocaine  1 patch Transdermal Q24H  . methylPREDNISolone (SOLU-MEDROL) injection  80 mg Intravenous BID  . metoprolol tartrate  25 mg Oral BID  . nicotine  21 mg Transdermal Daily  . pantoprazole  40 mg Oral Daily  . pravastatin  20 mg Oral Daily  . QUEtiapine  25 mg Oral QHS  . sodium chloride flush  3 mL Intravenous Q12H   Continuous Infusions: . sodium chloride     PRN Meds: sodium chloride, acetaminophen, metoprolol tartrate, ondansetron (ZOFRAN) IV, sodium chloride flush  Time spent: 35 minutes  Author: Lynden Oxford, MD [facility] 10/09/2020 8:00 PM  To reach On-call, see care teams to locate the attending and reach out via [email or web address]. Between 7PM-7AM, please contact night-coverage If you still have difficulty reaching the attending provider, please page the [facility] (Director on Call) for [facility] on amion for assistance.

## 2020-10-09 NOTE — TOC Transition Note (Addendum)
Transition of Care [facility]) - CM/SW Discharge Note   Patient Details  Name: Theresa Mendoza MRN: [id number] Date of Birth: 08-06-1945  [facility] CM/SW Contact:  Leone Haven, RN Phone Number: 10/09/2020, 4:39 PM   Clinical Narrative:    NCM spoke with patient, offered choice, she states she has no preference.  She states to call her son to check and make sure it is ok with him also because she lives with him and his wife.  NCM contacted her son, he states it is ok with him for her to have [facility] services.  Patient has a w/chair, walker, cane and imogen oxygen at home.  NCM made referral to [facility] with [facility] for [facility] for CHF management and HHPT.  He is able to take referral.  Soc will begin 24 to 48 hrs post dc. NCM asked MD for orders also.  Patient states Shirlean Mylar is her PCP.  Final next level of care: Home w Home Health Services Barriers to Discharge: Continued Medical Work up   Patient Goals and CMS Choice Patient states their goals for this hospitalization and ongoing recovery are:: return home CMS Medicare.gov Compare Post Acute Care list provided to:: Patient Choice offered to / list presented to : Patient  Discharge Placement                       Discharge Plan and Services                  DME Agency: NA       HH Arranged: RN,Disease Management,PT HH Agency: [facility] Date [facility] Agency Contacted: 10/09/20 Time HH Agency Contacted: [phone] Representative spoke with at [facility] Agency: Kandee Keen  Social Determinants of Health (SDOH) Interventions     Readmission Risk Interventions No flowsheet data found.

## 2020-10-09 NOTE — Consult Note (Signed)
[facility] Face-to-Face Psychiatry Consult   Reason for Consult:  Depression  Referring Physician:  Dr Allena Katz Patient Identification: Theresa Mendoza MRN:  [id number] Principal Diagnosis: CHF Diagnosis:  Active Problems:   Major depressive disorder, recurrent episode, moderate (HCC)   CHF (congestive heart failure) (HCC)   Total Time spent with patient: 45 minutes  Subjective:   Theresa Mendoza is a 75 y.o. female patient admitted with CHF, consult for depression.  "I've been better but a whole lot worse before.  I still have my humor."  Client reports a history of "manic depressant" and no medications for "years" related to side effects of the mood stabilizers/antipsychotics.  Depression is a 7/10 today with no suicidal ideations.  Appetite is "good", sleep is "poor", minimal sleep for the past 3 nights.  The Ambien helped the first night, not since.  Stressor of selling her home and living with her son and daughter-in-law, not optional for her as she feels she is imposing on them and isolates to her room most of the time.  Discussed medication options and decided to stop the Ambien and start Seroquel, low dose for sleep and mood tonight.  No psychosis or mania symptoms or substance abuse.  HPI per MD: Theresa Mendoza is a 75 y.o. female with medical history significant of PAF on Coumadin, COPD Gold stage II with chronic hypoxic respiratory on home oxygen at bedtime, cigarette smoker, scented with increasing shortness of breath.  Patient claims she has been compliant with her Lasix and atenolol, which family confirmed as well.  Over the last 2 weeks, patient quickly developed breathing symptoms of shortness of breath, has been exertional, but her exercise tolerance has significantly decreased, since last week, walking short distance inside her house causing significant breathing symptoms.  Denies any cough, no chest pain no fever chills. She used to follow with cardiology every 6 months, but  lost follow-up since pandemic.  Patient smokes 1 pack a day, as per patient son over the phone, and son reported patient has had dry mouth due to chronic smoking, as a result has been drinking extra amount of fluid throughout the day.  Patient herself admitted about 1 gallon a day.  She also noticed gradual swelling of her legs, denies any pain or rash.  Patient reported she ran out nebulizing agent for several months. ED Course: Patient was found to have fluid overload, chest x-ray showed lungs congested but suspect right lower lobe infiltrates suspicious for pneumonia.  WBC 13.1. Received 1 dose of Lasix in ED.  Past Psychiatric History: depression, "manic depressant"  Risk to Self:  none Risk to Others:  none Prior Inpatient Therapy:  Yes, [facility] Prior Outpatient Therapy:  PCP  Past Medical History:  Past Medical History:  Diagnosis Date  . Anxiety   . Bipolar 1 disorder (HCC)   . Bowel obstruction (HCC)   . Cataract   . Chronic atrial fibrillation (HCC) 09/2014   On chronic anticoagulation with coumadin.  She has failed several DCCVs in the past.  . COPD (chronic obstructive pulmonary disease) (HCC)   . Dyslipidemia, goal LDL below 70 12/25/2015  . Encounter for tobacco use cessation counseling 12/04/2017  . Glaucoma   . Hx of migraine headaches   . Memory loss   . Mitral regurgitation    mild to moderate on echo 09/2014  . Old MI (myocardial infarction) 09/2014   cath with normal coronary arteries and EF 50-55%  . Osteoarthritis   . Pulmonary HTN (  HCC) 09/2014   Mild with PASP at cath  . RBBB   . Rheumatoid arthritis (HCC)   . Tobacco abuse     Past Surgical History:  Procedure Laterality Date  . ADENOIDECTOMY    . BACK SURGERY    . bowel obstruction    . CARDIAC CATHETERIZATION  09/2014   normal coronary arteries with mild pulmonary HTN, low normal LVF  . CHOLECYSTECTOMY    . NOSE SURGERY    . TONSILLECTOMY    . TRANSESOPHAGEAL ECHOCARDIOGRAM WITH CARDIOVERSION   09/2014  . VAGINAL HYSTERECTOMY     with BSO   Family History:  Family History  Problem Relation Age of Onset  . Tuberculosis Father   . Depression Father   . Alzheimer's disease Mother   . Lung cancer Mother    Family Psychiatric  History: see above Social History:  Social History   Substance and Sexual Activity  Alcohol Use No  . Alcohol/week: 0.0 standard drinks     Social History   Substance and Sexual Activity  Drug Use No    Social History   Socioeconomic History  . Marital status: Widowed    Spouse name: Not on file  . Number of children: Not on file  . Years of education: Not on file  . Highest education level: Not on file  Occupational History  . Not on file  Tobacco Use  . Smoking status: Current Every Day Smoker    Packs/day: 2.00    Years: 55.00    Pack years: 110.00    Types: Cigarettes  . Smokeless tobacco: Never Used  . Tobacco comment: Has cut back to 1 pk a day from 3 a day.  Substance and Sexual Activity  . Alcohol use: No    Alcohol/week: 0.0 standard drinks  . Drug use: No  . Sexual activity: Never  Other Topics Concern  . Not on file  Social History Narrative   ** Merged History Encounter **       Lives with son and daughter in law in a 2 story home.  Has 1 son.  Retired from [employer].  Education: high school.  Widowed.   Social Determinants of Health   Financial Resource Strain: Not on file  Food Insecurity: Not on file  Transportation Needs: Not on file  Physical Activity: Not on file  Stress: Not on file  Social Connections: Not on file   Additional Social History:    Allergies:   Allergies  Allergen Reactions  . Lithium     Patient became suicidal  . Tegretol [Carbamazepine]     UNK reaction    Labs:  Results for orders placed or performed during the hospital encounter of 10/07/20 (from the past 48 hour(s))  Resp Panel by RT-PCR (Flu A&B, Covid) Nasopharyngeal Swab     Status: None   Collection Time: 10/07/20  1:32 PM    Specimen: Nasopharyngeal Swab; Nasopharyngeal(NP) swabs in vial transport medium  Result Value Ref Range   SARS Coronavirus 2 by RT PCR NEGATIVE NEGATIVE    Comment: (NOTE) SARS-CoV-2 target nucleic acids are NOT DETECTED.  The SARS-CoV-2 RNA is generally detectable in upper respiratory specimens during the acute phase of infection. The lowest concentration of SARS-CoV-2 viral copies this assay can detect is 138 copies/mL. A negative result does not preclude SARS-Cov-2 infection and should not be used as the sole basis for treatment or other patient management decisions. A negative result may occur with  improper specimen collection/handling,  submission of specimen other than nasopharyngeal swab, presence of viral mutation(s) within the areas targeted by this assay, and inadequate number of viral copies(<138 copies/mL). A negative result must be combined with clinical observations, patient history, and epidemiological information. The expected result is Negative.  Fact Sheet for Patients:  BloggerCourse.com  Fact Sheet for Healthcare Providers:  SeriousBroker.it  This test is no t yet approved or cleared by the [place] FDA and  has been authorized for detection and/or diagnosis of SARS-CoV-2 by FDA under an Emergency Use Authorization (EUA). This EUA will remain  in effect (meaning this test can be used) for the duration of the COVID-19 declaration under Section 564(b)(1) of the Act, 21 U.S.C.section 360bbb-3(b)(1), unless the authorization is terminated  or revoked sooner.       Influenza A by PCR NEGATIVE NEGATIVE   Influenza B by PCR NEGATIVE NEGATIVE    Comment: (NOTE) The Xpert Xpress SARS-CoV-2/FLU/RSV plus assay is intended as an aid in the diagnosis of influenza from Nasopharyngeal swab specimens and should not be used as a sole basis for treatment. Nasal washings and aspirates are unacceptable for Xpert Xpress  SARS-CoV-2/FLU/RSV testing.  Fact Sheet for Patients: BloggerCourse.com  Fact Sheet for Healthcare Providers: SeriousBroker.it  This test is not yet approved or cleared by the [place] FDA and has been authorized for detection and/or diagnosis of SARS-CoV-2 by FDA under an Emergency Use Authorization (EUA). This EUA will remain in effect (meaning this test can be used) for the duration of the COVID-19 declaration under Section 564(b)(1) of the Act, 21 U.S.C. section 360bbb-3(b)(1), unless the authorization is terminated or revoked.  Performed at [facility], [address], [place], [place] [address]   Basic metabolic panel     Status: Abnormal   Collection Time: 10/07/20  1:37 PM  Result Value Ref Range   Sodium 138 135 - 145 mmol/L   Potassium 4.2 3.5 - 5.1 mmol/L   Chloride 97 (L) 98 - 111 mmol/L   CO2 31 22 - 32 mmol/L   Glucose, Bld 110 (H) 70 - 99 mg/dL    Comment: Glucose reference range applies only to samples taken after fasting for at least 8 hours.   BUN 29 (H) 8 - 23 mg/dL   Creatinine, Ser 6.04 0.44 - 1.00 mg/dL   Calcium 9.3 8.9 - 54.0 mg/dL   GFR, Estimated 59 (L) >60 mL/min    Comment: (NOTE) Calculated using the CKD-EPI Creatinine Equation (2021)    Anion gap 10 5 - 15    Comment: Performed at [facility], [address] [place], [place] [address]  CBC     Status: Abnormal   Collection Time: 10/07/20  1:37 PM  Result Value Ref Range   WBC 13.1 (H) 4.0 - 10.5 K/uL   RBC 4.86 3.87 - 5.11 MIL/uL   Hemoglobin 14.0 12.0 - 15.0 g/dL   HCT 14.7 82.9 - 56.2 %   MCV 91.2 80.0 - 100.0 fL   MCH 28.8 26.0 - 34.0 pg   MCHC 31.6 30.0 - 36.0 g/dL   RDW 13.0 86.5 - 78.4 %   Platelets 421 (H) 150 - 400 K/uL   nRBC 0.0 0.0 - 0.2 %    Comment: Performed at [facility], [address], [place], [place] [address]  Troponin I (High Sensitivity)     Status: Abnormal   Collection Time: 10/07/20   1:37 PM  Result Value Ref Range   Troponin  I (High Sensitivity) 28 (H) <18 ng/L    Comment: (NOTE) Elevated high sensitivity troponin I (hsTnI) values and significant  changes across serial measurements may suggest ACS but many other  chronic and acute conditions are known to elevate hsTnI results.  Refer to the "Links" section for chest pain algorithms and additional  guidance. Performed at [facility], [address] [place], [place] [address]   Brain natriuretic peptide     Status: Abnormal   Collection Time: 10/07/20  1:38 PM  Result Value Ref Range   B Natriuretic Peptide 702.4 (H) 0.0 - 100.0 pg/mL    Comment: Performed at [facility], [address], [place], [place] [address]  Protime-INR     Status: Abnormal   Collection Time: 10/07/20  1:40 PM  Result Value Ref Range   Prothrombin Time 20.4 (H) 11.4 - 15.2 seconds   INR 1.8 (H) 0.8 - 1.2    Comment: (NOTE) INR goal varies based on device and disease states. Performed at [facility], [address], [place], [place] [address]   Hepatic function panel     Status: None   Collection Time: 10/07/20  1:40 PM  Result Value Ref Range   Total Protein 6.5 6.5 - 8.1 g/dL   Albumin 3.6 3.5 - 5.0 g/dL   AST 17 15 - 41 U/L   ALT 21 0 - 44 U/L   Alkaline Phosphatase 65 38 - 126 U/L   Total Bilirubin 0.7 0.3 - 1.2 mg/dL   Bilirubin, Direct 0.2 0.0 - 0.2 mg/dL   Indirect Bilirubin 0.5 0.3 - 0.9 mg/dL    Comment: Performed at [facility], [address] [place], [place] [address]  Lactic acid, plasma     Status: None   Collection Time: 10/07/20  2:25 PM  Result Value Ref Range   Lactic Acid, Venous 1.2 0.5 - 1.9 mmol/L    Comment: Performed at [facility], [address], [place], [place] [address]  Troponin I (High Sensitivity)     Status: Abnormal   Collection Time: 10/07/20  2:25 PM  Result Value Ref Range   Troponin I (High Sensitivity) 26 (H) <18 ng/L    Comment: (NOTE) Elevated high  sensitivity troponin I (hsTnI) values and significant  changes across serial measurements may suggest ACS but many other  chronic and acute conditions are known to elevate hsTnI results.  Refer to the "Links" section for chest pain algorithms and additional  guidance. Performed at [facility], [address], [place], [place] [address]   Procalcitonin     Status: None   Collection Time: 10/07/20  2:25 PM  Result Value Ref Range   Procalcitonin <0.10 ng/mL    Comment:        Interpretation: PCT (Procalcitonin) <= 0.5 ng/mL: Systemic infection (sepsis) is not likely. Local bacterial infection is possible. (NOTE)       Sepsis PCT Algorithm           Lower Respiratory Tract                                      Infection PCT Algorithm    ----------------------------     ----------------------------         PCT < 0.25 ng/mL  PCT < 0.10 ng/mL          Strongly encourage             Strongly discourage   discontinuation of antibiotics    initiation of antibiotics    ----------------------------     -----------------------------       PCT 0.25 - 0.50 ng/mL            PCT 0.10 - 0.25 ng/mL               OR       >80% decrease in PCT            Discourage initiation of                                            antibiotics      Encourage discontinuation           of antibiotics    ----------------------------     -----------------------------         PCT >= 0.50 ng/mL              PCT 0.26 - 0.50 ng/mL               AND        <80% decrease in PCT             Encourage initiation of                                             antibiotics       Encourage continuation           of antibiotics    ----------------------------     -----------------------------        PCT >= 0.50 ng/mL                  PCT > 0.50 ng/mL               AND         increase in PCT                  Strongly encourage                                      initiation of antibiotics    Strongly  encourage escalation           of antibiotics                                     -----------------------------                                           PCT <= 0.25 ng/mL                                                 OR                                        >  80% decrease in PCT                                      Discontinue / Do not initiate                                             antibiotics  Performed at [facility], [address], [place], [place] [address]   Lactic acid, plasma     Status: None   Collection Time: 10/07/20  3:00 PM  Result Value Ref Range   Lactic Acid, Venous 1.2 0.5 - 1.9 mmol/L    Comment: Performed at [facility], [address] [place], [place] [address]  Digoxin level     Status: None   Collection Time: 10/07/20  6:05 PM  Result Value Ref Range   Digoxin Level 0.8 0.8 - 2.0 ng/mL    Comment: Performed at [facility], [address], [place], [place] [address]  TSH     Status: None   Collection Time: 10/07/20  6:05 PM  Result Value Ref Range   TSH 1.501 0.350 - 4.500 uIU/mL    Comment: Performed by a 3rd Generation assay with a functional sensitivity of <=0.01 uIU/mL. Performed at [facility], [address] [place], [place] [address]   Magnesium     Status: None   Collection Time: 10/07/20  6:05 PM  Result Value Ref Range   Magnesium 2.0 1.7 - 2.4 mg/dL    Comment: Performed at [facility], [address], [place], [place] [address]  Basic metabolic panel     Status: Abnormal   Collection Time: 10/08/20  4:08 AM  Result Value Ref Range   Sodium 136 135 - 145 mmol/L   Potassium 3.4 (L) 3.5 - 5.1 mmol/L   Chloride 93 (L) 98 - 111 mmol/L   CO2 33 (H) 22 - 32 mmol/L   Glucose, Bld 131 (H) 70 - 99 mg/dL    Comment: Glucose reference range applies only to samples taken after fasting for at least 8 hours.   BUN 22 8 - 23 mg/dL   Creatinine, Ser 6.96 (H) 0.44 - 1.00 mg/dL   Calcium 9.2 8.9 - 29.5 mg/dL    GFR, Estimated 52 (L) >60 mL/min    Comment: (NOTE) Calculated using the CKD-EPI Creatinine Equation (2021)    Anion gap 10 5 - 15    Comment: Performed at [facility], [address] [place], [place] [address]  Protime-INR     Status: Abnormal   Collection Time: 10/08/20  4:08 AM  Result Value Ref Range   Prothrombin Time 16.9 (H) 11.4 - 15.2 seconds   INR 1.4 (H) 0.8 - 1.2    Comment: (NOTE) INR goal varies based on device and disease states. Performed at [facility], [address], [place], [place] [address]   CBC     Status: Abnormal   Collection Time: 10/08/20  4:08 AM  Result Value Ref Range   WBC 12.8 (H) 4.0 - 10.5 K/uL   RBC 4.75 3.87 - 5.11 MIL/uL   Hemoglobin 13.8 12.0 - 15.0 g/dL   HCT 02.7 25.3 - 66.4 %   MCV 87.8 80.0 - 100.0 fL  MCH 29.1 26.0 - 34.0 pg   MCHC 33.1 30.0 - 36.0 g/dL   RDW 56.2 56.3 - 89.3 %   Platelets 363 150 - 400 K/uL   nRBC 0.0 0.0 - 0.2 %    Comment: Performed at [facility], [address] [place], [place] [address]  Basic metabolic panel     Status: Abnormal   Collection Time: 10/09/20  2:25 AM  Result Value Ref Range   Sodium 130 (L) 135 - 145 mmol/L   Potassium 4.7 3.5 - 5.1 mmol/L   Chloride 92 (L) 98 - 111 mmol/L   CO2 28 22 - 32 mmol/L   Glucose, Bld 192 (H) 70 - 99 mg/dL    Comment: Glucose reference range applies only to samples taken after fasting for at least 8 hours.   BUN 19 8 - 23 mg/dL   Creatinine, Ser 7.68 (H) 0.44 - 1.00 mg/dL   Calcium 9.2 8.9 - 11.5 mg/dL   GFR, Estimated 57 (L) >60 mL/min    Comment: (NOTE) Calculated using the CKD-EPI Creatinine Equation (2021)    Anion gap 10 5 - 15    Comment: Performed at [facility], [address], [place], [place] [address]  Protime-INR     Status: Abnormal   Collection Time: 10/09/20  2:25 AM  Result Value Ref Range   Prothrombin Time 21.1 (H) 11.4 - 15.2 seconds   INR 1.9 (H) 0.8 - 1.2    Comment: (NOTE) INR goal varies based on  device and disease states. Performed at [facility], [address], [place], [place] [address]     Current Facility-Administered Medications  Medication Dose Route Frequency Provider Last Rate Last Admin  . 0.9 %  sodium chloride infusion  250 mL Intravenous PRN Mikey College T, MD      . acetaminophen (TYLENOL) tablet 650 mg  650 mg Oral Q4H PRN Emeline General, MD   650 mg at 10/09/20 [phone]  . apixaban (ELIQUIS) tablet 5 mg  5 mg Oral BID Lewayne Bunting, MD      . digoxin Margit Banda) tablet 0.125 mg  0.125 mg Oral Daily Rolly Salter, MD   0.125 mg at 10/09/20 8453  . diltiazem (CARDIZEM) tablet 30 mg  30 mg Oral Q6H Crenshaw, Madolyn Frieze, MD      . docusate sodium (COLACE) capsule 100 mg  100 mg Oral BID Rolly Salter, MD   100 mg at 10/09/20 0953  . doxycycline (VIBRA-TABS) tablet 100 mg  100 mg Oral Q12H Mikey College T, MD   100 mg at 10/09/20 0953  . DULoxetine (CYMBALTA) DR capsule 120 mg  120 mg Oral Daily Mikey College T, MD   120 mg at 10/09/20 6468  . fluticasone furoate-vilanterol (BREO ELLIPTA) 200-25 MCG/INH 1 puff  1 puff Inhalation Daily Mikey College T, MD   1 puff at 10/09/20 0835  . furosemide (LASIX) injection 40 mg  40 mg Intravenous Q12H Mikey College T, MD   40 mg at 10/09/20 0245  . ipratropium (ATROVENT) nebulizer solution 0.5 mg  0.5 mg Nebulization TID Mikey College T, MD   0.5 mg at 10/09/20 0835  . levalbuterol (XOPENEX) nebulizer solution 0.63 mg  0.63 mg Nebulization TID Rolly Salter, MD   0.63 mg at 10/09/20 0834  . lidocaine (LIDODERM) 5 % 1 patch  1 patch Transdermal Q24H Rolly Salter, MD   1 patch at 10/09/20 [phone]  . methylPREDNISolone  sodium succinate (SOLU-MEDROL) 125 mg/2 mL injection 80 mg  80 mg Intravenous BID Rolly Salter, MD   80 mg at 10/09/20 0953  . metoprolol tartrate (LOPRESSOR) injection 2.5 mg  2.5 mg Intravenous Q4H PRN Rolly Salter, MD      . metoprolol tartrate (LOPRESSOR) tablet 25 mg  25 mg Oral BID Rolly Salter, MD   25 mg at  10/09/20 1006  . nicotine (NICODERM CQ - dosed in mg/24 hours) patch 21 mg  21 mg Transdermal Daily Mikey College T, MD   21 mg at 10/09/20 0957  . ondansetron (ZOFRAN) injection 4 mg  4 mg Intravenous Q6H PRN Mikey College T, MD   4 mg at 10/09/20 0041  . pantoprazole (PROTONIX) EC tablet 40 mg  40 mg Oral Daily Mikey College T, MD   40 mg at 10/09/20 0953  . pravastatin (PRAVACHOL) tablet 20 mg  20 mg Oral Daily Mikey College T, MD   20 mg at 10/09/20 0953  . QUEtiapine (SEROQUEL) tablet 25 mg  25 mg Oral QHS Charm Rings, NP      . sodium chloride flush (NS) 0.9 % injection 3 mL  3 mL Intravenous Q12H Mikey College T, MD   3 mL at 10/09/20 1008  . sodium chloride flush (NS) 0.9 % injection 3 mL  3 mL Intravenous PRN Emeline General, MD        Musculoskeletal: Strength & Muscle Tone: decreased Gait & Station: did not witness Patient leans: N/A            Psychiatric Specialty Exam:  Presentation  General Appearance: Casual; Appropriate for Environment  Eye Contact:Good  Speech:Clear and Coherent  Speech Volume:Normal  Handedness:Right   Mood and Affect  Mood:Depressed  Affect:Appropriate   Thought Process  Thought Processes:Coherent  Descriptions of Associations:Intact  Orientation:Full (Time, Place and Person)  Thought Content:Logical  History of Schizophrenia/Schizoaffective disorder:No data recorded Duration of Psychotic Symptoms:No data recorded Hallucinations:Hallucinations: None  Ideas of Reference:None  Suicidal Thoughts:Suicidal Thoughts: No  Homicidal Thoughts:Homicidal Thoughts: No   Sensorium  Memory:Immediate Good; Recent Good; Remote Good  Judgment:Good  Insight:Good   Executive Functions  Concentration:Good  Attention Span:Good  Recall:Good  Fund of Knowledge:Good  Language:Good   Psychomotor Activity  Psychomotor Activity:Psychomotor Activity: Decreased   Assets  Assets:Communication Skills; Desire for Improvement;  Leisure Time; Resilience   Sleep  Sleep:Sleep: Poor   Physical Exam: Physical Exam Vitals and nursing note reviewed.  Constitutional:      Appearance: She is well-developed.  HENT:     Head: Normocephalic.  Pulmonary:     Effort: Pulmonary effort is normal.  Musculoskeletal:     Cervical back: Normal range of motion.  Neurological:     General: No focal deficit present.     Mental Status: She is alert and oriented to person, place, and time.  Psychiatric:        Attention and Perception: Attention and perception normal.        Mood and Affect: Mood is depressed.        Speech: Speech normal.        Behavior: Behavior normal. Behavior is cooperative.        Thought Content: Thought content normal.        Cognition and Memory: Cognition and memory normal.        Judgment: Judgment normal.    Review of Systems  Psychiatric/Behavioral: Positive for depression. The patient is nervous/anxious.   All other  systems reviewed and are negative.  Blood pressure (!) 117/56, pulse (!) 111, temperature 97.9 F (36.6 C), temperature source Oral, resp. rate (!) 22, height 5\' 5"  (1.651 m), weight 78.7 kg, SpO2 97 %. Body mass index is 28.87 kg/m.  Treatment Plan Summary: Major depressive disorder, recurrent, moderate: -Continued Cymbalta 120 mg daily -Start Seroquel 25 mg at bedtime, may repeat once in an hour if not asleep  Insomnia: -See Seroquel order above  Disposition: No evidence of imminent risk to self or others at present.   Patient does not meet criteria for psychiatric inpatient admission.  Nanine Means, NP 10/09/2020 12:56 PM

## 2020-10-09 NOTE — Progress Notes (Signed)
ANTICOAGULATION CONSULT NOTE - Follow-up Consult  Pharmacy Consult for Lovenox Bridge and Warfarin Indication: atrial fibrillation  Allergies  Allergen Reactions  . Lithium     Patient became suicidal  . Tegretol [Carbamazepine]     UNK reaction    Patient Measurements: Height: 5\' 5"  (165.1 cm) Weight: 78.7 kg (173 lb 8 oz) IBW/kg (Calculated) : 57  Vital Signs: Temp: 98 F (36.7 C) (03/26 0415) Temp Source: Oral (03/26 0415) BP: 109/87 (03/26 0415) Pulse Rate: 113 (03/26 0415)  Labs: Recent Labs    10/07/20 1337 10/07/20 1340 10/07/20 1425 10/08/20 0408 10/09/20 0225  HGB 14.0  --   --  13.8  --   HCT 44.3  --   --  41.7  --   PLT 421*  --   --  363  --   LABPROT  --  20.4*  --  16.9* 21.1*  INR  --  1.8*  --  1.4* 1.9*  CREATININE 1.00  --   --  1.11* 1.03*  TROPONINIHS 28*  --  26*  --   --     Estimated Creatinine Clearance: 50.5 mL/min (A) (by C-G formula based on SCr of 1.03 mg/dL (H)).   Medical History: Past Medical History:  Diagnosis Date  . Anxiety   . Bipolar 1 disorder (HCC)   . Bowel obstruction (HCC)   . Cataract   . Chronic atrial fibrillation (HCC) 09/2014   On chronic anticoagulation with coumadin.  She has failed several DCCVs in the past.  . COPD (chronic obstructive pulmonary disease) (HCC)   . Dyslipidemia, goal LDL below 70 12/25/2015  . Encounter for tobacco use cessation counseling 12/04/2017  . Glaucoma   . Hx of migraine headaches   . Memory loss   . Mitral regurgitation    mild to moderate on echo 09/2014  . Old MI (myocardial infarction) 09/2014   cath with normal coronary arteries and EF 50-55%  . Osteoarthritis   . Pulmonary HTN (HCC) 09/2014   Mild with PASP 10/2014 at cath  . RBBB   . Rheumatoid arthritis (HCC)   . Tobacco abuse    Assessment: 71 YOF admitted with afib with RVR on Coumadin PTA for afib. Home warfarin dose 2.5 mg daily, except for 5 mg on Tues/Thurs. INR on admit 1.8. Pharmacy has been consulted to dose  warfarin and bridge with Lovenox until INR is therapeutic.  INR still slightly subtherapeutic at 1.9. Hgb and plts stable and no s/s of bleeding noted. Patient started on amiodarone gtt and doxycycline which may increase the concentration of warfarin.   Goal of Therapy:  INR 2-3 Monitor platelets by anticoagulation protocol: Yes   Plan:  Lovenox 80 mg subq q12h until INR is therapeutic  Warfarin 2.5 mg x1 Daily PT/INR Monitor CBC, s/s bleeding  66, PharmD PGY1 Acute Care Pharmacy Resident 10/09/2020 7:30 AM  Please check [email or web address] for unit specific pharmacy phone numbers.

## 2020-10-09 NOTE — Evaluation (Signed)
Physical Therapy Evaluation Patient Details Name: Theresa Mendoza MRN: [id number] DOB: [birth year] Today's Date: 10/09/2020   History of Present Illness  75 y.o. female presented to [facility] ED on 10/07/2020 with increasing SOB and LE swelling. Pt lost follow-up with cardiology since pandemic onset. Pt found to have fluid overload and suspected RLL PNA. PMH includes PAF, COPD Gold stage II with chronic hypoxic respiratory failure.  Clinical Impression  Pt presents to PT with deficits in activity tolerance, balance, gait, strength/power, endurance, and with pain. Ambulation tolerance is primarily limited by pt headache at this time. Pt mobilizes well this session, utilizing unilateral UE support to maintain balance, but not requiring physical assistance to mobilize around the room at this time. Pt will benefit from aggressive mobilization and progression to baseline once headache is better managed. PT recommends HHPT at this time, however the patient may progress to having no needs by the time of discharge.    Follow Up Recommendations Home health PT (may progress to no needs vs outpatient cardiopulmonary rehab)    Equipment Recommendations  None recommended by PT    Recommendations for Other Services       Precautions / Restrictions Precautions Precautions: Fall Precaution Comments: monitor SpO2 Restrictions Weight Bearing Restrictions: No      Mobility  Bed Mobility Overal bed mobility: Modified Independent             General bed mobility comments: increased time    Transfers Overall transfer level: Needs assistance Equipment used: 1 person hand held assist Transfers: Sit to/from Stand Sit to Stand: Min guard         General transfer comment: hand hold to steady  Ambulation/Gait Ambulation/Gait assistance: Min guard Gait Distance (Feet): 20 Feet Assistive device: IV Pole Gait Pattern/deviations: Step-through pattern Gait velocity: reduced Gait velocity  interpretation: <1.8 ft/sec, indicate of risk for recurrent falls General Gait Details: pt with slowed step-through gait, distance limited by HA  Stairs            Wheelchair Mobility    Modified Rankin (Stroke Patients Only)       Balance Overall balance assessment: Needs assistance Sitting-balance support: No upper extremity supported;Feet supported Sitting balance-Leahy Scale: Good     Standing balance support: No upper extremity supported Standing balance-Leahy Scale: Fair                               Pertinent Vitals/Pain Pain Assessment: Faces Faces Pain Scale: Hurts even more Pain Location: head Pain Descriptors / Indicators: Headache Pain Intervention(s): Monitored during session;Patient requesting pain meds-RN notified    Home Living Family/patient expects to be discharged to:: Private residence Living Arrangements: Children Available Help at Discharge: Family (son, DIL) Type of Home: House Home Access: Level entry     Home Layout: Two level Home Equipment: Cane - single point;Wheelchair - manual;Shower seat      Prior Function Level of Independence: Independent               Hand Dominance        Extremity/Trunk Assessment   Upper Extremity Assessment Upper Extremity Assessment: Overall WFL for tasks assessed    Lower Extremity Assessment Lower Extremity Assessment: Generalized weakness    Cervical / Trunk Assessment Cervical / Trunk Assessment: Kyphotic  Communication   Communication: No difficulties  Cognition Arousal/Alertness: Awake/alert Behavior During Therapy: WFL for tasks assessed/performed Overall Cognitive Status: Within Functional Limits for tasks assessed  General Comments General comments (skin integrity, edema, etc.): pt tachy at rest and with mobility in 130s, SpO2 stable while on 4L [place] during session. Pt reports she thinks she is on 4L at home  but is unsure    Exercises     Assessment/Plan    PT Assessment Patient needs continued PT services  PT Problem List Decreased strength;Decreased activity tolerance;Decreased balance;Decreased mobility;Cardiopulmonary status limiting activity;Pain       PT Treatment Interventions DME instruction;Gait training;Stair training;Functional mobility training;Therapeutic activities;Therapeutic exercise;Balance training;Patient/family education    PT Goals (Current goals can be found in the Care Plan section)  Acute Rehab PT Goals Patient Stated Goal: to reduce headache and return to baseline PT Goal Formulation: With patient Time For Goal Achievement: 10/23/20 Potential to Achieve Goals: Good    Frequency Min 3X/week   Barriers to discharge        Co-evaluation               AM-PAC PT "6 Clicks" Mobility  Outcome Measure Help needed turning from your back to your side while in a flat bed without using bedrails?: None Help needed moving from lying on your back to sitting on the side of a flat bed without using bedrails?: None Help needed moving to and from a bed to a chair (including a wheelchair)?: A Little Help needed standing up from a chair using your arms (e.g., wheelchair or bedside chair)?: A Little Help needed to walk in hospital room?: A Little Help needed climbing 3-5 steps with a railing? : A Lot 6 Click Score: 19    End of Session Equipment Utilized During Treatment: Oxygen Activity Tolerance: Patient limited by pain Patient left: in bed;with call bell/phone within reach;with bed alarm set Nurse Communication: Mobility status PT Visit Diagnosis: Muscle weakness (generalized) (M62.81);Pain Pain - part of body:  (head)    Time: 0910-0920 PT Time Calculation (min) (ACUTE ONLY): 10 min   Charges:   PT Evaluation $PT Eval Low Complexity: 1 Low          Arlyss Gandy, PT, DPT Acute Rehabilitation Pager: [phone]   Arlyss Gandy 10/09/2020, 9:54  AM

## 2020-10-09 NOTE — Progress Notes (Signed)
Report given to oncoming nurse, patient stable and sitting on bed.

## 2020-10-10 LAB — BASIC METABOLIC PANEL
Anion gap: 9 (ref 5–15)
BUN: 21 mg/dL (ref 8–23)
CO2: 30 mmol/L (ref 22–32)
Calcium: 9 mg/dL (ref 8.9–10.3)
Chloride: 94 mmol/L — ABNORMAL LOW (ref 98–111)
Creatinine, Ser: 1.07 mg/dL — ABNORMAL HIGH (ref 0.44–1.00)
GFR, Estimated: 55 mL/min — ABNORMAL LOW (ref >60)
Glucose, Bld: 163 mg/dL — ABNORMAL HIGH (ref 70–99)
Potassium: 3.9 mmol/L (ref 3.5–5.1)
Sodium: 133 mmol/L — ABNORMAL LOW (ref 135–145)

## 2020-10-10 LAB — DIGOXIN LEVEL: Digoxin Level: 0.8 ng/mL (ref 0.8–2.0)

## 2020-10-10 MED ORDER — HYDROCODONE-ACETAMINOPHEN 10-325 MG PO TABS
1.0000 | ORAL_TABLET | Freq: Once | ORAL | Status: AC | PRN
Start: 2020-10-10 — End: 2020-10-10
  Administered 2020-10-10: 1 via ORAL
  Filled 2020-10-10: qty 1

## 2020-10-10 MED ORDER — POLYETHYLENE GLYCOL 3350 17 G PO PACK
17.0000 g | PACK | Freq: Every day | ORAL | Status: DC
  Administered 2020-10-10 – 2020-10-13 (×4): 17 g via ORAL
  Filled 2020-10-10 (×4): qty 1

## 2020-10-10 MED ORDER — METHYLPREDNISOLONE SODIUM SUCC 40 MG IJ SOLR
40.0000 mg | Freq: Two times a day (BID) | INTRAMUSCULAR | Status: DC
  Administered 2020-10-10: 40 mg via INTRAVENOUS
  Filled 2020-10-10: qty 1

## 2020-10-10 MED ORDER — DILTIAZEM HCL 60 MG PO TABS
60.0000 mg | ORAL_TABLET | Freq: Four times a day (QID) | ORAL | Status: DC
  Administered 2020-10-10 – 2020-10-11 (×4): 60 mg via ORAL
  Filled 2020-10-10 (×4): qty 1

## 2020-10-10 MED ORDER — KETOROLAC TROMETHAMINE 15 MG/ML IJ SOLN
15.0000 mg | Freq: Three times a day (TID) | INTRAMUSCULAR | Status: DC | PRN
  Administered 2020-10-10: 15 mg via INTRAVENOUS
  Filled 2020-10-10: qty 1

## 2020-10-10 MED ORDER — FUROSEMIDE 40 MG PO TABS
40.0000 mg | ORAL_TABLET | Freq: Two times a day (BID) | ORAL | Status: DC
  Administered 2020-10-10 – 2020-10-11 (×2): 40 mg via ORAL
  Filled 2020-10-10 (×2): qty 1

## 2020-10-10 MED ORDER — LORAZEPAM 2 MG/ML IJ SOLN
0.5000 mg | Freq: Once | INTRAMUSCULAR | Status: AC | PRN
  Administered 2020-10-10: 0.5 mg via INTRAVENOUS
  Filled 2020-10-10: qty 1

## 2020-10-10 MED ORDER — LEVALBUTEROL HCL 0.63 MG/3ML IN NEBU: 0.6300 mg | INHALATION_SOLUTION | Freq: Four times a day (QID) | RESPIRATORY_TRACT | Status: DC | PRN

## 2020-10-10 MED ORDER — HYDROCODONE-ACETAMINOPHEN 10-325 MG PO TABS
1.0000 | ORAL_TABLET | Freq: Four times a day (QID) | ORAL | Status: DC | PRN
Start: 2020-10-10 — End: 2020-10-13
  Administered 2020-10-10 – 2020-10-13 (×8): 1 via ORAL
  Filled 2020-10-10 (×8): qty 1

## 2020-10-10 MED ORDER — SENNOSIDES-DOCUSATE SODIUM 8.6-50 MG PO TABS
1.0000 | ORAL_TABLET | Freq: Two times a day (BID) | ORAL | Status: DC
  Administered 2020-10-10 – 2020-10-13 (×7): 1 via ORAL
  Filled 2020-10-10 (×7): qty 1

## 2020-10-10 NOTE — Progress Notes (Signed)
[facility] Progress Note  Patient: Theresa Mendoza    GEX:[id number]  DOA: 10/07/2020     Date of Service: the patient was seen and examined on 10/10/2020  Brief hospital course: Past medical history of PAF, on Coumadin, COPD, chronic hypoxic respiratory failure, smoker.  Presents with complaints of cough and shortness of breath.  Found to have COPD exacerbation as well as volume overload.  Needing BiPAP on admission. Currently plan is continue respiratory support, diuresis and treat COPD exacerbation.  Assessment and Plan: 1.  Acute on chronic combined systolic and diastolic CHF Aggressively treated with IV diuresis.  Now switching to p.o. Echocardiogram shows EF of 45%. Previous cardiogram in 2017 shows preserved EF. Likely current tachycardia induced  patient denies any complaints of chest pain chest tightness or ACS. Continue to monitor for now. Appreciate cardiology consult. Continue BiPAP as needed.  2.  Acute COPD exacerbation Right basilar pneumonia Continue DuoNebs. Continue steroids. Change from prednisone to Solu-Medrol.  Reduce dose from 80 twice daily to 40 twice daily. Continue with antibiotics.  3.  Chronic A. fib with RVR Patient is supposed to be on Cardizem, atenolol as well as digoxin. She ran out of her digoxin. Currently she is on amiodarone, digoxin as well as beta-blocker. In the setting of low EF holding Cardizem. Cardiology recommending to switch to Eliquis. Cardiology added Cardizem as well.  Dose adjusted due to persistent tachycardia  4.  Cigarette smoker. Nicotine patch.  5.  Depression. Patient on 3/24 reported that she would like this to end.  At the time of my evaluation denies any complaints of suicidal ideation or homicidal ideation.  She has no plans to kill herself. Psychiatry consulted, able to meet initiate Seroquel.  6.  Goals of care conversation. Extensive discussion with the patient regarding her goals of care.  Currently  does not want to be resuscitated or intubated.  Would be okay to use BiPAP.  7.  Chronic pain syndrome. Patient is on Norco 10/325 5 tablets every day. We will initiate 3 times daily for now.   Diet: Cardiac diet DVT Prophylaxis: Therapeutic Anticoagulation with Warfarin and Lovenox   apixaban (ELIQUIS) tablet 5 mg    Advance goals of care discussion: Full code  Family Communication: no family was present at bedside, at the time of interview.   Disposition:  Status is: Inpatient  Remains inpatient appropriate because:IV treatments appropriate due to intensity of illness or inability to take PO   Dispo: The patient is from: Home              Anticipated d/c is to: Home              Patient currently is not medically stable to d/c.   Difficult to place patient   Subjective: Breathing still remains heavy.  No nausea no vomiting.  No chest pain.  Continues to have back pain.  Physical Exam:  General: Appear in mild distress, no Rash; Oral Mucosa Clear, moist. no Abnormal Neck Mass Or lumps, Conjunctiva normal  Cardiovascular: S1 and S2 Present, no Murmur, Respiratory: increased respiratory effort, Bilateral Air entry present and no crackles, occasional wheezes Abdomen: Bowel Sound present, Soft and no tenderness Extremities: Bilateral pedal edema Neurology: alert and oriented to time, place, and person affect appropriate. no new focal deficit Gait not checked due to patient safety concerns    Vitals:   10/10/20 0827 10/10/20 1148 10/10/20 1320 10/10/20 1544  BP:  128/77 (!) 124/57 118/88  Pulse:  97 100  Resp:   19 20  Temp:   98.7 F (37.1 C) (!) 97 F (36.1 C)  TempSrc:   Oral Oral  SpO2: 98% 100% 95% 97%  Weight:      Height:        Intake/Output Summary (Last 24 hours) at 10/10/2020 1758 Last data filed at 10/10/2020 1300 Gross per 24 hour  Intake 1610 ml  Output 2300 ml  Net -690 ml   Filed Weights   10/09/20 0231 10/10/20 0041 10/10/20 0500  Weight:  78.7 kg 78.7 kg 78.7 kg    Data Reviewed: I have personally reviewed and interpreted daily labs, tele strips, imaging. I reviewed all nursing notes, pharmacy notes, vitals, pertinent old records I have discussed plan of care as described above with RN and patient/family.  CBC: Recent Labs  Lab 10/07/20 1337 10/08/20 0408  WBC 13.1* 12.8*  HGB 14.0 13.8  HCT 44.3 41.7  MCV 91.2 87.8  PLT 421* 363   Basic Metabolic Panel: Recent Labs  Lab 10/07/20 1337 10/07/20 1805 10/08/20 0408 10/09/20 0225 10/10/20 0401  NA 138  --  136 130* 133*  K 4.2  --  3.4* 4.7 3.9  CL 97*  --  93* 92* 94*  CO2 31  --  33* 28 30  GLUCOSE 110*  --  131* 192* 163*  BUN 29*  --  22 19 21   CREATININE 1.00  --  1.11* 1.03* 1.07*  CALCIUM 9.3  --  9.2 9.2 9.0  MG  --  2.0  --   --   --     Studies: No results found.  Scheduled Meds: . apixaban  5 mg Oral BID  . digoxin  0.125 mg Oral Daily  . diltiazem  60 mg Oral Q6H  . doxycycline  100 mg Oral Q12H  . DULoxetine  120 mg Oral Daily  . fluticasone furoate-vilanterol  1 puff Inhalation Daily  . furosemide  40 mg Oral BID  . lidocaine  1 patch Transdermal Q24H  . methylPREDNISolone (SOLU-MEDROL) injection  80 mg Intravenous BID  . metoprolol tartrate  25 mg Oral BID  . nicotine  21 mg Transdermal Daily  . pantoprazole  40 mg Oral Daily  . polyethylene glycol  17 g Oral Daily  . pravastatin  20 mg Oral Daily  . QUEtiapine  25 mg Oral QHS  . senna-docusate  1 tablet Oral BID  . sodium chloride flush  3 mL Intravenous Q12H   Continuous Infusions: . sodium chloride     PRN Meds: sodium chloride, acetaminophen, HYDROcodone-acetaminophen, ketorolac, levalbuterol, metoprolol tartrate, ondansetron (ZOFRAN) IV, sodium chloride flush  Time spent: 35 minutes  Author: , MD [facility] 10/10/2020 5:58 PM  To reach On-call, see care teams to locate the attending and reach out via [email or web address]. Between 7PM-7AM, please contact  night-coverage If you still have difficulty reaching the attending provider, please page the [facility] (Director on Call) for [facility] on amion for assistance.

## 2020-10-10 NOTE — Progress Notes (Signed)
Progress Note  Patient Name: Theresa Mendoza Date of Encounter: 10/10/2020  [facility] Cardiologist: Armanda Magic, MD   Subjective   No CP or dyspnea  Inpatient Medications    Scheduled Meds: . apixaban  5 mg Oral BID  . digoxin  0.125 mg Oral Daily  . diltiazem  30 mg Oral Q6H  . docusate sodium  100 mg Oral BID  . doxycycline  100 mg Oral Q12H  . DULoxetine  120 mg Oral Daily  . fluticasone furoate-vilanterol  1 puff Inhalation Daily  . furosemide  40 mg Intravenous Q12H  . lidocaine  1 patch Transdermal Q24H  . methylPREDNISolone (SOLU-MEDROL) injection  80 mg Intravenous BID  . metoprolol tartrate  25 mg Oral BID  . nicotine  21 mg Transdermal Daily  . pantoprazole  40 mg Oral Daily  . pravastatin  20 mg Oral Daily  . QUEtiapine  25 mg Oral QHS  . sodium chloride flush  3 mL Intravenous Q12H   Continuous Infusions: . sodium chloride     PRN Meds: sodium chloride, acetaminophen, HYDROcodone-acetaminophen, ketorolac, levalbuterol, metoprolol tartrate, ondansetron (ZOFRAN) IV, sodium chloride flush   Vital Signs    Vitals:   10/10/20 0739 10/10/20 0813 10/10/20 0826 10/10/20 0827  BP: 105/79 111/66    Pulse: 77 (!) 119    Resp: 19     Temp: 98.2 F (36.8 C)     TempSrc: Oral     SpO2: 97%  98% 98%  Weight:      Height:        Intake/Output Summary (Last 24 hours) at 10/10/2020 0941 Last data filed at 10/10/2020 0849 Gross per 24 hour  Intake 2020 ml  Output 3300 ml  Net -1280 ml   Last 3 Weights 10/10/2020 10/10/2020 10/09/2020  Weight (lbs) 173 lb 9.6 oz 173 lb 9.6 oz 173 lb 8 oz  Weight (kg) 78.744 kg 78.744 kg 78.7 kg  Some encounter information is confidential and restricted. Go to Review Flowsheets activity to see all data.      Telemetry    Atrial fibrillation rate controlled - Personally Reviewed  Physical Exam   GEN: No acute distress.   Neck: No JVD Cardiac: irregular, tachycardic Respiratory: Clear to auscultation  bilaterally. GI: Soft, nontender, non-distended  MS: No edema Neuro:  Nonfocal  Psych: Normal affect   Labs    High Sensitivity Troponin:   Recent Labs  Lab 10/07/20 1337 10/07/20 1425  TROPONINIHS 28* 26*      Chemistry Recent Labs  Lab 10/07/20 1340 10/08/20 0408 10/09/20 0225 10/10/20 0401  NA  --  136 130* 133*  K  --  3.4* 4.7 3.9  CL  --  93* 92* 94*  CO2  --  33* 28 30  GLUCOSE  --  131* 192* 163*  BUN  --  22 19 21   CREATININE  --  1.11* 1.03* 1.07*  CALCIUM  --  9.2 9.2 9.0  PROT 6.5  --   --   --   ALBUMIN 3.6  --   --   --   AST 17  --   --   --   ALT 21  --   --   --   ALKPHOS 65  --   --   --   BILITOT 0.7  --   --   --   GFRNONAA  --  52* 57* 55*  ANIONGAP  --  10 10 9  Hematology Recent Labs  Lab 10/07/20 1337 10/08/20 0408  WBC 13.1* 12.8*  RBC 4.86 4.75  HGB 14.0 13.8  HCT 44.3 41.7  MCV 91.2 87.8  MCH 28.8 29.1  MCHC 31.6 33.1  RDW 14.8 15.0  PLT 421* 363    BNP Recent Labs  Lab 10/07/20 1338  BNP 702.4*     Radiology    ECHOCARDIOGRAM COMPLETE  Result Date: 10/08/2020    ECHOCARDIOGRAM REPORT   Patient Name:   Theresa Mendoza Date of Exam: 10/08/2020 Medical Rec #:  [id number]        Height:       65.0 in Accession #:    [id number]       Weight:       172.8 lb Date of Birth:  December 29, 1945       BSA:          1.859 m Patient Age:    74 years         BP:           113/79 mmHg Patient Gender: F                HR:           74 bpm. Exam Location:  Inpatient Procedure: 2D Echo, Cardiac Doppler and Color Doppler Indications:    Stroke  History:        Patient has no prior history of Echocardiogram examinations.  Sonographer:    Roosvelt Maser RDCS Referring Phys: [id number] Emeline General IMPRESSIONS  1. Left ventricular ejection fraction, by estimation, is 40 to 45%. The left ventricle has mildly decreased function. The left ventricle demonstrates global hypokinesis. Left ventricular diastolic function could not be evaluated. There is global  left ventricular hypokinesis, but there appears to be disproportionate hypokinesis of the inferior wall and inferior septum.  2. Right ventricular systolic function is moderately reduced. The right ventricular size is mildly enlarged. There is mildly elevated pulmonary artery systolic pressure.  3. Left atrial size was moderately dilated.  4. The mitral valve is normal in structure. Mild mitral valve regurgitation. No evidence of mitral stenosis.  5. The aortic valve is tricuspid. There is moderate calcification of the aortic valve. There is moderate thickening of the aortic valve. Aortic valve regurgitation is mild to moderate. Mild to moderate aortic valve stenosis. Aortic valve mean gradient measures 14.5 mmHg. Aortic valve Vmax measures 2.46 m/s.  6. The inferior vena cava is dilated in size with <50% respiratory variability, suggesting right atrial pressure of 15 mmHg. Conclusion(s)/Recommendation(s): Atrial fibrillation throughout the study. FINDINGS  Left Ventricle: Left ventricular ejection fraction, by estimation, is 40 to 45%. The left ventricle has mildly decreased function. The left ventricle demonstrates global hypokinesis. The left ventricular internal cavity size was normal in size. There is  no left ventricular hypertrophy. Left ventricular diastolic function could not be evaluated due to atrial fibrillation. Left ventricular diastolic function could not be evaluated.  LV Wall Scoring: There is global left ventricular hypokinesis, but there appears to be disproportionate hypokinesis of the inferior wall and inferior septum. Right Ventricle: The right ventricular size is mildly enlarged. No increase in right ventricular wall thickness. Right ventricular systolic function is moderately reduced. There is mildly elevated pulmonary artery systolic pressure. The tricuspid regurgitant velocity is 2.70 m/s, and with an assumed right atrial pressure of 15 mmHg, the estimated right ventricular systolic  pressure is 44.2 mmHg. Left Atrium: Left atrial size was moderately dilated. Right Atrium: Right atrial  size was normal in size. Pericardium: There is no evidence of pericardial effusion. Mitral Valve: The mitral valve is normal in structure. There is mild thickening of the mitral valve leaflet(s). Mild to moderate mitral annular calcification. Mild mitral valve regurgitation. No evidence of mitral valve stenosis. Tricuspid Valve: The tricuspid valve is normal in structure. Tricuspid valve regurgitation is not demonstrated. No evidence of tricuspid stenosis. Aortic Valve: The aortic valve is tricuspid. There is moderate calcification of the aortic valve. There is moderate thickening of the aortic valve. Aortic valve regurgitation is mild to moderate. Mild to moderate aortic stenosis is present. Aortic valve mean gradient measures 14.5 mmHg. Aortic valve peak gradient measures 24.1 mmHg. Aortic valve area, by VTI measures 1.19 cm. Pulmonic Valve: The pulmonic valve was normal in structure. Pulmonic valve regurgitation is not visualized. No evidence of pulmonic stenosis. Aorta: The aortic root is normal in size and structure. Venous: The inferior vena cava is dilated in size with less than 50% respiratory variability, suggesting right atrial pressure of 15 mmHg. IAS/Shunts: No atrial level shunt detected by color flow Doppler.  LEFT VENTRICLE PLAX 2D LVIDd:         4.30 cm      Diastology LVIDs:         3.10 cm      LV e' medial:  5.87 cm/s LV PW:         1.20 cm      LV e' lateral: 3.92 cm/s LV IVS:        1.10 cm LVOT diam:     1.86 cm LV SV:         56 LV SV Index:   30 LVOT Area:     2.72 cm  LV Volumes (MOD) LV vol d, MOD A2C: 95.1 ml LV vol d, MOD A4C: 105.0 ml LV vol s, MOD A2C: 55.1 ml LV vol s, MOD A4C: 47.8 ml LV SV MOD A2C:     40.0 ml LV SV MOD A4C:     105.0 ml LV SV MOD BP:      47.6 ml RIGHT VENTRICLE          IVC RV Basal diam:  3.30 cm  IVC diam: 2.10 cm LEFT ATRIUM             Index       RIGHT  ATRIUM           Index LA diam:        4.70 cm 2.53 cm/m  RA Area:     16.90 cm LA Vol (A2C):   80.4 ml 43.25 ml/m RA Volume:   46.10 ml  24.80 ml/m LA Vol (A4C):   38.0 ml 20.44 ml/m LA Biplane Vol: 59.9 ml 32.22 ml/m  AORTIC VALVE AV Area (Vmax):    1.37 cm AV Area (Vmean):   1.23 cm AV Area (VTI):     1.19 cm AV Vmax:           245.50 cm/s AV Vmean:          177.000 cm/s AV VTI:            0.470 m AV Peak Grad:      24.1 mmHg AV Mean Grad:      14.5 mmHg LVOT Vmax:         124.00 cm/s LVOT Vmean:        80.200 cm/s LVOT VTI:          0.205 m  LVOT/AV VTI ratio: 0.44  AORTA Ao Root diam: 2.30 cm Ao Asc diam:  2.90 cm TRICUSPID VALVE TR Peak grad:   29.2 mmHg TR Vmax:        270.00 cm/s  SHUNTS Systemic VTI:  0.20 m Systemic Diam: 1.86 cm Rachelle Hora Croitoru MD Electronically signed by Thurmon Fair MD Signature Date/Time: 10/08/2020/1:42:32 PM    Final    VAS [place] LOWER EXTREMITY VENOUS (DVT)  Result Date: 10/08/2020  Lower Venous DVT Study Indications: Swelling.  Comparison Study: No prior studies. Performing Technologist: Jean Rosenthal RDMS,RVT  Examination Guidelines: A complete evaluation includes B-mode imaging, spectral Doppler, color Doppler, and power Doppler as needed of all accessible portions of each vessel. Bilateral testing is considered an integral part of a complete examination. Limited examinations for reoccurring indications may be performed as noted. The reflux portion of the exam is performed with the patient in reverse Trendelenburg.  +---------+---------------+---------+-----------+----------+--------------+ RIGHT    CompressibilityPhasicitySpontaneityPropertiesThrombus Aging +---------+---------------+---------+-----------+----------+--------------+ CFV      Full           Yes      Yes                                 +---------+---------------+---------+-----------+----------+--------------+ SFJ      Full                                                         +---------+---------------+---------+-----------+----------+--------------+ FV Prox  Full                                                        +---------+---------------+---------+-----------+----------+--------------+ FV Mid   Full                                                        +---------+---------------+---------+-----------+----------+--------------+ FV DistalFull                                                        +---------+---------------+---------+-----------+----------+--------------+ PFV      Full                                                        +---------+---------------+---------+-----------+----------+--------------+ POP      Full           Yes      Yes                                 +---------+---------------+---------+-----------+----------+--------------+ PTV      Full                                                        +---------+---------------+---------+-----------+----------+--------------+  PERO     Full                                                        +---------+---------------+---------+-----------+----------+--------------+   +---------+---------------+---------+-----------+----------+--------------+ LEFT     CompressibilityPhasicitySpontaneityPropertiesThrombus Aging +---------+---------------+---------+-----------+----------+--------------+ CFV      Full           Yes      Yes                                 +---------+---------------+---------+-----------+----------+--------------+ SFJ      Full                                                        +---------+---------------+---------+-----------+----------+--------------+ FV Prox  Full                                                        +---------+---------------+---------+-----------+----------+--------------+ FV Mid   Full                                                         +---------+---------------+---------+-----------+----------+--------------+ FV DistalFull                                                        +---------+---------------+---------+-----------+----------+--------------+ PFV      Full                                                        +---------+---------------+---------+-----------+----------+--------------+ POP      Full           Yes      Yes                                 +---------+---------------+---------+-----------+----------+--------------+ PTV      Full                                                        +---------+---------------+---------+-----------+----------+--------------+ PERO     Full                                                        +---------+---------------+---------+-----------+----------+--------------+  Summary: RIGHT: - There is no evidence of deep vein thrombosis in the lower extremity.  - No cystic structure found in the popliteal fossa.  LEFT: - There is no evidence of deep vein thrombosis in the lower extremity.  - No cystic structure found in the popliteal fossa.  *See table(s) above for measurements and observations. Electronically signed by Heath Lark on 10/08/2020 at 6:55:32 PM.    Final     Patient Profile     75 year old female with past medical history of tobacco abuse, permanent atrial fibrillation, COPD on home oxygen, bipolar disorder admitted with acute combined systolic/diastolic congestive heart failure and atrial fibrillation with rapid ventricular response.   Echocardiogram shows ejection fraction 40 to 45%, moderate left atrial enlargement, moderate RV dysfunction with mild right ventricular enlargement, mild mitral regurgitation, mild to moderate aortic insufficiency and mild to moderate aortic stenosis with mean gradient 14 mmHg.  Assessment & Plan    1 acute combined systolic/diastolic congestive heart failure-patient appears to be euvolemic today.  Change  Lasix to 40 mg by mouth twice daily.  Follow renal function.  2 permanent atrial fibrillation-telemetry personally reviewed and shows elevated heart rate.  As outlined previously I would like to avoid amiodarone given baseline lung disease.  I would also like to avoid increasing beta-blockade in the setting of severe COPD.  We will continue metoprolol at present dose.  Continue digoxin.  Increase Cardizem to 60 mg every 6 hours.  Advance as needed for heart rate and as blood pressure allows.  Coumadin has been discontinued and she is now anticoagulated with apixaban.    3 cardiomyopathy with mild LV dysfunction-as outlined previously there is question whether this is related to tachycardia.  We are adjusting AV nodal blocking agents.  Once heart rate is controlled we will need to follow-up echocardiogram in 3 months to reassess LV function.  4 mild to moderate aortic stenosis/mild to moderate aortic insufficiency-she will need follow-up echoes as an outpatient.  5 severe COPD-pulmonary toilet per primary care.  6 tobacco abuse-patient counseled on discontinuing.  For questions or updates, please contact [facility] Please consult [email or web address] for contact info under        Signed, Olga Millers, MD  10/10/2020, 9:41 AM

## 2020-10-10 NOTE — Plan of Care (Signed)
  Problem: Education: Goal: Knowledge of General Education information will improve Description Including pain rating scale, medication(s)/side effects and non-pharmacologic comfort measures Outcome: Progressing   Problem: Clinical Measurements: Goal: Cardiovascular complication will be avoided Outcome: Progressing   Problem: Clinical Measurements: Goal: Respiratory complications will improve Outcome: Progressing   

## 2020-10-10 NOTE — Progress Notes (Signed)
Patient has displayed extreme anxiety throughout the shift. Her heart rate remained elevated between 130's-160's. She continued to be restless, complaining of constant back pain. She has been in and out of bed constantly with little rest in between. The patient's pain was not resolved from tylenol or Toradol. Shet informed me that she takes hydrocodone-Acetaminophen 10-325mg  TID. She believes she is experiencing withdrawal symptoms because she hasn't taken her home pain med since she's been hospitalized. It has not been ordered.  Covering provider Md Opyd was notified of the patient's condition and her need for home meds reconciliation. An one time order was given for hydrocodone-acetaminophen 10-325mg . After receiving the med fell asleep and seemed to be resting without distrubance. Her heart rate is lower.   Patient desires to have home medications reconciled.

## 2020-10-10 NOTE — Plan of Care (Signed)
°  Problem: Clinical Measurements: °Goal: Respiratory complications will improve °Outcome: Progressing °Goal: Cardiovascular complication will be avoided °Outcome: Progressing °  °Problem: Activity: °Goal: Risk for activity intolerance will decrease °Outcome: Progressing °  °

## 2020-10-10 NOTE — Progress Notes (Signed)
Patient refuses bed alarm tonight to help with anxiety and sleep, patient will call for help as needed.

## 2020-10-10 NOTE — Progress Notes (Signed)
Patient states that upon discharge, she doesn't think she will be able to stay with her son and sons family. She says the environment is stressful and she is not getting the help she needs. She would rather go to a ALF if possible.

## 2020-10-11 DIAGNOSIS — I420 Dilated cardiomyopathy: Secondary | ICD-10-CM

## 2020-10-11 DIAGNOSIS — I482 Chronic atrial fibrillation, unspecified: Secondary | ICD-10-CM

## 2020-10-11 DIAGNOSIS — I350 Nonrheumatic aortic (valve) stenosis: Secondary | ICD-10-CM

## 2020-10-11 DIAGNOSIS — I5021 Acute systolic (congestive) heart failure: Secondary | ICD-10-CM

## 2020-10-11 LAB — BASIC METABOLIC PANEL
Anion gap: 7 (ref 5–15)
BUN: 32 mg/dL — ABNORMAL HIGH (ref 8–23)
CO2: 33 mmol/L — ABNORMAL HIGH (ref 22–32)
Calcium: 9.3 mg/dL (ref 8.9–10.3)
Chloride: 96 mmol/L — ABNORMAL LOW (ref 98–111)
Creatinine, Ser: 1.36 mg/dL — ABNORMAL HIGH (ref 0.44–1.00)
GFR, Estimated: 41 mL/min — ABNORMAL LOW (ref >60)
Glucose, Bld: 203 mg/dL — ABNORMAL HIGH (ref 70–99)
Potassium: 3.8 mmol/L (ref 3.5–5.1)
Sodium: 136 mmol/L (ref 135–145)

## 2020-10-11 LAB — CBC
HCT: 41.6 % (ref 36.0–46.0)
Hemoglobin: 13.3 g/dL (ref 12.0–15.0)
MCH: 28.3 pg (ref 26.0–34.0)
MCHC: 32 g/dL (ref 30.0–36.0)
MCV: 88.5 fL (ref 80.0–100.0)
Platelets: 280 10*3/uL (ref 150–400)
RBC: 4.7 MIL/uL (ref 3.87–5.11)
RDW: 14.6 % (ref 11.5–15.5)
WBC: 12.1 10*3/uL — ABNORMAL HIGH (ref 4.0–10.5)
nRBC: 0 % (ref 0.0–0.2)

## 2020-10-11 MED ORDER — FUROSEMIDE 40 MG PO TABS
40.0000 mg | ORAL_TABLET | Freq: Every day | ORAL | Status: DC
  Administered 2020-10-12 – 2020-10-13 (×2): 40 mg via ORAL
  Filled 2020-10-11 (×2): qty 1

## 2020-10-11 MED ORDER — DILTIAZEM HCL ER COATED BEADS 240 MG PO CP24
240.0000 mg | ORAL_CAPSULE | Freq: Every day | ORAL | Status: DC
  Administered 2020-10-11 – 2020-10-13 (×3): 240 mg via ORAL
  Filled 2020-10-11 (×4): qty 1

## 2020-10-11 MED ORDER — PREDNISONE 50 MG PO TABS
50.0000 mg | ORAL_TABLET | Freq: Every day | ORAL | Status: DC
  Administered 2020-10-11 – 2020-10-12 (×2): 50 mg via ORAL
  Filled 2020-10-11 (×2): qty 1

## 2020-10-11 MED ORDER — METOPROLOL SUCCINATE ER 50 MG PO TB24: 50.0000 mg | ORAL_TABLET | Freq: Every day | ORAL | Status: DC

## 2020-10-11 MED ORDER — METOPROLOL SUCCINATE ER 50 MG PO TB24
50.0000 mg | ORAL_TABLET | Freq: Every day | ORAL | Status: DC
  Administered 2020-10-11 – 2020-10-12 (×2): 50 mg via ORAL
  Filled 2020-10-11 (×2): qty 1

## 2020-10-11 NOTE — Progress Notes (Signed)
[facility] Progress Note  Patient: Theresa Mendoza    EAV:[id number]  DOA: 10/07/2020     Date of Service: the patient was seen and examined on 10/11/2020  Brief hospital course: Past medical history of PAF, on Coumadin, COPD, chronic hypoxic respiratory failure, smoker.  Presents with complaints of cough and shortness of breath.  Found to have COPD exacerbation as well as volume overload.  Needing BiPAP on admission. Currently plan is monitor for improvement in COPD exacerbation and renal stabilization.  Assessment and Plan: 1.  Acute on chronic combined systolic and diastolic CHF Aggressively treated with IV diuresis.  Now switching to p.o. Echocardiogram shows EF of 45%. Previous cardiogram in 2017 shows preserved EF. Likely current tachycardia induced  patient denies any complaints of chest pain chest tightness or ACS. Continue to monitor for now. Appreciate cardiology consult. Has not needed BiPAP since admission.  Will transition to telemetry.  2.  Acute COPD exacerbation Right basilar pneumonia Continue DuoNebs. Continue steroids.  Initially started on prednisone, switch to Solu-Medrol. Now we will give switching back to prednisone. Continue with antibiotics.  Total 5-day treatment course.  3.  Chronic A. fib with RVR Patient is supposed to be on Cardizem, atenolol as well as digoxin. She ran out of her digoxin. Currently she is on amiodarone, digoxin as well as beta-blocker. In the setting of low EF holding Cardizem. Cardiology recommending to switch to Eliquis. Cardiology added Cardizem as well.   Now consolidating.  4.  Cigarette smoker. Nicotine patch.  Patient willing to quit.  5.  Depression. Patient on 3/24 reported that she would like this to end.  At the time of my evaluation denies any complaints of suicidal ideation or homicidal ideation.  She has no plans to kill herself. Psychiatry consulted, no indication for inpatient psychiatric  admission. Continue Seroquel.  6.  Goals of care conversation. Extensive discussion with the patient regarding her goals of care. Currently does not want to be resuscitated or intubated.  Would be okay to use BiPAP.  7.  Chronic pain syndrome. Patient is on Norco 10/325 5 tablets every day. We will initiate 3 times daily for now.  Diet: Cardiac diet DVT Prophylaxis:    apixaban (ELIQUIS) tablet 5 mg    Advance goals of care discussion: Limited code  Family Communication: no family was present at bedside, at the time of interview.  Patient politely refused to discuss with the family.  Disposition:  Status is: Inpatient  Remains inpatient appropriate because:IV treatments appropriate due to intensity of illness or inability to take PO  Dispo:  Patient From: Home  Planned Disposition: ALF  Medically stable for discharge: No     Subjective: No nausea no vomiting.  Cough still present.  Pain controlled.  Continues to have shortness of breath or swelling of the leg improving.  No bleeding.  Physical Exam:  General: Appear in mild distress, no Rash; Oral Mucosa Clear, moist. no Abnormal Neck Mass Or lumps, Conjunctiva normal  Cardiovascular: S1 and S2 Present, no Murmur, Respiratory: normal respiratory effort, Bilateral Air entry present and bilateral Crackles, Occasional wheezes Abdomen: Bowel Sound present, Soft and no tenderness Extremities: trace Pedal edema Neurology: alert and oriented to time, place, and person affect appropriate. no new focal deficit Gait not checked due to patient safety concerns  Vitals:   10/11/20 0500 10/11/20 0759 10/11/20 0920 10/11/20 1227  BP: (!) 120/52  (!) 146/71 140/74  Pulse:   92 93  Resp: 17  19 18  Temp:   98 F (36.7 C) 98.3 F (36.8 C)  TempSrc:   Oral Oral  SpO2:  98% 100% 100%  Weight:      Height:        Intake/Output Summary (Last 24 hours) at 10/11/2020 1542 Last data filed at 10/11/2020 2353 Gross per 24 hour   Intake 1079 ml  Output 3300 ml  Net -2221 ml   Filed Weights   10/10/20 0041 10/10/20 0500 10/11/20 0017  Weight: 78.7 kg 78.7 kg 78.4 kg    Data Reviewed: I have personally reviewed and interpreted daily labs, tele strips, imaging. I reviewed all nursing notes, pharmacy notes, vitals, pertinent old records I have discussed plan of care as described above with RN and patient/family.  CBC: Recent Labs  Lab 10/07/20 1337 10/08/20 0408 10/11/20 0341  WBC 13.1* 12.8* 12.1*  HGB 14.0 13.8 13.3  HCT 44.3 41.7 41.6  MCV 91.2 87.8 88.5  PLT 421* 363 280   Basic Metabolic Panel: Recent Labs  Lab 10/07/20 1337 10/07/20 1805 10/08/20 0408 10/09/20 0225 10/10/20 0401 10/11/20 0341  NA 138  --  136 130* 133* 136  K 4.2  --  3.4* 4.7 3.9 3.8  CL 97*  --  93* 92* 94* 96*  CO2 31  --  33* 28 30 33*  GLUCOSE 110*  --  131* 192* 163* 203*  BUN 29*  --  22 19 21  32*  CREATININE 1.00  --  1.11* 1.03* 1.07* 1.36*  CALCIUM 9.3  --  9.2 9.2 9.0 9.3  MG  --  2.0  --   --   --   --     Studies: No results found.  Scheduled Meds: . apixaban  5 mg Oral BID  . digoxin  0.125 mg Oral Daily  . diltiazem  240 mg Oral Daily  . doxycycline  100 mg Oral Q12H  . DULoxetine  120 mg Oral Daily  . fluticasone furoate-vilanterol  1 puff Inhalation Daily  . [START ON 10/12/2020] furosemide  40 mg Oral Daily  . lidocaine  1 patch Transdermal Q24H  . metoprolol succinate  50 mg Oral Daily  . nicotine  21 mg Transdermal Daily  . pantoprazole  40 mg Oral Daily  . polyethylene glycol  17 g Oral Daily  . pravastatin  20 mg Oral Daily  . predniSONE  50 mg Oral Q breakfast  . QUEtiapine  25 mg Oral QHS  . senna-docusate  1 tablet Oral BID  . sodium chloride flush  3 mL Intravenous Q12H   Continuous Infusions: . sodium chloride     PRN Meds: sodium chloride, acetaminophen, HYDROcodone-acetaminophen, levalbuterol, metoprolol tartrate, ondansetron (ZOFRAN) IV, sodium chloride flush  Time spent:  35 minutes  Author: 10/14/2020, MD [facility] 10/11/2020 3:42 PM  To reach On-call, see care teams to locate the attending and reach out via [email or web address]. Between 7PM-7AM, please contact night-coverage If you still have difficulty reaching the attending provider, please page the [facility] (Director on Call) for [facility] on amion for assistance.

## 2020-10-11 NOTE — Care Management Important Message (Signed)
Important Message  Patient Details  Name: Theresa Mendoza MRN: [id number] Date of Birth: 06/11/1946   Medicare Important Message Given:  Yes     Renie Ora 10/11/2020, 8:19 AM

## 2020-10-11 NOTE — Progress Notes (Signed)
Orthopedic Tech Progress Note Patient Details:  Theresa Mendoza [birth year] [id number]  Ortho Devices Type of Ortho Device: Roland Rack boot Ortho Device/Splint Location: BLE Ortho Device/Splint Interventions: Ordered,Application   Post Interventions Patient Tolerated: Well Instructions Provided: Care of device   Donald Pore 10/11/2020, 2:07 PM

## 2020-10-11 NOTE — Progress Notes (Signed)
Progress Note  Patient Name: Theresa Mendoza Date of Encounter: 10/11/2020  [facility] Cardiologist: Armanda Magic, MD   Subjective   She has not had any chest pain or SOB  Inpatient Medications    Scheduled Meds: . apixaban  5 mg Oral BID  . digoxin  0.125 mg Oral Daily  . diltiazem  60 mg Oral Q6H  . doxycycline  100 mg Oral Q12H  . DULoxetine  120 mg Oral Daily  . fluticasone furoate-vilanterol  1 puff Inhalation Daily  . furosemide  40 mg Oral BID  . lidocaine  1 patch Transdermal Q24H  . metoprolol tartrate  25 mg Oral BID  . nicotine  21 mg Transdermal Daily  . pantoprazole  40 mg Oral Daily  . polyethylene glycol  17 g Oral Daily  . pravastatin  20 mg Oral Daily  . predniSONE  50 mg Oral Q breakfast  . QUEtiapine  25 mg Oral QHS  . senna-docusate  1 tablet Oral BID  . sodium chloride flush  3 mL Intravenous Q12H   Continuous Infusions: . sodium chloride     PRN Meds: sodium chloride, acetaminophen, HYDROcodone-acetaminophen, levalbuterol, metoprolol tartrate, ondansetron (ZOFRAN) IV, sodium chloride flush   Vital Signs    Vitals:   10/11/20 0400 10/11/20 0500 10/11/20 0759 10/11/20 0920  BP: (!) 132/55 (!) 120/52  (!) 146/71  Pulse: 85   92  Resp: 15 17  19   Temp:    98 F (36.7 C)  TempSrc:    Oral  SpO2: 95%  98% 100%  Weight:      Height:        Intake/Output Summary (Last 24 hours) at 10/11/2020 1110 Last data filed at 10/11/2020 1610 Gross per 24 hour  Intake 1079 ml  Output 4100 ml  Net -3021 ml   Last 3 Weights 10/11/2020 10/10/2020 10/10/2020  Weight (lbs) 172 lb 14.4 oz 173 lb 9.6 oz 173 lb 9.6 oz  Weight (kg) 78.427 kg 78.744 kg 78.744 kg  Some encounter information is confidential and restricted. Go to Review Flowsheets activity to see all data.      Telemetry  Atrial fibrillation ith CVR - Personally Reviewed  Physical Exam   GEN: Well nourished, well developed in no acute distress HEENT: Normal NECK: No JVD; No carotid  bruits LYMPHATICS: No lymphadenopathy CARDIAC:irregularly irregular, no murmurs, rubs, gallops RESPIRATORY:  Clear to auscultation without rales, wheezing or rhonchi  ABDOMEN: Soft, non-tender, non-distended MUSCULOSKELETAL:  No edema; No deformity  SKIN: Warm and dry NEUROLOGIC:  Alert and oriented x 3 PSYCHIATRIC:  Normal affect    Labs    High Sensitivity Troponin:   Recent Labs  Lab 10/07/20 1337 10/07/20 1425  TROPONINIHS 28* 26*      Chemistry Recent Labs  Lab 10/07/20 1340 10/08/20 0408 10/09/20 0225 10/10/20 0401 10/11/20 0341  NA  --    < > 130* 133* 136  K  --    < > 4.7 3.9 3.8  CL  --    < > 92* 94* 96*  CO2  --    < > 28 30 33*  GLUCOSE  --    < > 192* 163* 203*  BUN  --    < > 19 21 32*  CREATININE  --    < > 1.03* 1.07* 1.36*  CALCIUM  --    < > 9.2 9.0 9.3  PROT 6.5  --   --   --   --  ALBUMIN 3.6  --   --   --   --   AST 17  --   --   --   --   ALT 21  --   --   --   --   ALKPHOS 65  --   --   --   --   BILITOT 0.7  --   --   --   --   GFRNONAA  --    < > 57* 55* 41*  ANIONGAP  --    < > 10 9 7    < > = values in this interval not displayed.     Hematology Recent Labs  Lab 10/07/20 1337 10/08/20 0408 10/11/20 0341  WBC 13.1* 12.8* 12.1*  RBC 4.86 4.75 4.70  HGB 14.0 13.8 13.3  HCT 44.3 41.7 41.6  MCV 91.2 87.8 88.5  MCH 28.8 29.1 28.3  MCHC 31.6 33.1 32.0  RDW 14.8 15.0 14.6  PLT 421* 363 280    BNP Recent Labs  Lab 10/07/20 1338  BNP 702.4*     Radiology    No results found.  Patient Profile     75 year old female with past medical history of tobacco abuse, permanent atrial fibrillation, COPD on home oxygen, bipolar disorder admitted with acute combined systolic/diastolic congestive heart failure and atrial fibrillation with rapid ventricular response.   Echocardiogram shows ejection fraction 40 to 45%, moderate left atrial enlargement, moderate RV dysfunction with mild right ventricular enlargement, mild mitral  regurgitation, mild to moderate aortic insufficiency and mild to moderate aortic stenosis with mean gradient 14 mmHg.  Assessment & Plan    Acute combined systolic/diastolic congestive heart failure -patient appears to be euvolemic today.   -he was changed to Lasix 40mg  BID PO yesterday -he put out 4.1L yesterday and is net neg 5L -SCr bumped slightly to 1.36 from 1.07 yesterday and CO2 increased to 33 -will decrease Lasix to 40mg  daily -consider addition of ARB and spiro outpt but will hold off in starting at this time as we need to preserve BP to allow uptitration of AVN blocking agent.   Permanent atrial fibrillation -would like to avoid amiodarone given baseline lung disease and like to avoid increasing beta-blockade in the setting of severe COPD.   -Continue digoxin.  -change Cardizem short acting to long acting Cardizem CD 240mg  daily >>LVF is mildly reduced but needs HR control and LVF will likely improve with rate control  -Coumadin has been discontinued and she is now anticoagulated with apixaban.   -continue apixaban 5mg  BID, digoxin 0.125mg  daily -change Lopressor 25mg  BID to B1 selective Toprol XL 50mg  daily  Cardiomyopathy with mild LV dysfunction -as outlined previously there is question whether this is related to tachycardia.   -We are adjusting AV nodal blocking agents.   -once HR controlled consider addition of ARB and spiro low dose -Once heart rate is controlled we will need to follow-up echocardiogram in 3 months to reassess LV function.  Mild to moderate aortic stenosis/mild to moderate aortic insufficiency -she will need follow-up echoes as an outpatient.  Severe COPD -pulmonary toilet per primary care.  I have spent a total of 30 minutes with patient reviewing 2D echo , telemetry, EKGs, labs and examining patient as well as establishing an assessment and plan that was discussed with the patient.  > 50% of time was spent in direct patient care.    For  questions or updates, please contact [facility] Please consult [email or web address] for contact info  under        Signed, Armanda Magic, MD  10/11/2020, 11:10 AM

## 2020-10-11 NOTE — Consult Note (Signed)
   [facility] Inpatient Consult   10/11/2020  [place] 11/03/1945 [id number]   [facility] [employer] [ACO] Patient: Medicare DCE  Patient screened for hospitalization with noted and discussed in HF progression meeting to assess for potential [place] Management service needs for post hospital transition. Reported patient inquring about assisted living information.  Spoke with patient at bedside.  She states she has basically avoided going out during COVID-19 pandemic, states she is vaccinated, and had other needs met.  She endorses Dr. Maurice Small as her primary care provider, [facility], Jacklynn Ganong, this provider is listed for the [facility] follow up call and appointments.  Plan:  Provided the patient with a brochure and she states she will share the information with her son, Corene Cornea.  Continue to follow progress and disposition to assess for post hospital care management needs.    For questions contact:   Natividad Brood, RN BSN [place] [facility] Liaison  [phone] business mobile phone Toll free office [phone]  Fax number: [phone] [email or web address]

## 2020-10-11 NOTE — TOC Benefit Eligibility Note (Signed)
Patient Product/process development scientist completed.    The patient is currently admitted and upon discharge could be taking Eliquis 5 mg.  The current 30 day co-pay is, $47.00.   The patient is insured through [employer] [place] [employer] Part [place]    Theresa Mendoza, CPhT Pharmacy Patient Advocate Specialist [facility] Antimicrobial Stewardship Team Direct Number: [phone]  Fax: [phone]

## 2020-10-11 NOTE — Progress Notes (Signed)
Physical Therapy Treatment Patient Details Name: Theresa Mendoza MRN: [id number] DOB: 12-09-1945 Today's Date: 10/11/2020    History of Present Illness 75 y.o. female presented to [facility] ED on 10/07/2020 with increasing SOB and LE swelling. Pt lost follow-up with cardiology since pandemic onset. Pt found to have fluid overload and suspected RLL PNA. PMH includes PAF, COPD Gold stage II with chronic hypoxic respiratory failure.    PT Comments    Patient progressing well towards PT goals. Reports feeling better since admission but low energy levels and fatigue. Tolerated within room ambulation with supervision for safety. No evidence of imbalance. 2/4 DOE after 62' but VSS with Sp02 remaining >90% on 3L/min 02 [place] and HR up to 112 bpm max with activity. Encouraged increasing mobility while in the hospital. Limited today by headache and not wanting to wear socks to go into hallway due to unna boots. Will plan for stair training next session as tolerated. Will follow.   Follow Up Recommendations  Other (comment) (outpatient cardiopulmonary rehab)     Equipment Recommendations  None recommended by PT    Recommendations for Other Services       Precautions / Restrictions Precautions Precautions: Fall;Other (comment) Precaution Comments: monitor SpO2 Restrictions Weight Bearing Restrictions: No    Mobility  Bed Mobility Overal bed mobility: Modified Independent             General bed mobility comments: No assist needed.    Transfers Overall transfer level: Needs assistance Equipment used: None Transfers: Sit to/from Stand Sit to Stand: Supervision         General transfer comment: Supervision for safety. Stood from [employer].  Ambulation/Gait Ambulation/Gait assistance: Supervision Gait Distance (Feet): 80 Feet Assistive device: None Gait Pattern/deviations: Step-through pattern;Decreased stride length Gait velocity: reduced   General Gait Details: Slow, mostly steady  gait in room, pt did not want to wear socks due to unna boots so limited to within room ambulation. 2/4 DOE. HR up to 112 bpm, Sp02 remained >90% on 3L/min 02 [place].   Stairs             Wheelchair Mobility    Modified Rankin (Stroke Patients Only)       Balance Overall balance assessment: Needs assistance Sitting-balance support: No upper extremity supported;Feet supported Sitting balance-Leahy Scale: Good     Standing balance support: During functional activity Standing balance-Leahy Scale: Fair Standing balance comment: Supervision for safety.                            Cognition Arousal/Alertness: Awake/alert Behavior During Therapy: WFL for tasks assessed/performed Overall Cognitive Status: No family/caregiver present to determine baseline cognitive functioning                                 General Comments: Does not know the day of the week but able to state month and year. Reports she started thinking more like herself last night and today. Does not recall beginning of hospitalization.      Exercises      General Comments General comments (skin integrity, edema, etc.): SP02 remained >90% on 3L/min 02 [place], HR up to 112 bpm with activity.      Pertinent Vitals/Pain Pain Assessment: Faces Faces Pain Scale: Hurts even more Pain Location: head, LEs Pain Descriptors / Indicators: Headache;Sore;Aching Pain Intervention(s): Monitored during session;Repositioned;Patient requesting pain meds-RN notified  Home Living                      Prior Function            PT Goals (current goals can now be found in the care plan section) Progress towards PT goals: Progressing toward goals    Frequency    Min 3X/week      PT Plan Current plan remains appropriate    Co-evaluation              AM-PAC PT "6 Clicks" Mobility   Outcome Measure  Help needed turning from your back to your side while in a flat bed without  using bedrails?: None Help needed moving from lying on your back to sitting on the side of a flat bed without using bedrails?: None Help needed moving to and from a bed to a chair (including a wheelchair)?: A Little Help needed standing up from a chair using your arms (e.g., wheelchair or bedside chair)?: A Little Help needed to walk in hospital room?: A Little Help needed climbing 3-5 steps with a railing? : A Little 6 Click Score: 20    End of Session Equipment Utilized During Treatment: Oxygen Activity Tolerance: Patient limited by pain;Patient tolerated treatment well Patient left: in bed;with call bell/phone within reach Nurse Communication: Mobility status;Patient requests pain meds PT Visit Diagnosis: Muscle weakness (generalized) (M62.81);Pain Pain - Right/Left:  (bil) Pain - part of body: Leg (head)     Time: 1123-1140 PT Time Calculation (min) (ACUTE ONLY): 17 min  Charges:  $Therapeutic Exercise: 8-22 mins                     Theresa Mendoza, PT, DPT Acute Rehabilitation Services Pager [phone] Office [phone]       Theresa Mendoza 10/11/2020, 1:00 PM

## 2020-10-12 DIAGNOSIS — I4891 Unspecified atrial fibrillation: Secondary | ICD-10-CM

## 2020-10-12 DIAGNOSIS — I10 Essential (primary) hypertension: Secondary | ICD-10-CM

## 2020-10-12 DIAGNOSIS — I509 Heart failure, unspecified: Secondary | ICD-10-CM

## 2020-10-12 DIAGNOSIS — I5023 Acute on chronic systolic (congestive) heart failure: Secondary | ICD-10-CM

## 2020-10-12 LAB — BASIC METABOLIC PANEL
Anion gap: 7 (ref 5–15)
BUN: 30 mg/dL — ABNORMAL HIGH (ref 8–23)
CO2: 35 mmol/L — ABNORMAL HIGH (ref 22–32)
Calcium: 8.8 mg/dL — ABNORMAL LOW (ref 8.9–10.3)
Chloride: 95 mmol/L — ABNORMAL LOW (ref 98–111)
Creatinine, Ser: 1 mg/dL (ref 0.44–1.00)
GFR, Estimated: 59 mL/min — ABNORMAL LOW (ref >60)
Glucose, Bld: 120 mg/dL — ABNORMAL HIGH (ref 70–99)
Potassium: 4.8 mmol/L (ref 3.5–5.1)
Sodium: 137 mmol/L (ref 135–145)

## 2020-10-12 MED ORDER — PREDNISONE 20 MG PO TABS: 20.0000 mg | ORAL_TABLET | Freq: Every day | ORAL | Status: DC

## 2020-10-12 MED ORDER — PREDNISONE 20 MG PO TABS
30.0000 mg | ORAL_TABLET | Freq: Every day | ORAL | Status: AC
  Administered 2020-10-13: 30 mg via ORAL
  Filled 2020-10-12: qty 1

## 2020-10-12 MED ORDER — METOPROLOL SUCCINATE ER 50 MG PO TB24
75.0000 mg | ORAL_TABLET | Freq: Every day | ORAL | Status: DC
  Administered 2020-10-13: 75 mg via ORAL
  Filled 2020-10-12: qty 1

## 2020-10-12 MED ORDER — METOPROLOL SUCCINATE ER 25 MG PO TB24
25.0000 mg | ORAL_TABLET | Freq: Once | ORAL | Status: AC
  Administered 2020-10-12: 25 mg via ORAL
  Filled 2020-10-12: qty 1

## 2020-10-12 MED ORDER — PREDNISONE 10 MG PO TABS: 10.0000 mg | ORAL_TABLET | Freq: Every day | ORAL | Status: DC

## 2020-10-12 NOTE — Progress Notes (Signed)
Heart Failure Nurse Navigator Progress Note  PCP: Andee Poles., MD PCP-Cardiologist: Kym Groom., MD (new) Admission Diagnosis: acute resp fail, a/c chf, AF RVR Admitted from: home-lives with son and daughter-in-law  Presentation:   Theresa Mendoza presented with SOB and AF RVR. Pt resting in bed on home O2 levels per [place]. During interview pt was somewhat interactive but also stated several times that her son does most the decision making. Pt agreed to [facility] TOC and stated her son/daughter in law would provide transportation. Pt not ready to quit smoking, educated and encouraged cesssation.   ECHO/ LVEF: 40-45%.  7/17: 60-65%.  Clinical Course:  Past Medical History:  Diagnosis Date  . Anxiety   . Bipolar 1 disorder (HCC)   . Bowel obstruction (HCC)   . Cataract   . Chronic atrial fibrillation (HCC) 09/2014   On chronic anticoagulation with coumadin.  She has failed several DCCVs in the past.  . COPD (chronic obstructive pulmonary disease) (HCC)   . Dyslipidemia, goal LDL below 70 12/25/2015  . Encounter for tobacco use cessation counseling 12/04/2017  . Glaucoma   . Hx of migraine headaches   . Memory loss   . Mitral regurgitation    mild to moderate on echo 09/2014  . Old MI (myocardial infarction) 09/2014   cath with normal coronary arteries and EF 50-55%  . Osteoarthritis   . Pulmonary HTN (HCC) 09/2014   Mild with PASP at cath  . RBBB   . Rheumatoid arthritis (HCC)   . Tobacco abuse      Social History   Socioeconomic History  . Marital status: Widowed    Spouse name: Not on file  . Number of children: Not on file  . Years of education: Not on file  . Highest education level: Not on file  Occupational History  . Not on file  Tobacco Use  . Smoking status: Current Every Day Smoker    Packs/day: 2.00    Years: 55.00    Pack years: 110.00    Types: Cigarettes  . Smokeless tobacco: Never Used  . Tobacco comment: Has cut back to 1 pk a day from 3 a day.   Substance and Sexual Activity  . Alcohol use: No    Alcohol/week: 0.0 standard drinks  . Drug use: No  . Sexual activity: Never  Other Topics Concern  . Not on file  Social History Narrative   ** Merged History Encounter **       Lives with son and daughter in law in a 2 story home.  Has 1 son.  Retired from [employer].  Education: high school.  Widowed.   Social Determinants of Health   Financial Resource Strain: Not on file  Food Insecurity: Not on file  Transportation Needs: Not on file  Physical Activity: Not on file  Stress: Not on file  Social Connections: Not on file    High Risk Criteria for Readmission and/or Poor Patient Outcomes:  Heart failure hospital admissions (last 6 months): 1   No Show rate: 11%  Difficult social situation: no  Demonstrates medication adherence: yes, states "son helps decide and set up her medications". Educated to take medications as prescribed.   Primary Language: English  Literacy level: able to read/write. Slight concern for adequate safe-self care.   Barriers of Care:   -knowledge  Considerations/Referrals:   Referral made to Heart Failure Pharmacist Stewardship: yes, at bedside. Referral made to Heart & Vascular TOC clinic: yes, 4/5 3pm  Items for Follow-up on DC/TOC: -medication optimization -medication compliance  Ozella Rocks, RN, BSN Heart Failure Nurse Navigator [phone]

## 2020-10-12 NOTE — Progress Notes (Signed)
  Mobility Specialist Criteria Algorithm Info.   Mobility Team: [facility] elevated:Self regulated Activity: Ambulated in hall; Dangled on edge of bed Range of motion: Active; All extremities Level of assistance: Contact guard assist, steadying assist Assistive device: None Minutes sitting in chair:  Minutes stood: 3 minutes Minutes ambulated: 3 minutes Distance ambulated (ft): 40 ft Mobility response: Tolerated well Bed Position: Semi-fowlers  Patient received lying supine in bed asleep, agreed to participate in mobility this morning. Offered patient non-slick socks but she refused to wear stating they're too slick. Explained the policy regarding safety and socks but patient politely declined to wear. Patient got the EOB and stood independently, complaining that the top of her feet was painful and swelling has increased. Ambulated in hallway 30 feet with slightly unsteady ataxic gait, she mentioned that is different from her baseline. Declined further ambulation in fear of "doing something stupid" and returned to room. She was slightly SOB but saturating in the mid-lower 90's, denied dizziness or lightheadedness. Patient tolerated ambulation well but a change from ambulatory baseline per pt. She is now dangling EOB with all needs met and call bell in reach.    10/12/2020 12:35 PM

## 2020-10-12 NOTE — Progress Notes (Signed)
Progress Note  Patient Name: Theresa Mendoza Date of Encounter: 10/12/2020  [facility] Cardiologist: Armanda Magic, MD   Subjective   Doing well.  Denies any chest pain or SOB.   Continues to diurese well putting out 3L yesterday and is net neg 7L since admit  Weight down 3lbs from admit  Inpatient Medications    Scheduled Meds: . apixaban  5 mg Oral BID  . digoxin  0.125 mg Oral Daily  . diltiazem  240 mg Oral Daily  . doxycycline  100 mg Oral Q12H  . DULoxetine  120 mg Oral Daily  . fluticasone furoate-vilanterol  1 puff Inhalation Daily  . furosemide  40 mg Oral Daily  . lidocaine  1 patch Transdermal Q24H  . metoprolol succinate  50 mg Oral Daily  . nicotine  21 mg Transdermal Daily  . pantoprazole  40 mg Oral Daily  . polyethylene glycol  17 g Oral Daily  . pravastatin  20 mg Oral Daily  . predniSONE  50 mg Oral Q breakfast  . QUEtiapine  25 mg Oral QHS  . senna-docusate  1 tablet Oral BID  . sodium chloride flush  3 mL Intravenous Q12H   Continuous Infusions: . sodium chloride     PRN Meds: sodium chloride, acetaminophen, HYDROcodone-acetaminophen, levalbuterol, metoprolol tartrate, ondansetron (ZOFRAN) IV, sodium chloride flush   Vital Signs    Vitals:   10/11/20 2139 10/12/20 0100 10/12/20 0341 10/12/20 0344  BP: 128/65  140/80   Pulse: 99  89   Resp: 18  19   Temp: 97.6 F (36.4 C)  97.6 F (36.4 C)   TempSrc: Oral  Oral   SpO2: 99% 93% 96%   Weight:    77.4 kg  Height:        Intake/Output Summary (Last 24 hours) at 10/12/2020 0752 Last data filed at 10/11/2020 2200 Gross per 24 hour  Intake 1083 ml  Output 3000 ml  Net -1917 ml   Last 3 Weights 10/12/2020 10/11/2020 10/10/2020  Weight (lbs) 170 lb 9.6 oz 172 lb 14.4 oz 173 lb 9.6 oz  Weight (kg) 77.384 kg 78.427 kg 78.744 kg  Some encounter information is confidential and restricted. Go to Review Flowsheets activity to see all data.      Telemetry  Atrial fibrillation with CVR -  Personally Reviewed  Physical Exam   GEN: Well nourished, well developed in no acute distress HEENT: Normal NECK: No JVD; No carotid bruits LYMPHATICS: No lymphadenopathy CARDIAC:irregularly irregular, no murmurs, rubs, gallops RESPIRATORY:  Clear to auscultation without rales, wheezing or rhonchi  ABDOMEN: Soft, non-tender, non-distended MUSCULOSKELETAL:  No edema; No deformity  SKIN: Warm and dry NEUROLOGIC:  Alert and oriented x 3 PSYCHIATRIC:  Normal affect    Labs    High Sensitivity Troponin:   Recent Labs  Lab 10/07/20 1337 10/07/20 1425  TROPONINIHS 28* 26*      Chemistry Recent Labs  Lab 10/07/20 1340 10/08/20 0408 10/10/20 0401 10/11/20 0341 10/12/20 0417  NA  --    < > 133* 136 137  K  --    < > 3.9 3.8 4.8  CL  --    < > 94* 96* 95*  CO2  --    < > 30 33* 35*  GLUCOSE  --    < > 163* 203* 120*  BUN  --    < > 21 32* 30*  CREATININE  --    < > 1.07* 1.36* 1.00  CALCIUM  --    < >  9.0 9.3 8.8*  PROT 6.5  --   --   --   --   ALBUMIN 3.6  --   --   --   --   AST 17  --   --   --   --   ALT 21  --   --   --   --   ALKPHOS 65  --   --   --   --   BILITOT 0.7  --   --   --   --   GFRNONAA  --    < > 55* 41* 59*  ANIONGAP  --    < > 9 7 7    < > = values in this interval not displayed.     Hematology Recent Labs  Lab 10/07/20 1337 10/08/20 0408 10/11/20 0341  WBC 13.1* 12.8* 12.1*  RBC 4.86 4.75 4.70  HGB 14.0 13.8 13.3  HCT 44.3 41.7 41.6  MCV 91.2 87.8 88.5  MCH 28.8 29.1 28.3  MCHC 31.6 33.1 32.0  RDW 14.8 15.0 14.6  PLT 421* 363 280    BNP Recent Labs  Lab 10/07/20 1338  BNP 702.4*     Radiology    No results found.  Patient Profile     75 year old female with past medical history of tobacco abuse, permanent atrial fibrillation, COPD on home oxygen, bipolar disorder admitted with acute combined systolic/diastolic congestive heart failure and atrial fibrillation with rapid ventricular response.   Echocardiogram shows ejection  fraction 40 to 45%, moderate left atrial enlargement, moderate RV dysfunction with mild right ventricular enlargement, mild mitral regurgitation, mild to moderate aortic insufficiency and mild to moderate aortic stenosis with mean gradient 14 mmHg.  Assessment & Plan    Acute combined systolic/diastolic congestive heart failure -patient appears to be euvolemic today.   -Continues to diurese well putting out 3L yesterday and is net neg 7L since admit -Weight down 3lbs from admit -Lasix decreased yesterday due to bump in SCR but improved today from 1.36>1 -continue Lasix to 40mg  daily -consider addition of ARB and spiro outpt but will hold off in starting at this time as we need to preserve BP to allow uptitration of AVN blocking agent.   Permanent atrial fibrillation -would like to avoid amiodarone given baseline lung disease  -Continue digoxin and Cardizem CD 240mg  daily >>LVF is mildly reduced but needs HR control and LVF will likely improve with rate control  -increase Toprol XL to 75mg  daily for better HR control as HR still in the 90-110's -Coumadin has been discontinued and she is now anticoagulated with apixaban.   -continue apixaban 5mg  BID  Cardiomyopathy with mild LV dysfunction -as outlined previously there is question whether this is related to tachycardia.   -We are adjusting AV nodal blocking agents.   -consider addition of ARB and spiro low dose outpt -will need to follow-up echocardiogram in 3 months to reassess LV function.  Mild to moderate aortic stenosis/mild to moderate aortic insufficiency -she will need follow-up echoes as an outpatient.  Severe COPD -pulmonary toilet per primary care.  I have spent a total of 30 minutes with patient reviewing 2D echo , telemetry, EKGs, labs and examining patient as well as establishing an assessment and plan that was discussed with the patient.  > 50% of time was spent in direct patient care.    For questions or updates,  please contact [facility] Please consult [email or web address] for contact info under  Signed, Armanda Magic, MD  10/12/2020, 7:52 AM

## 2020-10-12 NOTE — Progress Notes (Addendum)
[facility] Progress Note  Patient: Theresa Mendoza    ZHG:[id number]  DOA: 10/07/2020     Date of Service: the patient was seen and examined on 10/12/2020  Brief hospital course: Past medical history of PAF, on Coumadin, COPD, chronic hypoxic respiratory failure, smoker.  Presents with complaints of cough and shortness of breath.  Found to have COPD exacerbation as well as volume overload.  Needing BiPAP on admission. Currently plan is monitor for improvement in COPD exacerbation and renal stabilization.  Subjective:  -He denies nausea and vomiting today, no chest pain, continues to have shortness of breath, but appears it is improving .  Assessment and Plan:  Acute on chronic combined systolic and diastolic CHF Aggressively treated with IV diuresis.   He is currently on oral regimen, diuresing very well, so far -7 L admission. - Echocardiogram shows EF of 45%, Previous cardiogram in 2017 shows preserved EF, need follow-up echo in 3 months as an outpatient to reassess LV function. -patient denies any complaints of chest pain chest tightness or ACS. - Appreciate cardiology consult. - Has not needed BiPAP since admission.  Acute COPD exacerbation Right basilar pneumonia Continue DuoNebs. Continue steroids.  Initially started on prednisone, switch to Solu-Medrol.  Now she is back on prednisone. Continue with antibiotics.  Total 5-day treatment course.  Chronic A. fib with RVR Patient is supposed to be on Cardizem, atenolol as well as digoxin. She ran out of her digoxin. -We need to avoid amiodarone given baseline lung disease, continue with digoxin and Cardizem, -Heart rate on the higher side, so Toprol-XL has been uptitrated by cardiology to 75 mg daily. -on Full anticoagulation admission, currently transitioned to Eliquis per cardiology recommendation.  Cigarette smoker. Nicotine patch.  Patient willing to quit.   Depression. Patient on 3/24 reported that she would  like this to end.  At the time of my evaluation denies any complaints of suicidal ideation or homicidal ideation.  She has no plans to kill herself. Psychiatry consulted, no indication for inpatient psychiatric admission. Continue Seroquel.  Goals of care conversation. Extensive discussion with the patient regarding her goals of care. Currently does not want to be resuscitated or intubated.  Would be okay to use BiPAP.  Chronic pain syndrome. Patient is on Norco 10/325 5 tablets every day. We will initiate 3 times daily for now.  Diet: Cardiac diet DVT Prophylaxis:    apixaban (ELIQUIS) tablet 5 mg    Advance goals of care discussion: Limited code  Family Communication: no family was present at bedside, at the time of interview.  D/W son by phone  Disposition:  Status is: Inpatient  Remains inpatient appropriate because:IV treatments appropriate due to intensity of illness or inability to take PO  Dispo:  Patient From: Home  Planned Disposition: ALF  Medically stable for discharge: No     .  Physical Exam:  Awake Alert, Oriented X 3, No new F.N deficits, Normal affect Symmetrical Chest wall movement, Good air movement bilaterally, scattered wheezing Irregular irregular,No Gallops,Rubs or new Murmurs, No Parasternal Heave +ve B.Sounds, Abd Soft, No tenderness, No rebound - guarding or rigidity. No Cyanosis, Clubbing , lower extremities wrapped with Unna boots    Vitals:   10/12/20 0817 10/12/20 0821 10/12/20 1006 10/12/20 1154  BP:  (!) 142/78  133/87  Pulse:  99 (!) 103 (!) 102  Resp:    16  Temp:    97.8 F (36.6 C)  TempSrc:      SpO2: 93%  94%  Weight:      Height:        Intake/Output Summary (Last 24 hours) at 10/12/2020 1304 Last data filed at 10/12/2020 1594 Gross per 24 hour  Intake 733 ml  Output 2800 ml  Net -2067 ml   Filed Weights   10/10/20 0500 10/11/20 0017 10/12/20 0344  Weight: 78.7 kg 78.4 kg 77.4 kg    Data Reviewed: I have  personally reviewed and interpreted daily labs, tele strips, imaging. I reviewed all nursing notes, pharmacy notes, vitals, pertinent old records I have discussed plan of care as described above with RN and patient/family.  CBC: Recent Labs  Lab 10/07/20 1337 10/08/20 0408 10/11/20 0341  WBC 13.1* 12.8* 12.1*  HGB 14.0 13.8 13.3  HCT 44.3 41.7 41.6  MCV 91.2 87.8 88.5  PLT 421* 363 280   Basic Metabolic Panel: Recent Labs  Lab 10/07/20 1805 10/08/20 0408 10/09/20 0225 10/10/20 0401 10/11/20 0341 10/12/20 0417  NA  --  136 130* 133* 136 137  K  --  3.4* 4.7 3.9 3.8 4.8  CL  --  93* 92* 94* 96* 95*  CO2  --  33* 28 30 33* 35*  GLUCOSE  --  131* 192* 163* 203* 120*  BUN  --  22 19 21  32* 30*  CREATININE  --  1.11* 1.03* 1.07* 1.36* 1.00  CALCIUM  --  9.2 9.2 9.0 9.3 8.8*  MG 2.0  --   --   --   --   --     Studies: No results found.  Scheduled Meds: . apixaban  5 mg Oral BID  . digoxin  0.125 mg Oral Daily  . diltiazem  240 mg Oral Daily  . doxycycline  100 mg Oral Q12H  . DULoxetine  120 mg Oral Daily  . fluticasone furoate-vilanterol  1 puff Inhalation Daily  . furosemide  40 mg Oral Daily  . lidocaine  1 patch Transdermal Q24H  . [START ON 10/13/2020] metoprolol succinate  75 mg Oral Daily  . nicotine  21 mg Transdermal Daily  . pantoprazole  40 mg Oral Daily  . polyethylene glycol  17 g Oral Daily  . pravastatin  20 mg Oral Daily  . predniSONE  50 mg Oral Q breakfast  . QUEtiapine  25 mg Oral QHS  . senna-docusate  1 tablet Oral BID  . sodium chloride flush  3 mL Intravenous Q12H   Continuous Infusions: . sodium chloride     PRN Meds: sodium chloride, acetaminophen, HYDROcodone-acetaminophen, levalbuterol, metoprolol tartrate, ondansetron (ZOFRAN) IV, sodium chloride flush  Author: 10/15/2020 , MD [facility] 10/12/2020 1:04 PM  To reach On-call, see care teams to locate the attending and reach out via [email or web address]. Between 7PM-7AM,  please contact night-coverage If you still have difficulty reaching the attending provider, please page the [facility] (Director on Call) for [facility] on amion for assistance.

## 2020-10-13 ENCOUNTER — Other Ambulatory Visit: Payer: Self-pay | Admitting: Cardiology

## 2020-10-13 ENCOUNTER — Encounter (HOSPITAL_COMMUNITY): Payer: Self-pay | Admitting: Internal Medicine

## 2020-10-13 ENCOUNTER — Other Ambulatory Visit: Payer: Self-pay | Admitting: Internal Medicine

## 2020-10-13 DIAGNOSIS — I5043 Acute on chronic combined systolic (congestive) and diastolic (congestive) heart failure: Secondary | ICD-10-CM

## 2020-10-13 LAB — BASIC METABOLIC PANEL
Anion gap: 7 (ref 5–15)
BUN: 27 mg/dL — ABNORMAL HIGH (ref 8–23)
CO2: 34 mmol/L — ABNORMAL HIGH (ref 22–32)
Calcium: 9.1 mg/dL (ref 8.9–10.3)
Chloride: 96 mmol/L — ABNORMAL LOW (ref 98–111)
Creatinine, Ser: 1.05 mg/dL — ABNORMAL HIGH (ref 0.44–1.00)
GFR, Estimated: 56 mL/min — ABNORMAL LOW (ref >60)
Glucose, Bld: 119 mg/dL — ABNORMAL HIGH (ref 70–99)
Potassium: 4.8 mmol/L (ref 3.5–5.1)
Sodium: 137 mmol/L (ref 135–145)

## 2020-10-13 LAB — CBC
HCT: 43.8 % (ref 36.0–46.0)
Hemoglobin: 14.1 g/dL (ref 12.0–15.0)
MCH: 28.4 pg (ref 26.0–34.0)
MCHC: 32.2 g/dL (ref 30.0–36.0)
MCV: 88.1 fL (ref 80.0–100.0)
Platelets: 295 10*3/uL (ref 150–400)
RBC: 4.97 MIL/uL (ref 3.87–5.11)
RDW: 14.5 % (ref 11.5–15.5)
WBC: 13.1 10*3/uL — ABNORMAL HIGH (ref 4.0–10.5)
nRBC: 0 % (ref 0.0–0.2)

## 2020-10-13 LAB — DIGOXIN LEVEL: Digoxin Level: 0.9 ng/mL (ref 0.8–2.0)

## 2020-10-13 MED ORDER — APIXABAN 5 MG PO TABS: 5.0000 mg | ORAL_TABLET | Freq: Two times a day (BID) | ORAL | 0 refills | Status: DC

## 2020-10-13 MED ORDER — METOPROLOL SUCCINATE ER 25 MG PO TB24: 75.0000 mg | ORAL_TABLET | Freq: Every day | ORAL | 0 refills | Status: DC

## 2020-10-13 MED ORDER — FUROSEMIDE 40 MG PO TABS: 40.0000 mg | ORAL_TABLET | Freq: Every day | ORAL | 1 refills | Status: DC

## 2020-10-13 MED ORDER — NICOTINE 21 MG/24HR TD PT24: 21.0000 mg | MEDICATED_PATCH | Freq: Every day | TRANSDERMAL | 0 refills | Status: DC

## 2020-10-13 MED ORDER — LOSARTAN POTASSIUM 25 MG PO TABS: 25.0000 mg | ORAL_TABLET | Freq: Every day | ORAL | 11 refills | Status: DC

## 2020-10-13 MED FILL — ELIQUIS 5 MG TABLET: 5 | 30 days supply | Qty: 60 | Fill #0

## 2020-10-13 NOTE — Progress Notes (Signed)
Progress Note  Patient Name: Theresa Mendoza Date of Encounter: 10/13/2020  [facility] Cardiologist: Armanda Magic, MD   Subjective   No CP or SOB  Continues to diurese well putting out 3.9L yesterday and is net neg 9.4L since admit  Weight down 3lbs from admit  Inpatient Medications    Scheduled Meds: . apixaban  5 mg Oral BID  . digoxin  0.125 mg Oral Daily  . diltiazem  240 mg Oral Daily  . DULoxetine  120 mg Oral Daily  . fluticasone furoate-vilanterol  1 puff Inhalation Daily  . furosemide  40 mg Oral Daily  . lidocaine  1 patch Transdermal Q24H  . metoprolol succinate  75 mg Oral Daily  . nicotine  21 mg Transdermal Daily  . pantoprazole  40 mg Oral Daily  . polyethylene glycol  17 g Oral Daily  . pravastatin  20 mg Oral Daily  . [START ON 10/14/2020] predniSONE  20 mg Oral Q breakfast   Followed by  . [START ON 10/15/2020] predniSONE  10 mg Oral Q breakfast  . QUEtiapine  25 mg Oral QHS  . senna-docusate  1 tablet Oral BID  . sodium chloride flush  3 mL Intravenous Q12H   Continuous Infusions: . sodium chloride     PRN Meds: sodium chloride, acetaminophen, HYDROcodone-acetaminophen, levalbuterol, metoprolol tartrate, ondansetron (ZOFRAN) IV, sodium chloride flush   Vital Signs    Vitals:   10/13/20 0105 10/13/20 0355 10/13/20 0746 10/13/20 0810  BP:  (!) 147/71  (!) 147/65  Pulse:  93  80  Resp:  17    Temp:  97.9 F (36.6 C)    TempSrc:  Oral    SpO2:  98% 96%   Weight: 77.4 kg     Height:        Intake/Output Summary (Last 24 hours) at 10/13/2020 0851 Last data filed at 10/13/2020 3825 Gross per 24 hour  Intake 1443 ml  Output 2750 ml  Net -1307 ml   Last 3 Weights 10/13/2020 10/12/2020 10/11/2020  Weight (lbs) 170 lb 11.2 oz 170 lb 9.6 oz 172 lb 14.4 oz  Weight (kg) 77.429 kg 77.384 kg 78.427 kg  Some encounter information is confidential and restricted. Go to Review Flowsheets activity to see all data.      Telemetry  Atrial  fibrillation with CVR - Personally Reviewed  Physical Exam   GEN: Well nourished, well developed in no acute distress HEENT: Normal NECK: No JVD; No carotid bruits LYMPHATICS: No lymphadenopathy CARDIAC:irregularly irregular, no murmurs, rubs, gallops RESPIRATORY:  Clear to auscultation without rales, wheezing or rhonchi  ABDOMEN: Soft, non-tender, non-distended MUSCULOSKELETAL:  No edema; No deformity  SKIN: Warm and dry NEUROLOGIC:  Alert and oriented x 3 PSYCHIATRIC:  Normal affect    Labs    High Sensitivity Troponin:   Recent Labs  Lab 10/07/20 1337 10/07/20 1425  TROPONINIHS 28* 26*      Chemistry Recent Labs  Lab 10/07/20 1340 10/08/20 0408 10/11/20 0341 10/12/20 0417 10/13/20 0659  NA  --    < > 136 137 137  K  --    < > 3.8 4.8 4.8  CL  --    < > 96* 95* 96*  CO2  --    < > 33* 35* 34*  GLUCOSE  --    < > 203* 120* 119*  BUN  --    < > 32* 30* 27*  CREATININE  --    < > 1.36*  1.00 1.05*  CALCIUM  --    < > 9.3 8.8* 9.1  PROT 6.5  --   --   --   --   ALBUMIN 3.6  --   --   --   --   AST 17  --   --   --   --   ALT 21  --   --   --   --   ALKPHOS 65  --   --   --   --   BILITOT 0.7  --   --   --   --   GFRNONAA  --    < > 41* 59* 56*  ANIONGAP  --    < > 7 7 7    < > = values in this interval not displayed.     Hematology Recent Labs  Lab 10/08/20 0408 10/11/20 0341 10/13/20 0659  WBC 12.8* 12.1* 13.1*  RBC 4.75 4.70 4.97  HGB 13.8 13.3 14.1  HCT 41.7 41.6 43.8  MCV 87.8 88.5 88.1  MCH 29.1 28.3 28.4  MCHC 33.1 32.0 32.2  RDW 15.0 14.6 14.5  PLT 363 280 295    BNP Recent Labs  Lab 10/07/20 1338  BNP 702.4*     Radiology    No results found.  Patient Profile     75 year old female with past medical history of tobacco abuse, permanent atrial fibrillation, COPD on home oxygen, bipolar disorder admitted with acute combined systolic/diastolic congestive heart failure and atrial fibrillation with rapid ventricular response.    Echocardiogram shows ejection fraction 40 to 45%, moderate left atrial enlargement, moderate RV dysfunction with mild right ventricular enlargement, mild mitral regurgitation, mild to moderate aortic insufficiency and mild to moderate aortic stenosis with mean gradient 14 mmHg.  Assessment & Plan    Acute combined systolic/diastolic congestive heart failure -patient appears to be euvolemic today.   -Continues to diurese well putting out 4L yesterday and is net neg 9.4L since admit -Weight down 3lbs from admit -SCr remains stable at 1.05 -continue Lasix to 40mg  daily -BP elevated today so will add on Losartan 25mg  daily -consider addition of spiro as outpt -repeat 2D echo in 2 months to reassess LVF with adequate HR control  Permanent atrial fibrillation -would like to avoid amiodarone given baseline lung disease  -Continue digoxin and Cardizem CD 240mg  daily >>LVF is mildly reduced but needs HR control and LVF will likely improve with rate control  -HR much improved in the 70-90's -continue apixaban 5mg  BID and Toprol XL 75mg  daily  Cardiomyopathy with mild LV dysfunction -as outlined previously there is question whether this is related to tachycardia.   -HR controlled on exam today on Toprol XL 75mg  daily -will add Losartan 25mg  daily and consider addition of spiro as outpt -will need to follow-up echocardiogram in 2 months to reassess LV function.  Mild to moderate aortic stenosis/mild to moderate aortic insufficiency -she will need follow-up echoes as an outpatient.  Severe COPD -pulmonary toilet per primary care.  [facility] will sign off.   Medication Recommendations:  Toprol XL 75mg  daily, Digoxin 0.125mg  daily, Cardizem CD 240mg  daily, Losartan 25mg  daily, Eliquis 5mg  BID, Lasix 40mg  daily Other recommendations (labs, testing, etc):  Dig level and BMET in 1 week Follow up as an outpatient:  7-10 days with Dr. Mayford Knife or extender  I have spent a total of 30  minutes with patient reviewing 2D echo , telemetry, EKGs, labs and examining patient as well as establishing  an assessment and plan that was discussed with the patient.  > 50% of time was spent in direct patient care.    For questions or updates, please contact [facility] Please consult [email or web address] for contact info under        Signed, Armanda Magic, MD  10/13/2020, 8:51 AM

## 2020-10-13 NOTE — Progress Notes (Signed)
Heart Failure Stewardship Pharmacist Progress Note   PCP: Dr. Hyman Hopes PCP-Cardiologist: Armanda Magic, MD    HPI:  75 yo F with PMH of tobacco abuse, permanent atrial fibrillation, COPD, and bipolar disorder. She presented to the ED on 10/07/20 with shortness of breath and afib with RVR. An ECHO was done on 10/08/20 and LVEF was 40-45% (down from 60-65% in July 2017). Stress test completed in July 2017 and was found to be low risk.   Current HF Medications: Furosemide 40 mg daily Metoprolol XL 75 mg daily Digoxin 0.125 mg daily  Prior to admission HF Medications: Furosemide 40 mg BID Digoxin 0.125 mg daily  Pertinent Lab Values: . Serum creatinine 1.05, BUN 27, Potassium 4.8, Sodium 137, BNP 702.4, Digoxin 0.9 on 3/30   Vital Signs: . Weight: 170 lbs (admission weight: 173 lbs) . Blood pressure: 140/70s  . Heart rate: 80s  Medication Assistance / Insurance Benefits Check: Does the patient have prescription insurance?  Yes Type of insurance plan: Medicare  Does the patient qualify for medication assistance through manufacturers or grants?   Pending . Eligible grants and/or patient assistance programs: pending . Medication assistance applications in progress: none  . Medication assistance applications approved: none Approved medication assistance renewals will be completed by: pending  Outpatient Pharmacy:  Prior to admission outpatient [facility] Is the patient willing to use [facility] TOC pharmacy at discharge? Yes Is the patient willing to transition their outpatient pharmacy to utilize a [facility] outpatient pharmacy?   Pending    Assessment: 1. Acute on chronic systolic CHF (EF 17-61%), due to presumed NICM. NYHA class II symptoms. - Continue furosemide 40 mg daily - Continue metoprolol XL 75 mg daily - Recommend starting losartan 25 mg daily at discharge - Continue digoxin 0.125 mg daily   Plan: 1) Medication changes recommended at this time: - Start losartan 25 mg  daily  2) Patient assistance: - None pending  3)  Education  - Patient has been educated on current HF medications and potential additions to HF medication regimen - Patient verbalizes understanding that over the next few months, these medication doses may change and more medications may be added to optimize HF regimen - Patient has been educated on basic disease state pathophysiology and goals of therapy - Time spent (15 mins)   Sharen Hones, PharmD, BCPS Heart Failure Stewardship Pharmacist Phone [phone]

## 2020-10-13 NOTE — TOC Transition Note (Signed)
Transition of Care [facility]) - CM/SW Discharge Note   Patient Details  Name: Theresa Mendoza MRN: [id number] Date of Birth: Aug 04, 1945  [facility] CM/SW Contact:  Leone Haven, RN Phone Number: 10/13/2020, 12:11 PM   Clinical Narrative:    Patient is for dc today, NCM notified Kandee Keen with [facility] . Patient is set with them for [facility], HHPT.  Soc will begin 24 to 48 hrs post dc.   Final next level of care: Home w Home Health Services Barriers to Discharge: No Barriers Identified   Patient Goals and CMS Choice Patient states their goals for this hospitalization and ongoing recovery are:: return home taken CMS Medicare.gov Compare Post Acute Care list provided to:: Patient Choice offered to / list presented to : Patient  Discharge Placement                       Discharge Plan and Services                  DME Agency: NA       HH Arranged: RN,PT HH Agency: [facility] Date Outpatient Carecenter Agency Contacted: 10/09/20 Time HH Agency Contacted: [phone] Representative spoke with at [facility] Agency: Kandee Keen  Social Determinants of Health (SDOH) Interventions Food Insecurity Interventions: Intervention Not Indicated Financial Strain Interventions: Intervention Not Indicated Housing Interventions: Intervention Not Indicated Physical Activity Interventions: Intervention Not Indicated Transportation Interventions: Intervention Not Indicated Alcohol Brief Interventions/Follow-up: AUDIT Score <7 follow-up not indicated   Readmission Risk Interventions No flowsheet data found.

## 2020-10-13 NOTE — Discharge Summary (Signed)
Physician Discharge Summary  Theresa Mendoza ZOX:[id number] DOB: 1946/03/17 DOA: 10/07/2020  PCP: Pcp, No  Admit date: 10/07/2020 Discharge date: 10/13/2020  Admitted From: Home Disposition: Home with home health  Recommendations for Outpatient Follow-up:  1. Follow up with PCP in 1-2 weeks 2. Please obtain BMP/CBC in one week 3. You have a scheduled follow-up with cardiology next month. 4. There are plenty of changes on your medication regimen, take new medicines.  Home Health: PT/RN Equipment/Devices: Oxygen already present at home  Discharge Condition: Stable CODE STATUS: Partial, full code for resuscitation purposes Diet recommendation: Low-salt diet  Discharge summary: Patient with history of paroxysmal A. fib on Coumadin, COPD with chronic hypoxic respiratory failure on 2 to 3 L of oxygen at home, ongoing smoker presented to the hospital with complaints of cough and shortness of breath.  She was found to be with COPD exacerbation as well as volume overloaded.  She was seen and followed by cardiology and managed for acute on chronic combined heart failure.  Remained in the hospital, optimized and now going home with following plan of care.  Acute on chronic combined systolic and diastolic congestive heart failure: Aggressively treated with IV diuresis.  7 L negative balance.  Echocardiogram with ejection fraction 45%.  Previous echocardiogram in 2017 with preserved EF.  Appropriately improved and symptoms controlled.  Acute COPD exacerbation, right basilar pneumonia: She was treated with short course of antibiotics and steroids that she finishes.  Chronic A. fib with RVR: Patient was on Cardizem, atenolol and digoxin.  She had ran out of digoxin.  Amiodarone avoided given his baseline lung disease.  She is currently sinus rhythm and rate controlled on Toprol-XL, Coumadin was stopped and started on Eliquis that she is tolerating well.  Patient is medically stabilized.  She is  going home today with Toprol-XL 75 mg daily, digoxin 0.125 mg daily, Cardizem 240 mg daily, losartan 25 mg daily started, Eliquis 5 mg twice daily and Lasix 40 mg daily.  She has a scheduled follow-up with cardiology.  Patient is deconditioned and will benefit with home health PT, prescribed.  She will also benefit with home health RN. Patient was motivated to quit his smoking, nicotine patch prescribed. Patient does have oxygen available at home, she will use it as needed.    Discharge Diagnoses:  Active Problems:   CHF (congestive heart failure) (HCC)   Major depressive disorder, recurrent episode, moderate (HCC)   Acute on chronic heart failure (HCC)   Atrial fibrillation with RVR (HCC)   Primary hypertension    Discharge Instructions  Discharge Instructions    Call MD for:  difficulty breathing, headache or visual disturbances   Complete by: As directed    Call MD for:  extreme fatigue   Complete by: As directed    Diet - low sodium heart healthy   Complete by: As directed    Increase activity slowly   Complete by: As directed      Allergies as of 10/13/2020      Reactions   Lithium    Patient became suicidal   Tegretol [carbamazepine]    UNK reaction      Medication List    STOP taking these medications   atenolol 25 MG tablet Commonly known as: TENORMIN   warfarin 5 MG tablet Commonly known as: COUMADIN     TAKE these medications   albuterol 108 (90 Base) MCG/ACT inhaler Commonly known as: VENTOLIN HFA Inhale 2 puffs into the lungs every 6 (  six) hours as needed for wheezing or shortness of breath. Notes to patient: For shortness of breath   apixaban 5 MG Tabs tablet Commonly known as: ELIQUIS Take 1 tablet (5 mg total) by mouth 2 (two) times daily.   digoxin 0.125 MG tablet Commonly known as: LANOXIN Take 1 tablet (0.125 mg total) by mouth daily. Please make overdue appt with Dr. Mayford Knife before anymore refills. 2nd attempt Notes to patient: To lower  heart rate and prevent heart failure events   diltiazem 240 MG 24 hr capsule Commonly known as: CARDIZEM CD Take 240 mg by mouth daily. Notes to patient: To lower heart rate   DULoxetine 60 MG capsule Commonly known as: CYMBALTA TAKE 2 CAPSULES BY MOUTH EVERY DAY Notes to patient: To improve mood    furosemide 40 MG tablet Commonly known as: LASIX Take 1 tablet (40 mg total) by mouth daily. What changed: when to take this Notes to patient: **DECREASED** To remove extra fluid   HYDROcodone-acetaminophen 10-325 MG tablet Commonly known as: NORCO Take 1 tablet by mouth 5 (five) times daily as needed for moderate pain or severe pain. Notes to patient: For severe pain. May cause drowsiness, dizziness and constipation. May use over-the-counter docusate, miralax or senna for constipation   losartan 25 MG tablet Commonly known as: Cozaar Take 1 tablet (25 mg total) by mouth daily. Notes to patient: **NEW** To lower blood pressure and protect kidneys   metoprolol succinate 25 MG 24 hr tablet Commonly known as: TOPROL-XL Take 3 tablets (75 mg total) by mouth daily. Start taking on: October 14, 2020 Notes to patient: **NEW** To lower heart rate   nicotine 21 mg/24hr patch Commonly known as: NICODERM CQ - dosed in mg/24 hours Place 1 patch (21 mg total) onto the skin daily. Start taking on: October 14, 2020 Notes to patient: **NEW** To help quit smoking   omeprazole 40 MG capsule Commonly known as: PRILOSEC Take 40 mg by mouth in the morning. Before breakfast Notes to patient: To lower stomach acid   pravastatin 20 MG tablet Commonly known as: PRAVACHOL Take 20 mg by mouth daily. Notes to patient: To lower cholesterol       Follow-up Information    [facility], [facility] Follow up.   Specialty: Home Health Services Why: HHRN, HHPT Contact information: [address] [place] [address] [phone]        [facility]. Go on 10/19/2020.   Specialty: Cardiology Why: AT 3PM. HEART [facility]ACT ([facility] TOC) [facility] FREE valet parking at [employer], off [address]. Bring all your medicaitons/pill box with you.  You will see a HF physician, pharmacist and nurse, this appt is about 45 minutes. Contact information: [address] [id number] [place] [address] [phone]             Allergies  Allergen Reactions  . Lithium     Patient became suicidal  . Tegretol [Carbamazepine]     UNK reaction    Consultations:  Cardiology   Procedures/Studies: DG Chest 2 View  Result Date: 10/07/2020 CLINICAL DATA:  Shortness of breath. EXAM: CHEST - 2 VIEW COMPARISON:  08/24/2016 FINDINGS: Two-view exam shows right base airspace disease, suspicious for pneumonia. There is a small right pleural effusion. Left lung clear. Interstitial markings are diffusely coarsened with chronic features. Cardiopericardial silhouette is at upper limits of normal for size. The visualized bony structures of the thorax show  no acute abnormality. IMPRESSION: Right base airspace disease suspicious for pneumonia with small right pleural effusion. Follow-up imaging recommended to ensure complete resolution. Electronically Signed   By: Kennith Center M.D.   On: 10/07/2020 13:36   ECHOCARDIOGRAM COMPLETE  Result Date: 10/08/2020    ECHOCARDIOGRAM REPORT   Patient Name:   MURIAL BEAM Date of Exam: 10/08/2020 Medical Rec #:  [id number]        Height:       65.0 in Accession #:    [id number]       Weight:       172.8 lb Date of Birth:  01-14-1946       BSA:          1.859 m Patient Age:    74 years         BP:           113/79 mmHg Patient Gender: F                HR:           74 bpm. Exam Location:  Inpatient Procedure: 2D Echo, Cardiac Doppler and Color Doppler Indications:    Stroke  History:        Patient has no prior history of Echocardiogram examinations.  Sonographer:    Roosvelt Maser  RDCS Referring Phys: [id number] Emeline General IMPRESSIONS  1. Left ventricular ejection fraction, by estimation, is 40 to 45%. The left ventricle has mildly decreased function. The left ventricle demonstrates global hypokinesis. Left ventricular diastolic function could not be evaluated. There is global left ventricular hypokinesis, but there appears to be disproportionate hypokinesis of the inferior wall and inferior septum.  2. Right ventricular systolic function is moderately reduced. The right ventricular size is mildly enlarged. There is mildly elevated pulmonary artery systolic pressure.  3. Left atrial size was moderately dilated.  4. The mitral valve is normal in structure. Mild mitral valve regurgitation. No evidence of mitral stenosis.  5. The aortic valve is tricuspid. There is moderate calcification of the aortic valve. There is moderate thickening of the aortic valve. Aortic valve regurgitation is mild to moderate. Mild to moderate aortic valve stenosis. Aortic valve mean gradient measures 14.5 mmHg. Aortic valve Vmax measures 2.46 m/s.  6. The inferior vena cava is dilated in size with <50% respiratory variability, suggesting right atrial pressure of 15 mmHg. Conclusion(s)/Recommendation(s): Atrial fibrillation throughout the study. FINDINGS  Left Ventricle: Left ventricular ejection fraction, by estimation, is 40 to 45%. The left ventricle has mildly decreased function. The left ventricle demonstrates global hypokinesis. The left ventricular internal cavity size was normal in size. There is  no left ventricular hypertrophy. Left ventricular diastolic function could not be evaluated due to atrial fibrillation. Left ventricular diastolic function could not be evaluated.  LV Wall Scoring: There is global left ventricular hypokinesis, but there appears to be disproportionate hypokinesis of the inferior wall and inferior septum. Right Ventricle: The right ventricular size is mildly enlarged. No increase in  right ventricular wall thickness. Right ventricular systolic function is moderately reduced. There is mildly elevated pulmonary artery systolic pressure. The tricuspid regurgitant velocity is 2.70 m/s, and with an assumed right atrial pressure of 15 mmHg, the estimated right ventricular systolic pressure is 44.2 mmHg. Left Atrium: Left atrial size was moderately dilated. Right Atrium: Right atrial size was normal in size. Pericardium: There is no evidence of pericardial effusion. Mitral Valve: The mitral valve is normal in structure. There is mild thickening of  the mitral valve leaflet(s). Mild to moderate mitral annular calcification. Mild mitral valve regurgitation. No evidence of mitral valve stenosis. Tricuspid Valve: The tricuspid valve is normal in structure. Tricuspid valve regurgitation is not demonstrated. No evidence of tricuspid stenosis. Aortic Valve: The aortic valve is tricuspid. There is moderate calcification of the aortic valve. There is moderate thickening of the aortic valve. Aortic valve regurgitation is mild to moderate. Mild to moderate aortic stenosis is present. Aortic valve mean gradient measures 14.5 mmHg. Aortic valve peak gradient measures 24.1 mmHg. Aortic valve area, by VTI measures 1.19 cm. Pulmonic Valve: The pulmonic valve was normal in structure. Pulmonic valve regurgitation is not visualized. No evidence of pulmonic stenosis. Aorta: The aortic root is normal in size and structure. Venous: The inferior vena cava is dilated in size with less than 50% respiratory variability, suggesting right atrial pressure of 15 mmHg. IAS/Shunts: No atrial level shunt detected by color flow Doppler.  LEFT VENTRICLE PLAX 2D LVIDd:         4.30 cm      Diastology LVIDs:         3.10 cm      LV e' medial:  5.87 cm/s LV PW:         1.20 cm      LV e' lateral: 3.92 cm/s LV IVS:        1.10 cm LVOT diam:     1.86 cm LV SV:         56 LV SV Index:   30 LVOT Area:     2.72 cm  LV Volumes (MOD) LV vol d,  MOD A2C: 95.1 ml LV vol d, MOD A4C: 105.0 ml LV vol s, MOD A2C: 55.1 ml LV vol s, MOD A4C: 47.8 ml LV SV MOD A2C:     40.0 ml LV SV MOD A4C:     105.0 ml LV SV MOD BP:      47.6 ml RIGHT VENTRICLE          IVC RV Basal diam:  3.30 cm  IVC diam: 2.10 cm LEFT ATRIUM             Index       RIGHT ATRIUM           Index LA diam:        4.70 cm 2.53 cm/m  RA Area:     16.90 cm LA Vol (A2C):   80.4 ml 43.25 ml/m RA Volume:   46.10 ml  24.80 ml/m LA Vol (A4C):   38.0 ml 20.44 ml/m LA Biplane Vol: 59.9 ml 32.22 ml/m  AORTIC VALVE AV Area (Vmax):    1.37 cm AV Area (Vmean):   1.23 cm AV Area (VTI):     1.19 cm AV Vmax:           245.50 cm/s AV Vmean:          177.000 cm/s AV VTI:            0.470 m AV Peak Grad:      24.1 mmHg AV Mean Grad:      14.5 mmHg LVOT Vmax:         124.00 cm/s LVOT Vmean:        80.200 cm/s LVOT VTI:          0.205 m LVOT/AV VTI ratio: 0.44  AORTA Ao Root diam: 2.30 cm Ao Asc diam:  2.90 cm TRICUSPID VALVE TR Peak grad:   29.2 mmHg TR  Vmax:        270.00 cm/s  SHUNTS Systemic VTI:  0.20 m Systemic Diam: 1.86 cm Rachelle Hora Croitoru MD Electronically signed by Thurmon Fair MD Signature Date/Time: 10/08/2020/1:42:32 PM    Final    VAS [place] LOWER EXTREMITY VENOUS (DVT)  Result Date: 10/08/2020  Lower Venous DVT Study Indications: Swelling.  Comparison Study: No prior studies. Performing Technologist: Jean Rosenthal RDMS,RVT  Examination Guidelines: A complete evaluation includes B-mode imaging, spectral Doppler, color Doppler, and power Doppler as needed of all accessible portions of each vessel. Bilateral testing is considered an integral part of a complete examination. Limited examinations for reoccurring indications may be performed as noted. The reflux portion of the exam is performed with the patient in reverse Trendelenburg.  +---------+---------------+---------+-----------+----------+--------------+ RIGHT    CompressibilityPhasicitySpontaneityPropertiesThrombus Aging  +---------+---------------+---------+-----------+----------+--------------+ CFV      Full           Yes      Yes                                 +---------+---------------+---------+-----------+----------+--------------+ SFJ      Full                                                        +---------+---------------+---------+-----------+----------+--------------+ FV Prox  Full                                                        +---------+---------------+---------+-----------+----------+--------------+ FV Mid   Full                                                        +---------+---------------+---------+-----------+----------+--------------+ FV DistalFull                                                        +---------+---------------+---------+-----------+----------+--------------+ PFV      Full                                                        +---------+---------------+---------+-----------+----------+--------------+ POP      Full           Yes      Yes                                 +---------+---------------+---------+-----------+----------+--------------+ PTV      Full                                                        +---------+---------------+---------+-----------+----------+--------------+  PERO     Full                                                        +---------+---------------+---------+-----------+----------+--------------+   +---------+---------------+---------+-----------+----------+--------------+ LEFT     CompressibilityPhasicitySpontaneityPropertiesThrombus Aging +---------+---------------+---------+-----------+----------+--------------+ CFV      Full           Yes      Yes                                 +---------+---------------+---------+-----------+----------+--------------+ SFJ      Full                                                         +---------+---------------+---------+-----------+----------+--------------+ FV Prox  Full                                                        +---------+---------------+---------+-----------+----------+--------------+ FV Mid   Full                                                        +---------+---------------+---------+-----------+----------+--------------+ FV DistalFull                                                        +---------+---------------+---------+-----------+----------+--------------+ PFV      Full                                                        +---------+---------------+---------+-----------+----------+--------------+ POP      Full           Yes      Yes                                 +---------+---------------+---------+-----------+----------+--------------+ PTV      Full                                                        +---------+---------------+---------+-----------+----------+--------------+ PERO     Full                                                        +---------+---------------+---------+-----------+----------+--------------+  Summary: RIGHT: - There is no evidence of deep vein thrombosis in the lower extremity.  - No cystic structure found in the popliteal fossa.  LEFT: - There is no evidence of deep vein thrombosis in the lower extremity.  - No cystic structure found in the popliteal fossa.  *See table(s) above for measurements and observations. Electronically signed by Heath Lark on 10/08/2020 at 6:55:32 PM.    Final    (Echo, Carotid, EGD, Colonoscopy, ERCP)    Subjective: Patient seen and examined.  Her son was on the phone.  Patient is looking forward to go home.  Denies any chest pain or shortness of breath.  Telemetry shows sinus rhythm.  On 1 L oxygen at rest.   Discharge Exam: Vitals:   10/13/20 0746 10/13/20 0810  BP:  (!) 147/65  Pulse:  80  Resp:    Temp:    SpO2: 96%    Vitals:    10/13/20 0105 10/13/20 0355 10/13/20 0746 10/13/20 0810  BP:  (!) 147/71  (!) 147/65  Pulse:  93  80  Resp:  17    Temp:  97.9 F (36.6 C)    TempSrc:  Oral    SpO2:  98% 96%   Weight: 77.4 kg     Height:        General: Pt is alert, awake, not in acute distress Looks comfortable. Cardiovascular: RRR, S1/S2 +, no rubs, no gallops Respiratory: CTA bilaterally, no wheezing, no rhonchi Abdominal: Soft, NT, ND, bowel sounds + Extremities: Both legs are wrapped in compression stockings.  No edema.    The results of significant diagnostics from this hospitalization (including imaging, microbiology, ancillary and laboratory) are listed below for reference.     Microbiology: Recent Results (from the past 240 hour(s))  Resp Panel by RT-PCR (Flu A&B, Covid) Nasopharyngeal Swab     Status: None   Collection Time: 10/07/20  1:32 PM   Specimen: Nasopharyngeal Swab; Nasopharyngeal(NP) swabs in vial transport medium  Result Value Ref Range Status   SARS Coronavirus 2 by RT PCR NEGATIVE NEGATIVE Final    Comment: (NOTE) SARS-CoV-2 target nucleic acids are NOT DETECTED.  The SARS-CoV-2 RNA is generally detectable in upper respiratory specimens during the acute phase of infection. The lowest concentration of SARS-CoV-2 viral copies this assay can detect is 138 copies/mL. A negative result does not preclude SARS-Cov-2 infection and should not be used as the sole basis for treatment or other patient management decisions. A negative result may occur with  improper specimen collection/handling, submission of specimen other than nasopharyngeal swab, presence of viral mutation(s) within the areas targeted by this assay, and inadequate number of viral copies(<138 copies/mL). A negative result must be combined with clinical observations, patient history, and epidemiological information. The expected result is Negative.  Fact Sheet for Patients:  BloggerCourse.com  Fact  Sheet for Healthcare Providers:  SeriousBroker.it  This test is no t yet approved or cleared by the [place] FDA and  has been authorized for detection and/or diagnosis of SARS-CoV-2 by FDA under an Emergency Use Authorization (EUA). This EUA will remain  in effect (meaning this test can be used) for the duration of the COVID-19 declaration under Section 564(b)(1) of the Act, 21 U.S.C.section 360bbb-3(b)(1), unless the authorization is terminated  or revoked sooner.       Influenza A by PCR NEGATIVE NEGATIVE Final   Influenza B by PCR NEGATIVE NEGATIVE Final    Comment: (NOTE) The Xpert Xpress SARS-CoV-2/FLU/RSV plus assay is  intended as an aid in the diagnosis of influenza from Nasopharyngeal swab specimens and should not be used as a sole basis for treatment. Nasal washings and aspirates are unacceptable for Xpert Xpress SARS-CoV-2/FLU/RSV testing.  Fact Sheet for Patients: BloggerCourse.com  Fact Sheet for Healthcare Providers: SeriousBroker.it  This test is not yet approved or cleared by the [place] FDA and has been authorized for detection and/or diagnosis of SARS-CoV-2 by FDA under an Emergency Use Authorization (EUA). This EUA will remain in effect (meaning this test can be used) for the duration of the COVID-19 declaration under Section 564(b)(1) of the Act, 21 U.S.C. section 360bbb-3(b)(1), unless the authorization is terminated or revoked.  Performed at [facility], [address] [place], [place] [address]      Labs: BNP (last 3 results) Recent Labs    10/07/20 1338  BNP 702.4*   Basic Metabolic Panel: Recent Labs  Lab 10/07/20 1805 10/08/20 0408 10/09/20 0225 10/10/20 0401 10/11/20 0341 10/12/20 0417 10/13/20 0659  NA  --    < > 130* 133* 136 137 137  K  --    < > 4.7 3.9 3.8 4.8 4.8  CL  --    < > 92* 94* 96* 95* 96*  CO2  --    < > 28 30 33* 35* 34*   GLUCOSE  --    < > 192* 163* 203* 120* 119*  BUN  --    < > 19 21 32* 30* 27*  CREATININE  --    < > 1.03* 1.07* 1.36* 1.00 1.05*  CALCIUM  --    < > 9.2 9.0 9.3 8.8* 9.1  MG 2.0  --   --   --   --   --   --    < > = values in this interval not displayed.   Liver Function Tests: Recent Labs  Lab 10/07/20 1340  AST 17  ALT 21  ALKPHOS 65  BILITOT 0.7  PROT 6.5  ALBUMIN 3.6   No results for input(s): LIPASE, AMYLASE in the last 168 hours. No results for input(s): AMMONIA in the last 168 hours. CBC: Recent Labs  Lab 10/07/20 1337 10/08/20 0408 10/11/20 0341 10/13/20 0659  WBC 13.1* 12.8* 12.1* 13.1*  HGB 14.0 13.8 13.3 14.1  HCT 44.3 41.7 41.6 43.8  MCV 91.2 87.8 88.5 88.1  PLT 421* 363 280 295   Cardiac Enzymes: No results for input(s): CKTOTAL, CKMB, CKMBINDEX, TROPONINI in the last 168 hours. BNP: Invalid input(s): POCBNP CBG: No results for input(s): GLUCAP in the last 168 hours. D-Dimer No results for input(s): DDIMER in the last 72 hours. Hgb A1c No results for input(s): HGBA1C in the last 72 hours. Lipid Profile No results for input(s): CHOL, HDL, LDLCALC, TRIG, CHOLHDL, LDLDIRECT in the last 72 hours. Thyroid function studies No results for input(s): TSH, T4TOTAL, T3FREE, THYROIDAB in the last 72 hours.  Invalid input(s): FREET3 Anemia work up No results for input(s): VITAMINB12, FOLATE, FERRITIN, TIBC, IRON, RETICCTPCT in the last 72 hours. Urinalysis No results found for: COLORURINE, APPEARANCEUR, LABSPEC, PHURINE, GLUCOSEU, HGBUR, BILIRUBINUR, KETONESUR, PROTEINUR, UROBILINOGEN, NITRITE, LEUKOCYTESUR Sepsis Labs Invalid input(s): PROCALCITONIN,  WBC,  LACTICIDVEN Microbiology Recent Results (from the past 240 hour(s))  Resp Panel by RT-PCR (Flu A&B, Covid) Nasopharyngeal Swab     Status: None   Collection Time: 10/07/20  1:32 PM   Specimen: Nasopharyngeal Swab; Nasopharyngeal(NP) swabs in vial transport medium  Result Value Ref Range Status  SARS Coronavirus 2 by RT PCR NEGATIVE NEGATIVE Final    Comment: (NOTE) SARS-CoV-2 target nucleic acids are NOT DETECTED.  The SARS-CoV-2 RNA is generally detectable in upper respiratory specimens during the acute phase of infection. The lowest concentration of SARS-CoV-2 viral copies this assay can detect is 138 copies/mL. A negative result does not preclude SARS-Cov-2 infection and should not be used as the sole basis for treatment or other patient management decisions. A negative result may occur with  improper specimen collection/handling, submission of specimen other than nasopharyngeal swab, presence of viral mutation(s) within the areas targeted by this assay, and inadequate number of viral copies(<138 copies/mL). A negative result must be combined with clinical observations, patient history, and epidemiological information. The expected result is Negative.  Fact Sheet for Patients:  BloggerCourse.com  Fact Sheet for Healthcare Providers:  SeriousBroker.it  This test is no t yet approved or cleared by the [place] FDA and  has been authorized for detection and/or diagnosis of SARS-CoV-2 by FDA under an Emergency Use Authorization (EUA). This EUA will remain  in effect (meaning this test can be used) for the duration of the COVID-19 declaration under Section 564(b)(1) of the Act, 21 U.S.C.section 360bbb-3(b)(1), unless the authorization is terminated  or revoked sooner.       Influenza A by PCR NEGATIVE NEGATIVE Final   Influenza B by PCR NEGATIVE NEGATIVE Final    Comment: (NOTE) The Xpert Xpress SARS-CoV-2/FLU/RSV plus assay is intended as an aid in the diagnosis of influenza from Nasopharyngeal swab specimens and should not be used as a sole basis for treatment. Nasal washings and aspirates are unacceptable for Xpert Xpress SARS-CoV-2/FLU/RSV testing.  Fact Sheet for  Patients: BloggerCourse.com  Fact Sheet for Healthcare Providers: SeriousBroker.it  This test is not yet approved or cleared by the [place] FDA and has been authorized for detection and/or diagnosis of SARS-CoV-2 by FDA under an Emergency Use Authorization (EUA). This EUA will remain in effect (meaning this test can be used) for the duration of the COVID-19 declaration under Section 564(b)(1) of the Act, 21 U.S.C. section 360bbb-3(b)(1), unless the authorization is terminated or revoked.  Performed at [facility], [address], [place], [place] [address]      Time coordinating discharge:  35 minutes  SIGNED:   Dorcas Carrow, MD  [facility] 10/13/2020, 1:16 PM

## 2020-10-13 NOTE — Plan of Care (Signed)

## 2020-10-13 NOTE — Plan of Care (Signed)

## 2020-10-13 NOTE — Progress Notes (Signed)
Physical Therapy Treatment Patient Details Name: Theresa Mendoza MRN: [id number] DOB: [birth year] Today's Date: 10/13/2020    History of Present Illness 75 y.o. female presented to [facility] ED on 10/07/2020 with increasing SOB and LE swelling. Pt lost follow-up with cardiology since pandemic onset. Pt found to have fluid overload and suspected RLL PNA. PMH includes PAF, COPD Gold stage II with chronic hypoxic respiratory failure.    PT Comments    Pt with improved tolerance for mobility and reduced supplemental oxygen needs. Pt is able to ambulate for increased distances without significant reports of SOB. Pt seems to recover quickly when resting as well. Pt will benefit from continued aggressive mobilization to continue improving activity tolerance.   Follow Up Recommendations   (outpatient cardiopulmonary rehab)     Equipment Recommendations  None recommended by PT    Recommendations for Other Services       Precautions / Restrictions Precautions Precautions: Fall;Other (comment) Precaution Comments: monitor SpO2 Restrictions Weight Bearing Restrictions: No    Mobility  Bed Mobility Overal bed mobility: Modified Independent             General bed mobility comments: HOB elevated    Transfers Overall transfer level: Needs assistance Equipment used: None Transfers: Sit to/from Stand Sit to Stand: Supervision            Ambulation/Gait Ambulation/Gait assistance: Supervision Gait Distance (Feet): 150 Feet Assistive device: None Gait Pattern/deviations: Step-through pattern Gait velocity: reduced Gait velocity interpretation: <1.8 ft/sec, indicate of risk for recurrent falls General Gait Details: pt with steady step-through gait, reduced gait speed   Stairs             Wheelchair Mobility    Modified Rankin (Stroke Patients Only)       Balance Overall balance assessment: Needs assistance Sitting-balance support: No upper extremity  supported;Feet supported Sitting balance-Leahy Scale: Good     Standing balance support: No upper extremity supported;During functional activity Standing balance-Leahy Scale: Fair                              Cognition Arousal/Alertness: Awake/alert Behavior During Therapy: WFL for tasks assessed/performed Overall Cognitive Status: Impaired/Different from baseline Area of Impairment: Memory                     Memory:  (poor recall of oxygen requirements at baseline)                Exercises      General Comments General comments (skin integrity, edema, etc.): pt ambulated on 2L [place] with stable sats in low 90s      Pertinent Vitals/Pain Pain Assessment: No/denies pain    Home Living                      Prior Function            PT Goals (current goals can now be found in the care plan section) Acute Rehab PT Goals Patient Stated Goal: to go home Progress towards PT goals: Progressing toward goals    Frequency    Min 3X/week      PT Plan Current plan remains appropriate    Co-evaluation              AM-PAC PT "6 Clicks" Mobility   Outcome Measure  Help needed turning from your back to your side while in a flat bed without using  bedrails?: None Help needed moving from lying on your back to sitting on the side of a flat bed without using bedrails?: None Help needed moving to and from a bed to a chair (including a wheelchair)?: A Little Help needed standing up from a chair using your arms (e.g., wheelchair or bedside chair)?: A Little Help needed to walk in hospital room?: A Little Help needed climbing 3-5 steps with a railing? : A Little 6 Click Score: 20    End of Session Equipment Utilized During Treatment: Oxygen Activity Tolerance: Patient tolerated treatment well Patient left: in bed;with call bell/phone within reach Nurse Communication: Mobility status PT Visit Diagnosis: Muscle weakness (generalized)  (M62.81);Pain     Time: 8850-2774 PT Time Calculation (min) (ACUTE ONLY): 13 min  Charges:  $Gait Training: 8-22 mins                     Arlyss Gandy, PT, DPT Acute Rehabilitation Pager: [phone]    Arlyss Gandy 10/13/2020, 10:31 AM

## 2020-10-13 NOTE — Progress Notes (Signed)
D/C instructions given and reviewed. Tele and IV removed, tolerated well. Pt and son had questions about HH, will refer to case manager. Also will request MD to send eliquis to [facility] pharmacy for fill.

## 2020-10-19 ENCOUNTER — Encounter (HOSPITAL_COMMUNITY): Payer: Self-pay

## 2020-10-19 ENCOUNTER — Other Ambulatory Visit: Payer: Self-pay

## 2020-10-19 ENCOUNTER — Other Ambulatory Visit (HOSPITAL_COMMUNITY): Payer: Self-pay

## 2020-10-19 ENCOUNTER — Ambulatory Visit (HOSPITAL_COMMUNITY)
Admit: 2020-10-19 | Discharge: 2020-10-19 | Disposition: A | Discharge status: Home / Self Care | Payer: Medicare Other | Source: Ambulatory Visit | Attending: Internal Medicine | Admitting: Internal Medicine

## 2020-10-19 VITALS — BP 130/60 | HR 78 | Wt 161.8 lb

## 2020-10-19 DIAGNOSIS — I4821 Permanent atrial fibrillation: Secondary | ICD-10-CM | POA: Diagnosis not present

## 2020-10-19 DIAGNOSIS — R531 Weakness: Secondary | ICD-10-CM | POA: Insufficient documentation

## 2020-10-19 DIAGNOSIS — I5042 Chronic combined systolic (congestive) and diastolic (congestive) heart failure: Secondary | ICD-10-CM | POA: Diagnosis not present

## 2020-10-19 DIAGNOSIS — Z87891 Personal history of nicotine dependence: Secondary | ICD-10-CM | POA: Insufficient documentation

## 2020-10-19 DIAGNOSIS — Z79899 Other long term (current) drug therapy: Secondary | ICD-10-CM | POA: Insufficient documentation

## 2020-10-19 DIAGNOSIS — Z7901 Long term (current) use of anticoagulants: Secondary | ICD-10-CM | POA: Diagnosis not present

## 2020-10-19 LAB — BASIC METABOLIC PANEL
Anion gap: 7 (ref 5–15)
BUN: 27 mg/dL — ABNORMAL HIGH (ref 8–23)
CO2: 32 mmol/L (ref 22–32)
Calcium: 9.6 mg/dL (ref 8.9–10.3)
Chloride: 103 mmol/L (ref 98–111)
Creatinine, Ser: 1.24 mg/dL — ABNORMAL HIGH (ref 0.44–1.00)
GFR, Estimated: 46 mL/min — ABNORMAL LOW (ref >60)
Glucose, Bld: 160 mg/dL — ABNORMAL HIGH (ref 70–99)
Potassium: 5.8 mmol/L — ABNORMAL HIGH (ref 3.5–5.1)
Sodium: 142 mmol/L (ref 135–145)

## 2020-10-19 LAB — DIGOXIN LEVEL: Digoxin Level: 1 ng/mL (ref 0.8–2.0)

## 2020-10-19 MED ORDER — EMPAGLIFLOZIN 10 MG PO TABS: 10.0000 mg | ORAL_TABLET | Freq: Every day | ORAL | 11 refills | Status: DC

## 2020-10-19 NOTE — Progress Notes (Signed)
[facility] Transitions of Care Clinic Heart Failure Pharmacist Encounter  HPI:  75 yo F with PMH of tobacco abuse, permanent atrial fibrillation, COPD, and bipolar disorder. She presented to the ED on 10/07/20 with shortness of breath and afib with RVR. An ECHO was done on 10/08/20 and LVEF was 40-45% (down from 60-65% in July 2017). Stress test completed in July 2017 and was found to be low risk. She was then discharged on 10/13/20.   Today, Theresa Mendoza presents to the [facility] for follow up. She denies having shortness of breath, DOE, orthopnea/PND, edema, lightheadedness or dizziness. She has been taking all medications as prescribed. Her son helps her fill her pill box and organize her medications.   HF Medications: Furosemide 40 mg daily Metoprolol XL 75 mg daily Losartan 25 mg daily Digoxin 0.125 mg daily  Has the patient been experiencing any side effects to the medications prescribed?  no  Does the patient have any problems obtaining medications due to transportation or finances?   no  Understanding of regimen: fair Understanding of indications: fair Potential of compliance: good Patient understands to avoid NSAIDs. Patient understands to avoid decongestants.   Pertinent Lab Values: . Serum creatinine 1.24, BUN 27, Potassium 5.8, Sodium 142, Digoxin 1.0   Vital Signs: . Weight: 161 lbs (discharge weight: 170 lbs) . Blood pressure: 130/60  . Heart rate: 78   Medication Assistance / Insurance Benefits Check: Does the patient have prescription insurance?  Yes Type of insurance plan: Medicare  Outpatient Pharmacy:  Current outpatient [facility] Is the patient willing to transition their outpatient pharmacy to utilize a [facility] outpatient pharmacy with or without mail order?   No  Assessment: 1) Chronic systolic CHF (EF 09-32%), due to presumed NICM. NYHA class II symptoms. - Continue furosemide 40 mg daily - Continue  metoprolol XL 75 mg daily - Continue losartan 25 mg daily. Consider optimizing to Entresto at next visit. - Start Jardiance 10 mg daily - Continue digoxin 0.125 mg daily. Digoxin level collected today = 1.0.  Plan: 1) Medication changes: - Add Jardiance 10 mg daily  2) Patient Assistance: - Jardiance copay $47 per month - patient says this is affordable for her - Will provide her with free 14-day Jardiance copay card to use at the pharmacy  3) Follow up: - Next appointment with [facility] on 11/10/20  Sharen Hones, PharmD, BCPS Heart Failure Transitions of Care Clinic Pharmacist [phone]

## 2020-10-19 NOTE — Progress Notes (Addendum)
[facility] Transitions of Care Clinic  PCP: no pcp Primary Cardiologist: Armanda Magic  HPI:  Theresa Mendoza is a 75 y.o.  female  with a PMH significant for hx of tobacco use, remote MI, permanent Afib (on coumadin) has failed multiple cardioversions, COPD, HLD, Bipolar disorder, mild to moderate MR, pulmonary HTN  Followed by Dr. Mayford Knife in the past. Initially dx with Afib back in 2016. She had a L/RHC with normal coronaries and mildly elevated right heart pressures.  She has failed multiple attempts at cardioversion. Maintained on Cardizem/atenolol/digoxin for rate control and coumadin for anticoagulation. Echo from 01/2016 with normal EF of 60-65% with no rWMA noted, normal LA size, trivial MR. She was last seen in the office 11/2017 with Dr. Mayford Knife. At this visit her afib was rate controlled in the 60s. Continued on digoxin 0.25mg  daily, Cardizem CD 240mg  daily and atenolol 25mg  daily. Encouraged to cut back on her smoking. Digoxin level checked at this visit was noted elevated, instructed to cut back to 0.125mg  daily. She was then lost to follow up.    Presented to the ED from PCP office on 3/24 with progressive dyspnea, LE edema and Afib RVR. Has not been seen by her PCP in over a year. She does not handle her medications, this is done by her son.    Admitted 09/2020 with afib and RVR and volume overload had an echo that showed decline in EF to 40-45% with global hypokinesis, moderately dilated LA, mild to moderate AS. She continued to have elevated HRs and placed on IV amiodarone. Bridging with lovenox with subtherapeutic INR. Transitioned to eliquis.  Rate control meds were adjusted and she was discharged.    Doing okay since leaving the hospital going up and down her 16 stairs stops due to leg weakness but doesn't get short of breath.  Denies any chest pain with exertion or at rest.  Got a scale but hasn't used it yet.  Breathing has improved dramatically since leaving  the hospital.     ROS: All systems negative except as listed in HPI, PMH and Problem List.  SH:  Social History   Socioeconomic History  . Marital status: Widowed    Spouse name: Not on file  . Number of children: 1  . Years of education: Not on file  . Highest education level: High school graduate  Occupational History  . Occupation: retired  Tobacco Use  . Smoking status: Former Smoker    Packs/day: 1.50    Years: 60.00    Pack years: 90.00    Types: Cigarettes  . Smokeless tobacco: Never Used  . Tobacco comment: Has cut back to 1 pk a day from 3 a day.  Vaping Use  . Vaping Use: Never used  Substance and Sexual Activity  . Alcohol use: No    Alcohol/week: 0.0 standard drinks  . Drug use: No  . Sexual activity: Never  Other Topics Concern  . Not on file  Social History Narrative   ** Merged History Encounter **       Lives with son and daughter in law in a 2 story home.  Has 1 son.  Retired from [employer].  Education: high school.  Widowed.   Social Determinants of Health   Financial Resource Strain: Low Risk   . Difficulty of Paying Living Expenses: Not hard at all  Food Insecurity: No Food Insecurity  . Worried About Programme researcher, broadcasting/film/video in the Last Year: Never true  .  Ran Out of Food in the Last Year: Never true  Transportation Needs: No Transportation Needs  . Lack of Transportation (Medical): No  . Lack of Transportation (Non-Medical): No  Physical Activity: Inactive  . Days of Exercise per Week: 0 days  . Minutes of Exercise per Session: 0 min  Stress: Not on file  Social Connections: Not on file  Intimate Partner Violence: Not on file    FH:  Family History  Problem Relation Age of Onset  . Tuberculosis Father   . Depression Father   . Alzheimer's disease Mother   . Lung cancer Mother     Past Medical History:  Diagnosis Date  . Anxiety   . Bipolar 1 disorder (HCC)   . Bowel obstruction (HCC)   . Cataract   . Chronic atrial fibrillation (HCC)  09/2014   On chronic anticoagulation with coumadin.  She has failed several DCCVs in the past.  . COPD (chronic obstructive pulmonary disease) (HCC)   . Dyslipidemia, goal LDL below 70 12/25/2015  . Encounter for tobacco use cessation counseling 12/04/2017  . Glaucoma   . Hx of migraine headaches   . Memory loss   . Mitral regurgitation    mild to moderate on echo 09/2014  . Old MI (myocardial infarction) 09/2014   cath with normal coronary arteries and EF 50-55%  . Osteoarthritis   . Pulmonary HTN (HCC) 09/2014   Mild with PASP at cath  . RBBB   . Rheumatoid arthritis (HCC)   . Tobacco abuse     Current Outpatient Medications  Medication Sig Dispense Refill  . albuterol (PROVENTIL HFA;VENTOLIN HFA) 108 (90 Base) MCG/ACT inhaler Inhale 2 puffs into the lungs every 6 (six) hours as needed for wheezing or shortness of breath. 1 Inhaler 3  . apixaban (ELIQUIS) 5 MG TABS tablet Take 1 tablet (5 mg total) by mouth 2 (two) times daily. 60 tablet 0  . digoxin (LANOXIN) 0.125 MG tablet TAKE 1 TABLET BY MOUTH DAILY. PLEASE MAKE OVERDUE APPT WITH DR. Mayford Knife BEFORE ANYMORE REFILLS. 30 tablet 0  . diltiazem (CARDIZEM CD) 240 MG 24 hr capsule Take 240 mg by mouth daily.  1  . DULoxetine (CYMBALTA) 60 MG capsule TAKE 2 CAPSULES BY MOUTH EVERY DAY (Patient taking differently: Take 120 mg by mouth daily.) 180 capsule 2  . furosemide (LASIX) 40 MG tablet Take 1 tablet (40 mg total) by mouth daily. 30 tablet 1  . HYDROcodone-acetaminophen (NORCO) 10-325 MG tablet Take 1 tablet by mouth 5 (five) times daily as needed for moderate pain or severe pain.    Marland Kitchen losartan (COZAAR) 25 MG tablet Take 1 tablet (25 mg total) by mouth daily. 30 tablet 11  . metoprolol succinate (TOPROL-XL) 25 MG 24 hr tablet Take 3 tablets (75 mg total) by mouth daily. 90 tablet 0  . nicotine (NICODERM CQ - DOSED IN MG/24 HOURS) 21 mg/24hr patch Place 1 patch (21 mg total) onto the skin daily. 28 patch 0  . omeprazole (PRILOSEC) 40  MG capsule Take 40 mg by mouth in the morning. Before breakfast    . pravastatin (PRAVACHOL) 20 MG tablet Take 20 mg by mouth daily.     No current facility-administered medications for this encounter.    Vitals:   10/19/20 1520  BP: 130/60  Pulse: 78  SpO2: 97%  Weight: 73.4 kg (161 lb 12.8 oz)    PHYSICAL EXAM: Cardiac: JVD flat, normal rate and rhythm, clear s1 and s2, no murmurs, rubs  or gallops, no LE edema Pulmonary: CTAB, not in distress Abdominal: non distended abdomen, soft and nontender Psych: Alert, conversant, in good spirits   ECG   Afib rate 83, RBBB, LAFB   ASSESSMENT & PLAN: Chronic Combined Systolic and Diastolic CHF: -EF 40-45% compared with 60-65% in 2017 -NYHA Class II, euvolemic -tested for sleep apnea and it was negative five years ago has lost weigh since then -no ischemic eval noted would consider if no improvement with rate control and in line with goals of care -continue metoprolol succinate 75, losartan 25, digoxin 0.125 -can continue lasix at current dose -add jardiance 10mg  -check bmp and dig level today  Permanent Afib: -CHADS2VASC 5, continue eliquis -rate controlled on diltiazem 240, toprol xl 75, digoxin 0.125 -check dig level   Follow up with general cardiology

## 2020-10-19 NOTE — Patient Instructions (Addendum)
Labs done today. We will contact you only if your labs are abnormal.  START Jardiance 10mg  (1 tablet) by mouth daily.   No other medication changes were made. Please continue all current medications as prescribed.  Please keep your pending appointment with Dr. 

## 2020-10-20 ENCOUNTER — Telehealth (HOSPITAL_COMMUNITY): Payer: Self-pay

## 2020-10-20 ENCOUNTER — Other Ambulatory Visit (HOSPITAL_COMMUNITY): Payer: Self-pay

## 2020-10-20 ENCOUNTER — Encounter: Payer: Self-pay | Admitting: Podiatry

## 2020-10-20 ENCOUNTER — Ambulatory Visit (INDEPENDENT_AMBULATORY_CARE_PROVIDER_SITE_OTHER): Payer: Medicare Other | Admitting: Podiatry

## 2020-10-20 DIAGNOSIS — Z5181 Encounter for therapeutic drug level monitoring: Secondary | ICD-10-CM | POA: Diagnosis not present

## 2020-10-20 DIAGNOSIS — M79675 Pain in left toe(s): Secondary | ICD-10-CM

## 2020-10-20 DIAGNOSIS — B351 Tinea unguium: Secondary | ICD-10-CM | POA: Insufficient documentation

## 2020-10-20 DIAGNOSIS — D689 Coagulation defect, unspecified: Secondary | ICD-10-CM | POA: Insufficient documentation

## 2020-10-20 DIAGNOSIS — I5042 Chronic combined systolic (congestive) and diastolic (congestive) heart failure: Secondary | ICD-10-CM

## 2020-10-20 DIAGNOSIS — I4891 Unspecified atrial fibrillation: Secondary | ICD-10-CM | POA: Diagnosis not present

## 2020-10-20 DIAGNOSIS — I509 Heart failure, unspecified: Secondary | ICD-10-CM | POA: Diagnosis not present

## 2020-10-20 DIAGNOSIS — Z09 Encounter for follow-up examination after completed treatment for conditions other than malignant neoplasm: Secondary | ICD-10-CM | POA: Diagnosis not present

## 2020-10-20 DIAGNOSIS — F322 Major depressive disorder, single episode, severe without psychotic features: Secondary | ICD-10-CM | POA: Diagnosis not present

## 2020-10-20 DIAGNOSIS — M79674 Pain in right toe(s): Secondary | ICD-10-CM

## 2020-10-20 MED ORDER — FUROSEMIDE 40 MG PO TABS: 40.0000 mg | ORAL_TABLET | ORAL | 1 refills | Status: DC

## 2020-10-20 NOTE — Progress Notes (Signed)
This patient returns to my office for at risk foot care.  This patient requires this care by a professional since this patient will be at risk due to having coagulation defect due to eliquis.  This patient presents to the office with her daughter.  She was hospitalized for a-fib and is on eliquis.  This patient is unable to cut nails herself since the patient cannot reach her nails.These nails are painful walking and wearing shoes.  This patient presents for at risk foot care today.  General Appearance  Alert, conversant and in no acute stress.  Vascular  Dorsalis pedis and posterior tibial  pulses are not  Palpable due to swelling in her feet.  bilaterally.  Capillary return is within normal limits  bilaterally. Temperature is within normal limits  bilaterally.  Neurologic  Senn-Weinstein monofilament wire test within normal limits  bilaterally. Muscle power within normal limits bilaterally.  Nails Thick disfigured discolored nails with subungual debris  from hallux to fifth toes bilaterally. No evidence of bacterial infection or drainage bilaterally.  Orthopedic  No limitations of motion  feet .  No crepitus or effusions noted.  No bony pathology or digital deformities noted.  HAV 1st MPJ  B/L.  Skin  normotropic skin with no porokeratosis noted bilaterally.  No signs of infections or ulcers noted.     Onychomycosis  Pain in right toes  Pain in left toes  Consent was obtained for treatment procedures.   Mechanical debridement of nails 1-5  bilaterally performed with a nail nipper.  Filed with dremel without incident.    Return office visit   3 months                   Told patient to return for periodic foot care and evaluation due to potential at risk complications.   Helane Gunther DPM

## 2020-10-20 NOTE — Telephone Encounter (Signed)
-----   Message from Angelita Ingles, MD sent at 10/20/2020 11:58 AM EDT ----- Hyperkalemic with addition of losartan also probably doesn't need daily lasix especially with adding jardiance.  Will d/c losartan for now, switch lasix to every other day and have her follow up in one week for repeat bmp.

## 2020-10-20 NOTE — Telephone Encounter (Signed)
Patients daughter advised and verbalized understanding. Lab appt scheduled, lab order entered. Med list updated to reflect changes.   Meds ordered this encounter  Medications  . furosemide (LASIX) 40 MG tablet    Sig: Take 1 tablet (40 mg total) by mouth every other day.    Dispense:  30 tablet    Refill:  1    Please cancel all previous orders for current medication. Change in dosage or pill size.   Orders Placed This Encounter  Procedures  . Basic metabolic panel    Standing Status:   Future    Standing Expiration Date:   10/20/2021    Order Specific Question:   Release to patient    Answer:   Immediate  . B Nat Peptide    Standing Status:   Future    Standing Expiration Date:   10/20/2021    Order Specific Question:   Release to patient    Answer:   Immediate

## 2020-10-28 ENCOUNTER — Other Ambulatory Visit: Payer: Self-pay

## 2020-10-28 ENCOUNTER — Ambulatory Visit (HOSPITAL_COMMUNITY)
Admission: RE | Admit: 2020-10-28 | Discharge: 2020-10-28 | Disposition: A | Discharge status: Home / Self Care | Payer: Medicare Other | Source: Ambulatory Visit | Attending: Cardiology | Admitting: Cardiology

## 2020-10-28 DIAGNOSIS — I5042 Chronic combined systolic (congestive) and diastolic (congestive) heart failure: Secondary | ICD-10-CM | POA: Diagnosis not present

## 2020-10-28 LAB — BASIC METABOLIC PANEL
Anion gap: 8 (ref 5–15)
BUN: 19 mg/dL (ref 8–23)
CO2: 28 mmol/L (ref 22–32)
Calcium: 9.1 mg/dL (ref 8.9–10.3)
Chloride: 101 mmol/L (ref 98–111)
Creatinine, Ser: 1.03 mg/dL — ABNORMAL HIGH (ref 0.44–1.00)
GFR, Estimated: 57 mL/min — ABNORMAL LOW (ref >60)
Glucose, Bld: 208 mg/dL — ABNORMAL HIGH (ref 70–99)
Potassium: 4.8 mmol/L (ref 3.5–5.1)
Sodium: 137 mmol/L (ref 135–145)

## 2020-10-28 LAB — BRAIN NATRIURETIC PEPTIDE: B Natriuretic Peptide: 248.5 pg/mL — ABNORMAL HIGH (ref 0.0–100.0)

## 2020-11-01 DIAGNOSIS — J181 Lobar pneumonia, unspecified organism: Secondary | ICD-10-CM | POA: Diagnosis not present

## 2020-11-01 DIAGNOSIS — Z9981 Dependence on supplemental oxygen: Secondary | ICD-10-CM | POA: Diagnosis not present

## 2020-11-01 DIAGNOSIS — I080 Rheumatic disorders of both mitral and aortic valves: Secondary | ICD-10-CM | POA: Diagnosis not present

## 2020-11-01 DIAGNOSIS — J440 Chronic obstructive pulmonary disease with acute lower respiratory infection: Secondary | ICD-10-CM | POA: Diagnosis not present

## 2020-11-01 DIAGNOSIS — I4510 Unspecified right bundle-branch block: Secondary | ICD-10-CM | POA: Diagnosis not present

## 2020-11-01 DIAGNOSIS — I110 Hypertensive heart disease with heart failure: Secondary | ICD-10-CM | POA: Diagnosis not present

## 2020-11-01 DIAGNOSIS — J441 Chronic obstructive pulmonary disease with (acute) exacerbation: Secondary | ICD-10-CM | POA: Diagnosis not present

## 2020-11-01 DIAGNOSIS — I482 Chronic atrial fibrillation, unspecified: Secondary | ICD-10-CM | POA: Diagnosis not present

## 2020-11-01 DIAGNOSIS — I5043 Acute on chronic combined systolic (congestive) and diastolic (congestive) heart failure: Secondary | ICD-10-CM | POA: Diagnosis not present

## 2020-11-01 DIAGNOSIS — Z7901 Long term (current) use of anticoagulants: Secondary | ICD-10-CM | POA: Diagnosis not present

## 2020-11-01 DIAGNOSIS — E785 Hyperlipidemia, unspecified: Secondary | ICD-10-CM | POA: Diagnosis not present

## 2020-11-01 DIAGNOSIS — H409 Unspecified glaucoma: Secondary | ICD-10-CM | POA: Diagnosis not present

## 2020-11-01 DIAGNOSIS — M19041 Primary osteoarthritis, right hand: Secondary | ICD-10-CM | POA: Diagnosis not present

## 2020-11-01 DIAGNOSIS — I272 Pulmonary hypertension, unspecified: Secondary | ICD-10-CM | POA: Diagnosis not present

## 2020-11-01 DIAGNOSIS — I252 Old myocardial infarction: Secondary | ICD-10-CM | POA: Diagnosis not present

## 2020-11-01 DIAGNOSIS — F17210 Nicotine dependence, cigarettes, uncomplicated: Secondary | ICD-10-CM | POA: Diagnosis not present

## 2020-11-01 DIAGNOSIS — J9611 Chronic respiratory failure with hypoxia: Secondary | ICD-10-CM | POA: Diagnosis not present

## 2020-11-01 DIAGNOSIS — M069 Rheumatoid arthritis, unspecified: Secondary | ICD-10-CM | POA: Diagnosis not present

## 2020-11-01 DIAGNOSIS — Z9181 History of falling: Secondary | ICD-10-CM | POA: Diagnosis not present

## 2020-11-01 DIAGNOSIS — F331 Major depressive disorder, recurrent, moderate: Secondary | ICD-10-CM | POA: Diagnosis not present

## 2020-11-01 DIAGNOSIS — I480 Paroxysmal atrial fibrillation: Secondary | ICD-10-CM | POA: Diagnosis not present

## 2020-11-01 DIAGNOSIS — E119 Type 2 diabetes mellitus without complications: Secondary | ICD-10-CM | POA: Diagnosis not present

## 2020-11-01 DIAGNOSIS — Z7984 Long term (current) use of oral hypoglycemic drugs: Secondary | ICD-10-CM | POA: Diagnosis not present

## 2020-11-01 DIAGNOSIS — M19042 Primary osteoarthritis, left hand: Secondary | ICD-10-CM | POA: Diagnosis not present

## 2020-11-02 ENCOUNTER — Telehealth: Payer: Self-pay | Admitting: Cardiology

## 2020-11-02 DIAGNOSIS — I5043 Acute on chronic combined systolic (congestive) and diastolic (congestive) heart failure: Secondary | ICD-10-CM | POA: Diagnosis not present

## 2020-11-02 DIAGNOSIS — I110 Hypertensive heart disease with heart failure: Secondary | ICD-10-CM | POA: Diagnosis not present

## 2020-11-02 DIAGNOSIS — I482 Chronic atrial fibrillation, unspecified: Secondary | ICD-10-CM | POA: Diagnosis not present

## 2020-11-02 DIAGNOSIS — J440 Chronic obstructive pulmonary disease with acute lower respiratory infection: Secondary | ICD-10-CM | POA: Diagnosis not present

## 2020-11-02 DIAGNOSIS — J441 Chronic obstructive pulmonary disease with (acute) exacerbation: Secondary | ICD-10-CM | POA: Diagnosis not present

## 2020-11-02 DIAGNOSIS — J181 Lobar pneumonia, unspecified organism: Secondary | ICD-10-CM | POA: Diagnosis not present

## 2020-11-02 NOTE — Telephone Encounter (Signed)
Angelique Blonder from [facility] is calling in regards to this patient being Non Compliant with her fluid restrictions, Angelique Blonder wants to know if pt is on Fluid restrictions

## 2020-11-03 NOTE — Telephone Encounter (Signed)
Spoke with Angelique Blonder from [place] who states that the patient was discharged from the hospital on 3/30, however she has just started seeing the patient this week. She states that on the referral to [facility] it stated that she was supposed to be on a 1800 mL fluid restriction, however there are no notes from the discharge summary indicating the patient should be restricting fluids. She states that the patient is definitely drinking way more fluids than 1800 mL per day. She states that the patient has dry mouth and drinks a lot of milk in the mornings and continuously drinks water through out the day. She states that when she was there yesterday the patient had very trace edema in her feet only and over all looked good. She states that the family has a scale but it does not have batteries so they have not been weight her. Angelique Blonder has encouraged them to get some batteries and obtain daily weights. She has advised her on restricting her fluid intake but is not sure that the patient will be compliant. She also wanted to confirm that the patient should have discontinued her losartan and only taking Lasix every other day. I advised her that this was correct. Patient is scheduled to see Dr. Mayford Knife next week 4/28. Angelique Blonder advised that we call the daughter-in-law if she is not present at the visit as the patient is somewhat forgetful.

## 2020-11-06 DIAGNOSIS — I482 Chronic atrial fibrillation, unspecified: Secondary | ICD-10-CM | POA: Diagnosis not present

## 2020-11-06 DIAGNOSIS — J441 Chronic obstructive pulmonary disease with (acute) exacerbation: Secondary | ICD-10-CM | POA: Diagnosis not present

## 2020-11-06 DIAGNOSIS — J440 Chronic obstructive pulmonary disease with acute lower respiratory infection: Secondary | ICD-10-CM | POA: Diagnosis not present

## 2020-11-06 DIAGNOSIS — I5043 Acute on chronic combined systolic (congestive) and diastolic (congestive) heart failure: Secondary | ICD-10-CM | POA: Diagnosis not present

## 2020-11-06 DIAGNOSIS — I110 Hypertensive heart disease with heart failure: Secondary | ICD-10-CM | POA: Diagnosis not present

## 2020-11-06 DIAGNOSIS — J181 Lobar pneumonia, unspecified organism: Secondary | ICD-10-CM | POA: Diagnosis not present

## 2020-11-08 DIAGNOSIS — I110 Hypertensive heart disease with heart failure: Secondary | ICD-10-CM | POA: Diagnosis not present

## 2020-11-08 DIAGNOSIS — J181 Lobar pneumonia, unspecified organism: Secondary | ICD-10-CM | POA: Diagnosis not present

## 2020-11-08 DIAGNOSIS — J440 Chronic obstructive pulmonary disease with acute lower respiratory infection: Secondary | ICD-10-CM | POA: Diagnosis not present

## 2020-11-08 DIAGNOSIS — I482 Chronic atrial fibrillation, unspecified: Secondary | ICD-10-CM | POA: Diagnosis not present

## 2020-11-08 DIAGNOSIS — J441 Chronic obstructive pulmonary disease with (acute) exacerbation: Secondary | ICD-10-CM | POA: Diagnosis not present

## 2020-11-08 DIAGNOSIS — I5043 Acute on chronic combined systolic (congestive) and diastolic (congestive) heart failure: Secondary | ICD-10-CM | POA: Diagnosis not present

## 2020-11-09 ENCOUNTER — Other Ambulatory Visit (HOSPITAL_COMMUNITY): Payer: Self-pay | Admitting: Psychiatry

## 2020-11-09 NOTE — Progress Notes (Addendum)
Cardiology Office Note:    Date:  11/10/2020   ID:  Theresa Mendoza, Theresa Mendoza 1946-06-26, MRN [id number]  PCP:  Shirlean Mylar, MD  Cardiologist:  Armanda Magic, MD    Referring MD: No ref. provider found   Chief Complaint  Patient presents with  . Coronary Artery Disease  . Atrial Fibrillation  . Hyperlipidemia    History of Present Illness:    Theresa Mendoza is a 75 y.o. female with a hx of obacco use, ASCAD with MI remotely and permanent atrial fibrillation on chronic anticoagulation with coumadin after failing several cardioversions in the past.  She  has chronic COPD with chronic shortness of breath.  She was recently hospitalized with acute on chronic combined systolic/diastolic CHF and was treated with IV diuresis.  2D echo showed EF 45% which was mildly reduced from prior.  She also was treated for an acute COPD exacerbation.  She developed a rapid HR in her chronic afib during her hospitalization after running out of her dig.  Her coumadin was stopped and she was started on Eliquis 5mg  BID and dig was restarted along with Toprol and Cardizem.    She is here today for followup and is doing well.  She denies any chest pain or pressure, SOB, DOE, PND, orthopnea,  dizziness(unless she gets up too fast), palpitations or syncope. She occasionally has some trace LE edema.  She is compliant with her meds and is tolerating meds with no SE.    Past Medical History:  Diagnosis Date  . Anxiety   . Bipolar 1 disorder (HCC)   . Bowel obstruction (HCC)   . Cataract   . Chronic atrial fibrillation (HCC) 09/2014   On chronic anticoagulation with coumadin.  She has failed several DCCVs in the past.  . COPD (chronic obstructive pulmonary disease) (HCC)   . Dyslipidemia, goal LDL below 70 12/25/2015  . Encounter for tobacco use cessation counseling 12/04/2017  . Glaucoma   . Hx of migraine headaches   . Memory loss   . Mitral regurgitation    mild to moderate on echo 09/2014  . Old MI  (myocardial infarction) 09/2014   cath with normal coronary arteries and EF 50-55%  . Osteoarthritis   . Pulmonary HTN (HCC) 09/2014   Mild with PASP 10/2014 at cath  . RBBB   . Rheumatoid arthritis (HCC)   . Tobacco abuse     Past Surgical History:  Procedure Laterality Date  . ADENOIDECTOMY    . BACK SURGERY    . bowel obstruction    . CARDIAC CATHETERIZATION  09/2014   normal coronary arteries with mild pulmonary HTN, low normal LVF  . CHOLECYSTECTOMY    . NOSE SURGERY    . TONSILLECTOMY    . TRANSESOPHAGEAL ECHOCARDIOGRAM WITH CARDIOVERSION  09/2014  . VAGINAL HYSTERECTOMY     with BSO    Current Medications: Current Meds  Medication Sig  . albuterol (PROVENTIL HFA;VENTOLIN HFA) 108 (90 Base) MCG/ACT inhaler Inhale 2 puffs into the lungs every 6 (six) hours as needed for wheezing or shortness of breath.  . ALPRAZolam (XANAX) 0.25 MG tablet Take 0.25 mg by mouth daily as needed.  10/2014 apixaban (ELIQUIS) 5 MG TABS tablet Take 1 tablet (5 mg total) by mouth 2 (two) times daily.  . digoxin (LANOXIN) 0.125 MG tablet TAKE 1 TABLET BY MOUTH DAILY. PLEASE MAKE OVERDUE APPT WITH DR. Marland Kitchen BEFORE ANYMORE REFILLS.  Mayford Knife diltiazem (CARDIZEM CD) 240 MG 24 hr capsule Take 240  mg by mouth daily.  . DULoxetine (CYMBALTA) 60 MG capsule TAKE 2 CAPSULES BY MOUTH EVERY DAY (Patient taking differently: Take 120 mg by mouth daily.)  . empagliflozin (JARDIANCE) 10 MG TABS tablet Take 1 tablet (10 mg total) by mouth daily before breakfast.  . furosemide (LASIX) 40 MG tablet Take 1 tablet (40 mg total) by mouth every other day.  Marland Kitchen HYDROcodone-acetaminophen (NORCO) 10-325 MG tablet Take 1 tablet by mouth 5 (five) times daily as needed for moderate pain or severe pain.  . metoprolol succinate (TOPROL-XL) 25 MG 24 hr tablet Take 3 tablets (75 mg total) by mouth daily.  Marland Kitchen omeprazole (PRILOSEC) 40 MG capsule Take 40 mg by mouth in the morning. Before breakfast  . pravastatin (PRAVACHOL) 20 MG tablet Take 20 mg by  mouth daily.     Allergies:   Lithium and Tegretol [carbamazepine]   Social History   Socioeconomic History  . Marital status: Widowed    Spouse name: Not on file  . Number of children: 1  . Years of education: Not on file  . Highest education level: High school graduate  Occupational History  . Occupation: retired  Tobacco Use  . Smoking status: Former Smoker    Packs/day: 1.50    Years: 60.00    Pack years: 90.00    Types: Cigarettes  . Smokeless tobacco: Never Used  . Tobacco comment: Has cut back to 1 pk a day from 3 a day.  Vaping Use  . Vaping Use: Never used  Substance and Sexual Activity  . Alcohol use: No    Alcohol/week: 0.0 standard drinks  . Drug use: No  . Sexual activity: Never  Other Topics Concern  . Not on file  Social History Narrative   ** Merged History Encounter **       Lives with son and daughter in law in a 2 story home.  Has 1 son.  Retired from [employer].  Education: high school.  Widowed.   Social Determinants of Health   Financial Resource Strain: Low Risk   . Difficulty of Paying Living Expenses: Not hard at all  Food Insecurity: No Food Insecurity  . Worried About Programme researcher, broadcasting/film/video in the Last Year: Never true  . Ran Out of Food in the Last Year: Never true  Transportation Needs: No Transportation Needs  . Lack of Transportation (Medical): No  . Lack of Transportation (Non-Medical): No  Physical Activity: Inactive  . Days of Exercise per Week: 0 days  . Minutes of Exercise per Session: 0 min  Stress: Not on file  Social Connections: Not on file     Family History: The patient's family history includes Alzheimer's disease in her mother; Depression in her father; Lung cancer in her mother; Tuberculosis in her father.  ROS:   Please see the history of present illness.    ROS  All other systems reviewed and negative.   EKGs/Labs/Other Studies Reviewed:    The following studies were reviewed today:   EKG:  EKG is  ordered today.   The ekg ordered today demonstrates atrial fibrillation at 66 bpm with right bundle branch block, LVH with QRS widening and anterior infarct  Recent Labs: 10/07/2020: ALT 21; Magnesium 2.0; TSH 1.501 10/13/2020: Hemoglobin 14.1; Platelets 295 10/28/2020: B Natriuretic Peptide 248.5; BUN 19; Creatinine, Ser 1.03; Potassium 4.8; Sodium 137   Recent Lipid Panel No results found for: CHOL, TRIG, HDL, CHOLHDL, VLDL, LDLCALC, LDLDIRECT  Physical Exam:    VS:  BP 120/60 (BP Location: Left Arm, Patient Position: Sitting, Cuff Size: Normal)   Pulse 69   Ht 5\' 5"  (1.651 m)   Wt 162 lb (73.5 kg)   SpO2 97%   BMI 26.96 kg/m     Wt Readings from Last 3 Encounters:  11/10/20 162 lb (73.5 kg)  10/19/20 161 lb 12.8 oz (73.4 kg)  10/13/20 170 lb 11.2 oz (77.4 kg)     GEN: Well nourished, well developed in no acute distress HEENT: Normal NECK: No JVD; No carotid bruits LYMPHATICS: No lymphadenopathy CARDIAC:irregularly irregular, no murmurs, rubs, gallops RESPIRATORY:  Clear to auscultation without rales, wheezing or rhonchi  ABDOMEN: Soft, non-tender, non-distended MUSCULOSKELETAL:  No edema; No deformity  SKIN: Warm and dry NEUROLOGIC:  Alert and oriented x 3 PSYCHIATRIC:  Normal affect    ASSESSMENT:    1. Coronary artery disease involving native coronary artery of native heart without angina pectoris   2. Permanent atrial fibrillation (HCC)   3. Dyslipidemia, goal LDL below 70    PLAN:    In order of problems listed above:  1.  ASCAD  - status post prior infarct with cath apparently showing normal coronary arteries.   -she denies any anginal symptoms -she has chronic DOE related to COPD and ongoing tobacco abuse  2.  Permanent atrial fibrillation.   -recent exacerbation of RVR in setting of acute CHF and COPD exacerbations -HR controlled on exam today.  -She will continue on digoxin 0.125 mg daily, Cardizem CD 240 mg daily, Toprol XL 75 mg daily and Eliquis 5mg  BID -Hbg stable  at 14.1 and SCr 1.03 recently -dig level 1 on 10/19/2020  3.  Dyslipidemia  -is followed by his PCP  -continue Pravachol  4.  Tobacco abuse -she quit after her last hospitalization and I congratulated her  Medication Adjustments/Labs and Tests Ordered: Current medicines are reviewed at length with the patient today.  Concerns regarding medicines are outlined above.  No orders of the defined types were placed in this encounter.  No orders of the defined types were placed in this encounter.   Signed, Armanda Magic, MD  11/10/2020 8:37 AM    [facility]

## 2020-11-10 ENCOUNTER — Encounter: Payer: Self-pay | Admitting: Cardiology

## 2020-11-10 ENCOUNTER — Other Ambulatory Visit: Payer: Self-pay

## 2020-11-10 ENCOUNTER — Ambulatory Visit (INDEPENDENT_AMBULATORY_CARE_PROVIDER_SITE_OTHER): Payer: Medicare Other | Admitting: Cardiology

## 2020-11-10 VITALS — BP 120/60 | HR 69 | Ht 65.0 in | Wt 162.0 lb

## 2020-11-10 DIAGNOSIS — I4821 Permanent atrial fibrillation: Secondary | ICD-10-CM | POA: Diagnosis not present

## 2020-11-10 DIAGNOSIS — I2510 Atherosclerotic heart disease of native coronary artery without angina pectoris: Secondary | ICD-10-CM

## 2020-11-10 DIAGNOSIS — E785 Hyperlipidemia, unspecified: Secondary | ICD-10-CM

## 2020-11-10 MED ORDER — METOPROLOL SUCCINATE ER 25 MG PO TB24: 75.0000 mg | ORAL_TABLET | Freq: Every day | ORAL | 3 refills | Status: DC

## 2020-11-10 NOTE — Addendum Note (Signed)
Addended by: Theresia Majors on: 11/10/2020 08:47 AM   Modules accepted: Orders

## 2020-11-10 NOTE — Patient Instructions (Signed)

## 2020-11-11 DIAGNOSIS — I5043 Acute on chronic combined systolic (congestive) and diastolic (congestive) heart failure: Secondary | ICD-10-CM | POA: Diagnosis not present

## 2020-11-11 DIAGNOSIS — J440 Chronic obstructive pulmonary disease with acute lower respiratory infection: Secondary | ICD-10-CM | POA: Diagnosis not present

## 2020-11-11 DIAGNOSIS — J441 Chronic obstructive pulmonary disease with (acute) exacerbation: Secondary | ICD-10-CM | POA: Diagnosis not present

## 2020-11-11 DIAGNOSIS — I482 Chronic atrial fibrillation, unspecified: Secondary | ICD-10-CM | POA: Diagnosis not present

## 2020-11-11 DIAGNOSIS — I110 Hypertensive heart disease with heart failure: Secondary | ICD-10-CM | POA: Diagnosis not present

## 2020-11-11 DIAGNOSIS — J181 Lobar pneumonia, unspecified organism: Secondary | ICD-10-CM | POA: Diagnosis not present

## 2020-11-13 DIAGNOSIS — I5043 Acute on chronic combined systolic (congestive) and diastolic (congestive) heart failure: Secondary | ICD-10-CM | POA: Diagnosis not present

## 2020-11-13 DIAGNOSIS — J440 Chronic obstructive pulmonary disease with acute lower respiratory infection: Secondary | ICD-10-CM | POA: Diagnosis not present

## 2020-11-13 DIAGNOSIS — J441 Chronic obstructive pulmonary disease with (acute) exacerbation: Secondary | ICD-10-CM | POA: Diagnosis not present

## 2020-11-13 DIAGNOSIS — I110 Hypertensive heart disease with heart failure: Secondary | ICD-10-CM | POA: Diagnosis not present

## 2020-11-13 DIAGNOSIS — I482 Chronic atrial fibrillation, unspecified: Secondary | ICD-10-CM | POA: Diagnosis not present

## 2020-11-13 DIAGNOSIS — J181 Lobar pneumonia, unspecified organism: Secondary | ICD-10-CM | POA: Diagnosis not present

## 2020-11-15 ENCOUNTER — Telehealth: Payer: Self-pay

## 2020-11-15 DIAGNOSIS — J441 Chronic obstructive pulmonary disease with (acute) exacerbation: Secondary | ICD-10-CM | POA: Diagnosis not present

## 2020-11-15 DIAGNOSIS — I5043 Acute on chronic combined systolic (congestive) and diastolic (congestive) heart failure: Secondary | ICD-10-CM | POA: Diagnosis not present

## 2020-11-15 DIAGNOSIS — J181 Lobar pneumonia, unspecified organism: Secondary | ICD-10-CM | POA: Diagnosis not present

## 2020-11-15 DIAGNOSIS — I110 Hypertensive heart disease with heart failure: Secondary | ICD-10-CM | POA: Diagnosis not present

## 2020-11-15 DIAGNOSIS — J440 Chronic obstructive pulmonary disease with acute lower respiratory infection: Secondary | ICD-10-CM | POA: Diagnosis not present

## 2020-11-15 DIAGNOSIS — I482 Chronic atrial fibrillation, unspecified: Secondary | ICD-10-CM | POA: Diagnosis not present

## 2020-11-15 NOTE — Telephone Encounter (Signed)
Left message for patient's daughter to call back. Need to make sure the patient is not taking both atenolol and metoprolol.

## 2020-11-17 DIAGNOSIS — J440 Chronic obstructive pulmonary disease with acute lower respiratory infection: Secondary | ICD-10-CM | POA: Diagnosis not present

## 2020-11-17 DIAGNOSIS — I110 Hypertensive heart disease with heart failure: Secondary | ICD-10-CM | POA: Diagnosis not present

## 2020-11-17 DIAGNOSIS — J181 Lobar pneumonia, unspecified organism: Secondary | ICD-10-CM | POA: Diagnosis not present

## 2020-11-17 DIAGNOSIS — I482 Chronic atrial fibrillation, unspecified: Secondary | ICD-10-CM | POA: Diagnosis not present

## 2020-11-17 DIAGNOSIS — I5043 Acute on chronic combined systolic (congestive) and diastolic (congestive) heart failure: Secondary | ICD-10-CM | POA: Diagnosis not present

## 2020-11-17 DIAGNOSIS — J441 Chronic obstructive pulmonary disease with (acute) exacerbation: Secondary | ICD-10-CM | POA: Diagnosis not present

## 2020-11-17 NOTE — Telephone Encounter (Signed)
Left message to call back  

## 2020-11-20 DIAGNOSIS — I482 Chronic atrial fibrillation, unspecified: Secondary | ICD-10-CM | POA: Diagnosis not present

## 2020-11-20 DIAGNOSIS — J440 Chronic obstructive pulmonary disease with acute lower respiratory infection: Secondary | ICD-10-CM | POA: Diagnosis not present

## 2020-11-20 DIAGNOSIS — I110 Hypertensive heart disease with heart failure: Secondary | ICD-10-CM | POA: Diagnosis not present

## 2020-11-20 DIAGNOSIS — J441 Chronic obstructive pulmonary disease with (acute) exacerbation: Secondary | ICD-10-CM | POA: Diagnosis not present

## 2020-11-20 DIAGNOSIS — J181 Lobar pneumonia, unspecified organism: Secondary | ICD-10-CM | POA: Diagnosis not present

## 2020-11-20 DIAGNOSIS — I5043 Acute on chronic combined systolic (congestive) and diastolic (congestive) heart failure: Secondary | ICD-10-CM | POA: Diagnosis not present

## 2020-11-22 DIAGNOSIS — I110 Hypertensive heart disease with heart failure: Secondary | ICD-10-CM | POA: Diagnosis not present

## 2020-11-22 DIAGNOSIS — J441 Chronic obstructive pulmonary disease with (acute) exacerbation: Secondary | ICD-10-CM | POA: Diagnosis not present

## 2020-11-22 DIAGNOSIS — I482 Chronic atrial fibrillation, unspecified: Secondary | ICD-10-CM | POA: Diagnosis not present

## 2020-11-22 DIAGNOSIS — J440 Chronic obstructive pulmonary disease with acute lower respiratory infection: Secondary | ICD-10-CM | POA: Diagnosis not present

## 2020-11-22 DIAGNOSIS — J181 Lobar pneumonia, unspecified organism: Secondary | ICD-10-CM | POA: Diagnosis not present

## 2020-11-22 DIAGNOSIS — I5043 Acute on chronic combined systolic (congestive) and diastolic (congestive) heart failure: Secondary | ICD-10-CM | POA: Diagnosis not present

## 2020-11-24 NOTE — Telephone Encounter (Signed)
Per discharge summary from 10/13/20 patient discontinued atenolol

## 2020-11-25 DIAGNOSIS — J440 Chronic obstructive pulmonary disease with acute lower respiratory infection: Secondary | ICD-10-CM | POA: Diagnosis not present

## 2020-11-25 DIAGNOSIS — J441 Chronic obstructive pulmonary disease with (acute) exacerbation: Secondary | ICD-10-CM | POA: Diagnosis not present

## 2020-11-25 DIAGNOSIS — J181 Lobar pneumonia, unspecified organism: Secondary | ICD-10-CM | POA: Diagnosis not present

## 2020-11-25 DIAGNOSIS — I482 Chronic atrial fibrillation, unspecified: Secondary | ICD-10-CM | POA: Diagnosis not present

## 2020-11-25 DIAGNOSIS — I110 Hypertensive heart disease with heart failure: Secondary | ICD-10-CM | POA: Diagnosis not present

## 2020-11-25 DIAGNOSIS — I5043 Acute on chronic combined systolic (congestive) and diastolic (congestive) heart failure: Secondary | ICD-10-CM | POA: Diagnosis not present

## 2020-11-27 DIAGNOSIS — J441 Chronic obstructive pulmonary disease with (acute) exacerbation: Secondary | ICD-10-CM | POA: Diagnosis not present

## 2020-11-27 DIAGNOSIS — J181 Lobar pneumonia, unspecified organism: Secondary | ICD-10-CM | POA: Diagnosis not present

## 2020-11-27 DIAGNOSIS — J440 Chronic obstructive pulmonary disease with acute lower respiratory infection: Secondary | ICD-10-CM | POA: Diagnosis not present

## 2020-11-27 DIAGNOSIS — I482 Chronic atrial fibrillation, unspecified: Secondary | ICD-10-CM | POA: Diagnosis not present

## 2020-11-27 DIAGNOSIS — I110 Hypertensive heart disease with heart failure: Secondary | ICD-10-CM | POA: Diagnosis not present

## 2020-11-27 DIAGNOSIS — I5043 Acute on chronic combined systolic (congestive) and diastolic (congestive) heart failure: Secondary | ICD-10-CM | POA: Diagnosis not present

## 2020-12-02 ENCOUNTER — Other Ambulatory Visit: Payer: Self-pay | Admitting: Pharmacist

## 2020-12-02 MED ORDER — APIXABAN 5 MG PO TABS: 5.0000 mg | ORAL_TABLET | Freq: Two times a day (BID) | ORAL | 5 refills | Status: DC

## 2020-12-03 ENCOUNTER — Other Ambulatory Visit: Payer: Self-pay | Admitting: Cardiology

## 2021-01-21 ENCOUNTER — Ambulatory Visit: Payer: Medicare Other | Admitting: Podiatry

## 2021-03-10 DIAGNOSIS — E782 Mixed hyperlipidemia: Secondary | ICD-10-CM | POA: Diagnosis not present

## 2021-03-10 DIAGNOSIS — Z5181 Encounter for therapeutic drug level monitoring: Secondary | ICD-10-CM | POA: Diagnosis not present

## 2021-03-10 DIAGNOSIS — I4891 Unspecified atrial fibrillation: Secondary | ICD-10-CM | POA: Diagnosis not present

## 2021-03-10 DIAGNOSIS — Z7901 Long term (current) use of anticoagulants: Secondary | ICD-10-CM | POA: Diagnosis not present

## 2021-03-10 DIAGNOSIS — E119 Type 2 diabetes mellitus without complications: Secondary | ICD-10-CM | POA: Diagnosis not present

## 2021-03-10 DIAGNOSIS — J410 Simple chronic bronchitis: Secondary | ICD-10-CM | POA: Diagnosis not present

## 2021-03-10 DIAGNOSIS — I2510 Atherosclerotic heart disease of native coronary artery without angina pectoris: Secondary | ICD-10-CM | POA: Diagnosis not present

## 2021-03-10 DIAGNOSIS — Z0000 Encounter for general adult medical examination without abnormal findings: Secondary | ICD-10-CM | POA: Diagnosis not present

## 2021-03-10 DIAGNOSIS — F3132 Bipolar disorder, current episode depressed, moderate: Secondary | ICD-10-CM | POA: Diagnosis not present

## 2021-03-10 DIAGNOSIS — R739 Hyperglycemia, unspecified: Secondary | ICD-10-CM | POA: Diagnosis not present

## 2021-03-23 DIAGNOSIS — R413 Other amnesia: Secondary | ICD-10-CM | POA: Diagnosis not present

## 2021-03-23 DIAGNOSIS — Z23 Encounter for immunization: Secondary | ICD-10-CM | POA: Diagnosis not present

## 2021-03-23 DIAGNOSIS — G894 Chronic pain syndrome: Secondary | ICD-10-CM | POA: Diagnosis not present

## 2021-03-23 DIAGNOSIS — R0602 Shortness of breath: Secondary | ICD-10-CM | POA: Diagnosis not present

## 2021-04-01 ENCOUNTER — Other Ambulatory Visit (HOSPITAL_COMMUNITY): Payer: Self-pay | Admitting: Family Medicine

## 2021-04-01 DIAGNOSIS — R7989 Other specified abnormal findings of blood chemistry: Secondary | ICD-10-CM

## 2021-04-01 DIAGNOSIS — R609 Edema, unspecified: Secondary | ICD-10-CM

## 2021-04-07 ENCOUNTER — Other Ambulatory Visit (HOSPITAL_BASED_OUTPATIENT_CLINIC_OR_DEPARTMENT_OTHER): Payer: 59

## 2021-05-04 DIAGNOSIS — G894 Chronic pain syndrome: Secondary | ICD-10-CM | POA: Diagnosis not present

## 2021-05-04 DIAGNOSIS — J019 Acute sinusitis, unspecified: Secondary | ICD-10-CM | POA: Diagnosis not present

## 2021-05-04 DIAGNOSIS — Z23 Encounter for immunization: Secondary | ICD-10-CM | POA: Diagnosis not present

## 2021-05-18 DIAGNOSIS — M4316 Spondylolisthesis, lumbar region: Secondary | ICD-10-CM | POA: Diagnosis not present

## 2021-05-18 DIAGNOSIS — M47816 Spondylosis without myelopathy or radiculopathy, lumbar region: Secondary | ICD-10-CM | POA: Diagnosis not present

## 2021-05-18 DIAGNOSIS — M4726 Other spondylosis with radiculopathy, lumbar region: Secondary | ICD-10-CM | POA: Diagnosis not present

## 2021-05-18 DIAGNOSIS — M5416 Radiculopathy, lumbar region: Secondary | ICD-10-CM | POA: Diagnosis not present

## 2021-05-18 DIAGNOSIS — M5116 Intervertebral disc disorders with radiculopathy, lumbar region: Secondary | ICD-10-CM | POA: Diagnosis not present

## 2021-06-06 DIAGNOSIS — Z23 Encounter for immunization: Secondary | ICD-10-CM | POA: Diagnosis not present

## 2021-06-08 DIAGNOSIS — M47816 Spondylosis without myelopathy or radiculopathy, lumbar region: Secondary | ICD-10-CM | POA: Diagnosis not present

## 2021-06-08 DIAGNOSIS — M792 Neuralgia and neuritis, unspecified: Secondary | ICD-10-CM | POA: Diagnosis not present

## 2021-06-11 ENCOUNTER — Other Ambulatory Visit: Payer: Self-pay | Admitting: Cardiology

## 2021-06-13 NOTE — Telephone Encounter (Signed)
Eliquis 5mg  refill request received. Patient is 75 years old, weight-73.5kg, Crea-1.03 on 10/28/2020, Diagnosis-Afib, and last seen by Dr. 10/30/2020 on 11/10/2020. Dose is appropriate based on dosing criteria. Will send in refill to requested pharmacy.

## 2021-07-27 ENCOUNTER — Other Ambulatory Visit (HOSPITAL_BASED_OUTPATIENT_CLINIC_OR_DEPARTMENT_OTHER): Payer: 59

## 2021-08-09 ENCOUNTER — Telehealth (HOSPITAL_BASED_OUTPATIENT_CLINIC_OR_DEPARTMENT_OTHER): Payer: Self-pay | Admitting: *Deleted

## 2021-08-09 NOTE — Telephone Encounter (Signed)
Spoke with Shannon----patient's son Jenny Reichmann will call 08/10/21 to discuss rescheduling the Echocardiogram ordered by Dr. Justin Mend

## 2021-08-24 NOTE — Telephone Encounter (Signed)
Son has not called to discuss rescheduling----order will be cancelled

## 2021-09-12 DIAGNOSIS — R110 Nausea: Secondary | ICD-10-CM | POA: Diagnosis not present

## 2021-10-13 ENCOUNTER — Emergency Department (HOSPITAL_BASED_OUTPATIENT_CLINIC_OR_DEPARTMENT_OTHER): Payer: No Typology Code available for payment source

## 2021-10-13 ENCOUNTER — Inpatient Hospital Stay (HOSPITAL_BASED_OUTPATIENT_CLINIC_OR_DEPARTMENT_OTHER)
Admission: EM | Admit: 2021-10-13 | Discharge: 2021-10-15 | DRG: 191 | Disposition: A | Discharge status: Home / Self Care | Payer: No Typology Code available for payment source | Attending: Student | Admitting: Student

## 2021-10-13 ENCOUNTER — Encounter (HOSPITAL_BASED_OUTPATIENT_CLINIC_OR_DEPARTMENT_OTHER): Payer: Self-pay | Admitting: Emergency Medicine

## 2021-10-13 ENCOUNTER — Other Ambulatory Visit: Payer: Self-pay

## 2021-10-13 DIAGNOSIS — I272 Pulmonary hypertension, unspecified: Secondary | ICD-10-CM | POA: Diagnosis not present

## 2021-10-13 DIAGNOSIS — I509 Heart failure, unspecified: Secondary | ICD-10-CM

## 2021-10-13 DIAGNOSIS — T486X6A Underdosing of antiasthmatics, initial encounter: Secondary | ICD-10-CM | POA: Diagnosis not present

## 2021-10-13 DIAGNOSIS — Z801 Family history of malignant neoplasm of trachea, bronchus and lung: Secondary | ICD-10-CM | POA: Diagnosis not present

## 2021-10-13 DIAGNOSIS — I110 Hypertensive heart disease with heart failure: Secondary | ICD-10-CM | POA: Diagnosis present

## 2021-10-13 DIAGNOSIS — R6889 Other general symptoms and signs: Principal | ICD-10-CM

## 2021-10-13 DIAGNOSIS — F419 Anxiety disorder, unspecified: Secondary | ICD-10-CM | POA: Diagnosis not present

## 2021-10-13 DIAGNOSIS — Z7901 Long term (current) use of anticoagulants: Secondary | ICD-10-CM

## 2021-10-13 DIAGNOSIS — I252 Old myocardial infarction: Secondary | ICD-10-CM | POA: Diagnosis not present

## 2021-10-13 DIAGNOSIS — F17200 Nicotine dependence, unspecified, uncomplicated: Secondary | ICD-10-CM | POA: Diagnosis present

## 2021-10-13 DIAGNOSIS — M549 Dorsalgia, unspecified: Secondary | ICD-10-CM | POA: Diagnosis present

## 2021-10-13 DIAGNOSIS — Z66 Do not resuscitate: Secondary | ICD-10-CM | POA: Diagnosis not present

## 2021-10-13 DIAGNOSIS — E1165 Type 2 diabetes mellitus with hyperglycemia: Secondary | ICD-10-CM | POA: Diagnosis present

## 2021-10-13 DIAGNOSIS — J441 Chronic obstructive pulmonary disease with (acute) exacerbation: Secondary | ICD-10-CM | POA: Diagnosis not present

## 2021-10-13 DIAGNOSIS — I4821 Permanent atrial fibrillation: Secondary | ICD-10-CM | POA: Diagnosis present

## 2021-10-13 DIAGNOSIS — I5032 Chronic diastolic (congestive) heart failure: Secondary | ICD-10-CM

## 2021-10-13 DIAGNOSIS — R059 Cough, unspecified: Secondary | ICD-10-CM | POA: Diagnosis not present

## 2021-10-13 DIAGNOSIS — I5042 Chronic combined systolic (congestive) and diastolic (congestive) heart failure: Secondary | ICD-10-CM | POA: Diagnosis not present

## 2021-10-13 DIAGNOSIS — R0682 Tachypnea, not elsewhere classified: Secondary | ICD-10-CM | POA: Diagnosis not present

## 2021-10-13 DIAGNOSIS — Z20822 Contact with and (suspected) exposure to covid-19: Secondary | ICD-10-CM | POA: Diagnosis not present

## 2021-10-13 DIAGNOSIS — Z91128 Patient's intentional underdosing of medication regimen for other reason: Secondary | ICD-10-CM

## 2021-10-13 DIAGNOSIS — F17210 Nicotine dependence, cigarettes, uncomplicated: Secondary | ICD-10-CM | POA: Diagnosis not present

## 2021-10-13 DIAGNOSIS — E119 Type 2 diabetes mellitus without complications: Secondary | ICD-10-CM

## 2021-10-13 DIAGNOSIS — T380X5A Adverse effect of glucocorticoids and synthetic analogues, initial encounter: Secondary | ICD-10-CM | POA: Diagnosis not present

## 2021-10-13 DIAGNOSIS — J101 Influenza due to other identified influenza virus with other respiratory manifestations: Secondary | ICD-10-CM | POA: Diagnosis not present

## 2021-10-13 DIAGNOSIS — I10 Essential (primary) hypertension: Secondary | ICD-10-CM

## 2021-10-13 DIAGNOSIS — G8929 Other chronic pain: Secondary | ICD-10-CM

## 2021-10-13 DIAGNOSIS — Z820 Family history of epilepsy and other diseases of the nervous system: Secondary | ICD-10-CM

## 2021-10-13 DIAGNOSIS — I4811 Longstanding persistent atrial fibrillation: Secondary | ICD-10-CM | POA: Diagnosis present

## 2021-10-13 DIAGNOSIS — I4819 Other persistent atrial fibrillation: Secondary | ICD-10-CM | POA: Diagnosis not present

## 2021-10-13 DIAGNOSIS — Z7984 Long term (current) use of oral hypoglycemic drugs: Secondary | ICD-10-CM | POA: Diagnosis not present

## 2021-10-13 DIAGNOSIS — R0902 Hypoxemia: Secondary | ICD-10-CM | POA: Diagnosis present

## 2021-10-13 DIAGNOSIS — Z9981 Dependence on supplemental oxygen: Secondary | ICD-10-CM

## 2021-10-13 DIAGNOSIS — E785 Hyperlipidemia, unspecified: Secondary | ICD-10-CM | POA: Diagnosis not present

## 2021-10-13 DIAGNOSIS — R0602 Shortness of breath: Secondary | ICD-10-CM | POA: Diagnosis not present

## 2021-10-13 DIAGNOSIS — Z79899 Other long term (current) drug therapy: Secondary | ICD-10-CM | POA: Diagnosis not present

## 2021-10-13 DIAGNOSIS — F418 Other specified anxiety disorders: Secondary | ICD-10-CM | POA: Diagnosis present

## 2021-10-13 DIAGNOSIS — I4891 Unspecified atrial fibrillation: Secondary | ICD-10-CM | POA: Diagnosis not present

## 2021-10-13 LAB — COMPREHENSIVE METABOLIC PANEL
ALT: 11 U/L (ref 0–44)
AST: 14 U/L — ABNORMAL LOW (ref 15–41)
Albumin: 4.6 g/dL (ref 3.5–5.0)
Alkaline Phosphatase: 101 U/L (ref 38–126)
Anion gap: 12 (ref 5–15)
BUN: 14 mg/dL (ref 8–23)
CO2: 30 mmol/L (ref 22–32)
Calcium: 9.6 mg/dL (ref 8.9–10.3)
Chloride: 93 mmol/L — ABNORMAL LOW (ref 98–111)
Creatinine, Ser: 0.92 mg/dL (ref 0.44–1.00)
GFR, Estimated: >60 mL/min (ref >60)
Glucose, Bld: 153 mg/dL — ABNORMAL HIGH (ref 70–99)
Potassium: 3.4 mmol/L — ABNORMAL LOW (ref 3.5–5.1)
Sodium: 135 mmol/L (ref 135–145)
Total Bilirubin: 0.5 mg/dL (ref 0.3–1.2)
Total Protein: 8 g/dL (ref 6.5–8.1)

## 2021-10-13 LAB — RESP PANEL BY RT-PCR (FLU A&B, COVID) ARPGX2
Influenza A by PCR: NEGATIVE
Influenza B by PCR: NEGATIVE
SARS Coronavirus 2 by RT PCR: NEGATIVE

## 2021-10-13 LAB — CBC
HCT: 47.1 % — ABNORMAL HIGH (ref 36.0–46.0)
Hemoglobin: 14.9 g/dL (ref 12.0–15.0)
MCH: 26.9 pg (ref 26.0–34.0)
MCHC: 31.6 g/dL (ref 30.0–36.0)
MCV: 85.2 fL (ref 80.0–100.0)
Platelets: 322 10*3/uL (ref 150–400)
RBC: 5.53 MIL/uL — ABNORMAL HIGH (ref 3.87–5.11)
RDW: 14.4 % (ref 11.5–15.5)
WBC: 10.8 10*3/uL — ABNORMAL HIGH (ref 4.0–10.5)
nRBC: 0 % (ref 0.0–0.2)

## 2021-10-13 LAB — LACTIC ACID, PLASMA: Lactic Acid, Venous: 1.1 mmol/L (ref 0.5–1.9)

## 2021-10-13 LAB — LIPASE, BLOOD: Lipase: 18 U/L (ref 11–51)

## 2021-10-13 MED ORDER — METHYLPREDNISOLONE SODIUM SUCC 125 MG IJ SOLR
125.0000 mg | Freq: Once | INTRAMUSCULAR | Status: AC
  Administered 2021-10-13: 125 mg via INTRAVENOUS
  Filled 2021-10-13: qty 2

## 2021-10-13 MED ORDER — LEVALBUTEROL HCL 0.63 MG/3ML IN NEBU: 0.6300 mg | INHALATION_SOLUTION | Freq: Four times a day (QID) | RESPIRATORY_TRACT | Status: DC | PRN

## 2021-10-13 MED ORDER — POTASSIUM CHLORIDE 20 MEQ PO PACK
40.0000 meq | PACK | Freq: Once | ORAL | Status: AC
  Administered 2021-10-13: 40 meq via ORAL
  Filled 2021-10-13: qty 2

## 2021-10-13 MED ORDER — ENSURE ENLIVE PO LIQD
237.0000 mL | Freq: Two times a day (BID) | ORAL | Status: DC
  Administered 2021-10-14 – 2021-10-15 (×3): 237 mL via ORAL

## 2021-10-13 MED ORDER — SODIUM CHLORIDE 0.9 % IV SOLN: INTRAVENOUS | Status: DC

## 2021-10-13 MED ORDER — AZITHROMYCIN 250 MG PO TABS: 500.0000 mg | ORAL_TABLET | Freq: Every day | ORAL | Status: DC

## 2021-10-13 MED ORDER — SENNOSIDES-DOCUSATE SODIUM 8.6-50 MG PO TABS
1.0000 | ORAL_TABLET | Freq: Every evening | ORAL | Status: DC | PRN
  Administered 2021-10-14: 1 via ORAL
  Filled 2021-10-13: qty 1

## 2021-10-13 MED ORDER — ACETAMINOPHEN 325 MG PO TABS
650.0000 mg | ORAL_TABLET | Freq: Four times a day (QID) | ORAL | Status: DC | PRN
  Administered 2021-10-14: 650 mg via ORAL
  Filled 2021-10-13: qty 2

## 2021-10-13 MED ORDER — GUAIFENESIN ER 600 MG PO TB12: 600.0000 mg | ORAL_TABLET | Freq: Two times a day (BID) | ORAL | Status: DC | PRN

## 2021-10-13 MED ORDER — PRAVASTATIN SODIUM 20 MG PO TABS
20.0000 mg | ORAL_TABLET | Freq: Every day | ORAL | Status: DC
  Administered 2021-10-13 – 2021-10-15 (×3): 20 mg via ORAL
  Filled 2021-10-13 (×3): qty 1

## 2021-10-13 MED ORDER — METHYLPREDNISOLONE SODIUM SUCC 40 MG IJ SOLR
40.0000 mg | Freq: Two times a day (BID) | INTRAMUSCULAR | Status: DC
  Administered 2021-10-14 – 2021-10-15 (×3): 40 mg via INTRAVENOUS
  Filled 2021-10-13 (×3): qty 1

## 2021-10-13 MED ORDER — DILTIAZEM HCL-DEXTROSE 125-5 MG/125ML-% IV SOLN (PREMIX)
5.0000 mg/h | INTRAVENOUS | Status: DC
  Administered 2021-10-13: 5 mg/h via INTRAVENOUS
  Administered 2021-10-14 (×2): 7.5 mg/h via INTRAVENOUS
  Filled 2021-10-13 (×3): qty 125

## 2021-10-13 MED ORDER — SODIUM CHLORIDE 0.9% FLUSH
3.0000 mL | Freq: Two times a day (BID) | INTRAVENOUS | Status: DC
  Administered 2021-10-14 (×2): 3 mL via INTRAVENOUS

## 2021-10-13 MED ORDER — APIXABAN 5 MG PO TABS
5.0000 mg | ORAL_TABLET | Freq: Two times a day (BID) | ORAL | Status: DC
  Administered 2021-10-13 – 2021-10-15 (×4): 5 mg via ORAL
  Filled 2021-10-13 (×4): qty 1

## 2021-10-13 MED ORDER — DIGOXIN 125 MCG PO TABS
0.1250 mg | ORAL_TABLET | Freq: Every day | ORAL | Status: DC
  Administered 2021-10-13 – 2021-10-15 (×3): 0.125 mg via ORAL
  Filled 2021-10-13 (×3): qty 1

## 2021-10-13 MED ORDER — ACETAMINOPHEN 650 MG RE SUPP: 650.0000 mg | Freq: Four times a day (QID) | RECTAL | Status: DC | PRN

## 2021-10-13 MED ORDER — IPRATROPIUM-ALBUTEROL 0.5-2.5 (3) MG/3ML IN SOLN
3.0000 mL | Freq: Four times a day (QID) | RESPIRATORY_TRACT | Status: DC
  Administered 2021-10-13 – 2021-10-14 (×2): 3 mL via RESPIRATORY_TRACT
  Filled 2021-10-13 (×2): qty 3

## 2021-10-13 MED ORDER — ONDANSETRON HCL 4 MG/2ML IJ SOLN: 4.0000 mg | Freq: Four times a day (QID) | INTRAMUSCULAR | Status: DC | PRN

## 2021-10-13 MED ORDER — ONDANSETRON HCL 4 MG PO TABS: 4.0000 mg | ORAL_TABLET | Freq: Four times a day (QID) | ORAL | Status: DC | PRN

## 2021-10-13 MED ORDER — SODIUM CHLORIDE 0.9 % IV SOLN: 500.0000 mg | INTRAVENOUS | Status: DC

## 2021-10-13 MED ORDER — DOXYCYCLINE HYCLATE 100 MG PO TABS
100.0000 mg | ORAL_TABLET | Freq: Two times a day (BID) | ORAL | Status: DC
  Administered 2021-10-13 – 2021-10-15 (×4): 100 mg via ORAL
  Filled 2021-10-13 (×4): qty 1

## 2021-10-13 MED ORDER — PANTOPRAZOLE SODIUM 40 MG PO TBEC
40.0000 mg | DELAYED_RELEASE_TABLET | Freq: Every day | ORAL | Status: DC
  Administered 2021-10-14 – 2021-10-15 (×2): 40 mg via ORAL
  Filled 2021-10-13 (×2): qty 1

## 2021-10-13 MED ORDER — HYDROCODONE-ACETAMINOPHEN 10-325 MG PO TABS
1.0000 | ORAL_TABLET | Freq: Four times a day (QID) | ORAL | Status: DC | PRN
  Administered 2021-10-13 – 2021-10-15 (×6): 1 via ORAL
  Filled 2021-10-13 (×6): qty 1

## 2021-10-13 MED ORDER — DILTIAZEM HCL 25 MG/5ML IV SOLN
10.0000 mg | Freq: Once | INTRAVENOUS | Status: AC
  Administered 2021-10-13: 10 mg via INTRAVENOUS
  Filled 2021-10-13: qty 5

## 2021-10-13 MED ORDER — FUROSEMIDE 40 MG PO TABS
40.0000 mg | ORAL_TABLET | ORAL | Status: DC
  Administered 2021-10-14: 40 mg via ORAL
  Filled 2021-10-13: qty 1

## 2021-10-13 MED ORDER — DULOXETINE HCL 60 MG PO CPEP
120.0000 mg | ORAL_CAPSULE | Freq: Every day | ORAL | Status: DC
  Administered 2021-10-13 – 2021-10-15 (×3): 120 mg via ORAL
  Filled 2021-10-13 (×3): qty 2

## 2021-10-13 NOTE — Assessment & Plan Note (Signed)
Continue pravastatin 

## 2021-10-13 NOTE — Assessment & Plan Note (Addendum)
Likely due to ongoing cigarette smoking.  She is only on albuterol that she has not been using.  Respiratory symptoms improved.  Ambulated on room air and maintained appropriate saturation. ?-Discharged on p.o. prednisone and doxycycline for 3 more days ?-Started on Anoro Ellipta.  Renewed home Rx for albuterol ?-Counseled on smoking cessation but not ready. ?

## 2021-10-13 NOTE — Assessment & Plan Note (Addendum)
Started back smoking 1 month ago, about a pack per day.  Counseled on the importance of smoking cessation but she does not seem ready. ?

## 2021-10-13 NOTE — ED Provider Notes (Addendum)
[facility] EMERGENCY DEPT Provider Note   CSN: [id number] Arrival date & time: 10/13/21  1231     History  Chief Complaint  Patient presents with   Shortness of Breath    Theresa Mendoza is a 76 y.o. female.  Patient sent in from [place] urgent care.  Patient started feeling bad yesterday.  Had fevers and had a significant cough all night long with a lot of congestion.  Patient has a history of atrial fibrillation and has a history of bifascicular heart block.  They gave her albuterol there because she was apparently wheezing.  Patient normally on oxygen 1-1/2 L as needed at home but does not use it all the time.  Hypoxic on arrival here with oxygen sats around 89%.  Patient quite tachycardic with heart rate going from the 120s to the 140s.  It was irregular.  Patient ill-appearing.  Patient is on long-term Eliquis.  Patient is on Cardizem 24-hour tablet 240 mg.  Past medical history significant for bipolar disorder chronic atrial fibrillation COPD mitral regurg rheumatoid arthritis and patient is still smoker.      Home Medications Prior to Admission medications   Medication Sig Start Date End Date Taking? Authorizing Provider  albuterol (PROVENTIL HFA;VENTOLIN HFA) 108 (90 Base) MCG/ACT inhaler Inhale 2 puffs into the lungs every 6 (six) hours as needed for wheezing or shortness of breath. 08/24/16   de Dios, [place] A, MD  ALPRAZolam Prudy Feeler) 0.25 MG tablet Take 0.25 mg by mouth daily as needed. 10/20/20   [provider]  digoxin (LANOXIN) 0.125 MG tablet Take 1 tablet (0.125 mg total) by mouth daily. 12/03/20   Quintella Reichert, MD  diltiazem (CARDIZEM CD) 240 MG 24 hr capsule Take 240 mg by mouth daily. 11/01/15   [provider]  DULoxetine (CYMBALTA) 60 MG capsule TAKE 2 CAPSULES BY MOUTH EVERY DAY Patient taking differently: Take 120 mg by mouth daily. 10/09/19   Plovsky, Earvin Hansen, MD  ELIQUIS 5 MG TABS tablet TAKE 1 TABLET BY MOUTH TWICE A DAY  06/13/21   Quintella Reichert, MD  empagliflozin (JARDIANCE) 10 MG TABS tablet Take 1 tablet (10 mg total) by mouth daily before breakfast. 10/19/20   Angelita Ingles, MD  furosemide (LASIX) 40 MG tablet Take 1 tablet (40 mg total) by mouth every other day. 10/20/20   Angelita Ingles, MD  HYDROcodone-acetaminophen (NORCO) 10-325 MG tablet Take 1 tablet by mouth 5 (five) times daily as needed for moderate pain or severe pain.    [provider]  metoprolol succinate (TOPROL-XL) 25 MG 24 hr tablet Take 3 tablets (75 mg total) by mouth daily. 11/10/20   Quintella Reichert, MD  omeprazole (PRILOSEC) 40 MG capsule Take 40 mg by mouth in the morning. Before breakfast 09/07/20   [provider]  pravastatin (PRAVACHOL) 20 MG tablet Take 20 mg by mouth daily. 09/07/20   [provider]      Allergies    Lithium and Tegretol [carbamazepine]    Review of Systems   Review of Systems  Constitutional:  Positive for fever. Negative for chills.  HENT:  Positive for congestion. Negative for ear pain and sore throat.   Eyes:  Negative for pain and visual disturbance.  Respiratory:  Positive for cough and wheezing. Negative for shortness of breath.   Cardiovascular:  Positive for palpitations. Negative for chest pain.  Gastrointestinal:  Negative for abdominal pain and vomiting.  Genitourinary:  Negative for dysuria and hematuria.  Musculoskeletal:  Negative for arthralgias and back pain.  Skin:  Negative for color change and rash.  Neurological:  Negative for seizures and syncope.  All other systems reviewed and are negative.  Physical Exam Updated Vital Signs BP (!) 188/98   Pulse 97   Temp 98.2 F (36.8 C) (Oral)   Resp (!) 22   Ht 1.626 m (5\' 4" )   Wt 73.5 kg   SpO2 93%   BMI 27.81 kg/m  Physical Exam Vitals and nursing note reviewed.  Constitutional:      General: She is not in acute distress.    Appearance: Normal appearance. She is well-developed.  HENT:     Head:  Normocephalic and atraumatic.     Mouth/Throat:     Mouth: Mucous membranes are dry.  Eyes:     Extraocular Movements: Extraocular movements intact.     Conjunctiva/sclera: Conjunctivae normal.     Pupils: Pupils are equal, round, and reactive to light.  Cardiovascular:     Rate and Rhythm: Tachycardia present. Rhythm irregular.     Heart sounds: No murmur heard. Pulmonary:     Effort: Respiratory distress present.     Breath sounds: Rhonchi present.  Abdominal:     Palpations: Abdomen is soft.     Tenderness: There is no abdominal tenderness.  Musculoskeletal:        General: No swelling.     Cervical back: Neck supple.  Skin:    General: Skin is warm and dry.     Capillary Refill: Capillary refill takes less than 2 seconds.  Neurological:     General: No focal deficit present.     Mental Status: She is alert and oriented to person, place, and time.  Psychiatric:        Mood and Affect: Mood normal.    ED Results / Procedures / Treatments   Labs (all labs ordered are listed, but only abnormal results are displayed) Labs Reviewed  CBC - Abnormal; Notable for the following components:      Result Value   WBC 10.8 (*)    RBC 5.53 (*)    HCT 47.1 (*)    All other components within normal limits  COMPREHENSIVE METABOLIC PANEL - Abnormal; Notable for the following components:   Potassium 3.4 (*)    Chloride 93 (*)    Glucose, Bld 153 (*)    AST 14 (*)    All other components within normal limits  RESP PANEL BY RT-PCR (FLU A&B, COVID) ARPGX2  CULTURE, BLOOD (ROUTINE X 2)  CULTURE, BLOOD (ROUTINE X 2)  LIPASE, BLOOD  LACTIC ACID, PLASMA    EKG EKG Interpretation  Date/Time:  Thursday October 13 2021 12:43:12 EDT Ventricular Rate:  138 PR Interval:  126 QRS Duration: 138 QT Interval:  335 QTC Calculation: 508 R Axis:   -82 Text Interpretation: Atrial fibrillation Multiple premature complexes, vent & supraven Left atrial enlargement Right bundle branch block LVH  with IVCD and secondary repol abnrm Prolonged QT interval And history of bifascicular heart block. Confirmed by Vanetta Mulders [phone]) on 10/13/2021 12:51:06 PM  Radiology DG Chest Port 1 View  Result Date: 10/13/2021 CLINICAL DATA:  Shortness of breath and cough for 2 days in a 76 year old. EXAM: PORTABLE CHEST 1 VIEW COMPARISON:  October 07, 2020. FINDINGS: EKG leads project over the chest. Lungs are clear. No sign of effusion or pneumothorax on frontal radiograph. On limited assessment there is no acute skeletal finding IMPRESSION: No acute cardiopulmonary disease. Electronically  Signed   By: Donzetta Kohut M.D.   On: 10/13/2021 13:30    Procedures Procedures    Medications Ordered in ED Medications  0.9 %  sodium chloride infusion ( Intravenous New Bag/Given 10/13/21 1328)  diltiazem (CARDIZEM) 125 mg in dextrose 5% 125 mL (1 mg/mL) infusion (5 mg/hr Intravenous New Bag/Given 10/13/21 1328)  diltiazem (CARDIZEM) injection 10 mg (10 mg Intravenous Given 10/13/21 1317)    ED Course/ Medical Decision Making/ A&P                           Medical Decision Making Amount and/or Complexity of Data Reviewed Labs: ordered. Radiology: ordered.  Risk Prescription drug management. Decision regarding hospitalization.   CRITICAL CARE Performed by: Vanetta Mulders Total critical care time: 60 minutes Critical care time was exclusive of separately billable procedures and treating other patients. Critical care was necessary to treat or prevent imminent or life-threatening deterioration. Critical care was time spent personally by me on the following activities: development of treatment plan with patient and/or surrogate as well as nursing, discussions with consultants, evaluation of patient's response to treatment, examination of patient, obtaining history from patient or surrogate, ordering and performing treatments and interventions, ordering and review of laboratory studies, ordering and review  of radiographic studies, pulse oximetry and re-evaluation of patient's condition.  Patient clearly in rapid atrial fibrillation with heart rate sometimes going up into the 140s.  It is consistent with atrial fibrillation.  Patient has a pre-existing bifascicular heart block which gives a widened complex.  Not consistent with ventricular tachycardia.  Patient with a pretty horrific cough.  Oxygen requirement at all times oxygen level on room air 89%.  On the 2 L is in the low 90s.  Patient gives a history of fever concerns concerning for flulike illness.  Will check for COVID and influenza.  Not meeting exact septic parameters here not febrile here white count here is 10.9.  Lactic acid pending cultures were done.  Complete metabolic panel significant for potassium slightly low at 3.4 glucose of 153 liver function test are normal and renal function is normal with a GFR greater than 60.  Lipase is pending.  Chest x-ray no acute cardiopulmonary disease.  For the atrial fibrillation patient started on diltiazem 10 mg IV and then diltiazem drip to help control the rate.  Patient will need admission at least for the rapid atrial fibrillation.  But still suspicious that either this is a fairly significant COPD exacerbation or this is an acute viral upper respiratory infection or pneumonia but chest x-ray was negative.  Work-up COVID influenza testing negative.  Patient still with some slight wheezing but overall improved.  Will give IV Solu-Medrol.  I think part of this is an exacerbation of her COPD probably upper respiratory infection.  And of course the atrial for with RVR.  Patient is responding heart rate now is down into the low 100s.  But will need admission will discuss with hospitalist.  Patient's oxygen sats on the 2 L of oxygen is around 93%.  Final Clinical Impression(s) / ED Diagnoses Final diagnoses:  Flu-like symptoms  Atrial fibrillation with RVR [facility])    Rx / DC Orders ED Discharge Orders      None         Vanetta Mulders, MD 10/13/21 1417    Vanetta Mulders, MD 10/13/21 [id number]

## 2021-10-13 NOTE — Hospital Course (Addendum)
76 year old F with PMH of COPD and chronic RF on oxygen but not using, combined CHF (EF 40-45%), PAH, persistent A-fib on Eliquis, chronic back pain, tobacco use disorder, anxiety and depression presenting with progressive shortness of breath and productive cough, and admitted for COPD exacerbation and A-fib with RVR.  Patient is only on albuterol that she has not been using lately.  She also restarted smoking cigarettes about a pack a day lately due to some stressors at home. ?Started on systemic steroid, antibiotics and nebulizers for COPD exacerbation.  Started on Cardizem drip for A-fib with RVR. ? ?Patient's respiratory symptoms improved.  RVR resolved.  She was transitioned off IV Cardizem to home p.o. Cardizem CD.  Ambulated on room air and maintained appropriate saturation.  She is discharged on p.o. prednisone and doxycycline for 3 more days to complete course.  Started on [employer].  Counseled on smoking cessation.  ?

## 2021-10-13 NOTE — Assessment & Plan Note (Addendum)
Stable.   ?-Continue home Cymbalta and Xanax. ?

## 2021-10-13 NOTE — Assessment & Plan Note (Signed)
Continue Norco as needed with hold parameters. ?

## 2021-10-13 NOTE — Plan of Care (Signed)
  Problem: Clinical Measurements: Goal: Cardiovascular complication will be avoided Outcome: Progressing   Problem: Nutrition: Goal: Adequate nutrition will be maintained Outcome: Progressing   

## 2021-10-13 NOTE — H&P (Signed)
?History and Physical  ? ? ?Theresa Mendoza DOB: Jun 06, 1946 DOA: 10/13/2021 ? ?PCP: [name], MD  ?Patient coming from: Home ? ?I have personally briefly reviewed patient's old medical records in [facility] ? ?Chief Complaint: Cough, dyspnea ? ?HPI: ?Theresa Mendoza is a 76 y.o. female with medical history significant for COPD using supplemental oxygen as needed, chronic atrial fibrillation on Eliquis, chronic combined systolic and diastolic CHF (last EF 40-45%), pulmonary hypertension, hyperlipidemia, depression/anxiety, chronic back pain, and tobacco use who presented to the ED for evaluation of cough and dyspnea. ? ?Patient reports 2 days of worsening shortness of breath with new productive cough.  She had recently started back smoking about 1 month ago, around 1 pack/day.  She has had chest tightness and wheezing.  She states that she is not using any inhalers at home.  She has supplemental oxygen prescribed but has not been using it even when she is short of breath.  Patient initially went to her PCP and was given a breathing treatment and was subsequently routed to the ED for further evaluation. ? ?[facility] ED Course  Labs/Imaging on admission: I have personally reviewed following labs and imaging studies. ? ?Initial vitals showed BP 168/113, pulse 124, RR 25, temp 98.2 ?F, SPO2 91% on room air.  Patient was placed on 2 L O2 via [place] with SPO2 93%. ? ?Labs show WBC 10.8, hemoglobin 14.9, platelets 322,000, sodium 135, potassium 3.4, bicarb 30, BUN 14, creatinine 0.92, serum glucose 153, lipase 18, lactic acid 1.1. ? ?SARS-CoV-2 and influenza PCR negative.  Blood cultures in process. ? ?Portable chest x-ray negative for focal consolidation, edema, effusion. ? ?Patient was given 125 mg IV Solu-Medrol, 10 mg IV diltiazem followed by continuous infusion.  The hospitalist service was consulted to admit for further evaluation and management. ? ?Review of Systems: All  systems reviewed and are negative except as documented in history of present illness above. ? ? ?Past Medical History:  ?Diagnosis Date  ? Anxiety   ? Bipolar 1 disorder (HCC)   ? Bowel obstruction (HCC)   ? Cataract   ? Chronic atrial fibrillation (HCC) 09/2014  ? On chronic anticoagulation with coumadin.  She has failed several DCCVs in the past.  ? COPD (chronic obstructive pulmonary disease) (HCC)   ? Dyslipidemia, goal LDL below 70 12/25/2015  ? Encounter for tobacco use cessation counseling 12/04/2017  ? Glaucoma   ? Hx of migraine headaches   ? Memory loss   ? Mitral regurgitation   ? mild to moderate on echo 09/2014  ? Old MI (myocardial infarction) 09/2014  ? cath with normal coronary arteries and EF 50-55%  ? Osteoarthritis   ? Pulmonary HTN (HCC) 09/2014  ? Mild with PASP at cath  ? RBBB   ? Rheumatoid arthritis (HCC)   ? Tobacco abuse   ? ? ?Past Surgical History:  ?Procedure Laterality Date  ? ADENOIDECTOMY    ? BACK SURGERY    ? bowel obstruction    ? CARDIAC CATHETERIZATION  09/2014  ? normal coronary arteries with mild pulmonary HTN, low normal LVF  ? CHOLECYSTECTOMY    ? NOSE SURGERY    ? TONSILLECTOMY    ? TRANSESOPHAGEAL ECHOCARDIOGRAM WITH CARDIOVERSION  09/2014  ? VAGINAL HYSTERECTOMY    ? with BSO  ? ? ?Social History: ? reports that she has been smoking cigarettes. She has a 60.00 pack-year smoking history. She has never used smokeless tobacco. She reports that  she does not drink alcohol and does not use drugs. ? ?Allergies  ?Allergen Reactions  ? Lithium   ?  Patient became suicidal  ? Tegretol [Carbamazepine]   ?  UNK reaction  ? ? ?Family History  ?Problem Relation Age of Onset  ? Tuberculosis Father   ? Depression Father   ? Alzheimer's disease Mother   ? Lung cancer Mother   ? ? ? ?Prior to Admission medications   ?Medication Sig Start Date End Date Taking? Authorizing Provider  ?ALPRAZolam (XANAX) 0.25 MG tablet Take 0.25 mg by mouth daily as needed. 10/20/20  Yes [provider]   ?digoxin (LANOXIN) 0.125 MG tablet Take 1 tablet (0.125 mg total) by mouth daily. 12/03/20  Yes Turner, Cornelious Bryant, MD  ?diltiazem (CARDIZEM CD) 240 MG 24 hr capsule Take 240 mg by mouth daily. 11/01/15  Yes [provider]  ?DULoxetine (CYMBALTA) 60 MG capsule TAKE 2 CAPSULES BY MOUTH EVERY DAY ?Patient taking differently: Take 120 mg by mouth daily. 10/09/19  Yes Plovsky, Earvin Hansen, MD  ?ELIQUIS 5 MG TABS tablet TAKE 1 TABLET BY MOUTH TWICE A DAY 06/13/21  Yes Turner, Cornelious Bryant, MD  ?empagliflozin (JARDIANCE) 10 MG TABS tablet Take 1 tablet (10 mg total) by mouth daily before breakfast. 10/19/20  Yes Winfrey, Kimberlee Nearing, MD  ?furosemide (LASIX) 40 MG tablet Take 1 tablet (40 mg total) by mouth every other day. 10/20/20  Yes Angelita Ingles, MD  ?HYDROcodone-acetaminophen (NORCO) 10-325 MG tablet Take 1 tablet by mouth 5 (five) times daily as needed for moderate pain or severe pain.   Yes [provider]  ?losartan (COZAAR) 25 MG tablet Take 25 mg by mouth daily. 07/14/21  Yes [provider]  ?metoprolol succinate (TOPROL-XL) 25 MG 24 hr tablet Take 3 tablets (75 mg total) by mouth daily. 11/10/20  Yes Turner, Cornelious Bryant, MD  ?omeprazole (PRILOSEC) 40 MG capsule Take 40 mg by mouth in the morning. Before breakfast 09/07/20  Yes [provider]  ?pravastatin (PRAVACHOL) 20 MG tablet Take 20 mg by mouth daily. 09/07/20  Yes [provider]  ?albuterol (PROVENTIL HFA;VENTOLIN HFA) 108 (90 Base) MCG/ACT inhaler Inhale 2 puffs into the lungs every 6 (six) hours as needed for wheezing or shortness of breath. ?Patient not taking: Reported on 10/13/2021 08/24/16   [place], Hilton Cork, MD  ? ? ?Physical Exam: ?Vitals:  ? 10/13/21 1630 10/13/21 1655 10/13/21 1815 10/13/21 2020  ?BP: (!) 172/92 (!) 186/79 112/70   ?Pulse: (!) 117 75 98   ?Resp: (!) 21 (!) 21 20   ?Temp:   98.2 ?F (36.8 ?C)   ?TempSrc:   Oral   ?SpO2: 95% 93% 92% 98%  ?Weight:      ?Height:      ? ?Constitutional: Sitting up on the  side of the bed, NAD, calm, comfortable ?Eyes: PERRL, lids and conjunctivae normal ?ENMT: Mucous membranes are moist. Posterior pharynx clear of any exudate or lesions.Normal dentition.  ?Neck: normal, supple, no masses. ?Respiratory: Expiratory wheezing throughout.  Increased respiratory effort. No accessory muscle use.  ?Cardiovascular: Irregularly irregular, no murmurs / rubs / gallops. No extremity edema. 2+ pedal pulses. ?Abdomen: no tenderness, no masses palpated. No hepatosplenomegaly. Bowel sounds positive.  ?Musculoskeletal: no clubbing / cyanosis. No joint deformity upper and lower extremities. Good ROM, no contractures. Normal muscle tone.  ?Skin: no rashes, lesions, ulcers. No induration ?Neurologic: CN 2-12 grossly intact. Sensation intact. Strength 5/5 in all 4.  ?Psychiatric: Alert and  oriented x 3. Normal mood.  ? ?EKG: Personally reviewed. A-fib with RVR, rate 138, PVCs, RBBB.  Rate is faster when compared to prior. ? ?Assessment/Plan ?Principal Problem: ?  Acute exacerbation of chronic obstructive pulmonary disease (COPD) (HCC) ?Active Problems: ?  Persistent atrial fibrillation with rapid ventricular response (HCC) ?  Chronic combined systolic and diastolic CHF (congestive heart failure) (HCC) ?  Tobacco use disorder ?  Hyperlipidemia ?  Depression with anxiety ?  Chronic back pain ?  ?Theresa Mendoza is a 76 y.o. female with medical history significant for COPD using supplemental oxygen as needed, chronic atrial fibrillation on Eliquis, chronic combined systolic and diastolic CHF (last EF 40-45%), pulmonary hypertension, hyperlipidemia, depression/anxiety, chronic back pain, and tobacco use who is admitted with acute COPD exacerbation and A-fib with RVR. ? ?Assessment and Plan: ?* Acute exacerbation of chronic obstructive pulmonary disease (COPD) (HCC) ?Worsened since she started smoking again.  Not adherent to inhalers or supplemental oxygen at home.  Still with coarse wheezing throughout the  lung fields. ?-Continue IV Solu-Medrol 40 mg twice daily ?-Started on scheduled DuoNebs with Xopenex as needed ?-Start on doxycycline ?-Continue supplemental oxygen as needed ? ?Persistent atrial fibrillatio

## 2021-10-13 NOTE — Assessment & Plan Note (Addendum)
Appears euvolemic on exam.  TTE in 09/2020 with LVEF of 40-45%, GH, indeterminate DD, mildly elevated PASP.  On p.o. Lasix 40 mg at home.  Appears euvolemic on exam. ?-Advised to hold Lasix for 3 days until she completes steroid course. ?-Continue GDMT. ?

## 2021-10-13 NOTE — Assessment & Plan Note (Addendum)
Likely from COPD exacerbation.  Also suspect noncompliance.  RVR resolved. Discharged on home digoxin, Cardizem CD, Toprol-XL and Eliquis.  Counseled on the importance of compliance with meds. ?

## 2021-10-13 NOTE — ED Triage Notes (Signed)
Pt from home complains of SOB for the past few days accompanied with slightly productive cough. Pt is on PRN oxygen 1.5 L. ?

## 2021-10-13 NOTE — Plan of Care (Signed)
?  Problem: Clinical Measurements: ?Goal: Cardiovascular complication will be avoided ?Outcome: Progressing ?  ?Problem: Activity: ?Goal: Risk for activity intolerance will decrease ?Outcome: Progressing ?  ?Problem: Nutrition: ?Goal: Adequate nutrition will be maintained ?10/13/2021 1905 by Val Eagle, RN ?Outcome: Progressing ?10/13/2021 1905 by Val Eagle, RN ?Outcome: Progressing ?  ?

## 2021-10-14 DIAGNOSIS — E785 Hyperlipidemia, unspecified: Secondary | ICD-10-CM | POA: Diagnosis not present

## 2021-10-14 DIAGNOSIS — Z9981 Dependence on supplemental oxygen: Secondary | ICD-10-CM | POA: Diagnosis not present

## 2021-10-14 DIAGNOSIS — Z820 Family history of epilepsy and other diseases of the nervous system: Secondary | ICD-10-CM | POA: Diagnosis not present

## 2021-10-14 DIAGNOSIS — J441 Chronic obstructive pulmonary disease with (acute) exacerbation: Secondary | ICD-10-CM | POA: Diagnosis present

## 2021-10-14 DIAGNOSIS — I5042 Chronic combined systolic (congestive) and diastolic (congestive) heart failure: Secondary | ICD-10-CM

## 2021-10-14 DIAGNOSIS — E1165 Type 2 diabetes mellitus with hyperglycemia: Secondary | ICD-10-CM | POA: Diagnosis not present

## 2021-10-14 DIAGNOSIS — I10 Essential (primary) hypertension: Secondary | ICD-10-CM

## 2021-10-14 DIAGNOSIS — I4819 Other persistent atrial fibrillation: Secondary | ICD-10-CM

## 2021-10-14 DIAGNOSIS — M549 Dorsalgia, unspecified: Secondary | ICD-10-CM | POA: Diagnosis not present

## 2021-10-14 DIAGNOSIS — F419 Anxiety disorder, unspecified: Secondary | ICD-10-CM | POA: Diagnosis not present

## 2021-10-14 DIAGNOSIS — F418 Other specified anxiety disorders: Secondary | ICD-10-CM | POA: Diagnosis not present

## 2021-10-14 DIAGNOSIS — F17200 Nicotine dependence, unspecified, uncomplicated: Secondary | ICD-10-CM

## 2021-10-14 DIAGNOSIS — I252 Old myocardial infarction: Secondary | ICD-10-CM | POA: Diagnosis not present

## 2021-10-14 DIAGNOSIS — Z79899 Other long term (current) drug therapy: Secondary | ICD-10-CM | POA: Diagnosis not present

## 2021-10-14 DIAGNOSIS — R0902 Hypoxemia: Secondary | ICD-10-CM | POA: Diagnosis not present

## 2021-10-14 DIAGNOSIS — Z7901 Long term (current) use of anticoagulants: Secondary | ICD-10-CM | POA: Diagnosis not present

## 2021-10-14 DIAGNOSIS — Z91128 Patient's intentional underdosing of medication regimen for other reason: Secondary | ICD-10-CM | POA: Diagnosis not present

## 2021-10-14 DIAGNOSIS — G8929 Other chronic pain: Secondary | ICD-10-CM | POA: Diagnosis not present

## 2021-10-14 DIAGNOSIS — Z20822 Contact with and (suspected) exposure to covid-19: Secondary | ICD-10-CM | POA: Diagnosis not present

## 2021-10-14 DIAGNOSIS — M545 Low back pain, unspecified: Secondary | ICD-10-CM

## 2021-10-14 DIAGNOSIS — E119 Type 2 diabetes mellitus without complications: Secondary | ICD-10-CM | POA: Diagnosis not present

## 2021-10-14 DIAGNOSIS — T486X6A Underdosing of antiasthmatics, initial encounter: Secondary | ICD-10-CM | POA: Diagnosis not present

## 2021-10-14 DIAGNOSIS — I110 Hypertensive heart disease with heart failure: Secondary | ICD-10-CM | POA: Diagnosis not present

## 2021-10-14 DIAGNOSIS — Z7984 Long term (current) use of oral hypoglycemic drugs: Secondary | ICD-10-CM | POA: Diagnosis not present

## 2021-10-14 DIAGNOSIS — I272 Pulmonary hypertension, unspecified: Secondary | ICD-10-CM | POA: Diagnosis not present

## 2021-10-14 DIAGNOSIS — T380X5A Adverse effect of glucocorticoids and synthetic analogues, initial encounter: Secondary | ICD-10-CM | POA: Diagnosis not present

## 2021-10-14 DIAGNOSIS — Z801 Family history of malignant neoplasm of trachea, bronchus and lung: Secondary | ICD-10-CM | POA: Diagnosis not present

## 2021-10-14 DIAGNOSIS — Z66 Do not resuscitate: Secondary | ICD-10-CM | POA: Diagnosis not present

## 2021-10-14 DIAGNOSIS — F17210 Nicotine dependence, cigarettes, uncomplicated: Secondary | ICD-10-CM | POA: Diagnosis not present

## 2021-10-14 LAB — BASIC METABOLIC PANEL
Anion gap: 12 (ref 5–15)
BUN: 12 mg/dL (ref 8–23)
CO2: 24 mmol/L (ref 22–32)
Calcium: 9.2 mg/dL (ref 8.9–10.3)
Chloride: 99 mmol/L (ref 98–111)
Creatinine, Ser: 0.76 mg/dL (ref 0.44–1.00)
GFR, Estimated: >60 mL/min (ref >60)
Glucose, Bld: 200 mg/dL — ABNORMAL HIGH (ref 70–99)
Potassium: 3.8 mmol/L (ref 3.5–5.1)
Sodium: 135 mmol/L (ref 135–145)

## 2021-10-14 LAB — CBC
HCT: 48 % — ABNORMAL HIGH (ref 36.0–46.0)
Hemoglobin: 15.3 g/dL — ABNORMAL HIGH (ref 12.0–15.0)
MCH: 27.6 pg (ref 26.0–34.0)
MCHC: 31.9 g/dL (ref 30.0–36.0)
MCV: 86.5 fL (ref 80.0–100.0)
Platelets: 300 10*3/uL (ref 150–400)
RBC: 5.55 MIL/uL — ABNORMAL HIGH (ref 3.87–5.11)
RDW: 14.5 % (ref 11.5–15.5)
WBC: 8.1 10*3/uL (ref 4.0–10.5)
nRBC: 0 % (ref 0.0–0.2)

## 2021-10-14 LAB — HEMOGLOBIN A1C
Hgb A1c MFr Bld: 7.3 % — ABNORMAL HIGH (ref 4.8–5.6)
Mean Plasma Glucose: 162.81 mg/dL

## 2021-10-14 LAB — MAGNESIUM: Magnesium: 2.4 mg/dL (ref 1.7–2.4)

## 2021-10-14 LAB — DIGOXIN LEVEL: Digoxin Level: 0.6 ng/mL — ABNORMAL LOW (ref 0.8–2.0)

## 2021-10-14 LAB — GLUCOSE, CAPILLARY
Glucose-Capillary: 185 mg/dL — ABNORMAL HIGH (ref 70–99)
Glucose-Capillary: 183 mg/dL — ABNORMAL HIGH (ref 70–99)
Glucose-Capillary: 158 mg/dL — ABNORMAL HIGH (ref 70–99)
Glucose-Capillary: 209 mg/dL — ABNORMAL HIGH (ref 70–99)

## 2021-10-14 MED ORDER — INSULIN ASPART 100 UNIT/ML IJ SOLN
0.0000 [IU] | Freq: Every day | INTRAMUSCULAR | Status: DC
  Administered 2021-10-14: 2 [IU] via SUBCUTANEOUS

## 2021-10-14 MED ORDER — IPRATROPIUM BROMIDE 0.02 % IN SOLN
0.5000 mg | Freq: Four times a day (QID) | RESPIRATORY_TRACT | Status: DC
  Administered 2021-10-14 – 2021-10-15 (×4): 0.5 mg via RESPIRATORY_TRACT
  Filled 2021-10-14 (×6): qty 2.5

## 2021-10-14 MED ORDER — IPRATROPIUM-ALBUTEROL 0.5-2.5 (3) MG/3ML IN SOLN
3.0000 mL | RESPIRATORY_TRACT | Status: DC | PRN
  Administered 2021-10-14: 3 mL via RESPIRATORY_TRACT

## 2021-10-14 MED ORDER — LEVALBUTEROL HCL 0.63 MG/3ML IN NEBU
0.6300 mg | INHALATION_SOLUTION | Freq: Four times a day (QID) | RESPIRATORY_TRACT | Status: DC
  Administered 2021-10-14 – 2021-10-15 (×4): 0.63 mg via RESPIRATORY_TRACT
  Filled 2021-10-14 (×6): qty 3

## 2021-10-14 MED ORDER — EMPAGLIFLOZIN 10 MG PO TABS
10.0000 mg | ORAL_TABLET | Freq: Every day | ORAL | Status: DC
  Administered 2021-10-14 – 2021-10-15 (×2): 10 mg via ORAL
  Filled 2021-10-14 (×2): qty 1

## 2021-10-14 MED ORDER — METOPROLOL SUCCINATE ER 50 MG PO TB24
75.0000 mg | ORAL_TABLET | Freq: Every day | ORAL | Status: DC
  Administered 2021-10-14 – 2021-10-15 (×2): 75 mg via ORAL
  Filled 2021-10-14 (×2): qty 1

## 2021-10-14 MED ORDER — INSULIN ASPART 100 UNIT/ML IJ SOLN
0.0000 [IU] | Freq: Three times a day (TID) | INTRAMUSCULAR | Status: DC
  Administered 2021-10-14 – 2021-10-15 (×4): 2 [IU] via SUBCUTANEOUS

## 2021-10-14 MED ORDER — ALPRAZOLAM 0.25 MG PO TABS
0.2500 mg | ORAL_TABLET | Freq: Every day | ORAL | Status: DC | PRN
  Administered 2021-10-14: 0.25 mg via ORAL
  Filled 2021-10-14: qty 1

## 2021-10-14 MED ORDER — POTASSIUM CHLORIDE CRYS ER 20 MEQ PO TBCR
40.0000 meq | EXTENDED_RELEASE_TABLET | Freq: Once | ORAL | Status: AC
  Administered 2021-10-14: 40 meq via ORAL
  Filled 2021-10-14: qty 2

## 2021-10-14 MED ORDER — LOSARTAN POTASSIUM 25 MG PO TABS
25.0000 mg | ORAL_TABLET | Freq: Every day | ORAL | Status: DC
  Administered 2021-10-14 – 2021-10-15 (×2): 25 mg via ORAL
  Filled 2021-10-14 (×2): qty 1

## 2021-10-14 NOTE — Progress Notes (Signed)
SATURATION QUALIFICATIONS: (This note is used to comply with regulatory documentation for home oxygen) ? ?Patient Saturations on 2L at Rest = 94% ? ?Patient Saturations on 2 Liters of oxygen while Ambulating = 81% ? ?Patient ambulated 1250ft while on 2L O2. Patient became short of breath towards end of walk and dropped to 81%. Once returned to room, she recovered well and back to 94% on 2L.  ? ? ?

## 2021-10-14 NOTE — Progress Notes (Signed)
Mobility Specialist - Progress Note ? ? 10/14/21 1125  ?Oxygen Therapy  ?SpO2 98 %  ?O2 Device Nasal Cannula  ?O2 Flow Rate (L/min) 4 L/min  ?Mobility  ?Activity Ambulated with assistance in hallway  ?Level of Assistance Contact guard assist, steadying assist  ?Assistive Device None  ?Distance Ambulated (ft) 110 ft  ?Activity Response Tolerated well  ?$Mobility charge 1 Mobility  ? ?Pre-mobility: 89 HR, 98% SpO2 ?During mobility: 122 HR, 98%SpO2 ?Post-mobility: 94 HR, 99% SPO2 ? ?Pt agreeable to mobilize this morning, stated she was experiencing 7/10 back pain, but willing to move. No AD required when ambulating 138ft. She stayed connected to 4L of O2. Pt c/o of SOB, but O2 sats did not drop below 98%. Encouraged pt to practice pursed breathing prior to returning to room. Pt left sitting up in bed with all necessities within reach.  ? ?Theresa Laverdiere S. ?Mobility Specialist ?Acute Rehabilitation Services ?Phone: [phone] ?10/14/21, 11:32 AM ? ? ?

## 2021-10-14 NOTE — TOC Progression Note (Signed)
Transition of Care (TOC) - Progression Note  ? ? ?Patient Details  ?Name: Weldon PickingLinda W Zhan ?MRN: [id number] ?Date of Birth: August 01, 1945 ? ?Transition of Care (TOC) CM/SW Contact  ?Geni BersMcGibboney, Javonta Gronau, RN ?Phone Number: ?10/14/2021, 12:22 PM ? ?Clinical Narrative:    ? ?Spoke with pt concerning HH and O2. Pt has [place] at home, [place]. Pt states she will not need HH.  ? ?Expected Discharge Plan: Home/Self Care ?Barriers to Discharge: No Barriers Identified ? ?Expected Discharge Plan and Services ?Expected Discharge Plan: Home/Self Care ?  ?Discharge Planning Services: CM Consult ?Post Acute Care Choice: Durable Medical Equipment, Home Health ?Living arrangements for the past 2 months: Single Family Home ?                ?  ?  ?  ?  ?  ?  ?  ?  ?  ?  ? ? ?Social Determinants of Health (SDOH) Interventions ?  ? ?Readmission Risk Interventions ?   ? View : No data to display.  ?  ?  ?  ? ? ?

## 2021-10-14 NOTE — Progress Notes (Signed)
Nutrition Brief Note ? ?Patient identified on the Malnutrition Screening Tool (MST) Report with score of 2.0 ? ?Wt Readings from Last 15 Encounters:  ?10/14/21 72.6 kg  ?11/10/20 73.5 kg  ?10/19/20 73.4 kg  ?10/13/20 77.4 kg  ?12/04/17 68.9 kg  ?06/18/17 71.2 kg  ?11/02/16 74.8 kg  ?10/01/16 77.1 kg  ?08/24/16 76.1 kg  ?02/01/16 70.8 kg  ?12/24/15 70.8 kg  ?12/15/15 72.6 kg  ? ? ?Body mass index is 27.46 kg/m?Marland Kitchen Patient meets criteria for overweight based on current BMI. Stable weight for 11 months. Skin WDL.  ? ?Patient is Observation status. Admitting diagnoses of afib with RVR and COPD exacerbation.  ? ?Current diet order is Heart Healthy with no intakes documented. Labs and medications reviewed.  ? ?No nutrition interventions warranted at this time. If nutrition issues arise, please consult RD.  ? ? ? ? ? ?Jarome Matin, MS, RD, LDN ?Registered Dietitian II ?Inpatient Clinical Nutrition ?RD pager # and on-call/weekend pager # available in [place]  ? ? ?

## 2021-10-14 NOTE — Assessment & Plan Note (Addendum)
Improved.  Continue home Toprol-XL, Cardizem and losartan.  Resume Lasix after finishing prednisone course ?

## 2021-10-14 NOTE — Evaluation (Signed)
Occupational Therapy Evaluation ?Patient Details ?Name: Theresa PickingLinda W Mendoza ?MRN: [id number] ?DOB: [birth year] ?Today's Date: 10/14/2021 ? ? ?History of Present Illness 76 year old F with PMH of COPD and chronic RF on oxygen but not using, combined CHF (EF 40-45%), PAH, persistent A-fib on Eliquis, chronic back pain, tobacco use disorder, anxiety, depression and noncompliance presenting with progressive shortness of breath and productive cough, and admitted for COPD exacerbation and A-fib with RVR.  ? ?Clinical Impression ?  ?Theresa Mendoza is a 76 year old woman admitted to hospital with COPD exacerbation. On evaluation she demonstrates ability to ambulate, transfer and perform ADLs without assistance. She is resting on 2 L [place] but ambulated on RA to bathroom. O2 sat 89% with just in room ambulation. Therapist educated patient on energy conservation and provided patient with handout, recommended use of shower chair at home. Patient reports o2 line not long at home and can only really wear it while she is in her room - that it won't even reach to the bathroom. May need more o2 lines. She reports her machine isn't strong enough either. Recommend home oxygen company to re-evaluate patient's needs.   ?   ? ?Recommendations for follow up therapy are one component of a multi-disciplinary discharge planning process, led by the attending physician.  Recommendations may be updated based on patient status, additional functional criteria and insurance authorization.  ? ?Follow Up Recommendations ? No OT follow up  ?  ?Assistance Recommended at Discharge None  ?Patient can return home with the following   ? ?  ?Functional Status Assessment ? Patient has not had a recent decline in their functional status  ?Equipment Recommendations ? None recommended by OT  ?  ?Recommendations for Other Services   ? ? ?  ?Precautions / Restrictions Precautions ?Precautions: None  ? ?  ? ?Mobility Bed Mobility ?Overal bed mobility:  Independent ?  ?  ?  ?  ?  ?  ?  ?  ? ?Transfers ?Overall transfer level: Independent ?  ?  ?  ?  ?  ?  ?  ?  ?  ?  ? ?  ?Balance Overall balance assessment: No apparent balance deficits (not formally assessed) ?  ?  ?  ?  ?  ?  ?  ?  ?  ?  ?  ?  ?  ?  ?  ?  ?  ?  ?   ? ?ADL either performed or assessed with clinical judgement  ? ?ADL Overall ADL's : Independent ?  ?  ?  ?  ?  ?  ?  ?  ?  ?  ?  ?  ?  ?  ?  ?  ?  ?  ?  ?   ? ? ? ?Vision Patient Visual Report: No change from baseline ?   ?   ?Perception   ?  ?Praxis   ?  ? ?Pertinent Vitals/Pain Pain Assessment ?Pain Assessment: Faces ?Faces Pain Scale: Hurts little more ?Pain Location: headache ?Pain Descriptors / Indicators: Aching ?Pain Intervention(s): Patient requesting pain meds-RN notified  ? ? ? ?Hand Dominance Right ?  ?Extremity/Trunk Assessment Upper Extremity Assessment ?Upper Extremity Assessment: Overall WFL for tasks assessed ?  ?Lower Extremity Assessment ?Lower Extremity Assessment: Overall WFL for tasks assessed ?  ?Cervical / Trunk Assessment ?Cervical / Trunk Assessment: Normal ?  ?Communication Communication ?Communication: No difficulties ?  ?Cognition Arousal/Alertness: Awake/alert ?Behavior During Therapy: [facility] for tasks assessed/performed ?Overall Cognitive Status: Within Functional  Limits for tasks assessed ?  ?  ?  ?  ?  ?  ?  ?  ?  ?  ?  ?  ?  ?  ?  ?  ?  ?  ?  ?General Comments    ? ?  ?Exercises   ?  ?Shoulder Instructions    ? ? ?Home Living Family/patient expects to be discharged to:: Private residence ?Living Arrangements: Children ?Available Help at Discharge: Family;Available PRN/intermittently ?Type of Home: House ?Home Access: Level entry;Stairs to enter ?Entrance Stairs-Number of Steps: 2 ?  ?Home Layout: Two level ?  ?  ?Bathroom Shower/Tub: Walk-in shower ?  ?Bathroom Toilet: Standard ?  ?  ?Home Equipment: Gilmer Mor - single point;Tub bench;Shower seat ?  ?Additional Comments: has oxygen at home. Reports line doesn't reach the  bathroom. Predominantly can only have oxygen in bedroom. ?  ? ?  ?Prior Functioning/Environment Prior Level of Function : Independent/Modified Independent ?  ?  ?  ?  ?  ?  ?  ?  ?  ? ?  ?  ?OT Problem List: Cardiopulmonary status limiting activity ?  ?   ?OT Treatment/Interventions:    ?  ?OT Goals(Current goals can be found in the care plan section) Acute Rehab OT Goals ?OT Goal Formulation: All assessment and education complete, DC therapy  ?OT Frequency:   ?  ? ?Co-evaluation   ?  ?  ?  ?  ? ?  ?AM-PAC OT "6 Clicks" Daily Activity     ?Outcome Measure Help from another person eating meals?: None ?Help from another person taking care of personal grooming?: None ?Help from another person toileting, which includes using toliet, bedpan, or urinal?: None ?Help from another person bathing (including washing, rinsing, drying)?: None ?Help from another person to put on and taking off regular upper body clothing?: None ?Help from another person to put on and taking off regular lower body clothing?: None ?6 Click Score: 24 ?  ?End of Session Nurse Communication: Patient requests pain meds ? ?Activity Tolerance: Patient tolerated treatment well ?Patient left: in bed;with call bell/phone within reach ? ?OT Visit Diagnosis: Muscle weakness (generalized) (M62.81)  ?              ?Time: 1610-9604 ?OT Time Calculation (min): 21 min ?Charges:  OT General Charges ?$OT Visit: 1 Visit ?OT Evaluation ?$OT Eval Low Complexity: 1 Low ? ?Theresa Mendoza, OTR/L ?Acute Care Rehab Services  ?Office [phone] ?Pager: [phone]  ? ?Theresa Mendoza ?10/14/2021, 4:30 PM ?

## 2021-10-14 NOTE — Assessment & Plan Note (Addendum)
Hemoglobin A1c 7.3%.  She is on Jardiance at home likely for CHF.  Hyperglycemia likely due to steroid. ?Recent Labs  ?Lab 10/14/21 ?1146 10/14/21 ?1713 10/14/21 ?2052 10/15/21 ?0867 10/15/21 ?1131  ?GLUCAP 183* 158* 209* 173* 198*  ?-Continue home Jardiance.  Added metformin. ?-Hold home Lasix until she completes prednisone course. ? ?

## 2021-10-14 NOTE — Progress Notes (Signed)
?PROGRESS NOTE ? ?Theresa Mendoza [id number] DOB: [birth year]  ? ?PCP: Lujean Amel, MD ? ?Patient is from: Home.  Lives with family. ? ?DOA: 10/13/2021 LOS: 0 ? ?Chief complaints ?Chief Complaint  ?Patient presents with  ? Shortness of Breath  ?  ? ?Brief Narrative / Interim history: ?76 year old F with PMH of COPD and chronic RF on oxygen but not using, combined CHF (EF 40-45%), PAH, persistent A-fib on Eliquis, chronic back pain, tobacco use disorder, anxiety, depression and noncompliance presenting with progressive shortness of breath and productive cough, and admitted for COPD exacerbation and A-fib with RVR.  Patient was not compliant with her inhalers.  Also restarted smoking cigarettes about a pack a day lately due to some stressors at home. ?Started on systemic steroid, antibiotics and nebulizers for COPD exacerbation.  Started on Cardizem drip for A-fib with RVR.  ? ?Subjective: ?Seen and examined earlier this morning.  No major events overnight or this morning.  Reports improvement in her breathing but still with chest tightness and productive cough.  Denies palpitation or dizziness.  Still in RVR with HR in low 100s.  She denies GI or UTI symptoms.  Counseled on compliance with medication and tobacco cessation.  She is not interested in medication to help with tobacco cessation.  She has successfully quit in the past.  She reports some stressors at home.  She lives with her son and daughter-in-law. ? ?Objective: ?Vitals:  ? 10/14/21 0500 10/14/21 0617 10/14/21 0837 10/14/21 0900  ?BP:  (!) 181/90    ?Pulse:  (!) 105    ?Resp:  18 (!) 31   ?Temp:  98 ?F (36.7 ?C)    ?TempSrc:  Oral    ?SpO2:  94% 94%   ?Weight: 73.5 kg   72.6 kg  ?Height:    5\' 4"  (1.626 m)  ? ? ?Examination: ? ?GENERAL: No apparent distress.  Nontoxic. ?HEENT: MMM.  Vision and hearing grossly intact.  ?NECK: Supple.  No apparent JVD.  ?RESP: 94% on 4 L.  No IWOB.  Fair aeration bilaterally. ?CVS: IR IR.  Heart sounds normal.   ?ABD/GI/GU: BS+. Abd soft, NTND.  ?MSK/EXT:  Moves extremities. No apparent deformity. No edema.  ?SKIN: no apparent skin lesion or wound ?NEURO: Awake, alert and oriented appropriately.  No apparent focal neuro deficit. ?PSYCH: Calm. Normal affect.  ? ?Procedures:  ?None ? ?Microbiology summarized: ?COVID-19 and influenza PCR nonreactive. ?Blood cultures NGTD. ? ?Assessment and Plan: ?* Acute exacerbation of chronic obstructive pulmonary disease (COPD) ([place]) ?Reports some improvement but still with chest tightness, productive cough and some shortness of breath.  CXR consistent with COPD.  No documented desaturation but saturating in low 90s on 4 L by Junior.  COPD exacerbation likely due to noncompliance with inhalers and cigarette smoke she restarted. ?-Continue IV Solu-Medrol 40 mg twice daily ?-As scheduled Xopenex and Atrovent given A-fib with RVR ?-Changed DuoNeb to as needed ?-Continue doxycycline ?-Wean oxygen as able.  Minimum oxygen to keep saturation above 88% ?-IS/OOB/ambulatory saturation/PT/OT ?-Counseled on the importance of compliance and smoking cessation. ?-She is not interested in nicotine patch. ? ?Persistent atrial fibrillation with rapid ventricular response ([place]) ?Likely worsened in setting of COPD exacerbation and noncompliance.  Digoxin level low at 0.6.  Remains in RVR with HR in low 100s ?-Continue IV Cardizem drip.  Will transition to p.o. Cardizem as able ?-Resume home Toprol-XL ?-Changed to scheduled DuoNeb to Xopenex and Atrovent ?-Continue home dose digoxin 0.125 mg daily ?-Continue Eliquis for anticoagulation ?-  Optimize Mg and K. ?-Check TSH. ? ?Diabetes mellitus type II, non insulin dependent ([place]) ?Hemoglobin A1c 7.3%.  She is on Jardiance at home likely for CHF.  ?Recent Labs  ?Lab 10/14/21 ?0750  ?GLUCAP 185*  ?-Start SSI and CBG monitoring ?-Continue statin. ? ? ?Chronic combined systolic and diastolic CHF (congestive heart failure) ([place]) ?Appears euvolemic on exam.  TTE in 09/2020  with LVEF of 40-45%, GH, indeterminate DD, mildly elevated PASP.  On p.o. Lasix 40 mg at home.  Appears euvolemic on exam. ?-Continue home Lasix ?-GDMT-resume home Toprol, losartan and Jardiance ?-Monitor fluid status, renal functions and electrolytes. ?-Sodium and fluid restriction ? ?Uncontrolled hypertension ?Resume home Toprol-XL and losartan ?Continue Cardizem drip ? ?Tobacco use disorder ?Started back smoking 1 month ago, about a pack per day.  ?-Smoking cessation advised.   ?-She declines nicotine patch. ? ?Chronic back pain ?Continue Norco as needed with hold parameters. ? ?Depression with anxiety ?Stable.   ?-Continue home Cymbalta and Xanax. ? ?Hyperlipidemia ?Continue pravastatin. ? ? ? ?DVT prophylaxis:  ? ?apixaban (ELIQUIS) tablet 5 mg  ?Code Status: DNR/DNI ?Family Communication: Patient and/or RN. Available if any question.  ?Level of care: Telemetry ?Status is: Observation ?The patient will require care spanning > 2 midnights and should be moved to inpatient because: COPD exacerbation and A-fib with RVR requiring IV Cardizem and IV Solu-Medrol.  ? ? ?Final disposition: Likely home once medically stable ? ?Consultants:  ?None ? ?Sch Meds:  ?Scheduled Meds: ? apixaban  5 mg Oral BID  ? digoxin  0.125 mg Oral Daily  ? doxycycline  100 mg Oral Q12H  ? DULoxetine  120 mg Oral Daily  ? empagliflozin  10 mg Oral QAC breakfast  ? feeding supplement  237 mL Oral BID BM  ? furosemide  40 mg Oral QODAY  ? insulin aspart  0-5 Units Subcutaneous QHS  ? insulin aspart  0-9 Units Subcutaneous TID WC  ? ipratropium  0.5 mg Nebulization Q6H  ? levalbuterol  0.63 mg Nebulization Q6H  ? losartan  25 mg Oral Daily  ? methylPREDNISolone (SOLU-MEDROL) injection  40 mg Intravenous Q12H  ? metoprolol succinate  75 mg Oral Daily  ? pantoprazole  40 mg Oral QAC breakfast  ? pravastatin  20 mg Oral Daily  ? sodium chloride flush  3 mL Intravenous Q12H  ? ?Continuous Infusions: ? diltiazem (CARDIZEM) infusion 7.5 mg/hr  (10/14/21 0455)  ? ?PRN Meds:.acetaminophen **OR** acetaminophen, ALPRAZolam, guaiFENesin, HYDROcodone-acetaminophen, ipratropium-albuterol, ondansetron **OR** ondansetron (ZOFRAN) IV, senna-docusate ? ?Antimicrobials: ?Anti-infectives (From admission, onward)  ? ? Start     Dose/Rate Route Frequency Ordered Stop  ? 10/14/21 2015  azithromycin (ZITHROMAX) tablet 500 mg  Status:  Discontinued       ?See Hyperspace for full Linked Orders Report.  ? 500 mg Oral Daily 10/13/21 1915 10/13/21 1935  ? 10/13/21 2200  doxycycline (VIBRA-TABS) tablet 100 mg       ? 100 mg Oral Every 12 hours 10/13/21 1935    ? 10/13/21 2000  azithromycin (ZITHROMAX) 500 mg in sodium chloride 0.9 % 250 mL IVPB  Status:  Discontinued       ?See Hyperspace for full Linked Orders Report.  ? 500 mg ?250 mL/hr over 60 Minutes Intravenous Every 24 hours 10/13/21 1915 10/13/21 1935  ? ?  ? ? ? ?I have personally reviewed the following labs and images: ?CBC: ?Recent Labs  ?Lab 10/13/21 ?1257 10/14/21 ?0352  ?WBC 10.8* 8.1  ?HGB 14.9 15.3*  ?HCT 47.1*  48.0*  ?MCV 85.2 86.5  ?PLT 322 300  ? ?BMP &GFR ?Recent Labs  ?Lab 10/13/21 ?1257 10/14/21 ?0352  ?NA 135 135  ?K 3.4* 3.8  ?CL 93* 99  ?CO2 30 24  ?GLUCOSE 153* 200*  ?BUN 14 12  ?CREATININE 0.92 0.76  ?CALCIUM 9.6 9.2  ? ?Estimated Creatinine Clearance: 60.3 mL/min (by C-G formula based on SCr of 0.76 mg/dL). ?Liver & Pancreas: ?Recent Labs  ?Lab 10/13/21 ?1257  ?AST 14*  ?ALT 11  ?ALKPHOS 101  ?BILITOT 0.5  ?PROT 8.0  ?ALBUMIN 4.6  ? ?Recent Labs  ?Lab 10/13/21 ?1257  ?LIPASE 18  ? ?No results for input(s): AMMONIA in the last 168 hours. ?Diabetic: ?Recent Labs  ?  10/14/21 ?0352  ?HGBA1C 7.3*  ? ?Recent Labs  ?Lab 10/14/21 ?0750  ?GLUCAP 185*  ? ?Cardiac Enzymes: ?No results for input(s): CKTOTAL, CKMB, CKMBINDEX, TROPONINI in the last 168 hours. ?No results for input(s): PROBNP in the last 8760 hours. ?Coagulation Profile: ?No results for input(s): INR, PROTIME in the last 168 hours. ?Thyroid  Function Tests: ?No results for input(s): TSH, T4TOTAL, FREET4, T3FREE, THYROIDAB in the last 72 hours. ?Lipid Profile: ?No results for input(s): CHOL, HDL, LDLCALC, TRIG, CHOLHDL, LDLDIRECT in the last 72 hours. ?Anem

## 2021-10-15 DIAGNOSIS — I10 Essential (primary) hypertension: Secondary | ICD-10-CM | POA: Diagnosis not present

## 2021-10-15 DIAGNOSIS — F418 Other specified anxiety disorders: Secondary | ICD-10-CM | POA: Diagnosis not present

## 2021-10-15 DIAGNOSIS — E785 Hyperlipidemia, unspecified: Secondary | ICD-10-CM | POA: Diagnosis not present

## 2021-10-15 DIAGNOSIS — E119 Type 2 diabetes mellitus without complications: Secondary | ICD-10-CM | POA: Diagnosis not present

## 2021-10-15 DIAGNOSIS — F17200 Nicotine dependence, unspecified, uncomplicated: Secondary | ICD-10-CM | POA: Diagnosis not present

## 2021-10-15 DIAGNOSIS — G8929 Other chronic pain: Secondary | ICD-10-CM | POA: Diagnosis not present

## 2021-10-15 DIAGNOSIS — M545 Low back pain, unspecified: Secondary | ICD-10-CM | POA: Diagnosis not present

## 2021-10-15 DIAGNOSIS — I4819 Other persistent atrial fibrillation: Secondary | ICD-10-CM | POA: Diagnosis not present

## 2021-10-15 DIAGNOSIS — I5042 Chronic combined systolic (congestive) and diastolic (congestive) heart failure: Secondary | ICD-10-CM | POA: Diagnosis not present

## 2021-10-15 DIAGNOSIS — J441 Chronic obstructive pulmonary disease with (acute) exacerbation: Secondary | ICD-10-CM | POA: Diagnosis not present

## 2021-10-15 LAB — RENAL FUNCTION PANEL
Albumin: 3.7 g/dL (ref 3.5–5.0)
Anion gap: 8 (ref 5–15)
BUN: 26 mg/dL — ABNORMAL HIGH (ref 8–23)
CO2: 28 mmol/L (ref 22–32)
Calcium: 9.4 mg/dL (ref 8.9–10.3)
Chloride: 102 mmol/L (ref 98–111)
Creatinine, Ser: 0.85 mg/dL (ref 0.44–1.00)
GFR, Estimated: >60 mL/min (ref >60)
Glucose, Bld: 162 mg/dL — ABNORMAL HIGH (ref 70–99)
Phosphorus: 2.9 mg/dL (ref 2.5–4.6)
Potassium: 3.9 mmol/L (ref 3.5–5.1)
Sodium: 138 mmol/L (ref 135–145)

## 2021-10-15 LAB — CBC
HCT: 46.4 % — ABNORMAL HIGH (ref 36.0–46.0)
Hemoglobin: 14.8 g/dL (ref 12.0–15.0)
MCH: 27.6 pg (ref 26.0–34.0)
MCHC: 31.9 g/dL (ref 30.0–36.0)
MCV: 86.6 fL (ref 80.0–100.0)
Platelets: 331 10*3/uL (ref 150–400)
RBC: 5.36 MIL/uL — ABNORMAL HIGH (ref 3.87–5.11)
RDW: 14.8 % (ref 11.5–15.5)
WBC: 19.9 10*3/uL — ABNORMAL HIGH (ref 4.0–10.5)
nRBC: 0 % (ref 0.0–0.2)

## 2021-10-15 LAB — GLUCOSE, CAPILLARY
Glucose-Capillary: 173 mg/dL — ABNORMAL HIGH (ref 70–99)
Glucose-Capillary: 198 mg/dL — ABNORMAL HIGH (ref 70–99)

## 2021-10-15 LAB — TSH: TSH: 0.526 u[IU]/mL (ref 0.350–4.500)

## 2021-10-15 LAB — MAGNESIUM: Magnesium: 2.5 mg/dL — ABNORMAL HIGH (ref 1.7–2.4)

## 2021-10-15 MED ORDER — FUROSEMIDE 40 MG PO TABS: 40.0000 mg | ORAL_TABLET | ORAL | 1 refills | Status: DC

## 2021-10-15 MED ORDER — METFORMIN HCL 500 MG PO TABS: 500.0000 mg | ORAL_TABLET | Freq: Two times a day (BID) | ORAL | 2 refills | Status: AC

## 2021-10-15 MED ORDER — PREDNISONE 50 MG PO TABS: 50.0000 mg | ORAL_TABLET | Freq: Every day | ORAL | 0 refills | Status: AC

## 2021-10-15 MED ORDER — ALBUTEROL SULFATE HFA 108 (90 BASE) MCG/ACT IN AERS: 2.0000 | INHALATION_SPRAY | Freq: Four times a day (QID) | RESPIRATORY_TRACT | 1 refills | Status: DC | PRN

## 2021-10-15 MED ORDER — UMECLIDINIUM-VILANTEROL 62.5-25 MCG/ACT IN AEPB: 1.0000 | INHALATION_SPRAY | Freq: Every day | RESPIRATORY_TRACT | 1 refills | Status: DC

## 2021-10-15 MED ORDER — DOXYCYCLINE MONOHYDRATE 100 MG PO CAPS: 100.0000 mg | ORAL_CAPSULE | Freq: Two times a day (BID) | ORAL | 0 refills | Status: DC

## 2021-10-15 MED ORDER — DILTIAZEM HCL ER COATED BEADS 240 MG PO CP24
240.0000 mg | ORAL_CAPSULE | Freq: Every day | ORAL | Status: DC
  Administered 2021-10-15: 240 mg via ORAL
  Filled 2021-10-15: qty 1

## 2021-10-15 NOTE — Progress Notes (Signed)
PT Cancellation Note ? ?Patient Details ?Name: Theresa Mendoza ?MRN: [id number] ?DOB: 1946/06/29 ? ? ?Cancelled Treatment:     PT order received . See pt has been in hall ambulating with mobility specialist and nursing with no assistive device and no deficits. Also seen by OT yesterday with no deficits.  ? ?Spoke with pt at bedside, she does not feel a need for PT at this time.also has a really bad headache and politely declining out of bed at this time.  ? ?No PT needs at this time to sign of.  ? ? Marella Bile ?10/15/2021, 10:16 AM ?Clois Dupes, PT, MPT ?[facility] Services ?Office: [phone] ?Pager: [phone] ?10/15/2021 ? ?

## 2021-10-15 NOTE — Discharge Summary (Signed)
? ?Physician Discharge Summary  ?Theresa Mendoza ZOX:[id number] DOB: 1946/04/17 DOA: 10/13/2021 ? ?PCP: [name], MD ? ?Admit date: 10/13/2021 ?Discharge date: 10/15/2021 ?Admitted From: Home ?Disposition: Home ?Recommendations for Outpatient Follow-up:  ?Follow ups as below. ?Please obtain CBC/BMP/Mag at follow up ?Encouraged smoking cessation and compliance with medication ?Please follow up on the following pending results: None ? ?Home Health: Not indicated ?Equipment/Devices: None ? ?Discharge Condition: Stable ?CODE STATUS: DNR/DNI ? Follow-up Information   ? ? Koirala, Dibas, MD. Schedule an appointment as soon as possible for a visit in 1 week(s).   ?Specialty: Family Medicine ?Contact information: ?[address] ?[address] ?[place] [address] ?[phone] ? ? ?  ?  ? ? Quintella Reichert, MD Follow up in 2 week(s).   ?Specialty: Cardiology ?Contact information: ?[address] ?[address] ?[place] [address] ?[phone] ? ? ?  ?  ? ?  ?  ? ?  ? ? ?Hospital course ?76 year old F with PMH of COPD and chronic RF on oxygen but not using, combined CHF (EF 40-45%), PAH, persistent A-fib on Eliquis, chronic back pain, tobacco use disorder, anxiety and depression presenting with progressive shortness of breath and productive cough, and admitted for COPD exacerbation and A-fib with RVR.  Patient is only on albuterol that she has not been using lately.  She also restarted smoking cigarettes about a pack a day lately due to some stressors at home. ?Started on systemic steroid, antibiotics and nebulizers for COPD exacerbation.  Started on Cardizem drip for A-fib with RVR. ? ?Patient's respiratory symptoms improved.  RVR resolved.  She was transitioned off IV Cardizem to home p.o. Cardizem CD.  Ambulated on room air and maintained appropriate saturation.  She is discharged on p.o. prednisone and doxycycline for 3 more days to complete course.  Started on [employer].  Counseled on smoking cessation.    ? ?See individual problem list below for more on hospital course. ? ?Problems addressed during this hospitalization ?Problem  ?Acute Exacerbation of Chronic Obstructive Pulmonary Disease (Copd) ([facility])  ?Persistent Atrial Fibrillation With Rapid Ventricular Response ([facility])  ?Diabetes Mellitus Type II, Non Insulin Dependent ([facility])  ?Chronic Combined Systolic and Diastolic Chf (Congestive Heart Failure) ([facility])  ?Uncontrolled Hypertension  ?Tobacco Use Disorder  ?Depression With Anxiety  ?  ?Assessment and Plan: ?* Acute exacerbation of chronic obstructive pulmonary disease (COPD) ([facility]) ?Likely due to ongoing cigarette smoking.  She is only on albuterol that she has not been using.  Respiratory symptoms improved.  Ambulated on room air and maintained appropriate saturation. ?-Discharged on p.o. prednisone and doxycycline for 3 more days ?-Started on Anoro Ellipta.  Renewed home Rx for albuterol ?-Counseled on smoking cessation but not ready. ? ?Persistent atrial fibrillation with rapid ventricular response ([facility]) ?Likely from COPD exacerbation.  Also suspect noncompliance.  RVR resolved. Discharged on home digoxin, Cardizem CD, Toprol-XL and Eliquis.  Counseled on the importance of compliance with meds. ? ?Diabetes mellitus type II, non insulin dependent ([facility]) ?Hemoglobin A1c 7.3%.  She is on Jardiance at home likely for CHF.  Hyperglycemia likely due to steroid. ?Recent Labs  ?Lab 10/14/21 ?1146 10/14/21 ?1713 10/14/21 ?2052 10/15/21 ?4132 10/15/21 ?1131  ?GLUCAP 183* 158* 209* 173* 198*  ?-Continue home Jardiance.  Added metformin. ?-Hold home Lasix until she completes prednisone course. ? ? ?Chronic combined systolic and diastolic CHF (congestive heart failure) ([facility]) ?Appears euvolemic on exam.  TTE in 09/2020 with LVEF of 40-45%, GH, indeterminate DD, mildly elevated PASP.  On  p.o. Lasix 40 mg at home.  Appears euvolemic on exam. ?-Advised to hold Lasix for 3 days until she completes steroid course. ?-Continue  GDMT. ? ?Uncontrolled hypertension ?Improved.  Continue home Toprol-XL, Cardizem and losartan.  Resume Lasix after finishing prednisone course ? ?Tobacco use disorder ?Started back smoking 1 month ago, about a pack per day.  Counseled on the importance of smoking cessation but she does not seem ready. ? ?Chronic back pain ?Continue Norco as needed with hold parameters. ? ?Depression with anxiety ?Stable.   ?-Continue home Cymbalta and Xanax. ? ?Hyperlipidemia ?Continue pravastatin. ? ? ? ? ?Vital signs ?Vitals:  ? 10/15/21 [id number] 10/15/21 1308 10/15/21 1447 10/15/21 1456  ?BP: (!) 154/70 (!) 152/95    ?Pulse: 85 76 78   ?Temp:  98.6 ?F (37 ?C)    ?Resp: (!) 24 20    ?Height:      ?Weight:      ?SpO2: 95% 95% 95% 93%  ?TempSrc:  Oral    ?BMI (Calculated):      ?  ? ?Discharge exam ? ?GENERAL: No apparent distress.  Nontoxic. ?HEENT: MMM.  Vision and hearing grossly intact.  ?NECK: Supple.  No apparent JVD.  ?RESP:  No IWOB.  Fair aeration bilaterally. ?CVS: Irregular rhythm.  Normal rate.  Heart sounds normal.  ?ABD/GI/GU: BS+. Abd soft, NTND.  ?MSK/EXT:  Moves extremities. No apparent deformity. No edema.  ?SKIN: no apparent skin lesion or wound ?NEURO: Awake and alert. Oriented appropriately.  No apparent focal neuro deficit. ?PSYCH: Calm. Normal affect.  ? ?Discharge Instructions ?Discharge Instructions   ? ? Call MD for:  difficulty breathing, headache or visual disturbances   Complete by: As directed ?  ? Call MD for:  extreme fatigue   Complete by: As directed ?  ? Call MD for:  persistant dizziness or light-headedness   Complete by: As directed ?  ? Diet - low sodium heart healthy   Complete by: As directed ?  ? Diet Carb Modified   Complete by: As directed ?  ? Discharge instructions   Complete by: As directed ?  ? It has been a pleasure taking care of you! ? ?You were hospitalized due to COPD exacerbation and atrial fibrillation for which you have been treated with medications.  Your symptoms improved to the  point we think it is safe to let you go home and follow-up with your primary care doctor.  We are discharging you on more steroid and antibiotic to complete treatment course.  We have also started you on new inhaler that you need to use daily regardless of how you feel.  You may use your albuterol inhaler only as needed for shortness of breath, wheezing or cough.  We have also started you on metformin in addition to Jardiance for diabetes, which we just found out.  We recommend holding your furosemide (Lasix) for 3 days until you complete prednisone.  It is very important that you take your medications as prescribed.  We strongly recommend you quit smoking cigarettes.  Please follow-up with your primary care doctor in 1 to 2 weeks or sooner if needed ? ?It is important that you quit smoking cigarettes.  You may use nicotine patch to help you quit smoking.  Nicotine patch is available over-the-counter.  You may also discuss other options to help you quit smoking with your primary care doctor. You can also talk to professional counselors at 1-800-QUIT-NOW ([phone])267-7912(1-815 [phone]) for free smoking cessation counseling. ? ? ? ? ?  Take care,  ? Increase activity slowly   Complete by: As directed ?  ? ?  ? ?Allergies as of 10/15/2021   ? ?   Reactions  ? Lithium   ? Patient became suicidal  ? Tegretol [carbamazepine]   ? UNK reaction  ? ?  ? ?  ?Medication List  ?  ? ?TAKE these medications   ? ?albuterol 108 (90 Base) MCG/ACT inhaler ?Commonly known as: VENTOLIN HFA ?Inhale 2 puffs into the lungs every 6 (six) hours as needed for wheezing or shortness of breath. ?  ?ALPRAZolam 0.25 MG tablet ?Commonly known as: Prudy Feeler ?Take 0.25 mg by mouth daily as needed. ?  ?digoxin 0.125 MG tablet ?Commonly known as: LANOXIN ?Take 1 tablet (0.125 mg total) by mouth daily. ?  ?diltiazem 240 MG 24 hr capsule ?Commonly known as: CARDIZEM CD ?Take 240 mg by mouth daily. ?  ?doxycycline 100 MG capsule ?Commonly known as: MONODOX ?Take 1 capsule (100  mg total) by mouth 2 (two) times daily. ?  ?DULoxetine 60 MG capsule ?Commonly known as: CYMBALTA ?TAKE 2 CAPSULES BY MOUTH EVERY DAY ?  ?Eliquis 5 MG Tabs tablet ?Generic drug: apixaban ?TAKE 1 TABLET BY MOUT

## 2021-10-15 NOTE — Plan of Care (Signed)

## 2021-10-15 NOTE — Progress Notes (Signed)
SATURATION QUALIFICATIONS: (This note is used to comply with regulatory documentation for home oxygen) ? ?Patient Saturations on Room Air at Rest = 95% ? ?Patient Saturations on Room Air while Ambulating = 89% ? ?Patient Saturations on 1 Liters of oxygen while Ambulating = 95% ? ?Please briefly explain why patient needs home oxygen: ?

## 2021-10-18 LAB — CULTURE, BLOOD (ROUTINE X 2)
Culture: NO GROWTH
Special Requests: ADEQUATE

## 2021-10-19 LAB — CULTURE, BLOOD (ROUTINE X 2): Special Requests: ADEQUATE

## 2021-10-21 ENCOUNTER — Other Ambulatory Visit: Payer: Self-pay | Admitting: Cardiology

## 2021-10-26 DIAGNOSIS — Z720 Tobacco use: Secondary | ICD-10-CM | POA: Diagnosis not present

## 2021-10-26 DIAGNOSIS — R600 Localized edema: Secondary | ICD-10-CM | POA: Diagnosis not present

## 2021-10-26 DIAGNOSIS — I5042 Chronic combined systolic (congestive) and diastolic (congestive) heart failure: Secondary | ICD-10-CM | POA: Diagnosis not present

## 2021-10-26 DIAGNOSIS — E1165 Type 2 diabetes mellitus with hyperglycemia: Secondary | ICD-10-CM | POA: Diagnosis not present

## 2021-10-26 DIAGNOSIS — I480 Paroxysmal atrial fibrillation: Secondary | ICD-10-CM | POA: Diagnosis not present

## 2021-10-26 DIAGNOSIS — J9611 Chronic respiratory failure with hypoxia: Secondary | ICD-10-CM | POA: Diagnosis not present

## 2021-11-06 ENCOUNTER — Other Ambulatory Visit: Payer: Self-pay | Admitting: Cardiology

## 2021-11-10 ENCOUNTER — Ambulatory Visit: Payer: No Typology Code available for payment source | Admitting: Nurse Practitioner

## 2021-11-10 ENCOUNTER — Encounter: Payer: Self-pay | Admitting: Physician Assistant

## 2021-11-10 VITALS — BP 120/72 | HR 76 | Ht 64.0 in | Wt 166.0 lb

## 2021-11-10 DIAGNOSIS — F17200 Nicotine dependence, unspecified, uncomplicated: Secondary | ICD-10-CM | POA: Diagnosis not present

## 2021-11-10 DIAGNOSIS — I340 Nonrheumatic mitral (valve) insufficiency: Secondary | ICD-10-CM | POA: Diagnosis not present

## 2021-11-10 DIAGNOSIS — I4821 Permanent atrial fibrillation: Secondary | ICD-10-CM

## 2021-11-10 DIAGNOSIS — I252 Old myocardial infarction: Secondary | ICD-10-CM

## 2021-11-10 DIAGNOSIS — I5042 Chronic combined systolic (congestive) and diastolic (congestive) heart failure: Secondary | ICD-10-CM

## 2021-11-10 DIAGNOSIS — I350 Nonrheumatic aortic (valve) stenosis: Secondary | ICD-10-CM

## 2021-11-10 NOTE — Patient Instructions (Addendum)
Medication Instructions:  ?INCREASE Lasix to once a day if you gain more than 3 lbs in a day or 5 lbs in a week, the go back to regular dose ?*If you need a refill on your cardiac medications before your next appointment, please call your pharmacy* ? ? ?Lab Work: ?Your physician recommends that you return for lab work in: DIG LEVEL- SAME DAY AS ECHO (COMPLETE BEFORE ECHO) ?If you have labs (blood work) drawn today and your tests are completely normal, you will receive your results only by: ?MyChart Message (if you have MyChart) OR ?A paper copy in the mail ?If you have any lab test that is abnormal or we need to change your treatment, we will call you to review the results. ? ? ?Testing/Procedures: ?Your physician has requested that you have an echocardiogram. Echocardiography is a painless test that uses sound waves to create images of your heart. It provides your doctor with information about the size and shape of your heart and how well your heart?s chambers and valves are working. This procedure takes approximately one hour. There are no restrictions for this procedure. ?(COMPLETE LAB RIGHT BEFORE THIS TEST) ? ?Follow-Up: ?At [facility], you and your health needs are our priority.  As part of our continuing mission to provide you with exceptional heart care, we have created designated Provider Care Teams.  These Care Teams include your primary Cardiologist (physician) and Advanced Practice Providers (APPs -  Physician Assistants and Nurse Practitioners) who all work together to provide you with the care you need, when you need it. ? ?We recommend signing up for the patient portal called "MyChart".  Sign up information is provided on this After Visit Summary.  MyChart is used to connect with patients for Virtual Visits (Telemedicine).  Patients are able to view lab/test results, encounter notes, upcoming appointments, etc.  Non-urgent messages can be sent to your provider as well.   ?To learn more about what  you can do with MyChart, go to [email or web address].   ? ?Your next appointment:   ?6 week(s) ? ?The format for your next appointment:   ?In Person ? ?Provider:   ?Armanda Magic, MD or APP  ? ? ?Other Instructions ?You have been referred to PULMONARY ? ?Important Information About Sugar ? ? ? ? ?  ?

## 2021-11-10 NOTE — Progress Notes (Signed)
? ? ?Office Visit  ?  ?Patient Name: Theresa Mendoza ?Date of Encounter: 11/10/2021 ? ?Primary Care Provider:  Lujean Amel, MD ?Primary Cardiologist:  Fransico Him, MD ?Primary Electrophysiologist: None ? ?Patient Profile  ?  ?Chief Complaint: Hospital Follow up  ? ? Notable History: ?Remote history of MI, details unclear, no CAD by cath 2016 ?Permanent Atrial fibrillation currently on Eliquis 5 mg ?COPD ?HLD ?Mitral regurgitation, mild ?RBBB ?Tobacco abuse ?Pulmonary hypertension ?Mild-moderate aortic stenosis ?Chronic combined heart failure ? ? Recent Studies: ?Lexiscan stress test 01/2016: medium defect of moderate severity basal inferior, mid inferior and apical inferior regions. No ischemia noted. ?2D echo 09/2020: EF 40-45%, with mild decreased LV function, with global hypokinesis and no LVH, moderately dilated LA mild to moderate aortic valve stenosis ? ? ?History of Present Illness  ?  ?Theresa Mendoza is a 76 y.o. female with PMH of tobacco use, remote MI, permanent AF currently on Eliquis, COPD, HLD, bipolar disorder, MR/AS, chronic combined heart failure, pulmonary hypertension.  She was first seen by Dr. Radford Pax in 12/2015 after relocating to [facility] from [place]. There was report of possible prior MI though when records were obtained, cath in 2016 showed no CAD. She was continuing to smoke with her COPD and had decreased to 1 PPD down from 3 PPD.  Echo completed 01/2016 with EF 60-65% with no R WMA, normal LA size with trivial MR.  Nuclear stress test 2017 showed no ischemia. She was maintained on rate control with combination of digoxin, atenolol, and Cardizem.  ? ?She was lost to follow up during the pandemic and admitted to the hospital in  March 2022 for CHF and A-fib with RVR. Echo performed which revealed decline in EF of 40-45% with global hypokinesis and moderately dilated LA, mild to moderate AS. Patient ran out of digoxin and was treated with rate control and diuresis. ? ?She was  last seen by Dr. Radford Pax on 11/10/2020 and reported doing well and was compliant with all medications.  She was admitted 09/2021 for acute COPD exacerbation and A-fib with RVR.  Noncompliance was suspected (O2, inhalers, meds).  ? ?Since being discharged from the hospital on 10/15/2021 Theresa Mendoza reports that she is doing well.  She is accompanied today by her son in office for her follow-up.  Her son reports that she has stopped smoking since her last ED visit and that she is tolerating her inhalers and home O2 well.  She states that her appetite has increased with the lack of cigarettes and she has gained a few pounds since her last appointment.  She is euvolemic on examination.  Her son does report that his mom sometimes misses her morning and evening medications due to her ongoing memory loss and reluctance to take medications. Patient denies chest pain, palpitations, dyspnea, PND, orthopnea, nausea, vomiting, dizziness, syncope, edema, weight gain, or early satiety. She is hesitant to weigh on a regular basis due to being made to step on the scale as a child after eating. ? ?Past Medical History  ?  ?Past Medical History:  ?Diagnosis Date  ? Anxiety   ? Bipolar 1 disorder ([place])   ? Bowel obstruction ([place])   ? Cataract   ? Chronic atrial fibrillation ([place]) 09/2014  ? COPD (chronic obstructive pulmonary disease) ([place])   ? Dyslipidemia, goal LDL below 70 12/25/2015  ? Encounter for tobacco use cessation counseling 12/04/2017  ? Glaucoma   ? Hx of migraine headaches   ? Memory loss   ?  Mitral regurgitation   ? mild to moderate on echo 09/2014  ? Old MI (myocardial infarction) 09/2014  ? cath with normal coronary arteries and EF 50-55%  ? Osteoarthritis   ? Pulmonary HTN ([place]) 09/2014  ? Mild with PASP 7mmhg at cath  ? RBBB   ? Rheumatoid arthritis ([place])   ? Tobacco abuse   ? ?Past Surgical History:  ?Procedure Laterality Date  ? ADENOIDECTOMY    ? BACK SURGERY    ? bowel obstruction    ? CARDIAC CATHETERIZATION   09/2014  ? normal coronary arteries with mild pulmonary HTN, low normal LVF  ? CHOLECYSTECTOMY    ? NOSE SURGERY    ? TONSILLECTOMY    ? TRANSESOPHAGEAL ECHOCARDIOGRAM WITH CARDIOVERSION  09/2014  ? VAGINAL HYSTERECTOMY    ? with BSO  ? ? ?Allergies ? ?Allergies  ?Allergen Reactions  ? Lithium   ?  Patient became suicidal  ? Tegretol [Carbamazepine]   ?  UNK reaction  ? ? ?Home Medications  ?  ?Current Outpatient Medications  ?Medication Sig Dispense Refill  ? albuterol (VENTOLIN HFA) 108 (90 Base) MCG/ACT inhaler Inhale 2 puffs into the lungs every 6 (six) hours as needed for wheezing or shortness of breath. 1 each 1  ? ALPRAZolam (XANAX) 0.25 MG tablet Take 0.25 mg by mouth daily as needed.    ? digoxin (LANOXIN) 0.125 MG tablet Take 1 tablet (0.125 mg total) by mouth daily. 90 tablet 3  ? diltiazem (CARDIZEM CD) 240 MG 24 hr capsule Take 240 mg by mouth daily.  1  ? doxycycline (MONODOX) 100 MG capsule Take 1 capsule (100 mg total) by mouth 2 (two) times daily. 8 capsule 0  ? DULoxetine (CYMBALTA) 60 MG capsule TAKE 2 CAPSULES BY MOUTH EVERY DAY (Patient taking differently: Take 120 mg by mouth daily.) 180 capsule 2  ? ELIQUIS 5 MG TABS tablet TAKE 1 TABLET BY MOUTH TWICE A DAY 60 tablet 5  ? empagliflozin (JARDIANCE) 10 MG TABS tablet Take 1 tablet (10 mg total) by mouth daily before breakfast. 30 tablet 11  ? furosemide (LASIX) 40 MG tablet Take 1 tablet (40 mg total) by mouth every other day. 30 tablet 1  ? HYDROcodone-acetaminophen (NORCO) 10-325 MG tablet Take 1 tablet by mouth 5 (five) times daily as needed for moderate pain or severe pain.    ? losartan (COZAAR) 25 MG tablet TAKE 1 TABLET (25 MG TOTAL) BY MOUTH DAILY. 90 tablet 0  ? metFORMIN (GLUCOPHAGE) 500 MG tablet Take 1 tablet (500 mg total) by mouth 2 (two) times daily with a meal. 60 tablet 2  ? metoprolol succinate (TOPROL-XL) 25 MG 24 hr tablet Take 3 tablets (75 mg total) by mouth daily. Please keep upcoming appt for future refills. 90 tablet 0  ?  omeprazole (PRILOSEC) 40 MG capsule Take 40 mg by mouth in the morning. Before breakfast    ? pravastatin (PRAVACHOL) 40 MG tablet Take 1 tablet by mouth daily.    ? umeclidinium-vilanterol (ANORO ELLIPTA) 62.5-25 MCG/ACT AEPB Inhale 1 puff into the lungs daily. 1 each 1  ? ?No current facility-administered medications for this visit.  ?  ? ?Review of Systems  ?Please see the history of present illness.    ? ?All other systems reviewed and are otherwise negative except as noted above. ? ?Physical Exam  ?  ?Wt Readings from Last 3 Encounters:  ?11/10/21 166 lb (75.3 kg)  ?10/15/21 162 lb 14.7 oz (73.9 kg)  ?11/10/20 162  lb (73.5 kg)  ? ?VS: ?Vitals:  ? 11/10/21 1504  ?BP: 120/72  ?Pulse: 76  ?SpO2: 95%  ?,Body mass index is 28.49 kg/m?. ? ?Constitutional:   ?   Appearance: Healthy appearance. Not in distress.  ?Neck:  ?   Vascular: JVD normal.  ?Pulmonary:  ?   Effort: Pulmonary effort is normal.  ?   Breath sounds: Slight expiratory wheezing. No rales. Diminished in the bases ?Cardiovascular:  ?   Normal rate.  Irregularly irregular. Normal S1. Normal S2.   ?   Murmurs: There is no murmur.  ?Edema: ?   Peripheral edema absent.  ?Abdominal:  ?   Palpations: Abdomen is soft non tender. There is no hepatomegaly.  ?Skin: ?   General: Skin is warm and dry.  ?Neurological:  ?   General: No focal deficit present.  ?   Mental Status: Alert and oriented to person, place and time.  ?   Cranial Nerves: Cranial nerves are intact.  ?EKG/LABS/Other Studies Reviewed  ?  ?ECG personally reviewed by me today -none ordered for this visit, reviewed last EKG from ED visit, AFib RVR with RBBB. ? ?Risk Assessment/Calculations:   ? ?CHA2DS2-VASc Score = 5  ? This indicates a 7.2% annual risk of stroke. ?The patient's score is based upon: ?CHF History: 1 ?HTN History: 1 ?Diabetes History: 0 ?Stroke History: 0 ?Vascular Disease History: 1 ?Age Score: 1 ?Gender Score: 1 ?  ? ? ?Lab Results  ?Component Value Date  ? WBC 19.9 (H) 10/15/2021  ?  HGB 14.8 10/15/2021  ? HCT 46.4 (H) 10/15/2021  ? MCV 86.6 10/15/2021  ? PLT 331 10/15/2021  ? ?Lab Results  ?Component Value Date  ? CREATININE 0.85 10/15/2021  ? BUN 26 (H) 10/15/2021  ? NA 138 04/01/

## 2021-11-21 ENCOUNTER — Ambulatory Visit: Payer: No Typology Code available for payment source | Admitting: Physician Assistant

## 2021-11-22 ENCOUNTER — Other Ambulatory Visit: Payer: Self-pay | Admitting: Cardiology

## 2021-11-23 ENCOUNTER — Encounter (HOSPITAL_COMMUNITY): Payer: Self-pay | Admitting: Radiology

## 2021-11-25 ENCOUNTER — Encounter (HOSPITAL_COMMUNITY): Payer: Self-pay | Admitting: Nurse Practitioner

## 2021-11-29 DIAGNOSIS — M47816 Spondylosis without myelopathy or radiculopathy, lumbar region: Secondary | ICD-10-CM | POA: Diagnosis not present

## 2021-12-05 ENCOUNTER — Other Ambulatory Visit: Payer: Self-pay | Admitting: *Deleted

## 2021-12-05 MED ORDER — DIGOXIN 125 MCG PO TABS: 125.0000 ug | ORAL_TABLET | Freq: Every day | ORAL | 0 refills | Status: DC

## 2021-12-07 ENCOUNTER — Other Ambulatory Visit: Payer: Self-pay | Admitting: Cardiology

## 2021-12-07 NOTE — Telephone Encounter (Signed)
Pt last saw Robin SearingErnest Dick, NP on 11/10/21, last labs 10/14/21 Creat 0.76, age 76, weight 75.3kg, based on specified criteria pt is on appropriate dosage of Eliquis 5mg  BID for afib.  Will refill rx.

## 2021-12-09 ENCOUNTER — Encounter (HOSPITAL_COMMUNITY): Payer: Self-pay | Admitting: Radiology

## 2021-12-09 NOTE — Progress Notes (Unsigned)
Just an FYI. We have made several attempts to contact this patient including sending a letter to schedule or reschedule their echocardiogram. We will be removing the patient from the echo WQ.   Thank you  MAILED LETTER [facility]  11/22/21 [facility] x 3 @ 10:28/LBW  11/15/21 [facility] to schedule @ 10:17/LBW  11/11/21 [facility] to schedule @ 11:40/LBW

## 2021-12-10 ENCOUNTER — Other Ambulatory Visit: Payer: Self-pay | Admitting: Nurse Practitioner

## 2021-12-15 ENCOUNTER — Encounter: Payer: Self-pay | Admitting: Cardiology

## 2022-04-10 ENCOUNTER — Other Ambulatory Visit: Payer: Self-pay | Admitting: Cardiology

## 2022-04-13 ENCOUNTER — Other Ambulatory Visit: Payer: Self-pay | Admitting: Nurse Practitioner

## 2022-05-24 DIAGNOSIS — F1120 Opioid dependence, uncomplicated: Secondary | ICD-10-CM | POA: Diagnosis not present

## 2022-05-24 DIAGNOSIS — M47816 Spondylosis without myelopathy or radiculopathy, lumbar region: Secondary | ICD-10-CM | POA: Diagnosis not present

## 2022-06-17 ENCOUNTER — Other Ambulatory Visit: Payer: Self-pay | Admitting: Cardiology

## 2022-06-17 DIAGNOSIS — I4819 Other persistent atrial fibrillation: Secondary | ICD-10-CM

## 2022-06-19 NOTE — Telephone Encounter (Signed)
Prescription refill request for Eliquis received. Indication: Afib  Last office visit: 11/10/21 Louanne Skye)  Scr: 0.85 (10/15/21)  Age: 76 Weight: 75.3kg  Appropriate dose and refill sent to requested pharmacy.

## 2022-07-24 DIAGNOSIS — G894 Chronic pain syndrome: Secondary | ICD-10-CM | POA: Diagnosis not present

## 2022-07-24 DIAGNOSIS — I4821 Permanent atrial fibrillation: Secondary | ICD-10-CM | POA: Diagnosis not present

## 2022-07-24 DIAGNOSIS — E039 Hypothyroidism, unspecified: Secondary | ICD-10-CM | POA: Diagnosis not present

## 2022-07-24 DIAGNOSIS — E1165 Type 2 diabetes mellitus with hyperglycemia: Secondary | ICD-10-CM | POA: Diagnosis not present

## 2022-07-24 DIAGNOSIS — R42 Dizziness and giddiness: Secondary | ICD-10-CM | POA: Diagnosis not present

## 2022-07-24 DIAGNOSIS — I5042 Chronic combined systolic (congestive) and diastolic (congestive) heart failure: Secondary | ICD-10-CM | POA: Diagnosis not present

## 2022-07-24 DIAGNOSIS — F1120 Opioid dependence, uncomplicated: Secondary | ICD-10-CM | POA: Diagnosis not present

## 2022-07-24 DIAGNOSIS — E782 Mixed hyperlipidemia: Secondary | ICD-10-CM | POA: Diagnosis not present

## 2022-07-24 DIAGNOSIS — I2510 Atherosclerotic heart disease of native coronary artery without angina pectoris: Secondary | ICD-10-CM | POA: Diagnosis not present

## 2022-07-24 DIAGNOSIS — N390 Urinary tract infection, site not specified: Secondary | ICD-10-CM | POA: Diagnosis not present

## 2022-07-24 DIAGNOSIS — M47816 Spondylosis without myelopathy or radiculopathy, lumbar region: Secondary | ICD-10-CM | POA: Diagnosis not present

## 2022-07-24 DIAGNOSIS — B9620 Unspecified Escherichia coli [E. coli] as the cause of diseases classified elsewhere: Secondary | ICD-10-CM | POA: Diagnosis not present

## 2022-08-03 DIAGNOSIS — E782 Mixed hyperlipidemia: Secondary | ICD-10-CM | POA: Diagnosis not present

## 2022-08-03 DIAGNOSIS — I4821 Permanent atrial fibrillation: Secondary | ICD-10-CM | POA: Diagnosis not present

## 2022-08-03 DIAGNOSIS — E039 Hypothyroidism, unspecified: Secondary | ICD-10-CM | POA: Diagnosis not present

## 2022-08-03 DIAGNOSIS — I5042 Chronic combined systolic (congestive) and diastolic (congestive) heart failure: Secondary | ICD-10-CM | POA: Diagnosis not present

## 2022-08-03 DIAGNOSIS — E1165 Type 2 diabetes mellitus with hyperglycemia: Secondary | ICD-10-CM | POA: Diagnosis not present

## 2022-08-03 DIAGNOSIS — J449 Chronic obstructive pulmonary disease, unspecified: Secondary | ICD-10-CM | POA: Diagnosis not present

## 2022-08-03 DIAGNOSIS — F3132 Bipolar disorder, current episode depressed, moderate: Secondary | ICD-10-CM | POA: Diagnosis not present

## 2022-08-03 DIAGNOSIS — K219 Gastro-esophageal reflux disease without esophagitis: Secondary | ICD-10-CM | POA: Diagnosis not present

## 2022-08-23 DIAGNOSIS — Z1330 Encounter for screening examination for mental health and behavioral disorders, unspecified: Secondary | ICD-10-CM | POA: Diagnosis not present

## 2022-08-23 DIAGNOSIS — F1120 Opioid dependence, uncomplicated: Secondary | ICD-10-CM | POA: Diagnosis not present

## 2022-08-23 DIAGNOSIS — M47816 Spondylosis without myelopathy or radiculopathy, lumbar region: Secondary | ICD-10-CM | POA: Diagnosis not present

## 2022-11-13 ENCOUNTER — Other Ambulatory Visit: Payer: Self-pay | Admitting: Cardiology

## 2022-12-12 DIAGNOSIS — F1120 Opioid dependence, uncomplicated: Secondary | ICD-10-CM | POA: Diagnosis not present

## 2022-12-12 DIAGNOSIS — M47816 Spondylosis without myelopathy or radiculopathy, lumbar region: Secondary | ICD-10-CM | POA: Diagnosis not present

## 2022-12-13 ENCOUNTER — Other Ambulatory Visit: Payer: Self-pay | Admitting: Cardiology

## 2022-12-21 ENCOUNTER — Other Ambulatory Visit: Payer: Self-pay | Admitting: Cardiology

## 2023-01-18 ENCOUNTER — Other Ambulatory Visit: Payer: Self-pay | Admitting: Cardiology

## 2023-01-18 DIAGNOSIS — I4819 Other persistent atrial fibrillation: Secondary | ICD-10-CM

## 2023-01-19 NOTE — Telephone Encounter (Signed)
Prescription refill request for Eliquis received. Indication:afib Last office visit:needs appt Scr:1.33  1/24 Age: 77 Weight:75.3  kg  Prescription refilled

## 2023-01-26 ENCOUNTER — Other Ambulatory Visit: Payer: Self-pay | Admitting: Cardiology

## 2023-01-31 DIAGNOSIS — N1831 Chronic kidney disease, stage 3a: Secondary | ICD-10-CM | POA: Diagnosis not present

## 2023-01-31 DIAGNOSIS — I4821 Permanent atrial fibrillation: Secondary | ICD-10-CM | POA: Diagnosis not present

## 2023-01-31 DIAGNOSIS — F0390 Unspecified dementia without behavioral disturbance: Secondary | ICD-10-CM | POA: Diagnosis not present

## 2023-01-31 DIAGNOSIS — R0789 Other chest pain: Secondary | ICD-10-CM | POA: Diagnosis not present

## 2023-01-31 DIAGNOSIS — E039 Hypothyroidism, unspecified: Secondary | ICD-10-CM | POA: Diagnosis not present

## 2023-01-31 DIAGNOSIS — G894 Chronic pain syndrome: Secondary | ICD-10-CM | POA: Diagnosis not present

## 2023-01-31 DIAGNOSIS — I5042 Chronic combined systolic (congestive) and diastolic (congestive) heart failure: Secondary | ICD-10-CM | POA: Diagnosis not present

## 2023-01-31 DIAGNOSIS — J449 Chronic obstructive pulmonary disease, unspecified: Secondary | ICD-10-CM | POA: Diagnosis not present

## 2023-01-31 DIAGNOSIS — H6123 Impacted cerumen, bilateral: Secondary | ICD-10-CM | POA: Diagnosis not present

## 2023-01-31 DIAGNOSIS — E782 Mixed hyperlipidemia: Secondary | ICD-10-CM | POA: Diagnosis not present

## 2023-01-31 DIAGNOSIS — Z0000 Encounter for general adult medical examination without abnormal findings: Secondary | ICD-10-CM | POA: Diagnosis not present

## 2023-01-31 DIAGNOSIS — E1165 Type 2 diabetes mellitus with hyperglycemia: Secondary | ICD-10-CM | POA: Diagnosis not present

## 2023-02-14 DIAGNOSIS — F332 Major depressive disorder, recurrent severe without psychotic features: Secondary | ICD-10-CM | POA: Diagnosis not present

## 2023-02-14 DIAGNOSIS — H9193 Unspecified hearing loss, bilateral: Secondary | ICD-10-CM | POA: Diagnosis not present

## 2023-02-14 DIAGNOSIS — H6123 Impacted cerumen, bilateral: Secondary | ICD-10-CM | POA: Diagnosis not present

## 2023-02-14 DIAGNOSIS — E782 Mixed hyperlipidemia: Secondary | ICD-10-CM | POA: Diagnosis not present

## 2023-03-14 DIAGNOSIS — M792 Neuralgia and neuritis, unspecified: Secondary | ICD-10-CM | POA: Diagnosis not present

## 2023-03-14 DIAGNOSIS — M47816 Spondylosis without myelopathy or radiculopathy, lumbar region: Secondary | ICD-10-CM | POA: Diagnosis not present

## 2023-03-14 DIAGNOSIS — F1120 Opioid dependence, uncomplicated: Secondary | ICD-10-CM | POA: Diagnosis not present

## 2023-06-01 DIAGNOSIS — I129 Hypertensive chronic kidney disease with stage 1 through stage 4 chronic kidney disease, or unspecified chronic kidney disease: Secondary | ICD-10-CM | POA: Diagnosis not present

## 2023-06-01 DIAGNOSIS — F17211 Nicotine dependence, cigarettes, in remission: Secondary | ICD-10-CM | POA: Diagnosis not present

## 2023-06-01 DIAGNOSIS — Z7901 Long term (current) use of anticoagulants: Secondary | ICD-10-CM | POA: Diagnosis not present

## 2023-06-01 DIAGNOSIS — E785 Hyperlipidemia, unspecified: Secondary | ICD-10-CM | POA: Diagnosis not present

## 2023-06-01 DIAGNOSIS — I4891 Unspecified atrial fibrillation: Secondary | ICD-10-CM | POA: Diagnosis not present

## 2023-06-01 DIAGNOSIS — Z008 Encounter for other general examination: Secondary | ICD-10-CM | POA: Diagnosis not present

## 2023-06-01 DIAGNOSIS — I5032 Chronic diastolic (congestive) heart failure: Secondary | ICD-10-CM | POA: Diagnosis not present

## 2023-06-01 DIAGNOSIS — R2681 Unsteadiness on feet: Secondary | ICD-10-CM | POA: Diagnosis not present

## 2023-06-01 DIAGNOSIS — M1990 Unspecified osteoarthritis, unspecified site: Secondary | ICD-10-CM | POA: Diagnosis not present

## 2023-06-01 DIAGNOSIS — N1831 Chronic kidney disease, stage 3a: Secondary | ICD-10-CM | POA: Diagnosis not present

## 2023-06-01 DIAGNOSIS — K219 Gastro-esophageal reflux disease without esophagitis: Secondary | ICD-10-CM | POA: Diagnosis not present

## 2023-06-01 DIAGNOSIS — D6869 Other thrombophilia: Secondary | ICD-10-CM | POA: Diagnosis not present

## 2023-06-01 DIAGNOSIS — F330 Major depressive disorder, recurrent, mild: Secondary | ICD-10-CM | POA: Diagnosis not present

## 2023-06-01 DIAGNOSIS — M545 Low back pain, unspecified: Secondary | ICD-10-CM | POA: Diagnosis not present

## 2023-06-01 DIAGNOSIS — I272 Pulmonary hypertension, unspecified: Secondary | ICD-10-CM | POA: Diagnosis not present

## 2023-06-12 DIAGNOSIS — M47816 Spondylosis without myelopathy or radiculopathy, lumbar region: Secondary | ICD-10-CM | POA: Diagnosis not present

## 2023-06-12 DIAGNOSIS — F1120 Opioid dependence, uncomplicated: Secondary | ICD-10-CM | POA: Diagnosis not present

## 2023-09-21 DIAGNOSIS — Z1330 Encounter for screening examination for mental health and behavioral disorders, unspecified: Secondary | ICD-10-CM | POA: Diagnosis not present

## 2023-09-21 DIAGNOSIS — M47816 Spondylosis without myelopathy or radiculopathy, lumbar region: Secondary | ICD-10-CM | POA: Diagnosis not present

## 2023-09-22 ENCOUNTER — Other Ambulatory Visit (HOSPITAL_COMMUNITY)

## 2023-09-22 ENCOUNTER — Encounter (HOSPITAL_BASED_OUTPATIENT_CLINIC_OR_DEPARTMENT_OTHER): Payer: Self-pay | Admitting: Emergency Medicine

## 2023-09-22 ENCOUNTER — Inpatient Hospital Stay (HOSPITAL_BASED_OUTPATIENT_CLINIC_OR_DEPARTMENT_OTHER)
Admission: EM | Admit: 2023-09-22 | Discharge: 2023-09-24 | DRG: 309 | Disposition: A | Discharge status: Home / Self Care | Attending: Internal Medicine | Admitting: Internal Medicine

## 2023-09-22 ENCOUNTER — Emergency Department (HOSPITAL_BASED_OUTPATIENT_CLINIC_OR_DEPARTMENT_OTHER)

## 2023-09-22 ENCOUNTER — Emergency Department (HOSPITAL_BASED_OUTPATIENT_CLINIC_OR_DEPARTMENT_OTHER): Admitting: Radiology

## 2023-09-22 ENCOUNTER — Other Ambulatory Visit: Payer: Self-pay

## 2023-09-22 DIAGNOSIS — E119 Type 2 diabetes mellitus without complications: Secondary | ICD-10-CM

## 2023-09-22 DIAGNOSIS — R079 Chest pain, unspecified: Principal | ICD-10-CM | POA: Diagnosis present

## 2023-09-22 DIAGNOSIS — I2510 Atherosclerotic heart disease of native coronary artery without angina pectoris: Secondary | ICD-10-CM | POA: Diagnosis present

## 2023-09-22 DIAGNOSIS — Z820 Family history of epilepsy and other diseases of the nervous system: Secondary | ICD-10-CM | POA: Diagnosis not present

## 2023-09-22 DIAGNOSIS — I447 Left bundle-branch block, unspecified: Secondary | ICD-10-CM

## 2023-09-22 DIAGNOSIS — G8929 Other chronic pain: Secondary | ICD-10-CM | POA: Diagnosis not present

## 2023-09-22 DIAGNOSIS — Z90710 Acquired absence of both cervix and uterus: Secondary | ICD-10-CM

## 2023-09-22 DIAGNOSIS — K219 Gastro-esophageal reflux disease without esophagitis: Secondary | ICD-10-CM | POA: Diagnosis not present

## 2023-09-22 DIAGNOSIS — E785 Hyperlipidemia, unspecified: Secondary | ICD-10-CM | POA: Diagnosis not present

## 2023-09-22 DIAGNOSIS — Z7984 Long term (current) use of oral hypoglycemic drugs: Secondary | ICD-10-CM | POA: Diagnosis not present

## 2023-09-22 DIAGNOSIS — I5020 Unspecified systolic (congestive) heart failure: Secondary | ICD-10-CM | POA: Diagnosis not present

## 2023-09-22 DIAGNOSIS — I110 Hypertensive heart disease with heart failure: Secondary | ICD-10-CM | POA: Diagnosis present

## 2023-09-22 DIAGNOSIS — E1165 Type 2 diabetes mellitus with hyperglycemia: Secondary | ICD-10-CM | POA: Diagnosis not present

## 2023-09-22 DIAGNOSIS — I700 Atherosclerosis of aorta: Secondary | ICD-10-CM | POA: Diagnosis not present

## 2023-09-22 DIAGNOSIS — Z79899 Other long term (current) drug therapy: Secondary | ICD-10-CM | POA: Diagnosis not present

## 2023-09-22 DIAGNOSIS — R0789 Other chest pain: Secondary | ICD-10-CM | POA: Diagnosis not present

## 2023-09-22 DIAGNOSIS — Z7951 Long term (current) use of inhaled steroids: Secondary | ICD-10-CM | POA: Diagnosis not present

## 2023-09-22 DIAGNOSIS — J449 Chronic obstructive pulmonary disease, unspecified: Secondary | ICD-10-CM | POA: Diagnosis not present

## 2023-09-22 DIAGNOSIS — Z7901 Long term (current) use of anticoagulants: Secondary | ICD-10-CM

## 2023-09-22 DIAGNOSIS — I482 Chronic atrial fibrillation, unspecified: Principal | ICD-10-CM | POA: Diagnosis present

## 2023-09-22 DIAGNOSIS — I272 Pulmonary hypertension, unspecified: Secondary | ICD-10-CM | POA: Diagnosis present

## 2023-09-22 DIAGNOSIS — M069 Rheumatoid arthritis, unspecified: Secondary | ICD-10-CM | POA: Diagnosis not present

## 2023-09-22 DIAGNOSIS — F319 Bipolar disorder, unspecified: Secondary | ICD-10-CM | POA: Diagnosis present

## 2023-09-22 DIAGNOSIS — D72829 Elevated white blood cell count, unspecified: Secondary | ICD-10-CM | POA: Diagnosis present

## 2023-09-22 DIAGNOSIS — I452 Bifascicular block: Secondary | ICD-10-CM | POA: Diagnosis not present

## 2023-09-22 DIAGNOSIS — I5022 Chronic systolic (congestive) heart failure: Secondary | ICD-10-CM | POA: Diagnosis not present

## 2023-09-22 DIAGNOSIS — F17210 Nicotine dependence, cigarettes, uncomplicated: Secondary | ICD-10-CM | POA: Diagnosis present

## 2023-09-22 DIAGNOSIS — F419 Anxiety disorder, unspecified: Secondary | ICD-10-CM | POA: Diagnosis present

## 2023-09-22 DIAGNOSIS — I252 Old myocardial infarction: Secondary | ICD-10-CM

## 2023-09-22 DIAGNOSIS — Z801 Family history of malignant neoplasm of trachea, bronchus and lung: Secondary | ICD-10-CM

## 2023-09-22 DIAGNOSIS — Z818 Family history of other mental and behavioral disorders: Secondary | ICD-10-CM

## 2023-09-22 DIAGNOSIS — N179 Acute kidney failure, unspecified: Secondary | ICD-10-CM | POA: Diagnosis present

## 2023-09-22 DIAGNOSIS — Z66 Do not resuscitate: Secondary | ICD-10-CM | POA: Diagnosis not present

## 2023-09-22 DIAGNOSIS — I4891 Unspecified atrial fibrillation: Secondary | ICD-10-CM | POA: Diagnosis not present

## 2023-09-22 DIAGNOSIS — J439 Emphysema, unspecified: Secondary | ICD-10-CM | POA: Diagnosis not present

## 2023-09-22 LAB — BASIC METABOLIC PANEL
Anion gap: 12 (ref 5–15)
BUN: 25 mg/dL — ABNORMAL HIGH (ref 8–23)
CO2: 25 mmol/L (ref 22–32)
Calcium: 8.8 mg/dL — ABNORMAL LOW (ref 8.9–10.3)
Chloride: 98 mmol/L (ref 98–111)
Creatinine, Ser: 1.27 mg/dL — ABNORMAL HIGH (ref 0.44–1.00)
GFR, Estimated: 44 mL/min — ABNORMAL LOW (ref >60)
Glucose, Bld: 125 mg/dL — ABNORMAL HIGH (ref 70–99)
Potassium: 3.9 mmol/L (ref 3.5–5.1)
Sodium: 135 mmol/L (ref 135–145)

## 2023-09-22 LAB — HEMOGLOBIN A1C
Hgb A1c MFr Bld: 7.6 % — ABNORMAL HIGH (ref 4.8–5.6)
Mean Plasma Glucose: 171.42 mg/dL

## 2023-09-22 LAB — TROPONIN I (HIGH SENSITIVITY)
Troponin I (High Sensitivity): 16 ng/L (ref <18)
Troponin I (High Sensitivity): 15 ng/L (ref <18)

## 2023-09-22 LAB — CBC
HCT: 40.1 % (ref 36.0–46.0)
Hemoglobin: 12.8 g/dL (ref 12.0–15.0)
MCH: 25.5 pg — ABNORMAL LOW (ref 26.0–34.0)
MCHC: 31.9 g/dL (ref 30.0–36.0)
MCV: 80 fL (ref 80.0–100.0)
Platelets: 311 10*3/uL (ref 150–400)
RBC: 5.01 MIL/uL (ref 3.87–5.11)
RDW: 16.3 % — ABNORMAL HIGH (ref 11.5–15.5)
WBC: 12.9 10*3/uL — ABNORMAL HIGH (ref 4.0–10.5)
nRBC: 0 % (ref 0.0–0.2)

## 2023-09-22 LAB — TSH: TSH: 2.045 u[IU]/mL (ref 0.350–4.500)

## 2023-09-22 LAB — D-DIMER, QUANTITATIVE: D-Dimer, Quant: 0.36 ug{FEU}/mL (ref 0.00–0.50)

## 2023-09-22 LAB — GLUCOSE, CAPILLARY: Glucose-Capillary: 206 mg/dL — ABNORMAL HIGH (ref 70–99)

## 2023-09-22 MED ORDER — HYDROCODONE-ACETAMINOPHEN 10-325 MG PO TABS
1.0000 | ORAL_TABLET | Freq: Three times a day (TID) | ORAL | Status: DC | PRN
  Administered 2023-09-22 – 2023-09-24 (×4): 1 via ORAL
  Filled 2023-09-22 (×4): qty 1

## 2023-09-22 MED ORDER — DULOXETINE HCL 60 MG PO CPEP
60.0000 mg | ORAL_CAPSULE | Freq: Every day | ORAL | Status: DC
  Administered 2023-09-23 – 2023-09-24 (×2): 60 mg via ORAL
  Filled 2023-09-22 (×2): qty 1

## 2023-09-22 MED ORDER — NITROGLYCERIN IN D5W 200-5 MCG/ML-% IV SOLN: 0.0000 ug/min | INTRAVENOUS | Status: DC

## 2023-09-22 MED ORDER — UMECLIDINIUM-VILANTEROL 62.5-25 MCG/ACT IN AEPB
1.0000 | INHALATION_SPRAY | Freq: Every day | RESPIRATORY_TRACT | Status: DC
  Administered 2023-09-23 – 2023-09-24 (×2): 1 via RESPIRATORY_TRACT
  Filled 2023-09-22 (×2): qty 14

## 2023-09-22 MED ORDER — HEPARIN (PORCINE) 25000 UT/250ML-% IV SOLN
1400.0000 [IU]/h | INTRAVENOUS | Status: DC
  Administered 2023-09-22: 1000 [IU]/h via INTRAVENOUS
  Administered 2023-09-23: 1200 [IU]/h via INTRAVENOUS
  Filled 2023-09-22 (×2): qty 250

## 2023-09-22 MED ORDER — FUROSEMIDE 40 MG PO TABS: 40.0000 mg | ORAL_TABLET | ORAL | Status: DC

## 2023-09-22 MED ORDER — ONDANSETRON HCL 4 MG PO TABS: 4.0000 mg | ORAL_TABLET | Freq: Four times a day (QID) | ORAL | Status: DC | PRN

## 2023-09-22 MED ORDER — PRAVASTATIN SODIUM 40 MG PO TABS
40.0000 mg | ORAL_TABLET | Freq: Every day | ORAL | Status: DC
  Administered 2023-09-23 – 2023-09-24 (×2): 40 mg via ORAL
  Filled 2023-09-22 (×2): qty 1

## 2023-09-22 MED ORDER — LOSARTAN POTASSIUM 25 MG PO TABS
25.0000 mg | ORAL_TABLET | Freq: Every day | ORAL | Status: DC
  Administered 2023-09-23 – 2023-09-24 (×2): 25 mg via ORAL
  Filled 2023-09-22 (×2): qty 1

## 2023-09-22 MED ORDER — NITROGLYCERIN 0.4 MG SL SUBL
0.4000 mg | SUBLINGUAL_TABLET | SUBLINGUAL | Status: DC | PRN
  Administered 2023-09-22 (×5): 0.4 mg via SUBLINGUAL
  Filled 2023-09-22 (×4): qty 1

## 2023-09-22 MED ORDER — ALBUTEROL SULFATE (2.5 MG/3ML) 0.083% IN NEBU: 3.0000 mL | INHALATION_SOLUTION | Freq: Four times a day (QID) | RESPIRATORY_TRACT | Status: DC | PRN

## 2023-09-22 MED ORDER — ASPIRIN 81 MG PO CHEW
162.0000 mg | CHEWABLE_TABLET | Freq: Once | ORAL | Status: AC
  Administered 2023-09-22: 162 mg via ORAL
  Filled 2023-09-22: qty 2

## 2023-09-22 MED ORDER — PANTOPRAZOLE SODIUM 40 MG PO TBEC
40.0000 mg | DELAYED_RELEASE_TABLET | Freq: Every day | ORAL | Status: DC
  Administered 2023-09-23 – 2023-09-24 (×2): 40 mg via ORAL
  Filled 2023-09-22 (×2): qty 1

## 2023-09-22 MED ORDER — ACETAMINOPHEN 650 MG RE SUPP: 650.0000 mg | Freq: Four times a day (QID) | RECTAL | Status: DC | PRN

## 2023-09-22 MED ORDER — LACTATED RINGERS IV BOLUS
500.0000 mL | Freq: Once | INTRAVENOUS | Status: AC
  Administered 2023-09-22: 500 mL via INTRAVENOUS

## 2023-09-22 MED ORDER — ONDANSETRON HCL 4 MG/2ML IJ SOLN: 4.0000 mg | Freq: Four times a day (QID) | INTRAMUSCULAR | Status: DC | PRN

## 2023-09-22 MED ORDER — ACETAMINOPHEN 325 MG PO TABS
650.0000 mg | ORAL_TABLET | Freq: Four times a day (QID) | ORAL | Status: DC | PRN
  Administered 2023-09-23: 650 mg via ORAL
  Filled 2023-09-22: qty 2

## 2023-09-22 NOTE — ED Notes (Signed)
 Adm 1 sl nitroglycerin pain level 6 prior, now 4. Bp 151/103

## 2023-09-22 NOTE — H&P (Addendum)
 History and Physical    Patient: Theresa Mendoza ZOX:[id number] DOB: [birth year] DOA: 09/22/2023 DOS: the patient was seen and examined on 09/22/2023 PCP: [name], MD  Patient coming from: Home  Chief Complaint:  Chief Complaint  Patient presents with   Chest Pain   HPI: Theresa Mendoza is a 78 y.o. female with medical history significant of chronic atrial fibrillation on Eliquis, COPD, rheumatoid arthritis, chronic back pain, CAD, chronic systolic heart failure, hypertension, depression and anxiety, type 2 diabetes presenting to the ED with chest pain.  Patient reports that she was in her usual state of health until around 2 AM today.  She was laying in bed when she suddenly developed substernal chest pain/heaviness with radiation down the left arm.  She continued to rest in bed hoping that this would pass and ultimately took her home pain medication which provided mild relief.  She reports continuing to lay in bed until late morning/early afternoon.  Given that her chest pain did not resolve completely, her son brought her to [facility] ED for further evaluation.  She denies any fevers, chills, vomiting, shortness of breath, cough, abdominal pain, urinary changes.  She does endorse intermittent nausea and palpitations since her chest pain began.  ED course while at [facility] ED.  Vital signs notable for elevated blood pressure and intermittent bradycardia.  EKG revealed atrial fibrillation with left bundle branch block.  CBC with mild leukocytosis, otherwise unremarkable.  BMP with mild hyperglycemia, creatinine 1.27 (baseline appears to be around 0.8).  Troponin initially at 16, repeat at 15.  Chest x-ray without any acute abnormality.  ED provider administered nitroglycerin and aspirin with subsequent improvement in patient's chest pain.  ED provider discussed with cardiology, Dr. Elease Hashimoto, who recommended heparin drip and admission to medicine service.  Cardiology will see in consult.   Patient transferred to [facility] for further evaluation.  During my encounter, patient is resting comfortably and denies any active chest pain.   Review of Systems: As mentioned in the history of present illness. All other systems reviewed and are negative. Past Medical History:  Diagnosis Date   A-fib [facility])    Anxiety    Bipolar 1 disorder ([facility])    Bowel obstruction ([facility])    Cataract    Chronic atrial fibrillation ([facility]) 09/2014   COPD (chronic obstructive pulmonary disease) ([facility])    Dyslipidemia, goal LDL below 70 12/25/2015   Encounter for tobacco use cessation counseling 12/04/2017   Glaucoma    Hx of migraine headaches    Memory loss    Mitral regurgitation    mild to moderate on echo 09/2014   Old MI (myocardial infarction) 09/2014   cath with normal coronary arteries and EF 50-55%   Osteoarthritis    Pulmonary HTN ([facility]) 09/2014   Mild with PASP at cath   RBBB    Rheumatoid arthritis ([facility])    Tobacco abuse    Past Surgical History:  Procedure Laterality Date   ADENOIDECTOMY     BACK SURGERY     bowel obstruction     CARDIAC CATHETERIZATION  09/2014   normal coronary arteries with mild pulmonary HTN, low normal LVF   CHOLECYSTECTOMY     NOSE SURGERY     TONSILLECTOMY     TRANSESOPHAGEAL ECHOCARDIOGRAM WITH CARDIOVERSION  09/2014   VAGINAL HYSTERECTOMY     with BSO   Social History:  reports that she has been smoking cigarettes. She has a 60 pack-year smoking history. She has never  used smokeless tobacco. She reports that she does not drink alcohol and does not use drugs.  Allergies  Allergen Reactions   Lithium     Patient became suicidal   Tegretol [Carbamazepine]     UNK reaction    Family History  Problem Relation Age of Onset   Tuberculosis Father    Depression Father    Alzheimer's disease Mother    Lung cancer Mother     Prior to Admission medications   Medication Sig Start Date End Date Taking? Authorizing Provider  apixaban (ELIQUIS) 5  MG TABS tablet Take 1 tablet (5 mg total) by mouth 2 (two) times daily. NEEDS CARDIOLOGY APPT FOR ELIQUIS REFILLS, PLEASE CALL OFFICE 01/19/23  Yes Turner, Cornelious Bryant, MD  digoxin (LANOXIN) 0.125 MG tablet TAKE 1 TABLET BY MOUTH EVERY DAY 01/26/23  Yes Turner, Cornelious Bryant, MD  diltiazem (CARDIZEM CD) 240 MG 24 hr capsule Take 240 mg by mouth daily. 11/01/15  Yes [provider]  DULoxetine (CYMBALTA) 60 MG capsule TAKE 2 CAPSULES BY MOUTH EVERY DAY Patient taking differently: Take 120 mg by mouth daily. 10/09/19  Yes Plovsky, Earvin Hansen, MD  empagliflozin (JARDIANCE) 10 MG TABS tablet Take 1 tablet (10 mg total) by mouth daily before breakfast. 10/19/20  Yes Winfrey, Kimberlee Nearing, MD  furosemide (LASIX) 40 MG tablet Take 1 tablet (40 mg total) by mouth every other day. 10/18/21  Yes Almon Hercules, MD  losartan (COZAAR) 25 MG tablet TAKE 1 TABLET (25 MG TOTAL) BY MOUTH DAILY. 04/13/22  Yes Turner, Cornelious Bryant, MD  metFORMIN (GLUCOPHAGE) 500 MG tablet Take 1 tablet (500 mg total) by mouth 2 (two) times daily with a meal. 10/15/21 09/22/23 Yes Almon Hercules, MD  metoprolol succinate (TOPROL-XL) 25 MG 24 hr tablet Take 3 tablets (75 mg total) by mouth daily. 12/14/21  Yes Gaston Islam., NP  omeprazole (PRILOSEC) 40 MG capsule Take 40 mg by mouth in the morning. Before breakfast 09/07/20  Yes [provider]  pravastatin (PRAVACHOL) 40 MG tablet Take 1 tablet by mouth daily. 03/17/21  Yes [provider]  albuterol (VENTOLIN HFA) 108 (90 Base) MCG/ACT inhaler Inhale 2 puffs into the lungs every 6 (six) hours as needed for wheezing or shortness of breath. 10/15/21   Almon Hercules, MD  ALPRAZolam (XANAX) 0.25 MG tablet Take 0.25 mg by mouth daily as needed. 10/20/20   [provider]  doxycycline (MONODOX) 100 MG capsule Take 1 capsule (100 mg total) by mouth 2 (two) times daily. 10/15/21   Almon Hercules, MD  HYDROcodone-acetaminophen (NORCO) 10-325 MG tablet Take 1 tablet by mouth 5 (five) times daily as  needed for moderate pain or severe pain.    [provider]  umeclidinium-vilanterol (ANORO ELLIPTA) 62.5-25 MCG/ACT AEPB Inhale 1 puff into the lungs daily. 10/15/21   Almon Hercules, MD    Physical Exam: Vitals:   09/22/23 1500 09/22/23 1704 09/22/23 1932 09/22/23 2024  BP: (!) 120/47  (!) 161/66 (!) 154/54  Pulse: (!) 43  62 64  Resp: 20  20 19   Temp:  97.6 F (36.4 C) 97.7 F (36.5 C) 97.7 F (36.5 C)  TempSrc:  Oral Oral Oral  SpO2: 98%  97% 100%  Weight:      Height:       Physical Exam Constitutional:      Appearance: Normal appearance. She is not ill-appearing.  HENT:     Head: Normocephalic and atraumatic.  Mouth/Throat:     Mouth: Mucous membranes are dry.     Pharynx: Oropharynx is clear. No oropharyngeal exudate.  Eyes:     General: No scleral icterus.    Extraocular Movements: Extraocular movements intact.     Conjunctiva/sclera: Conjunctivae normal.     Pupils: Pupils are equal, round, and reactive to light.  Cardiovascular:     Heart sounds: No murmur heard.    No friction rub. No gallop.     Comments: Atrial fibrillation with bradycardia (rates in 30s-40s during my encounter).  Pulmonary:     Effort: Pulmonary effort is normal.     Breath sounds: Normal breath sounds. No wheezing, rhonchi or rales.  Abdominal:     General: Bowel sounds are normal. There is no distension.     Palpations: Abdomen is soft.     Tenderness: There is no abdominal tenderness. There is no guarding or rebound.  Musculoskeletal:        General: No swelling. Normal range of motion.     Cervical back: Normal range of motion.  Skin:    General: Skin is warm and dry.  Neurological:     General: No focal deficit present.     Mental Status: She is alert and oriented to person, place, and time.  Psychiatric:        Mood and Affect: Mood normal.        Behavior: Behavior normal.     Data Reviewed:  There are no new results to review at this time.     Latest Ref Rng  & Units 09/22/2023   12:57 PM 10/15/2021    3:30 AM 10/14/2021    3:52 AM  CBC  WBC 4.0 - 10.5 K/uL 12.9  19.9  8.1   Hemoglobin 12.0 - 15.0 g/dL 09.8  11.9  14.7   Hematocrit 36.0 - 46.0 % 40.1  46.4  48.0   Platelets 150 - 400 K/uL 311  331  300       Latest Ref Rng & Units 09/22/2023   12:57 PM 10/15/2021    3:30 AM 10/14/2021    3:52 AM  BMP  Glucose 70 - 99 mg/dL 829  562  130   BUN 8 - 23 mg/dL 25  26  12    Creatinine 0.44 - 1.00 mg/dL 8.65  7.84  6.96   Sodium 135 - 145 mmol/L 135  138  135   Potassium 3.5 - 5.1 mmol/L 3.9  3.9  3.8   Chloride 98 - 111 mmol/L 98  102  99   CO2 22 - 32 mmol/L 25  28  24    Calcium 8.9 - 10.3 mg/dL 8.8  9.4  9.2    Troponin: 16 > 15  DG Chest Port 1 View Result Date: 09/22/2023 CLINICAL DATA:  295284 Chest pain 132440 EXAM: PORTABLE CHEST 1 VIEW COMPARISON:  October 13, 2021 FINDINGS: The cardiomediastinal silhouette is unchanged in contour.Atherosclerotic calcifications of the aorta. Similar background of diffuse reticular prominence consistent with history of emphysema. No pleural effusion. No pneumothorax. No acute pleuroparenchymal abnormality. Similar sclerotic focus of the proximal LEFT humeral head. IMPRESSION: No acute cardiopulmonary abnormality. Given underlying emphysema, recommend evaluation for candidacy for annual lung cancer screening CT. Electronically Signed   By: Meda Klinefelter M.D.   On: 09/22/2023 13:18     Assessment and Plan: No notes have been filed under this hospital service. Service: Hospitalist   Chest pain History of CAD Patient presenting with chest pain that  felt similar to chest pain in the past when she was found to have CAD.  Chest pain has since improved and resolved after sublingual nitroglycerin administration.  She was also given a dose of aspirin.  EKG showing atrial fibrillation with left bundle branch block, without any acute ischemic changes.  Troponin levels flat (16 > 15).  This does not appear to be ACS.   She is currently chest pain-free.  ED provider discussed with cardiology, Dr. Elease Hashimoto, who recommended admission and to start heparin drip.  Cardiology will formally consult tomorrow, unclear if pursuing ischemic evaluation. Cardiac catheterization in 2016 showed LVEF 50-55% with normal epicardial coronary arteries with dominant complex coronary artery system.  She is currently hemodynamically stable. -Cardiology consulted -Heparin drip -Sublingual nitroglycerin as needed -Zofran every 6 hours as needed for nausea -Resumed home pravastatin -Heart healthy diet -Telemetry -Admit to inpatient order placed by accepting physician  Atrial fibrillation Left bundle branch block Noted on EKG today.  Patient with low ventricular rates, ranging in the 40s to 50s.  She is on Eliquis 5 mg twice daily for anticoagulation at home, though currently transition to heparin drip per cardiology recommendation.  She is on Toprol-XL 75 mg daily and Cardizem 240 mg daily at home.  Will hold both of these medications for now until cardiology evaluation given already low ventricular rates. -Heparin drip -Cardiology consulted -Holding home Toprol-XL and Cardizem -Telemetry -f/u TSH  Chronic systolic heart failure Last echocardiogram in 2022 showing LVEF 40 to 45%, mildly decreased LV function, global hypokinesis, moderately reduced RV systolic function, moderate LA dilation.  Patient is on Lasix 40 mg every other day, losartan 25 mg daily, Jardiance 10 mg daily, digoxin 0.125 mg every other day at home.  She is euvolemic on exam.  Will resume her home losartan as this should not affect heart rates significantly.  Will hold home Lasix, Jardiance, digoxin for now until cardiology evaluation. -Cardiology consulted -Resume home losartan -Holding home Jardiance, digoxin, Lasix -Strict I/O's -Daily weights  AKI Baseline creatinine appears to be around 0.8.  On admission, creatinine 1.27.  She is not volume overloaded on  exam.  Unclear if her presenting kidney function is a sign of chronic kidney disease given underlying type 2 diabetes.  Will provide trial of IV fluids with a 500 cc bolus.  She is on Lasix at home which would make urine lytes inaccurate.  Will obtain renal ultrasound to rule out obstructive uropathy. -Trial of IV fluids with 500 cc LR bolus -Follow-up renal ultrasound -Trend kidney function -Monitor urine output -Avoid nephrotoxic medications as able  HLD -Continue home pravastatin 40 mg daily  Type 2 diabetes On metformin 500 mg twice daily and Jardiance 10 mg daily at home.  Hemoglobin A1c 7.3% about 1 year ago.  Blood glucose 125 on BMP today. -Follow-up hemoglobin A1c -Trend CBGs, goal 140-180 -Holding home metformin and Jardiance  COPD -Reports quitting smoking about a year ago -On Anoro Ellipta and albuterol at home -This issue is chronic and stable -Resumed home inhalers  Depression and anxiety -Patient on Cymbalta 120 mg daily at home -Resumed at half of home dose  GERD -Continue PPI  Chronic back pain -PDMP reviewed and appropriate -Resume home Norco 10-325mg  3 times daily as needed    Advance Care Planning:   Code Status: Limited: Do not attempt resuscitation (DNR) -DNR-LIMITED -Do Not Intubate/DNI Patient is alert and oriented x4. Discussed CODE status at length. Patient's wishes best align with DNR-Limited CODE status.  Consults: cardiology  Family Communication: no family at bedside  Severity of Illness: The appropriate patient status for this patient is INPATIENT. Inpatient status is judged to be reasonable and necessary in order to provide the required intensity of service to ensure the patient's safety. The patient's presenting symptoms, physical exam findings, and initial radiographic and laboratory data in the context of their chronic comorbidities is felt to place them at high risk for further clinical deterioration. Furthermore, it is not anticipated that  the patient will be medically stable for discharge from the hospital within 2 midnights of admission.   * I certify that at the point of admission it is my clinical judgment that the patient will require inpatient hospital care spanning beyond 2 midnights from the point of admission due to high intensity of service, high risk for further deterioration and high frequency of surveillance required.*  Portions of this note were generated with Dragon dictation software. Dictation errors may occur despite best attempts at proofreading.   Author: Briscoe Burns, MD 09/22/2023 9:48 PM  For on call review [email or web address].

## 2023-09-22 NOTE — Progress Notes (Signed)
 PHARMACY - ANTICOAGULATION CONSULT NOTE  Pharmacy Consult for heparin  Indication: chest pain/ACS , hx of afib   Allergies  Allergen Reactions   Lithium     Patient became suicidal   Tegretol [Carbamazepine]     UNK reaction    Patient Measurements: Height: 5\' 4"  (162.6 cm) Weight: 74.8 kg (165 lb) IBW/kg (Calculated) : 54.7 Heparin Dosing Weight: 70.3kg   Vital Signs: Temp: 97.7 F (36.5 C) (03/08 1235) Temp Source: Oral (03/08 1235) BP: 137/49 (03/08 1400) Pulse Rate: 55 (03/08 1400)  Labs: Recent Labs    09/22/23 1257  HGB 12.8  HCT 40.1  PLT 311  CREATININE 1.27*  TROPONINIHS 16    Estimated Creatinine Clearance: 37.3 mL/min (A) (by C-G formula based on SCr of 1.27 mg/dL (H)).   Medical History: Past Medical History:  Diagnosis Date   A-fib (HCC)    Anxiety    Bipolar 1 disorder (HCC)    Bowel obstruction (HCC)    Cataract    Chronic atrial fibrillation (HCC) 09/2014   COPD (chronic obstructive pulmonary disease) (HCC)    Dyslipidemia, goal LDL below 70 12/25/2015   Encounter for tobacco use cessation counseling 12/04/2017   Glaucoma    Hx of migraine headaches    Memory loss    Mitral regurgitation    mild to moderate on echo 09/2014   Old MI (myocardial infarction) 09/2014   cath with normal coronary arteries and EF 50-55%   Osteoarthritis    Pulmonary HTN (HCC) 09/2014   Mild with PASP at cath   RBBB    Rheumatoid arthritis (HCC)    Tobacco abuse     Assessment: Patient presenting with CC of SOB, nausea, reporting the same feeling as when she had a heart attack. Patient with PMH of afib on Eliquis PTA, last dose 11:30 am, trop 16. Hemoglobin 12.8 and PLTs 311. Cardiology consulted and planned admission.   Pharmacy consulted to dose heparin.    Goal of Therapy:  Heparin level 0.3-0.7 units/ml aPTT 66-103 Monitor platelets by anticoagulation protocol: Yes   Plan:  No bolus with recent DOAC intake.  Start heparin infusion at  1000 units/hr at 23:30, 12 hours from last dose of Eliquis.  Check anti-Xa level in 8 hours and daily while on heparin, will utilize aPTT for first check, trend for correlation.  Continue to monitor H&H and platelets  Estill Batten, PharmD, BCCCP  09/22/2023,3:32 PM

## 2023-09-22 NOTE — ED Notes (Addendum)
 Theresa Jewel, rn updated ,pt son states she actually took 2 doses of her eliquis this am. Pharmacist heather wilson also updated.

## 2023-09-22 NOTE — ED Provider Notes (Signed)
 [place] EMERGENCY DEPARTMENT AT [facility] Provider Note   CSN: [id number] Arrival date & time: 09/22/23  1216     History  Chief Complaint  Patient presents with   Chest Pain    Theresa Mendoza is a 78 y.o. female.  HPI A 76-year-old female history of COPD, tobacco abuse, coronary artery disease, A-fib, hyperlipidemia, on chronic anticoagulation with Eliquis, chronic pain, history of congestive heart failure, last echo with EF 45%, presents today complaining of chest pain.  She states this is in the anterior chest and radiates to her left arm similar to prior cardiac type pain.  She did not take any medications for this.  She describes some associated dyspnea.  She takes her chronic pain medicines but denies any aspirin or nitroglycerin.    Home Medications Prior to Admission medications   Medication Sig Start Date End Date Taking? Authorizing Provider  apixaban (ELIQUIS) 5 MG TABS tablet Take 1 tablet (5 mg total) by mouth 2 (two) times daily. NEEDS CARDIOLOGY APPT FOR ELIQUIS REFILLS, PLEASE CALL OFFICE 01/19/23  Yes Turner, Cornelious Bryant, MD  digoxin (LANOXIN) 0.125 MG tablet TAKE 1 TABLET BY MOUTH EVERY DAY 01/26/23  Yes Turner, Cornelious Bryant, MD  diltiazem (CARDIZEM CD) 240 MG 24 hr capsule Take 240 mg by mouth daily. 11/01/15  Yes [provider]  DULoxetine (CYMBALTA) 60 MG capsule TAKE 2 CAPSULES BY MOUTH EVERY DAY Patient taking differently: Take 120 mg by mouth daily. 10/09/19  Yes Plovsky, Earvin Hansen, MD  empagliflozin (JARDIANCE) 10 MG TABS tablet Take 1 tablet (10 mg total) by mouth daily before breakfast. 10/19/20  Yes Winfrey, Kimberlee Nearing, MD  furosemide (LASIX) 40 MG tablet Take 1 tablet (40 mg total) by mouth every other day. 10/18/21  Yes Almon Hercules, MD  losartan (COZAAR) 25 MG tablet TAKE 1 TABLET (25 MG TOTAL) BY MOUTH DAILY. 04/13/22  Yes Turner, Cornelious Bryant, MD  metFORMIN (GLUCOPHAGE) 500 MG tablet Take 1 tablet (500 mg total) by mouth 2 (two) times daily with a meal.  10/15/21 09/22/23 Yes Almon Hercules, MD  metoprolol succinate (TOPROL-XL) 25 MG 24 hr tablet Take 3 tablets (75 mg total) by mouth daily. 12/14/21  Yes Gaston Islam., NP  omeprazole (PRILOSEC) 40 MG capsule Take 40 mg by mouth in the morning. Before breakfast 09/07/20  Yes [provider]  pravastatin (PRAVACHOL) 40 MG tablet Take 1 tablet by mouth daily. 03/17/21  Yes [provider]  albuterol (VENTOLIN HFA) 108 (90 Base) MCG/ACT inhaler Inhale 2 puffs into the lungs every 6 (six) hours as needed for wheezing or shortness of breath. 10/15/21   Almon Hercules, MD  ALPRAZolam (XANAX) 0.25 MG tablet Take 0.25 mg by mouth daily as needed. 10/20/20   [provider]  doxycycline (MONODOX) 100 MG capsule Take 1 capsule (100 mg total) by mouth 2 (two) times daily. 10/15/21   Almon Hercules, MD  HYDROcodone-acetaminophen (NORCO) 10-325 MG tablet Take 1 tablet by mouth 5 (five) times daily as needed for moderate pain or severe pain.    [provider]  umeclidinium-vilanterol (ANORO ELLIPTA) 62.5-25 MCG/ACT AEPB Inhale 1 puff into the lungs daily. 10/15/21   Almon Hercules, MD      Allergies    Lithium and Tegretol [carbamazepine]    Review of Systems   Review of Systems  Physical Exam Updated Vital Signs BP (!) 137/49   Pulse (!) 55   Temp 97.7 F (36.5 C) (Oral)  Resp (!) 21   Ht 1.626 m (5\' 4" )   Wt 74.8 kg   SpO2 94%   BMI 28.32 kg/m  Physical Exam Vitals and nursing note reviewed.  HENT:     Head: Normocephalic.  Eyes:     Pupils: Pupils are equal, round, and reactive to light.  Cardiovascular:     Rate and Rhythm: Normal rate. Rhythm irregular.     Heart sounds: Murmur heard.     Systolic murmur is present.  Pulmonary:     Effort: Pulmonary effort is normal.     Breath sounds: Examination of the right-lower field reveals rhonchi. Examination of the left-lower field reveals rhonchi. Rhonchi present.  Abdominal:     Palpations: Abdomen is soft.   Musculoskeletal:        General: Normal range of motion.     Cervical back: Normal range of motion.  Skin:    General: Skin is warm.     Capillary Refill: Capillary refill takes less than 2 seconds.  Neurological:     General: No focal deficit present.  Psychiatric:        Mood and Affect: Mood normal.     ED Results / Procedures / Treatments   Labs (all labs ordered are listed, but only abnormal results are displayed) Labs Reviewed  BASIC METABOLIC PANEL - Abnormal; Notable for the following components:      Result Value   Glucose, Bld 125 (*)    BUN 25 (*)    Creatinine, Ser 1.27 (*)    Calcium 8.8 (*)    GFR, Estimated 44 (*)    All other components within normal limits  CBC - Abnormal; Notable for the following components:   WBC 12.9 (*)    MCH 25.5 (*)    RDW 16.3 (*)    All other components within normal limits  TROPONIN I (HIGH SENSITIVITY)  TROPONIN I (HIGH SENSITIVITY)    EKG EKG Interpretation Date/Time:  Saturday September 22 2023 12:35:33 EST Ventricular Rate:  70 PR Interval:    QRS Duration:  160 QT Interval:  434 QTC Calculation: 469 R Axis:   -73  Text Interpretation: Atrial fibrillation Right bundle branch block LVH with IVCD and secondary repol abnrm Inferior infarct, acute (RCA) Lateral leads are also involved Probable RV involvement, suggest recording right precordial leads Confirmed by Margarita Grizzle [phone]) on 09/22/2023 12:38:21 PM  Radiology DG Chest Port 1 View Result Date: 09/22/2023 CLINICAL DATA:  [id number] Chest pain [id number] EXAM: PORTABLE CHEST 1 VIEW COMPARISON:  October 13, 2021 FINDINGS: The cardiomediastinal silhouette is unchanged in contour.Atherosclerotic calcifications of the aorta. Similar background of diffuse reticular prominence consistent with history of emphysema. No pleural effusion. No pneumothorax. No acute pleuroparenchymal abnormality. Similar sclerotic focus of the proximal LEFT humeral head. IMPRESSION: No acute cardiopulmonary  abnormality. Given underlying emphysema, recommend evaluation for candidacy for annual lung cancer screening CT. Electronically Signed   By: Meda Klinefelter M.D.   On: 09/22/2023 13:18    Procedures .Critical Care  Performed by: Margarita Grizzle, MD Authorized by: Margarita Grizzle, MD   Critical care provider statement:    Critical care time (minutes):  30   Critical care end time:  09/22/2023 3:23 PM   Critical care was necessary to treat or prevent imminent or life-threatening deterioration of the following conditions:  Cardiac failure     Medications Ordered in ED Medications  nitroGLYCERIN (NITROSTAT) SL tablet 0.4 mg (0.4 mg Sublingual Given 09/22/23 1319)  aspirin chewable tablet  162 mg (162 mg Oral Given 09/22/23 1313)    ED Course/ Medical Decision Making/ A&P Clinical Course as of 09/22/23 1537  Sat Sep 22, 2023  1537 Troponin and repeat troponin within normal limits [DR]  1537 CBC with mild leukocytosis otherwise within normal limits [DR]  1537 Chest x-Shantoria Ellwood without acute abnormality [DR]    Clinical Course User Index [DR] Margarita Grizzle, MD                                 Medical Decision Making Amount and/or Complexity of Data Reviewed Labs: ordered. Radiology: ordered.  Risk OTC drugs. Prescription drug management.   79 year old female history of CAD presents today complaining of chest pain.  It began during the night and has been ongoing.  EKG with left bundle branch block and A-fib which is chronic in nature. Reviewed last cardiac catheterization done at [facility] in 2016.  Reported normal left ventricle function with EF 50 to 55% and normal epicardial coronary arteries with dominant complex coronary artery system Patient with chest pain DDX Patient seen and evaluated for chest pain.  Differential diagnosis of serious/life threatening causes of chest pain includes ACS, other diseases of the heart such as myocarditis or pericarditis, lung etiologies such  as infection or pneumothorax, diseases of the great vessels such as aortic dissection or AAA, pulmonary embolism, or GI sources such as cholecystitis or other upper abdominal causes. Doubt ACS- heart score documented, EKG reviewed, Given the timing of pain to ER presentation, single troponin 16 so doubt NSTEMI troponin and repeat troponin obtained and WNL Pain improved from 5 to 2 after nitro Doubt myocarditis/pericarditis/tamponade based on history, review of ekg and labs Doubt aortic dissection based on history and review of imaging Doubt intrinsic lung causes such as pneumonia or pneumothorax, based on history, physical exam, and studies obtained. Doubt PE based on history, physical exam, and PERC Doubt acute GI etiology requiring intervention based on history, physical exam and labs. Patient appears stable for discharge. Return precautions and need for follow up discussed and patient voices understanding  Patient with known coronary artery disease she does presents today complaining of pain consistent with prior coronary artery disease.  She has left bundle branch block on EKG.  Initial troponin is 16.  Patient's pain improved here with nitroglycerin and she was given aspirin.  Plan consultation to patient's cardiologist Discussed with Dr. Elease Hashimoto and advises to heparinize.  Request admission to hospitalist service and cards will see in consult. Discussed care with patient and son and they voiced understanding of plan Care discussed with Dr. Leonides Cave who will admit       Final Clinical Impression(s) / ED Diagnoses Final diagnoses:  Chest pain, unspecified type    Rx / DC Orders ED Discharge Orders     None         Margarita Grizzle, MD 09/22/23 1538

## 2023-09-22 NOTE — ED Notes (Signed)
Charlie with cl called for transport

## 2023-09-22 NOTE — ED Notes (Signed)
 Pt complained of sudden sharp pain under left breast level 6 at 1831,given 1 sl nitroglycerin ,pt now at a level 1.5.bp initially 150/58 now 109/48

## 2023-09-22 NOTE — ED Triage Notes (Signed)
 Pt caox4 c/o chest tightness with SOB, nausea and dizziness that started last night while watching TV. PMH MI, afib. Pt initially states "This is what it felt like when I have a heart attack," then stated "This is how I felt last time I had pneumonia."

## 2023-09-23 ENCOUNTER — Inpatient Hospital Stay (HOSPITAL_COMMUNITY)

## 2023-09-23 DIAGNOSIS — R079 Chest pain, unspecified: Secondary | ICD-10-CM

## 2023-09-23 LAB — BASIC METABOLIC PANEL
Anion gap: 10 (ref 5–15)
BUN: 16 mg/dL (ref 8–23)
CO2: 25 mmol/L (ref 22–32)
Calcium: 8.7 mg/dL — ABNORMAL LOW (ref 8.9–10.3)
Chloride: 103 mmol/L (ref 98–111)
Creatinine, Ser: 1.11 mg/dL — ABNORMAL HIGH (ref 0.44–1.00)
GFR, Estimated: 52 mL/min — ABNORMAL LOW (ref >60)
Glucose, Bld: 178 mg/dL — ABNORMAL HIGH (ref 70–99)
Potassium: 4.1 mmol/L (ref 3.5–5.1)
Sodium: 138 mmol/L (ref 135–145)

## 2023-09-23 LAB — CBC
HCT: 37.9 % (ref 36.0–46.0)
Hemoglobin: 11.7 g/dL — ABNORMAL LOW (ref 12.0–15.0)
MCH: 24.9 pg — ABNORMAL LOW (ref 26.0–34.0)
MCHC: 30.9 g/dL (ref 30.0–36.0)
MCV: 80.6 fL (ref 80.0–100.0)
Platelets: 308 10*3/uL (ref 150–400)
RBC: 4.7 MIL/uL (ref 3.87–5.11)
RDW: 16 % — ABNORMAL HIGH (ref 11.5–15.5)
WBC: 11.1 10*3/uL — ABNORMAL HIGH (ref 4.0–10.5)
nRBC: 0 % (ref 0.0–0.2)

## 2023-09-23 LAB — APTT
aPTT: 37 s — ABNORMAL HIGH (ref 24–36)
aPTT: 45 s — ABNORMAL HIGH (ref 24–36)
aPTT: 52 s — ABNORMAL HIGH (ref 24–36)

## 2023-09-23 LAB — GLUCOSE, CAPILLARY
Glucose-Capillary: 126 mg/dL — ABNORMAL HIGH (ref 70–99)
Glucose-Capillary: 137 mg/dL — ABNORMAL HIGH (ref 70–99)
Glucose-Capillary: 130 mg/dL — ABNORMAL HIGH (ref 70–99)
Glucose-Capillary: 119 mg/dL — ABNORMAL HIGH (ref 70–99)

## 2023-09-23 LAB — MAGNESIUM: Magnesium: 1.4 mg/dL — ABNORMAL LOW (ref 1.7–2.4)

## 2023-09-23 LAB — HEPARIN LEVEL (UNFRACTIONATED): Heparin Unfractionated: >1.1 [IU]/mL — ABNORMAL HIGH (ref 0.30–0.70)

## 2023-09-23 MED ORDER — MAGNESIUM SULFATE 2 GM/50ML IV SOLN
2.0000 g | Freq: Once | INTRAVENOUS | Status: AC
  Administered 2023-09-23: 2 g via INTRAVENOUS
  Filled 2023-09-23: qty 50

## 2023-09-23 MED ORDER — MELATONIN 5 MG PO TABS
5.0000 mg | ORAL_TABLET | Freq: Every evening | ORAL | Status: DC | PRN
  Administered 2023-09-23: 5 mg via ORAL
  Filled 2023-09-23: qty 1

## 2023-09-23 NOTE — Progress Notes (Signed)
 PHARMACY - ANTICOAGULATION CONSULT NOTE  Pharmacy Consult for heparin  Indication: chest pain/ACS , hx of afib   Allergies  Allergen Reactions   Lithium     Patient became suicidal   Tegretol [Carbamazepine] Other (See Comments)    UNK reaction    Patient Measurements: Height: 5\' 4"  (162.6 cm) Weight: 71.8 kg (158 lb 4.8 oz) IBW/kg (Calculated) : 54.7 Heparin Dosing Weight: 70.3kg   Vital Signs: Temp: 97.8 F (36.6 C) (03/09 1943) Temp Source: Oral (03/09 1943) BP: 147/70 (03/09 1943) Pulse Rate: 76 (03/09 1943)  Labs: Recent Labs    09/22/23 1257 09/22/23 1457 09/23/23 0342 09/23/23 1102 09/23/23 2128  HGB 12.8  --  11.7*  --   --   HCT 40.1  --  37.9  --   --   PLT 311  --  308  --   --   APTT  --   --  37* 45* 52*  HEPARINUNFRC  --   --  >1.10*  --   --   CREATININE 1.27*  --  1.11*  --   --   TROPONINIHS 16 15  --   --   --     Estimated Creatinine Clearance: 41.9 mL/min (A) (by C-G formula based on SCr of 1.11 mg/dL (H)).   Medical History: Past Medical History:  Diagnosis Date   A-fib (HCC)    Anxiety    Bipolar 1 disorder (HCC)    Bowel obstruction (HCC)    Cataract    Chronic atrial fibrillation (HCC) 09/2014   COPD (chronic obstructive pulmonary disease) (HCC)    Dyslipidemia, goal LDL below 70 12/25/2015   Encounter for tobacco use cessation counseling 12/04/2017   Glaucoma    Hx of migraine headaches    Memory loss    Mitral regurgitation    mild to moderate on echo 09/2014   Old MI (myocardial infarction) 09/2014   cath with normal coronary arteries and EF 50-55%   Osteoarthritis    Pulmonary HTN (HCC) 09/2014   Mild with PASP at cath   RBBB    Rheumatoid arthritis (HCC)    Tobacco abuse     Assessment: Patient presenting with CC of SOB, nausea, reporting the same feeling as when she had a heart attack. Patient with PMH of afib on Eliquis PTA, last dose 11:30 am, trop 16. Hemoglobin 12.8 and PLTs 311. Cardiology consulted and  planned admission. Pharmacy consulted to dose heparin. No bolus with recent DOAC intake.   aPTT (52) subtherapeutic on 1200 units/hr. Hgb 11-12s, PLTc wnl. No issues with heparin infusion or signs of bleeding overnight per RN.  Goal of Therapy:  Heparin level 0.3-0.7 units/ml aPTT 66-102 Monitor platelets by anticoagulation protocol: Yes   Plan:  Increase heparin infusion to 1400 units/hr  Check anti-Xa level in 8 hours heparin level daily while correlating labs Monitor daily heparin level while correlating  Continue to monitor H&H and platelets   Ruben Im, PharmD Clinical Pharmacist 09/23/2023 10:45 PM Please check AMION for all [facility] numbers

## 2023-09-23 NOTE — Plan of Care (Signed)
  Problem: Clinical Measurements: Goal: Will remain free from infection Outcome: Progressing Goal: Respiratory complications will improve Outcome: Progressing Goal: Cardiovascular complication will be avoided Outcome: Progressing   Problem: Elimination: Goal: Will not experience complications related to bowel motility Outcome: Progressing Goal: Will not experience complications related to urinary retention Outcome: Progressing   Problem: Safety: Goal: Ability to remain free from injury will improve Outcome: Progressing   Problem: Skin Integrity: Goal: Risk for impaired skin integrity will decrease Outcome: Progressing   Problem: Cardiac: Goal: Ability to achieve and maintain adequate cardiopulmonary perfusion will improve Outcome: Progressing   Problem: Skin Integrity: Goal: Risk for impaired skin integrity will decrease Outcome: Progressing   Problem: Tissue Perfusion: Goal: Adequacy of tissue perfusion will improve Outcome: Progressing   Problem: Education: Goal: Knowledge of disease or condition will improve Outcome: Progressing Goal: Understanding of medication regimen will improve Outcome: Progressing   Problem: Cardiac: Goal: Ability to achieve and maintain adequate cardiopulmonary perfusion will improve Outcome: Progressing

## 2023-09-23 NOTE — Consult Note (Signed)
 Cardiology Consultation   Patient ID: [name] MRN: [id number]; DOB: 10/08/1945  Admit date: 09/22/2023 Date of Consult: 09/23/2023  PCP:  Theresa Bussing, MD    [facility]Care Providers Cardiologist:  Theresa Magic, MD   {  HPI   Theresa Mendoza is a 78 y.o. female with chronic afib (on apixaban), HFmrEF (EF 40-45%, 09/2020), hx NSTEMI with cath in 2016 without obstructive CAD, HTN, T2DM, COPD (not on home O2 at baseline, but has it available), RA, depression, anxiety,  who presented with chest pain. She is being seen 09/23/2023 for the evaluation of chest pain at the request of hospital medicine.  After review of multiple provider documentations, patient's history appears to be somewhat variable from provider-provider.  Patient initially developed acute substernal chest pain/heaviness with radiation down her left arm and associated diaphoresis and palpitations at 0200 on 3/8. She continued to lay in bed until the early afternoon. She took pain medications with mild improvement without complete resolution and thus presented to the [facility] ED. She reports this pain has been similar to her prior heart attacks, but also similar to her afib. She reports her symptoms for afib and heart attacks "go hand in hand"   She denied any fevers, chills, vomiting, abdominal pain.   ER Course:  VS: BP 120/47, HJR 43, RR 20, Sat 98%, T 97.109F Labs: K 3.9, mag 1.4, Cr 1.27 (baseline 0.8-1), GFR 44, WBC 12.9, Hgb 12.8, plt 311, trop 16 -> 15. TSH 2.045, A1c 7.6%, EKG: Afib with LBBB CXR: wnl Interventions: nitroglycerin (improvement of chest pain from 6 to 4), asa; recommended xfer to [facility]   PMH   Past Medical History:  Diagnosis Date   A-fib [facility])    Anxiety    Bipolar 1 disorder (HCC)    Bowel obstruction (HCC)    Cataract    Chronic atrial fibrillation (HCC) 09/2014   COPD (chronic obstructive pulmonary disease) (HCC)    Dyslipidemia, goal LDL below 70 12/25/2015   Encounter  for tobacco use cessation counseling 12/04/2017   Glaucoma    Hx of migraine headaches    Memory loss    Mitral regurgitation    mild to moderate on echo 09/2014   Old MI (myocardial infarction) 09/2014   cath with normal coronary arteries and EF 50-55%   Osteoarthritis    Pulmonary HTN (HCC) 09/2014   Mild with PASP at cath   RBBB    Rheumatoid arthritis (HCC)    Tobacco abuse     Past Surgical History:  Procedure Laterality Date   ADENOIDECTOMY     BACK SURGERY     bowel obstruction     CARDIAC CATHETERIZATION  09/2014   normal coronary arteries with mild pulmonary HTN, low normal LVF   CHOLECYSTECTOMY     NOSE SURGERY     TONSILLECTOMY     TRANSESOPHAGEAL ECHOCARDIOGRAM WITH CARDIOVERSION  09/2014   VAGINAL HYSTERECTOMY     with BSO       Home Medications:  Prior to Admission medications   Medication Sig Start Date End Date Taking? Authorizing Provider  apixaban (ELIQUIS) 5 MG TABS tablet Take 1 tablet (5 mg total) by mouth 2 (two) times daily. NEEDS CARDIOLOGY APPT FOR ELIQUIS REFILLS, PLEASE CALL OFFICE 01/19/23  Yes Mendoza, Theresa Bryant, MD  digoxin (LANOXIN) 0.125 MG tablet TAKE 1 TABLET BY MOUTH EVERY DAY 01/26/23  Yes Mendoza, Theresa Bryant, MD  diltiazem (CARDIZEM CD) 240 MG 24 hr capsule Take 240 mg  by mouth daily. 11/01/15  Yes [provider]  DULoxetine (CYMBALTA) 60 MG capsule TAKE 2 CAPSULES BY MOUTH EVERY DAY Patient taking differently: Take 120 mg by mouth daily. 10/09/19  Yes Mendoza, Theresa Hansen, MD  empagliflozin (JARDIANCE) 10 MG TABS tablet Take 1 tablet (10 mg total) by mouth daily before breakfast. 10/19/20  Yes Mendoza, Theresa Nearing, MD  furosemide (LASIX) 40 MG tablet Take 1 tablet (40 mg total) by mouth every other day. 10/18/21  Yes Theresa Hercules, MD  losartan (COZAAR) 25 MG tablet TAKE 1 TABLET (25 MG TOTAL) BY MOUTH DAILY. 04/13/22  Yes Mendoza, Theresa Bryant, MD  metFORMIN (GLUCOPHAGE) 500 MG tablet Take 1 tablet (500 mg total) by mouth 2 (two) times daily with a  meal. 10/15/21 09/22/23 Yes Theresa Hercules, MD  metoprolol succinate (TOPROL-XL) 25 MG 24 hr tablet Take 3 tablets (75 mg total) by mouth daily. 12/14/21  Yes Theresa Islam., NP  omeprazole (PRILOSEC) 40 MG capsule Take 40 mg by mouth in the morning. Before breakfast 09/07/20  Yes [provider]  pravastatin (PRAVACHOL) 40 MG tablet Take 1 tablet by mouth daily. 03/17/21  Yes [provider]  albuterol (VENTOLIN HFA) 108 (90 Base) MCG/ACT inhaler Inhale 2 puffs into the lungs every 6 (six) hours as needed for wheezing or shortness of breath. 10/15/21   Theresa Hercules, MD  doxycycline (MONODOX) 100 MG capsule Take 1 capsule (100 mg total) by mouth 2 (two) times daily. 10/15/21   Theresa Hercules, MD  HYDROcodone-acetaminophen (NORCO) 10-325 MG tablet Take 1 tablet by mouth 5 (five) times daily as needed for moderate pain or severe pain.    [provider]  umeclidinium-vilanterol (ANORO ELLIPTA) 62.5-25 MCG/ACT AEPB Inhale 1 puff into the lungs daily. 10/15/21   Theresa Hercules, MD    Inpatient Medications: Scheduled Meds:  DULoxetine  60 mg Oral Daily   losartan  25 mg Oral Daily   pantoprazole  40 mg Oral Daily   pravastatin  40 mg Oral Daily   umeclidinium-vilanterol  1 puff Inhalation Daily   Continuous Infusions:  heparin 1,000 Units/hr (09/22/23 2330)   PRN Meds: acetaminophen **OR** acetaminophen, albuterol, HYDROcodone-acetaminophen, nitroGLYCERIN, ondansetron **OR** ondansetron (ZOFRAN) IV  Allergies:    Allergies  Allergen Reactions   Lithium     Patient became suicidal   Tegretol [Carbamazepine]     UNK reaction    Social History:   Social History   Socioeconomic History   Marital status: Widowed    Spouse name: Not on file   Number of children: 1   Years of education: Not on file   Highest education level: High school graduate  Occupational History   Occupation: retired  Tobacco Use   Smoking status: Every Day    Current packs/day: 1.00    Average  packs/day: 1 pack/day for 60.0 years (60.0 ttl pk-yrs)    Types: Cigarettes   Smokeless tobacco: Never   Tobacco comments:    Has cut back to 1 pk a day from 3 a day.  Vaping Use   Vaping status: Never Used  Substance and Sexual Activity   Alcohol use: No    Alcohol/week: 0.0 standard drinks of alcohol   Drug use: No   Sexual activity: Never  Other Topics Concern   Not on file  Social History Narrative   ** Merged History Encounter **       Lives with son and daughter in law in a 2 story  home.  Has 1 son.  Retired from [employer].  Education: high school.  Widowed.   Social Drivers of Corporate investment banker Strain: Low Risk  (09/21/2023)   Received from [facility]   Overall Financial Resource Strain (CARDIA)    Difficulty of Paying Living Expenses: Not very hard  Food Insecurity: No Food Insecurity (09/22/2023)   Hunger Vital Sign    Worried About Running Out of Food in the Last Year: Never true    Ran Out of Food in the Last Year: Never true  Transportation Needs: No Transportation Needs (09/22/2023)   PRAPARE - Administrator, Civil Service (Medical): No    Lack of Transportation (Non-Medical): No  Physical Activity: Inactive (10/13/2020)   Exercise Vital Sign    Days of Exercise per Week: 0 days    Minutes of Exercise per Session: 0 min  Stress: Not on file  Social Connections: Unknown (11/14/2021)   Received from [facility]   Social Network    Social Network: Not on file  Intimate Partner Violence: Not At Risk (09/22/2023)   Humiliation, Afraid, Rape, and Kick questionnaire    Fear of Current or Ex-Partner: No    Emotionally Abused: No    Physically Abused: No    Sexually Abused: No    Family History:   Family History  Problem Relation Age of Onset   Tuberculosis Father    Depression Father    Alzheimer's disease Mother    Lung cancer Mother      ROS:  Please see the history of present illness.   All other ROS reviewed and negative.      Physical Exam/Data:   Vitals:   09/22/23 2250 09/22/23 2336 09/23/23 0409 09/23/23 0502  BP: (!) 129/51 (!) 141/79 (!) 158/71   Pulse: 73 64 63   Resp: 20 19 20 20   Temp:  97.7 F (36.5 C) 97.6 F (36.4 C)   TempSrc:  Oral Oral   SpO2: 99% 98% 99%   Weight:    71.8 kg  Height:    5\' 4"  (1.626 m)    Intake/Output Summary (Last 24 hours) at 09/23/2023 0620 Last data filed at 09/23/2023 0535 Gross per 24 hour  Intake 728.62 ml  Output 800 ml  Net -71.38 ml      09/23/2023    5:02 AM 09/22/2023    8:24 PM 09/22/2023   12:36 PM  Last 3 Weights  Weight (lbs) 158 lb 4.8 oz 157 lb 14.4 oz 165 lb  Weight (kg) 71.804 kg 71.623 kg 74.844 kg     Body mass index is 27.17 kg/m.  General:  Well nourished, well developed, in no acute distress HEENT: normal Neck: no JVD Vascular: No carotid bruits; Distal pulses 2+ bilaterally Cardiac:  irregular rhythm, normal rate, soft 2/6 SEM heard best at LUSB, Lungs:  clear to auscultation bilaterally, no wheezing, rhonchi or rales  Abd: soft, nontender, no hepatomegaly  Ext: no edema Musculoskeletal:  No deformities, BUE and BLE strength normal and equal Skin: warm and dry  Neuro:  no focal abnormalities noted Psych:  Normal affect   Relevant CV Studies: TTE 10/08/20  1. Left ventricular ejection fraction, by estimation, is 40 to 45%. The left ventricle has mildly decreased function. The left ventricle  demonstrates global hypokinesis. Left ventricular diastolic function could not be evaluated. There is global left ventricular hypokinesis, but there appears to be disproportionate hypokinesis of the inferior wall and inferior septum.  2. Right ventricular systolic function is moderately reduced. The right ventricular size is mildly enlarged. There is mildly elevated pulmonary artery systolic pressure.   3. Left atrial size was moderately dilated.   4. The mitral valve is normal in structure. Mild mitral valve regurgitation. No evidence of mitral  stenosis.   5. The aortic valve is tricuspid. There is moderate calcification of the aortic valve. There is moderate thickening of the aortic valve. Aortic valve regurgitation is mild to moderate. Mild to moderate aortic valve stenosis. Aortic valve mean gradient measures 14.5 mmHg. Aortic valve Vmax measures 2.46 m/s.   6. The inferior vena cava is dilated in size with <50% respiratory variability, suggesting right atrial pressure of 15 mmHg.   EKG 09/22/23 - Afib with RBBB, LVH with repolarization abnormality   EKG 10/13/21  Laboratory Data:  High Sensitivity Troponin:   Recent Labs  Lab 09/22/23 1257 09/22/23 1457  TROPONINIHS 16 15     Chemistry Recent Labs  Lab 09/22/23 1257 09/23/23 0342  NA 135 138  K 3.9 4.1  CL 98 103  CO2 25 25  GLUCOSE 125* 178*  BUN 25* 16  CREATININE 1.27* 1.11*  CALCIUM 8.8* 8.7*  MG  --  1.4*  GFRNONAA 44* 52*  ANIONGAP 12 10    No results for input(s): "PROT", "ALBUMIN", "AST", "ALT", "ALKPHOS", "BILITOT" in the last 168 hours. Lipids No results for input(s): "CHOL", "TRIG", "HDL", "LABVLDL", "LDLCALC", "CHOLHDL" in the last 168 hours.  Hematology Recent Labs  Lab 09/22/23 1257 09/23/23 0342  WBC 12.9* 11.1*  RBC 5.01 4.70  HGB 12.8 11.7*  HCT 40.1 37.9  MCV 80.0 80.6  MCH 25.5* 24.9*  MCHC 31.9 30.9  RDW 16.3* 16.0*  PLT 311 308   Thyroid  Recent Labs  Lab 09/22/23 2202  TSH 2.045    BNPNo results for input(s): "BNP", "PROBNP" in the last 168 hours.  DDimer  Recent Labs  Lab 09/22/23 2202  DDIMER 0.36     Radiology/Studies:  [facility] Chest Port 1 View Result Date: 09/22/2023 CLINICAL DATA:  [id number] Chest pain [id number] EXAM: PORTABLE CHEST 1 VIEW COMPARISON:  October 13, 2021 FINDINGS: The cardiomediastinal silhouette is unchanged in contour.Atherosclerotic calcifications of the aorta. Similar background of diffuse reticular prominence consistent with history of emphysema. No pleural effusion. No pneumothorax. No acute  pleuroparenchymal abnormality. Similar sclerotic focus of the proximal LEFT humeral head. IMPRESSION: No acute cardiopulmonary abnormality. Given underlying emphysema, recommend evaluation for candidacy for annual lung cancer screening CT. Electronically Signed   By: Meda Klinefelter M.D.   On: 09/22/2023 13:18     Assessment and Plan:    #HFmrEF #hx of NSTEMI; reported nonobstructive CAD Patient presenting with acute onset chest pain at rest with radiation down her left arm with associated diaphoresis and palpitations. She reports symptoms were similar to her prior MI, though per prior documentation she had a hx of NSTEMI with nonobstructive CAD and she does conflate afib/MI symptoms. EKG in Afib with LBBB, trop flat (16 -> 15), Mag 1.4, K 3.9. Given her history of afib with RVR and prior non-obstructive cath despite risk factors, suspect that her symptoms are related to her afib rather than ACS. --load asa 325mg  then 81mg  every day --continue heparin gtt (hold home apixaban) --continue pravastatin 40mg  every day --obtain TTE --continue losartan 25mg  every day --continue home jardiance 10mg  every day  --continue home lasix 20mg  every day  --will discuss ischemic work-up pending clinical course, TTE Risk stratification --A1c, TSH,  Lipid panel Monitoring --chest pain protocol; po nitroglycerin and EKG prn chest pain  #Afib with RVR --hold apixaban while on heparin gtt --check digoxin level --restart home digoxin every day --restart home diltiazem 240mg  every day --restart home metoprolol succinate 75mg  every day  --anticipate likely will benefit from zio on discharge --maintain telemetry --goal K~4, Mag~2   Risk Assessment/Risk Scores:  TIMI Risk Score for Unstable Angina or Non-ST Elevation MI:   The patient's TIMI risk score is 2, which indicates a 8% risk of all cause mortality, new or recurrent myocardial infarction or need for urgent revascularization in the next 14  days.  New York Heart Association (NYHA) Functional Class NYHA Class II  CHA2DS2-VASc Score = 7. Stroke risk was 11.2% per year in >90,000 patients (the [place] Atrial Fibrillation Cohort Study) and 15.7% risk of stroke/TIA/systemic embolism.  For questions or updates, please contact [facility] Please consult [email or web address] for contact info under    Signed, Rubye Oaks, MD  09/23/2023 6:20 AM

## 2023-09-23 NOTE — Progress Notes (Signed)
 PROGRESS NOTE    Theresa Mendoza  XBJ:[id number] DOB: 1945-07-31 DOA: 09/22/2023 PCP: [name], MD   Brief Narrative:   78 y.o. female with medical history significant of chronic atrial fibrillation on Eliquis, COPD, rheumatoid arthritis, chronic back pain, CAD, chronic systolic heart failure, hypertension, depression and anxiety, type 2 diabetes presented with chest pain.  On presentation, creatinine was 1.27, high-sensitivity troponins were 16 and 15.  She received nitroglycerin and aspirin.  Cardiology was consulted.  She was started on heparin drip.  Assessment & Plan:   Chest pain History of CAD -EKG nonischemic on presentation.  High-sensitivity troponins did not trend up.  Cardiology following.  Currently on heparin drip.  Continue statin.  As needed nitroglycerin.  Chronic atrial fibrillation -Currently rate controlled.  Home Toprol-XL and Cardizem on hold for now.  Eliquis on hold.  On heparin drip  Chronic systolic heart failure Hypertension Hyperlipidemia -Strict input and output.  Daily weight.  Fluid restriction.  Follow cardiology recommendations.  Continue losartan.  Lasix, Jardiance, digoxin currently on hold. -Continue statin  AKI -Baseline creatinine around 0.8.  Presented with creatinine of 1.27.  Creatinine improving to 1.11 this morning.  Received IV fluids in the ED.  Monitor.  Leukocytosis -Improving  Hypomagnesemia -Replace.  Repeat a.m. labs  Diabetes mellitus type 2 with hyperglycemia -A1c 7.6.  Start CBGs with SSI.  Metformin and Jardiance on hold  COPD -Stable.  Reports quitting smoking about a year ago.  Continue current inhaled regimen  GERD -Continue Protonix  Anxiety and depression -Continue Cymbalta  Chronic back pain -Continue Norco as needed   DVT prophylaxis: Heparin drip Code Status: DNR Family Communication: None at bedside Disposition Plan: Status is: Inpatient Remains inpatient appropriate because: Of severity of  illness   Consultants: Cardiology  Procedures: None  Antimicrobials: None   Subjective: Patient seen and examined at bedside.  Feels little better.  Denies any current chest pain.  Complains of back pain.  No nausea, fever or vomiting reported.  Objective: Vitals:   09/22/23 2250 09/22/23 2336 09/23/23 0409 09/23/23 0502  BP: (!) 129/51 (!) 141/79 (!) 158/71   Pulse: 73 64 63   Resp: 20 19 20 20   Temp:  97.7 F (36.5 C) 97.6 F (36.4 C)   TempSrc:  Oral Oral   SpO2: 99% 98% 99%   Weight:    71.8 kg  Height:    5\' 4"  (1.626 m)    Intake/Output Summary (Last 24 hours) at 09/23/2023 0715 Last data filed at 09/23/2023 0535 Gross per 24 hour  Intake 728.62 ml  Output 800 ml  Net -71.38 ml   Filed Weights   09/22/23 1236 09/22/23 2024 09/23/23 0502  Weight: 74.8 kg 71.6 kg 71.8 kg    Examination:  General exam: Appears calm and comfortable.  On room air. Respiratory system: Bilateral decreased breath sounds at bases Cardiovascular system: S1 & S2 heard, Rate controlled Gastrointestinal system: Abdomen is nondistended, soft and nontender. Normal bowel sounds heard. Extremities: No cyanosis, clubbing, edema  Central nervous system: Alert and oriented. No focal neurological deficits. Moving extremities Skin: No rashes, lesions or ulcers Psychiatry: Flat affect.  Not agitated.     Data Reviewed: I have personally reviewed following labs and imaging studies  CBC: Recent Labs  Lab 09/22/23 1257 09/23/23 0342  WBC 12.9* 11.1*  HGB 12.8 11.7*  HCT 40.1 37.9  MCV 80.0 80.6  PLT 311 308   Basic Metabolic Panel: Recent Labs  Lab 09/22/23 1257 09/23/23  0342  NA 135 138  K 3.9 4.1  CL 98 103  CO2 25 25  GLUCOSE 125* 178*  BUN 25* 16  CREATININE 1.27* 1.11*  CALCIUM 8.8* 8.7*  MG  --  1.4*   GFR: Estimated Creatinine Clearance: 41.9 mL/min (A) (by C-G formula based on SCr of 1.11 mg/dL (H)). Liver Function Tests: No results for input(s): "AST", "ALT",  "ALKPHOS", "BILITOT", "PROT", "ALBUMIN" in the last 168 hours. No results for input(s): "LIPASE", "AMYLASE" in the last 168 hours. No results for input(s): "AMMONIA" in the last 168 hours. Coagulation Profile: No results for input(s): "INR", "PROTIME" in the last 168 hours. Cardiac Enzymes: No results for input(s): "CKTOTAL", "CKMB", "CKMBINDEX", "TROPONINI" in the last 168 hours. BNP (last 3 results) No results for input(s): "PROBNP" in the last 8760 hours. HbA1C: Recent Labs    09/22/23 2202  HGBA1C 7.6*   CBG: Recent Labs  Lab 09/22/23 2202 09/23/23 0612  GLUCAP 206* 126*   Lipid Profile: No results for input(s): "CHOL", "HDL", "LDLCALC", "TRIG", "CHOLHDL", "LDLDIRECT" in the last 72 hours. Thyroid Function Tests: Recent Labs    09/22/23 2202  TSH 2.045   Anemia Panel: No results for input(s): "VITAMINB12", "FOLATE", "FERRITIN", "TIBC", "IRON", "RETICCTPCT" in the last 72 hours. Sepsis Labs: No results for input(s): "PROCALCITON", "LATICACIDVEN" in the last 168 hours.  No results found for this or any previous visit (from the past 240 hours).       Radiology Studies: DG Chest Port 1 View Result Date: 09/22/2023 CLINICAL DATA:  [id number] Chest pain [id number] EXAM: PORTABLE CHEST 1 VIEW COMPARISON:  October 13, 2021 FINDINGS: The cardiomediastinal silhouette is unchanged in contour.Atherosclerotic calcifications of the aorta. Similar background of diffuse reticular prominence consistent with history of emphysema. No pleural effusion. No pneumothorax. No acute pleuroparenchymal abnormality. Similar sclerotic focus of the proximal LEFT humeral head. IMPRESSION: No acute cardiopulmonary abnormality. Given underlying emphysema, recommend evaluation for candidacy for annual lung cancer screening CT. Electronically Signed   By: Meda Klinefelter M.D.   On: 09/22/2023 13:18        Scheduled Meds:  DULoxetine  60 mg Oral Daily   losartan  25 mg Oral Daily   pantoprazole  40 mg  Oral Daily   pravastatin  40 mg Oral Daily   umeclidinium-vilanterol  1 puff Inhalation Daily   Continuous Infusions:  heparin 1,000 Units/hr (09/22/23 2330)          Glade Lloyd, MD [facility] 09/23/2023, 7:15 AM

## 2023-09-23 NOTE — Progress Notes (Addendum)
 PHARMACY - ANTICOAGULATION CONSULT NOTE  Pharmacy Consult for heparin  Indication: chest pain/ACS , hx of afib   Allergies  Allergen Reactions   Lithium     Patient became suicidal   Tegretol [Carbamazepine]     UNK reaction    Patient Measurements: Height: 5\' 4"  (162.6 cm) Weight: 71.8 kg (158 lb 4.8 oz) IBW/kg (Calculated) : 54.7 Heparin Dosing Weight: 70.3kg   Vital Signs: Temp: 98 F (36.7 C) (03/09 0730) Temp Source: Oral (03/09 0730) BP: 160/70 (03/09 0730) Pulse Rate: 68 (03/09 0730)  Labs: Recent Labs    09/22/23 1257 09/22/23 1457 09/23/23 0342 09/23/23 1102  HGB 12.8  --  11.7*  --   HCT 40.1  --  37.9  --   PLT 311  --  308  --   APTT  --   --  37* 45*  HEPARINUNFRC  --   --  >1.10*  --   CREATININE 1.27*  --  1.11*  --   TROPONINIHS 16 15  --   --     Estimated Creatinine Clearance: 41.9 mL/min (A) (by C-G formula based on SCr of 1.11 mg/dL (H)).   Medical History: Past Medical History:  Diagnosis Date   A-fib (HCC)    Anxiety    Bipolar 1 disorder (HCC)    Bowel obstruction (HCC)    Cataract    Chronic atrial fibrillation (HCC) 09/2014   COPD (chronic obstructive pulmonary disease) (HCC)    Dyslipidemia, goal LDL below 70 12/25/2015   Encounter for tobacco use cessation counseling 12/04/2017   Glaucoma    Hx of migraine headaches    Memory loss    Mitral regurgitation    mild to moderate on echo 09/2014   Old MI (myocardial infarction) 09/2014   cath with normal coronary arteries and EF 50-55%   Osteoarthritis    Pulmonary HTN (HCC) 09/2014   Mild with PASP at cath   RBBB    Rheumatoid arthritis (HCC)    Tobacco abuse     Assessment: Patient presenting with CC of SOB, nausea, reporting the same feeling as when she had a heart attack. Patient with PMH of afib on Eliquis PTA, last dose 11:30 am, trop 16. Hemoglobin 12.8 and PLTs 311. Cardiology consulted and planned admission. Pharmacy consulted to dose heparin. No bolus with  recent DOAC intake.   aPTT (45) subtherapeutic on 1000 units/hr. Hgb 11-12s, PLTc wnl. No issues with heparin infusion or signs of bleeding overnight per RN.  Goal of Therapy:  Heparin level 0.3-0.7 units/ml aPTT 66-102 Monitor platelets by anticoagulation protocol: Yes   Plan:  Increase heparin infusion to 1200 units/hr  Check anti-Xa level in 8 hours heparin level daily while correlating labs Monitor daily heparin level while correlating  Continue to monitor H&H and platelets   Verdene Rio, PharmD PGY1 Pharmacy Resident

## 2023-09-24 ENCOUNTER — Telehealth: Payer: Self-pay | Admitting: Cardiology

## 2023-09-24 ENCOUNTER — Inpatient Hospital Stay (HOSPITAL_COMMUNITY)

## 2023-09-24 DIAGNOSIS — R079 Chest pain, unspecified: Secondary | ICD-10-CM

## 2023-09-24 DIAGNOSIS — I5020 Unspecified systolic (congestive) heart failure: Secondary | ICD-10-CM

## 2023-09-24 DIAGNOSIS — I4891 Unspecified atrial fibrillation: Secondary | ICD-10-CM | POA: Diagnosis not present

## 2023-09-24 LAB — BASIC METABOLIC PANEL
Anion gap: 8 (ref 5–15)
BUN: 15 mg/dL (ref 8–23)
CO2: 25 mmol/L (ref 22–32)
Calcium: 9 mg/dL (ref 8.9–10.3)
Chloride: 107 mmol/L (ref 98–111)
Creatinine, Ser: 1.3 mg/dL — ABNORMAL HIGH (ref 0.44–1.00)
GFR, Estimated: 43 mL/min — ABNORMAL LOW (ref >60)
Glucose, Bld: 325 mg/dL — ABNORMAL HIGH (ref 70–99)
Potassium: 4.3 mmol/L (ref 3.5–5.1)
Sodium: 140 mmol/L (ref 135–145)

## 2023-09-24 LAB — HEPARIN LEVEL (UNFRACTIONATED): Heparin Unfractionated: >1.1 [IU]/mL — ABNORMAL HIGH (ref 0.30–0.70)

## 2023-09-24 LAB — ECHOCARDIOGRAM COMPLETE
AR max vel: 1.21 cm2
AV Area VTI: 1.36 cm2
AV Area mean vel: 1.35 cm2
AV Mean grad: 33 mmHg
AV Peak grad: 51.2 mmHg
Ao pk vel: 3.58 m/s
Area-P 1/2: 3.71 cm2
Height: 64 in
MV VTI: 2.46 cm2
S' Lateral: 2.4 cm
Weight: 2529.6 [oz_av]

## 2023-09-24 LAB — CBC
HCT: 37.7 % (ref 36.0–46.0)
Hemoglobin: 11.8 g/dL — ABNORMAL LOW (ref 12.0–15.0)
MCH: 25.4 pg — ABNORMAL LOW (ref 26.0–34.0)
MCHC: 31.3 g/dL (ref 30.0–36.0)
MCV: 81.1 fL (ref 80.0–100.0)
Platelets: 275 10*3/uL (ref 150–400)
RBC: 4.65 MIL/uL (ref 3.87–5.11)
RDW: 16.3 % — ABNORMAL HIGH (ref 11.5–15.5)
WBC: 8.9 10*3/uL (ref 4.0–10.5)
nRBC: 0 % (ref 0.0–0.2)

## 2023-09-24 LAB — MAGNESIUM: Magnesium: 1.9 mg/dL (ref 1.7–2.4)

## 2023-09-24 LAB — GLUCOSE, CAPILLARY
Glucose-Capillary: 140 mg/dL — ABNORMAL HIGH (ref 70–99)
Glucose-Capillary: 125 mg/dL — ABNORMAL HIGH (ref 70–99)
Glucose-Capillary: 235 mg/dL — ABNORMAL HIGH (ref 70–99)

## 2023-09-24 LAB — APTT: aPTT: 89 s — ABNORMAL HIGH (ref 24–36)

## 2023-09-24 MED ORDER — EMPAGLIFLOZIN 10 MG PO TABS: 10.0000 mg | ORAL_TABLET | Freq: Every day | ORAL | Status: DC

## 2023-09-24 MED ORDER — NITROGLYCERIN 0.4 MG SL SUBL: 0.4000 mg | SUBLINGUAL_TABLET | SUBLINGUAL | 0 refills | Status: AC | PRN

## 2023-09-24 MED ORDER — DIGOXIN 125 MCG PO TABS
125.0000 ug | ORAL_TABLET | Freq: Every day | ORAL | Status: DC
  Administered 2023-09-24: 125 ug via ORAL
  Filled 2023-09-24: qty 1

## 2023-09-24 MED ORDER — FUROSEMIDE 40 MG PO TABS: 20.0000 mg | ORAL_TABLET | ORAL | Status: DC

## 2023-09-24 MED ORDER — ALBUTEROL SULFATE HFA 108 (90 BASE) MCG/ACT IN AERS: 2.0000 | INHALATION_SPRAY | RESPIRATORY_TRACT | 0 refills | Status: DC | PRN

## 2023-09-24 MED ORDER — DILTIAZEM HCL ER COATED BEADS 240 MG PO CP24
240.0000 mg | ORAL_CAPSULE | Freq: Every day | ORAL | Status: DC
  Administered 2023-09-24: 240 mg via ORAL
  Filled 2023-09-24: qty 1

## 2023-09-24 MED ORDER — UMECLIDINIUM-VILANTEROL 62.5-25 MCG/ACT IN AEPB: 1.0000 | INHALATION_SPRAY | Freq: Every day | RESPIRATORY_TRACT | 0 refills | Status: DC

## 2023-09-24 MED ORDER — METOPROLOL SUCCINATE ER 50 MG PO TB24
75.0000 mg | ORAL_TABLET | Freq: Every day | ORAL | Status: DC
  Administered 2023-09-24: 75 mg via ORAL
  Filled 2023-09-24: qty 1

## 2023-09-24 NOTE — Telephone Encounter (Addendum)
 Pt is still an inpatient....order has been placed by the APP and I will send instructions to her My Chart.   Need to call the pt to go over her instructions after she is d/c from the hosp.

## 2023-09-24 NOTE — TOC CM/SW Note (Signed)
 Transition of Care [facility]) - Inpatient Brief Assessment   Patient Details  Name: Theresa Mendoza MRN: [id number] Date of Birth: 1945-08-25  [facility]) CM/SW Contact:    Leone Haven, RN Phone Number: 09/24/2023, 12:37 PM   Clinical Narrative: From home with son, indep has PCP and insurance on file, states has no HH services in place at this time , has home oxygen that she bought on her own at home.  States family member will transport them home at [employer] and family is support system, states gets medications from [facility] on [address].  Pta self ambulatory.   Transition of Care Asessment: Insurance and Status: Insurance coverage has been reviewed Patient has primary care physician: Yes Home environment has been reviewed: home with son and his wife Prior level of function:: indep Prior/Current Home Services: Current home services (home oxygen at night only) Social Drivers of Health Review: SDOH reviewed no interventions necessary Readmission risk has been reviewed: Yes Transition of care needs: no transition of care needs at this time

## 2023-09-24 NOTE — TOC Transition Note (Signed)
 Transition of Care [facility]) - Discharge Note   Patient Details  Name: Theresa Mendoza MRN: [id number] Date of Birth: 31-May-1946  Transition of Care [facility]) CM/SW Contact:  Leone Haven, RN Phone Number: 09/24/2023, 12:39 PM   Clinical Narrative:     For possible dc today, has no needs. Has transport at dc.        Patient Goals and CMS Choice            Discharge Placement                       Discharge Plan and Services Additional resources added to the After Visit Summary for                                       Social Drivers of Health (SDOH) Interventions SDOH Screenings   Food Insecurity: No Food Insecurity (09/22/2023)  Housing: Low Risk  (09/22/2023)  Transportation Needs: No Transportation Needs (09/22/2023)  Utilities: Not At Risk (09/22/2023)  Alcohol Screen: Low Risk  (10/13/2020)  Financial Resource Strain: Low Risk  (09/21/2023)   Received from [facility]  Physical Activity: Inactive (10/13/2020)  Social Connections: Patient Declined (09/23/2023)  Tobacco Use: High Risk (09/22/2023)     Readmission Risk Interventions    09/24/2023   12:34 PM  Readmission Risk Prevention Plan  Post Dischage Appt Complete  Medication Screening Complete  Transportation Screening Complete

## 2023-09-24 NOTE — Progress Notes (Signed)
 PROGRESS NOTE    Theresa Mendoza  NWG:[id number] DOB: 03-Sep-1945 DOA: 09/22/2023 PCP: [name], MD   Brief Narrative:   78 y.o. female with medical history significant of chronic atrial fibrillation on Eliquis, COPD, rheumatoid arthritis, chronic back pain, CAD, chronic systolic heart failure, hypertension, depression and anxiety, type 2 diabetes presented with chest pain.  On presentation, creatinine was 1.27, high-sensitivity troponins were 16 and 15.  She received nitroglycerin and aspirin.  Cardiology was consulted.  She was started on heparin drip.  Assessment & Plan:   Chest pain History of CAD -EKG nonischemic on presentation.  High-sensitivity troponins did not trend up.  Cardiology following.  Currently on heparin drip.  Continue statin.  As needed nitroglycerin. -Currently chest pain-free  Chronic atrial fibrillation -Currently rate controlled mostly.  Eliquis on hold.  On heparin drip  Chronic systolic heart failure Hypertension Hyperlipidemia -Strict input and output.  Daily weight.  Fluid restriction.  Follow cardiology recommendations.  Continue losartan.  Lasix, Jardiance, digoxin currently on hold. -Continue statin  AKI -Baseline creatinine around 0.8.  Presented with creatinine of 1.27.  Creatinine 1.3 this morning.  Received IV fluids in the ED.  Monitor.  Leukocytosis -Resolved  Hypomagnesemia -Improved  Diabetes mellitus type 2 with hyperglycemia -A1c 7.6.  Start CBGs with SSI.  Metformin and Jardiance on hold  COPD -Stable.  Reports quitting smoking about a year ago.  Continue current inhaled regimen  GERD -Continue Protonix  Anxiety and depression -Continue Cymbalta  Chronic back pain -Continue Norco as needed   DVT prophylaxis: Heparin drip Code Status: DNR Family Communication: None at bedside Disposition Plan: Status is: Inpatient Remains inpatient appropriate because: Of severity of illness   Consultants:  Cardiology  Procedures: None  Antimicrobials: None   Subjective: Patient seen and examined at bedside.  No chest pain.  No fever, vomiting, shortness of breath reported. Objective: Vitals:   09/24/23 0446 09/24/23 0740 09/24/23 0841 09/24/23 0855  BP: (!) 166/60 (!) 136/110  (!) 162/110  Pulse: 78 79    Resp: 18 18    Temp: 98.1 F (36.7 C) 97.9 F (36.6 C)    TempSrc: Oral Oral    SpO2: 93% 97% 97%   Weight: 71.7 kg     Height:        Intake/Output Summary (Last 24 hours) at 09/24/2023 1151 Last data filed at 09/24/2023 0834 Gross per 24 hour  Intake 930.31 ml  Output 1200 ml  Net -269.69 ml   Filed Weights   09/22/23 2024 09/23/23 0502 09/24/23 0446  Weight: 71.6 kg 71.8 kg 71.7 kg    Examination:  General: On room air.  No distress ENT/neck: No thyromegaly.  JVD is not elevated  respiratory: Decreased breath sounds at bases bilaterally with some crackles; no wheezing  CVS: S1-S2 heard, rate controlled currently Abdominal: Soft, nontender, slightly distended; no organomegaly, bowel sounds are heard Extremities: Trace lower extremity edema; no cyanosis  CNS: Awake and alert.  Slow to respond.  No focal neurologic deficit.  Moves extremities Lymph: No obvious lymphadenopathy Skin: No obvious ecchymosis/lesions  psych: Flat affect.  Not agitated. musculoskeletal: No obvious joint swelling/deformity     Data Reviewed: I have personally reviewed following labs and imaging studies  CBC: Recent Labs  Lab 09/22/23 1257 09/23/23 0342 09/24/23 0901  WBC 12.9* 11.1* 8.9  HGB 12.8 11.7* 11.8*  HCT 40.1 37.9 37.7  MCV 80.0 80.6 81.1  PLT 311 308 275   Basic Metabolic Panel: Recent Labs  Lab 09/22/23 1257 09/23/23 0342 09/24/23 0901  NA 135 138 140  K 3.9 4.1 4.3  CL 98 103 107  CO2 25 25 25   GLUCOSE 125* 178* 325*  BUN 25* 16 15  CREATININE 1.27* 1.11* 1.30*  CALCIUM 8.8* 8.7* 9.0  MG  --  1.4* 1.9   GFR: Estimated Creatinine Clearance: 35.7  mL/min (A) (by C-G formula based on SCr of 1.3 mg/dL (H)). Liver Function Tests: No results for input(s): "AST", "ALT", "ALKPHOS", "BILITOT", "PROT", "ALBUMIN" in the last 168 hours. No results for input(s): "LIPASE", "AMYLASE" in the last 168 hours. No results for input(s): "AMMONIA" in the last 168 hours. Coagulation Profile: No results for input(s): "INR", "PROTIME" in the last 168 hours. Cardiac Enzymes: No results for input(s): "CKTOTAL", "CKMB", "CKMBINDEX", "TROPONINI" in the last 168 hours. BNP (last 3 results) No results for input(s): "PROBNP" in the last 8760 hours. HbA1C: Recent Labs    09/22/23 2202  HGBA1C 7.6*   CBG: Recent Labs  Lab 09/23/23 1118 09/23/23 1545 09/23/23 2117 09/24/23 0604 09/24/23 1135  GLUCAP 137* 130* 119* 140* 125*   Lipid Profile: No results for input(s): "CHOL", "HDL", "LDLCALC", "TRIG", "CHOLHDL", "LDLDIRECT" in the last 72 hours. Thyroid Function Tests: Recent Labs    09/22/23 2202  TSH 2.045   Anemia Panel: No results for input(s): "VITAMINB12", "FOLATE", "FERRITIN", "TIBC", "IRON", "RETICCTPCT" in the last 72 hours. Sepsis Labs: No results for input(s): "PROCALCITON", "LATICACIDVEN" in the last 168 hours.  No results found for this or any previous visit (from the past 240 hours).       Radiology Studies: US RENAL Result Date: 09/23/2023 CLINICAL DATA:  [id number] AKI (acute kidney injury) (HCC) [id number] EXAM: RENAL / URINARY TRACT ULTRASOUND COMPLETE COMPARISON:  None Available. FINDINGS: Right Kidney: Renal measurements: 12.6 x 5.6 x 6.8 cm = volume: 250 mL. Echogenicity within the upper limits of normal. No hydronephrosis visualized. 2.2 cm oval circumscribed anechoic mass with posterior acoustic enhancement most consistent with a benign cyst (for which no dedicated imaging follow-up is recommended). Left Kidney: Renal measurements: 11.3 x 5.4 x 5.6 cm = volume: 179 mL. Echogenicity within the upper limits of normal. No mass or  hydronephrosis visualized. Portions are suboptimally assessed due to shadowing bowel gas. Bladder: Appears normal for degree of bladder distention. Other: None. IMPRESSION: No hydronephrosis. Electronically Signed   By: Meda Klinefelter M.D.   On: 09/23/2023 09:52   DG Chest Port 1 View Result Date: 09/22/2023 CLINICAL DATA:  [id number] Chest pain [id number] EXAM: PORTABLE CHEST 1 VIEW COMPARISON:  October 13, 2021 FINDINGS: The cardiomediastinal silhouette is unchanged in contour.Atherosclerotic calcifications of the aorta. Similar background of diffuse reticular prominence consistent with history of emphysema. No pleural effusion. No pneumothorax. No acute pleuroparenchymal abnormality. Similar sclerotic focus of the proximal LEFT humeral head. IMPRESSION: No acute cardiopulmonary abnormality. Given underlying emphysema, recommend evaluation for candidacy for annual lung cancer screening CT. Electronically Signed   By: Meda Klinefelter M.D.   On: 09/22/2023 13:18        Scheduled Meds:  DULoxetine  60 mg Oral Daily   losartan  25 mg Oral Daily   pantoprazole  40 mg Oral Daily   pravastatin  40 mg Oral Daily   umeclidinium-vilanterol  1 puff Inhalation Daily   Continuous Infusions:  heparin 1,400 Units/hr (09/23/23 2257)          Glade Lloyd, MD [facility] 09/24/2023, 11:51 AM

## 2023-09-24 NOTE — Progress Notes (Signed)
 Rounding Note    Patient Name: Theresa Mendoza Date of Encounter: 09/24/2023  [facility] Cardiologist: Armanda Magic, MD   Subjective   She notes that her chest pain has subsided.   Inpatient Medications    Scheduled Meds:  DULoxetine  60 mg Oral Daily   losartan  25 mg Oral Daily   pantoprazole  40 mg Oral Daily   pravastatin  40 mg Oral Daily   umeclidinium-vilanterol  1 puff Inhalation Daily   Continuous Infusions:  heparin 1,400 Units/hr (09/23/23 2257)   PRN Meds: acetaminophen **OR** acetaminophen, albuterol, HYDROcodone-acetaminophen, melatonin, nitroGLYCERIN, ondansetron **OR** ondansetron (ZOFRAN) IV   Vital Signs    Vitals:   09/24/23 0446 09/24/23 0740 09/24/23 0841 09/24/23 0855  BP: (!) 166/60 (!) 136/110  (!) 162/110  Pulse: 78 79    Resp: 18 18    Temp: 98.1 F (36.7 C) 97.9 F (36.6 C)    TempSrc: Oral Oral    SpO2: 93% 97% 97%   Weight: 71.7 kg     Height:        Intake/Output Summary (Last 24 hours) at 09/24/2023 1017 Last data filed at 09/24/2023 0834 Gross per 24 hour  Intake 930.31 ml  Output 1500 ml  Net -569.69 ml      09/24/2023    4:46 AM 09/23/2023    5:02 AM 09/22/2023    8:24 PM  Last 3 Weights  Weight (lbs) 158 lb 1.6 oz 158 lb 4.8 oz 157 lb 14.4 oz  Weight (kg) 71.714 kg 71.804 kg 71.623 kg      Telemetry    Afib 1teens - Personally Reviewed  ECG    No new - Personally Reviewed  Physical Exam   Vitals:   09/24/23 0841 09/24/23 0855  BP:  (!) 162/110  Pulse:    Resp:    Temp:    SpO2: 97%     GEN: No acute distress.   Neck: No JVD Cardiac: IRRR, no murmurs, rubs, or gallops.  Respiratory: Clear to auscultation bilaterally. GI: Soft, nontender, non-distended  MS: No edema; No deformity. Neuro:  Nonfocal  Psych: Normal affect   Labs    High Sensitivity Troponin:   Recent Labs  Lab 09/22/23 1257 09/22/23 1457  TROPONINIHS 16 15     Chemistry Recent Labs  Lab 09/22/23 1257  09/23/23 0342 09/24/23 0901  NA 135 138 140  K 3.9 4.1 4.3  CL 98 103 107  CO2 25 25 25   GLUCOSE 125* 178* 325*  BUN 25* 16 15  CREATININE 1.27* 1.11* 1.30*  CALCIUM 8.8* 8.7* 9.0  MG  --  1.4* 1.9  GFRNONAA 44* 52* 43*  ANIONGAP 12 10 8     Lipids No results for input(s): "CHOL", "TRIG", "HDL", "LABVLDL", "LDLCALC", "CHOLHDL" in the last 168 hours.  Hematology Recent Labs  Lab 09/22/23 1257 09/23/23 0342 09/24/23 0901  WBC 12.9* 11.1* 8.9  RBC 5.01 4.70 4.65  HGB 12.8 11.7* 11.8*  HCT 40.1 37.9 37.7  MCV 80.0 80.6 81.1  MCH 25.5* 24.9* 25.4*  MCHC 31.9 30.9 31.3  RDW 16.3* 16.0* 16.3*  PLT 311 308 275   Thyroid  Recent Labs  Lab 09/22/23 2202  TSH 2.045    BNPNo results for input(s): "BNP", "PROBNP" in the last 168 hours.  DDimer  Recent Labs  Lab 09/22/23 2202  DDIMER 0.36     Radiology    US RENAL Result Date: 09/23/2023 CLINICAL DATA:  [id number] AKI (acute kidney  injury) (HCC) [id number] EXAM: RENAL / URINARY TRACT ULTRASOUND COMPLETE COMPARISON:  None Available. FINDINGS: Right Kidney: Renal measurements: 12.6 x 5.6 x 6.8 cm = volume: 250 mL. Echogenicity within the upper limits of normal. No hydronephrosis visualized. 2.2 cm oval circumscribed anechoic mass with posterior acoustic enhancement most consistent with a benign cyst (for which no dedicated imaging follow-up is recommended). Left Kidney: Renal measurements: 11.3 x 5.4 x 5.6 cm = volume: 179 mL. Echogenicity within the upper limits of normal. No mass or hydronephrosis visualized. Portions are suboptimally assessed due to shadowing bowel gas. Bladder: Appears normal for degree of bladder distention. Other: None. IMPRESSION: No hydronephrosis. Electronically Signed   By: Meda Klinefelter M.D.   On: 09/23/2023 09:52   DG Chest Port 1 View Result Date: 09/22/2023 CLINICAL DATA:  [id number] Chest pain [id number] EXAM: PORTABLE CHEST 1 VIEW COMPARISON:  October 13, 2021 FINDINGS: The cardiomediastinal silhouette is  unchanged in contour.Atherosclerotic calcifications of the aorta. Similar background of diffuse reticular prominence consistent with history of emphysema. No pleural effusion. No pneumothorax. No acute pleuroparenchymal abnormality. Similar sclerotic focus of the proximal LEFT humeral head. IMPRESSION: No acute cardiopulmonary abnormality. Given underlying emphysema, recommend evaluation for candidacy for annual lung cancer screening CT. Electronically Signed   By: Meda Klinefelter M.D.   On: 09/22/2023 13:18    Cardiac Studies   TTE 10/08/2020 1. Left ventricular ejection fraction, by estimation, is 40 to 45%. The  left ventricle has mildly decreased function. The left ventricle  demonstrates global hypokinesis. Left ventricular diastolic function could  not be evaluated. There is global left  ventricular hypokinesis, but there appears to be disproportionate  hypokinesis of the inferior wall and inferior septum.   2. Right ventricular systolic function is moderately reduced. The right  ventricular size is mildly enlarged. There is mildly elevated pulmonary  artery systolic pressure.   3. Left atrial size was moderately dilated.   4. The mitral valve is normal in structure. Mild mitral valve  regurgitation. No evidence of mitral stenosis.   5. The aortic valve is tricuspid. There is moderate calcification of the  aortic valve. There is moderate thickening of the aortic valve. Aortic  valve regurgitation is mild to moderate. Mild to moderate aortic valve  stenosis. Aortic valve mean gradient  measures 14.5 mmHg. Aortic valve Vmax measures 2.46 m/s.   6. The inferior vena cava is dilated in size with <50% respiratory  variability, suggesting right atrial pressure of 15 mmHg.   Conclusion(s)/Recommendation(s): Atrial fibrillation throughout the study.   Patient Profile     Theresa Mendoza is a 78 y.o. female with chronic afib (on apixaban), HFmrEF (EF 40-45%, 09/2020), hx NSTEMI with  cath in 2016 without obstructive CAD, HTN, T2DM, COPD (not on home O2 at baseline, but has it available), RA, depression, anxiety,  who presented with chest pain. She is being seen 09/23/2023 for the evaluation of chest pain at the request of hospital medicine.   Assessment & Plan    HFmrEF Patient presenting with acute onset chest pain at rest with radiation down her left arm with associated diaphoresis and palpitations. She reports symptoms were similar to her prior MI, though per prior documentation she had a hx of NSTEMI with nonobstructive CAD and she does conflate afib/MI symptoms. EKG in Afib with RBBB, trop flat (16 -> 15) -She has no signs of ischemia based on her troponins and EKG.  However she noted symptoms improved significantly with nitro.  Will plan for an  echocardiogram and if no significant changes we will plan for an outpatient Lexiscan with close follow-up - cont heparin gtt - continue statin --continue losartan 25mg  every day --continue home jardiance 10mg  every day  --continue home lasix 20mg  every day - on BB -Nitro as needed   Afib with RVR Rates in the 1 teens. -Continue heparin drip; holding home apixaban -Continue home digoxin 0.125 mcg every day -Continue home diltiazem 240 mg daily -Continue home beta-blocker   HTN Blood pressure fluctuates.  Continue medications per above  For questions or updates, please contact [facility] Please consult [email or web address] for contact info under        Signed, Maisie Fus, MD  09/24/2023, 10:17 AM

## 2023-09-24 NOTE — Progress Notes (Signed)
 PHARMACY - ANTICOAGULATION CONSULT NOTE  Pharmacy Consult for heparin  Indication: chest pain/ACS , hx of afib   Allergies  Allergen Reactions   Lithium     Patient became suicidal   Tegretol [Carbamazepine] Other (See Comments)    UNK reaction    Patient Measurements: Height: 5\' 4"  (162.6 cm) Weight: 71.7 kg (158 lb 1.6 oz) IBW/kg (Calculated) : 54.7 Heparin Dosing Weight: 70.3kg   Vital Signs: Temp: 97.9 F (36.6 C) (03/10 0740) Temp Source: Oral (03/10 0740) BP: 162/110 (03/10 0855) Pulse Rate: 79 (03/10 0740)  Labs: Recent Labs    09/22/23 1257 09/22/23 1257 09/22/23 1457 09/23/23 0342 09/23/23 1102 09/23/23 2128 09/24/23 0901  HGB 12.8  --   --  11.7*  --   --  11.8*  HCT 40.1  --   --  37.9  --   --  37.7  PLT 311  --   --  308  --   --  275  APTT  --    < >  --  37* 45* 52* 89*  HEPARINUNFRC  --   --   --  >1.10*  --   --  >1.10*  CREATININE 1.27*  --   --  1.11*  --   --  1.30*  TROPONINIHS 16  --  15  --   --   --   --    < > = values in this interval not displayed.    Estimated Creatinine Clearance: 35.7 mL/min (A) (by C-G formula based on SCr of 1.3 mg/dL (H)).   Medical History: Past Medical History:  Diagnosis Date   A-fib (HCC)    Anxiety    Bipolar 1 disorder (HCC)    Bowel obstruction (HCC)    Cataract    Chronic atrial fibrillation (HCC) 09/2014   COPD (chronic obstructive pulmonary disease) (HCC)    Dyslipidemia, goal LDL below 70 12/25/2015   Encounter for tobacco use cessation counseling 12/04/2017   Glaucoma    Hx of migraine headaches    Memory loss    Mitral regurgitation    mild to moderate on echo 09/2014   Old MI (myocardial infarction) 09/2014   cath with normal coronary arteries and EF 50-55%   Osteoarthritis    Pulmonary HTN (HCC) 09/2014   Mild with PASP at cath   RBBB    Rheumatoid arthritis (HCC)    Tobacco abuse     Assessment: Patient presenting with CC of SOB, nausea, reporting the same feeling as  when she had a heart attack. Patient with PMH of afib on Eliquis PTA, last dose 11:30 am, trop 16. Hemoglobin 12.8 and PLTs 311. Cardiology consulted and planned admission. Pharmacy consulted to dose heparin. No bolus with recent DOAC intake.   aPTT is therapeutic, CBC ok.  Goal of Therapy:  Heparin level 0.3-0.7 units/ml aPTT 66-102 Monitor platelets by anticoagulation protocol: Yes   Plan:  Continue heparin 1400 units/h Daily aPTT, heparin level, CBC  Fredonia Highland, PharmD, BCPS, [facility] Clinical Pharmacist [phone] Please check AMION for all [facility] Pharmacy numbers 09/24/2023

## 2023-09-24 NOTE — Inpatient Diabetes Management (Signed)
 Inpatient Diabetes Program Recommendations  AACE/ADA: New Consensus Statement on Inpatient Glycemic Control  Target Ranges:  Prepandial:   less than 140 mg/dL      Peak postprandial:   less than 180 mg/dL (1-2 hours)      Critically ill patients:  140 - 180 mg/dL    Latest Reference Range & Units 09/23/23 06:12 09/23/23 11:18 09/23/23 15:45 09/23/23 21:17 09/24/23 06:04 09/24/23 11:35  Glucose-Capillary 70 - 99 mg/dL 213 (H) 086 (H) 578 (H) 119 (H) 140 (H) 125 (H)   Review of Glycemic Control  Diabetes history: DM2 Outpatient Diabetes medications: Jardiance 10 mg daily, Metformin 500 mg BID Current orders for Inpatient glycemic control: Jardiance 10 mg QAM  Inpatient Diabetes Program Recommendations:    Insulin: Please consider ordering CBGs AC&HS with Novolog 0-6 units TID with meals.  Thanks, Orlando Penner, RN, MSN, CDCES Diabetes Coordinator Inpatient Diabetes Program [phone] (Team Pager from 8am to 5pm)

## 2023-09-24 NOTE — Discharge Summary (Signed)
 Physician Discharge Summary  Theresa Mendoza ZOX:[id number] DOB: 1945-07-18 DOA: 09/22/2023  PCP: [name], MD  Admit date: 09/22/2023 Discharge date: 09/24/2023  Admitted From: Home Disposition: Home  Recommendations for Outpatient Follow-up:  Follow up with PCP in 1 week with repeat CBC/BMP Follow up in ED if symptoms worsen or new appear   Home Health: No Equipment/Devices: None  Discharge Condition: Stable CODE STATUS: Full Diet recommendation: Heart healthy  Brief/Interim Summary: 78 y.o. female with medical history significant of chronic atrial fibrillation on Eliquis, COPD, rheumatoid arthritis, chronic back pain, CAD, chronic systolic heart failure, hypertension, depression and anxiety, type 2 diabetes presented with chest pain.  On presentation, creatinine was 1.27, high-sensitivity troponins were 16 and 15.  She received nitroglycerin and aspirin.  Cardiology was consulted.  She was started on heparin drip.  During the hospitalization, her condition has improved.  She is currently chest pain-free.  Cardiology has cleared the patient for discharge.  Discharge patient home today.  Outpatient follow-up with PCP and cardiology.  Discharge Diagnoses:   Chest pain History of CAD -EKG nonischemic on presentation.  High-sensitivity troponins did not trend up.  Cardiology following.  Currently on heparin drip.  Continue statin.  As needed nitroglycerin. -Currently chest pain-free -She is currently chest pain-free.  Cardiology has cleared the patient for discharge.  Discharge patient home today.  Outpatient follow-up with PCP and cardiology. -Echo showed normal wall motion abnormalities as per cardiology.   Chronic atrial fibrillation -Currently rate controlled mostly.  Eliquis on hold.  On heparin drip.  Resume Eliquis on discharge  Chronic systolic heart failure Hypertension Hyperlipidemia -Continue to diet and fluid restriction.  Cardiology recommending to continue home  regimen except lower dose of Lasix 20 mg daily. -Continue statin.  Outpatient follow-up with cardiology.   AKI -Baseline creatinine around 0.8.  Presented with creatinine of 1.27.  Creatinine 1.3 this morning.  Outpatient follow-up.  Leukocytosis -Resolved   Hypomagnesemia -Improved   Diabetes mellitus type 2 with hyperglycemia -A1c 7.6.  Carb modified diet.  Resume home regimen.  COPD -Stable.  Reports quitting smoking about a year ago.  Continue current inhaled regimen   GERD -Continue PPI   Anxiety and depression -Continue Cymbalta   Chronic back pain -Continue Norco as needed    Discharge Instructions  Discharge Instructions     Ambulatory referral to Cardiology   Complete by: As directed    Diet - low sodium heart healthy   Complete by: As directed    Diet Carb Modified   Complete by: As directed    Increase activity slowly   Complete by: As directed       Allergies as of 09/24/2023       Reactions   Lithium    Patient became suicidal   Tegretol [carbamazepine] Other (See Comments)   UNK reaction        Medication List     TAKE these medications    albuterol 108 (90 Base) MCG/ACT inhaler Commonly known as: VENTOLIN HFA Inhale 2 puffs into the lungs every 4 (four) hours as needed for wheezing or shortness of breath. What changed: when to take this   apixaban 5 MG Tabs tablet Commonly known as: Eliquis Take 1 tablet (5 mg total) by mouth 2 (two) times daily. NEEDS CARDIOLOGY APPT FOR ELIQUIS REFILLS, PLEASE CALL OFFICE   digoxin 0.125 MG tablet Commonly known as: LANOXIN TAKE 1 TABLET BY MOUTH EVERY DAY   diltiazem 240 MG 24 hr capsule Commonly known  as: CARDIZEM CD Take 240 mg by mouth daily.   DULoxetine 60 MG capsule Commonly known as: CYMBALTA TAKE 2 CAPSULES BY MOUTH EVERY DAY   empagliflozin 10 MG Tabs tablet Commonly known as: Jardiance Take 1 tablet (10 mg total) by mouth daily before breakfast.   furosemide 40 MG  tablet Commonly known as: LASIX Take 0.5 tablets (20 mg total) by mouth every other day. What changed: how much to take   HYDROcodone-acetaminophen 10-325 MG tablet Commonly known as: NORCO Take 1 tablet by mouth 5 (five) times daily as needed for moderate pain or severe pain.   losartan 25 MG tablet Commonly known as: COZAAR TAKE 1 TABLET (25 MG TOTAL) BY MOUTH DAILY.   metFORMIN 500 MG tablet Commonly known as: Glucophage Take 1 tablet (500 mg total) by mouth 2 (two) times daily with a meal.   metoprolol succinate 25 MG 24 hr tablet Commonly known as: TOPROL-XL Take 3 tablets (75 mg total) by mouth daily.   nitroGLYCERIN 0.4 MG SL tablet Commonly known as: NITROSTAT Place 1 tablet (0.4 mg total) under the tongue every 5 (five) minutes as needed for chest pain.   omeprazole 40 MG capsule Commonly known as: PRILOSEC Take 40 mg by mouth in the morning. Before breakfast   pravastatin 40 MG tablet Commonly known as: PRAVACHOL Take 1 tablet by mouth daily.   umeclidinium-vilanterol 62.5-25 MCG/ACT Aepb Commonly known as: ANORO ELLIPTA Inhale 1 puff into the lungs daily.        Follow-up Information     Koirala, Dibas, MD. Nyra Capes on 10/01/2023.   Specialty: Family Medicine Why: @3 :30pm Contact information: [address] [place] [address] [phone]         Gaston Islam., NP Follow up.   Specialty: Cardiology Why: Tuesday Oct 23, 2023 Arrive by 8:10 AM Appt at 8:25 AM (25 min) Contact information: [address] [place] [address] [phone]                Allergies  Allergen Reactions   Lithium     Patient became suicidal   Tegretol [Carbamazepine] Other (See Comments)    UNK reaction    Consultations: Cardiology   Procedures/Studies: ECHOCARDIOGRAM COMPLETE Result Date: 09/24/2023    ECHOCARDIOGRAM REPORT   Patient Name:   Theresa Mendoza Date of Exam: 09/24/2023 Medical Rec #:   [id number]          Height:       64.0 in Accession #:    [id number]         Weight:       158.1 lb Date of Birth:  June 20, 1946         BSA:          1.770 m Patient Age:    77 years           BP:           160/106 mmHg Patient Gender: F                  HR:           91 bpm. Exam Location:  Inpatient Procedure: 2D Echo, Cardiac Doppler and Color Doppler (Both Spectral and Color            Flow Doppler were utilized during procedure). Indications:    Chest Pain  History:        Patient has prior history of Echocardiogram examinations. CAD  and Acute MI, Arrythmias:Atrial Fibrillation; Risk                 Factors:Hypertension and Diabetes.  Sonographer:    Lamont Snowball Referring Phys: Alben Spittle BRANCH IMPRESSIONS  1. Left ventricular ejection fraction, by estimation, is 60 to 65%. The left ventricle has normal function. The left ventricle has no regional wall motion abnormalities. Left ventricular diastolic parameters are indeterminate.  2. Right ventricular systolic function is normal. The right ventricular size is normal. There is moderately elevated pulmonary artery systolic pressure. The estimated right ventricular systolic pressure is 45.9 mmHg.  3. Left atrial size was mildly dilated.  4. Right atrial size was mildly dilated.  5. The mitral valve is degenerative. No evidence of mitral valve regurgitation. Mild mitral stenosis. The mean mitral valve gradient is 6.0 mmHg with MVA 2.1 cm^2 by VTI. Moderate mitral annular and valvular calcification.  6. The aortic valve is tricuspid. There is severe calcifcation of the aortic valve. Aortic valve regurgitation is mild. Moderate aortic valve stenosis. Aortic valve area, by VTI measures 1.36 cm. Aortic valve mean gradient measures 33.0 mmHg.  7. The inferior vena cava is dilated in size with <50% respiratory variability, suggesting right atrial pressure of 15 mmHg.  8. The patient was in atrial fibrillation. FINDINGS  Left Ventricle: Left ventricular  ejection fraction, by estimation, is 60 to 65%. The left ventricle has normal function. The left ventricle has no regional wall motion abnormalities. The left ventricular internal cavity size was normal in size. There is  no left ventricular hypertrophy. Left ventricular diastolic parameters are indeterminate. Right Ventricle: The right ventricular size is normal. No increase in right ventricular wall thickness. Right ventricular systolic function is normal. There is moderately elevated pulmonary artery systolic pressure. The tricuspid regurgitant velocity is 2.78 m/s, and with an assumed right atrial pressure of 15 mmHg, the estimated right ventricular systolic pressure is 45.9 mmHg. Left Atrium: Left atrial size was mildly dilated. Right Atrium: Right atrial size was mildly dilated. Pericardium: Trivial pericardial effusion is present. Mitral Valve: The mitral valve is degenerative in appearance. There is moderate calcification of the mitral valve leaflet(s). Moderate mitral annular calcification. No evidence of mitral valve regurgitation. Mild mitral valve stenosis. MV peak gradient, 16.1 mmHg. The mean mitral valve gradient is 6.0 mmHg. Tricuspid Valve: The tricuspid valve is normal in structure. Tricuspid valve regurgitation is trivial. Aortic Valve: The aortic valve is tricuspid. There is severe calcifcation of the aortic valve. Aortic valve regurgitation is mild. Moderate aortic stenosis is present. Aortic valve mean gradient measures 33.0 mmHg. Aortic valve peak gradient measures 51.2 mmHg. Aortic valve area, by VTI measures 1.36 cm. Pulmonic Valve: The pulmonic valve was normal in structure. Pulmonic valve regurgitation is not visualized. Aorta: The aortic root is normal in size and structure. Venous: The inferior vena cava is dilated in size with less than 50% respiratory variability, suggesting right atrial pressure of 15 mmHg. IAS/Shunts: No atrial level shunt detected by color flow Doppler.  LEFT  VENTRICLE PLAX 2D LVIDd:         4.30 cm LVIDs:         2.40 cm LV PW:         1.10 cm LV IVS:        1.20 cm LVOT diam:     1.90 cm LV SV:         100 LV SV Index:   56 LVOT Area:     2.84 cm  RIGHT VENTRICLE          IVC RV Basal diam:  3.30 cm  IVC diam: 2.40 cm TAPSE (M-mode): 2.1 cm LEFT ATRIUM             Index        RIGHT ATRIUM           Index LA diam:        4.20 cm 2.37 cm/m   RA Area:     19.00 cm LA Vol (A2C):   48.6 ml 27.45 ml/m  RA Volume:   49.30 ml  27.85 ml/m LA Vol (A4C):   59.6 ml 33.67 ml/m LA Biplane Vol: 53.3 ml 30.11 ml/m  AORTIC VALVE AV Area (Vmax):    1.21 cm AV Area (Vmean):   1.35 cm AV Area (VTI):     1.36 cm AV Vmax:           357.60 cm/s AV Vmean:          255.000 cm/s AV VTI:            0.734 m AV Peak Grad:      51.2 mmHg AV Mean Grad:      33.0 mmHg LVOT Vmax:         152.00 cm/s LVOT Vmean:        121.500 cm/s LVOT VTI:          0.351 m LVOT/AV VTI ratio: 0.48  AORTA Ao Root diam: 2.70 cm Ao Asc diam:  3.20 cm MITRAL VALVE                TRICUSPID VALVE MV Area (PHT): 3.71 cm     TR Peak grad:   30.9 mmHg MV Area VTI:   2.46 cm     TR Vmax:        278.00 cm/s MV Peak grad:  16.1 mmHg MV Mean grad:  6.0 mmHg     SHUNTS MV Vmax:       2.01 m/s     Systemic VTI:  0.35 m MV Vmean:      105.2 cm/s   Systemic Diam: 1.90 cm MV Decel Time: 205 msec MV E velocity: 186.25 cm/s Dalton McleanMD Electronically signed by Wilfred Lacy Signature Date/Time: 09/24/2023/2:36:39 PM    Final    US RENAL Result Date: 09/23/2023 CLINICAL DATA:  [id number] AKI (acute kidney injury) (HCC) [id number] EXAM: RENAL / URINARY TRACT ULTRASOUND COMPLETE COMPARISON:  None Available. FINDINGS: Right Kidney: Renal measurements: 12.6 x 5.6 x 6.8 cm = volume: 250 mL. Echogenicity within the upper limits of normal. No hydronephrosis visualized. 2.2 cm oval circumscribed anechoic mass with posterior acoustic enhancement most consistent with a benign cyst (for which no dedicated imaging follow-up is  recommended). Left Kidney: Renal measurements: 11.3 x 5.4 x 5.6 cm = volume: 179 mL. Echogenicity within the upper limits of normal. No mass or hydronephrosis visualized. Portions are suboptimally assessed due to shadowing bowel gas. Bladder: Appears normal for degree of bladder distention. Other: None. IMPRESSION: No hydronephrosis. Electronically Signed   By: Meda Klinefelter M.D.   On: 09/23/2023 09:52   DG Chest Port 1 View Result Date: 09/22/2023 CLINICAL DATA:  [id number] Chest pain [id number] EXAM: PORTABLE CHEST 1 VIEW COMPARISON:  October 13, 2021 FINDINGS: The cardiomediastinal silhouette is unchanged in contour.Atherosclerotic calcifications of the aorta. Similar background of diffuse reticular prominence consistent with history of emphysema. No pleural effusion. No pneumothorax. No acute pleuroparenchymal abnormality. Similar sclerotic focus of the  proximal LEFT humeral head. IMPRESSION: No acute cardiopulmonary abnormality. Given underlying emphysema, recommend evaluation for candidacy for annual lung cancer screening CT. Electronically Signed   By: Meda Klinefelter M.D.   On: 09/22/2023 13:18      Subjective: Patient seen and examined at bedside.  No chest pain.  No fever, vomiting, shortness of breath reported.   Discharge Exam: Vitals:   09/24/23 1135 09/24/23 1344  BP: (!) 143/83 (!) 160/106  Pulse: 79 89  Resp: 17   Temp: 98 F (36.7 C)   SpO2: 93%     General: On room air.  No distress ENT/neck: No thyromegaly.  JVD is not elevated  respiratory: Decreased breath sounds at bases bilaterally with some crackles; no wheezing  CVS: S1-S2 heard, rate controlled currently Abdominal: Soft, nontender, slightly distended; no organomegaly, bowel sounds are heard Extremities: Trace lower extremity edema; no cyanosis  CNS: Awake and alert.  Slow to respond.  No focal neurologic deficit.  Moves extremities Lymph: No obvious lymphadenopathy Skin: No obvious ecchymosis/lesions  psych: Flat  affect.  Not agitated. musculoskeletal: No obvious joint swelling/deformity      The results of significant diagnostics from this hospitalization (including imaging, microbiology, ancillary and laboratory) are listed below for reference.     Microbiology: No results found for this or any previous visit (from the past 240 hours).   Labs: BNP (last 3 results) No results for input(s): "BNP" in the last 8760 hours. Basic Metabolic Panel: Recent Labs  Lab 09/22/23 1257 09/23/23 0342 09/24/23 0901  NA 135 138 140  K 3.9 4.1 4.3  CL 98 103 107  CO2 25 25 25   GLUCOSE 125* 178* 325*  BUN 25* 16 15  CREATININE 1.27* 1.11* 1.30*  CALCIUM 8.8* 8.7* 9.0  MG  --  1.4* 1.9   Liver Function Tests: No results for input(s): "AST", "ALT", "ALKPHOS", "BILITOT", "PROT", "ALBUMIN" in the last 168 hours. No results for input(s): "LIPASE", "AMYLASE" in the last 168 hours. No results for input(s): "AMMONIA" in the last 168 hours. CBC: Recent Labs  Lab 09/22/23 1257 09/23/23 0342 09/24/23 0901  WBC 12.9* 11.1* 8.9  HGB 12.8 11.7* 11.8*  HCT 40.1 37.9 37.7  MCV 80.0 80.6 81.1  PLT 311 308 275   Cardiac Enzymes: No results for input(s): "CKTOTAL", "CKMB", "CKMBINDEX", "TROPONINI" in the last 168 hours. BNP: Invalid input(s): "POCBNP" CBG: Recent Labs  Lab 09/23/23 1118 09/23/23 1545 09/23/23 2117 09/24/23 0604 09/24/23 1135  GLUCAP 137* 130* 119* 140* 125*   D-Dimer Recent Labs    09/22/23 2202  DDIMER 0.36   Hgb A1c Recent Labs    09/22/23 2202  HGBA1C 7.6*   Lipid Profile No results for input(s): "CHOL", "HDL", "LDLCALC", "TRIG", "CHOLHDL", "LDLDIRECT" in the last 72 hours. Thyroid function studies Recent Labs    09/22/23 2202  TSH 2.045   Anemia work up No results for input(s): "VITAMINB12", "FOLATE", "FERRITIN", "TIBC", "IRON", "RETICCTPCT" in the last 72 hours. Urinalysis No results found for: "COLORURINE", "APPEARANCEUR", "LABSPEC", "PHURINE", "GLUCOSEU",  "HGBUR", "BILIRUBINUR", "KETONESUR", "PROTEINUR", "UROBILINOGEN", "NITRITE", "LEUKOCYTESUR" Sepsis Labs Recent Labs  Lab 09/22/23 1257 09/23/23 0342 09/24/23 0901  WBC 12.9* 11.1* 8.9   Microbiology No results found for this or any previous visit (from the past 240 hours).   Time coordinating discharge: 35 minutes  SIGNED:   Glade Lloyd, MD  [facility] 09/24/2023, 2:58 PM

## 2023-09-24 NOTE — Plan of Care (Signed)
  Problem: Health Behavior/Discharge Planning: Goal: Ability to manage health-related needs will improve Outcome: Progressing   Problem: Clinical Measurements: Goal: Will remain free from infection Outcome: Progressing Goal: Respiratory complications will improve Outcome: Progressing Goal: Cardiovascular complication will be avoided Outcome: Progressing   Problem: Nutrition: Goal: Adequate nutrition will be maintained Outcome: Progressing   Problem: Elimination: Goal: Will not experience complications related to bowel motility Outcome: Progressing   Problem: Safety: Goal: Ability to remain free from injury will improve Outcome: Progressing   Problem: Education: Goal: Knowledge of disease or condition will improve Outcome: Progressing Goal: Understanding of medication regimen will improve Outcome: Progressing   Problem: Cardiac: Goal: Ability to achieve and maintain adequate cardiopulmonary perfusion will improve Outcome: Progressing   Problem: Activity: Goal: Ability to tolerate increased activity will improve Outcome: Progressing   Problem: Cardiac: Goal: Ability to achieve and maintain adequate cardiopulmonary perfusion will improve Outcome: Progressing   Problem: Health Behavior/Discharge Planning: Goal: Ability to safely manage health-related needs after discharge will improve Outcome: Progressing

## 2023-09-24 NOTE — Telephone Encounter (Signed)
 Hi can you please help me get patient set up for outpatient Lexiscan?  I scheduled outpatient follow-up approximately 1 month so follow-up provider has these results.  Likely will DC within the next day or 2.

## 2023-10-01 DIAGNOSIS — I4821 Permanent atrial fibrillation: Secondary | ICD-10-CM | POA: Diagnosis not present

## 2023-10-01 DIAGNOSIS — J449 Chronic obstructive pulmonary disease, unspecified: Secondary | ICD-10-CM | POA: Diagnosis not present

## 2023-10-01 DIAGNOSIS — E782 Mixed hyperlipidemia: Secondary | ICD-10-CM | POA: Diagnosis not present

## 2023-10-01 DIAGNOSIS — N1831 Chronic kidney disease, stage 3a: Secondary | ICD-10-CM | POA: Diagnosis not present

## 2023-10-01 DIAGNOSIS — B353 Tinea pedis: Secondary | ICD-10-CM | POA: Diagnosis not present

## 2023-10-01 DIAGNOSIS — R079 Chest pain, unspecified: Secondary | ICD-10-CM | POA: Diagnosis not present

## 2023-10-01 DIAGNOSIS — R413 Other amnesia: Secondary | ICD-10-CM | POA: Diagnosis not present

## 2023-10-01 DIAGNOSIS — E1165 Type 2 diabetes mellitus with hyperglycemia: Secondary | ICD-10-CM | POA: Diagnosis not present

## 2023-10-01 DIAGNOSIS — I5042 Chronic combined systolic (congestive) and diastolic (congestive) heart failure: Secondary | ICD-10-CM | POA: Diagnosis not present

## 2023-10-02 ENCOUNTER — Other Ambulatory Visit: Payer: Self-pay | Admitting: Cardiology

## 2023-10-02 DIAGNOSIS — R079 Chest pain, unspecified: Secondary | ICD-10-CM

## 2023-10-09 ENCOUNTER — Encounter: Payer: Self-pay | Admitting: Physician Assistant

## 2023-10-11 ENCOUNTER — Encounter (HOSPITAL_COMMUNITY): Payer: Self-pay | Admitting: Cardiology

## 2023-10-22 NOTE — Progress Notes (Deleted)
 Cardiology Office Note    Patient Name: Theresa Mendoza Date of Encounter: 10/22/2023  Primary Care Provider:  Lanae Pinal, MD Primary Cardiologist:  Gaylyn Keas, MD Primary Electrophysiologist: None   Past Medical History    Past Medical History:  Diagnosis Date   A-fib [facility])    Anxiety    Bipolar 1 disorder (HCC)    Bowel obstruction (HCC)    Cataract    Chronic atrial fibrillation (HCC) 09/2014   COPD (chronic obstructive pulmonary disease) (HCC)    Dyslipidemia, goal LDL below 70 12/25/2015   Encounter for tobacco use cessation counseling 12/04/2017   Glaucoma    Hx of migraine headaches    Memory loss    Mitral regurgitation    mild to moderate on echo 09/2014   Old MI (myocardial infarction) 09/2014   cath with normal coronary arteries and EF 50-55%   Osteoarthritis    Pulmonary HTN (HCC) 09/2014   Mild with PASP at cath   RBBB    Rheumatoid arthritis (HCC)    Tobacco abuse     History of Present Illness  Theresa Mendoza is a 78 y.o. female with PMH of tobacco use, remote MI, permanent AF currently on Eliquis , LBBB, COPD (not on home O2), HLD, bipolar disorder, MR/AS, chronic combined heart failure, pulmonary hypertension who presents today for posthospital follow-up.  Theresa Mendoza was seen on 09/22/2023 with complaint of chest pain.  She endorsed anterior chest and left arm discomfort similar to her previous MI as well as onset of AF.  EKG was completed showing LBBB with permanent AF and troponins were negative.  She was given nitroglycerin  with improvement to discomfort.  She was started on heparin  drip and transferred to [facility] for [facility] evaluation.  She was noted to have improved condition while in the hospital and chest pain subsided.  2D echo was completed showing EF of 60 to 65% with no RWMA and moderate elevated PASP of 45.9 mmHg and mildly dilated LA/RA, mild MS.  Patient will have outpatient Lexiscan  with close follow-up.  Patient denies  chest pain, palpitations, dyspnea, PND, orthopnea, nausea, vomiting, dizziness, syncope, edema, weight gain, or early satiety.   Discussed the use of AI scribe software for clinical note transcription with the patient, who gave verbal consent to proceed.  History of Present Illness    ***Notes: Lexiscan  ordered but not scheduled -Last ischemic evaluation:  Review of Systems  Please see the history of present illness.    All other systems reviewed and are otherwise negative except as noted above.  Physical Exam    Wt Readings from Last 3 Encounters:  09/24/23 158 lb 1.6 oz (71.7 kg)  11/10/21 166 lb (75.3 kg)  10/15/21 162 lb 14.7 oz (73.9 kg)   LK:GMWNU were no vitals filed for this visit.,There is no height or weight on file to calculate BMI. GEN: Well nourished, well developed in no acute distress Neck: No JVD; No carotid bruits Pulmonary: Clear to auscultation without rales, wheezing or rhonchi  Cardiovascular: Normal rate. Regular rhythm. Normal S1. Normal S2.   Murmurs: There is no murmur.  ABDOMEN: Soft, non-tender, non-distended EXTREMITIES:  No edema; No deformity   EKG/LABS/ Recent Cardiac Studies   ECG personally reviewed by me today - ***  Risk Assessment/Calculations:   {Does this patient have ATRIAL FIBRILLATION?:[phone]}      Lab Results  Component Value Date   WBC 8.9 09/24/2023   HGB 11.8 (L) 09/24/2023   HCT 37.7  09/24/2023   MCV 81.1 09/24/2023   PLT 275 09/24/2023   Lab Results  Component Value Date   CREATININE 1.30 (H) 09/24/2023   BUN 15 09/24/2023   NA 140 09/24/2023   K 4.3 09/24/2023   CL 107 09/24/2023   CO2 25 09/24/2023   No results found for: "CHOL", "HDL", "LDLCALC", "LDLDIRECT", "TRIG", "CHOLHDL"  Lab Results  Component Value Date   HGBA1C 7.6 (H) 09/22/2023   Assessment & Plan    1.  CAD/chest pain:  2.Chronic combined Systolic and Diastolic CHF, Pulmonary HTN:  -2D echo completed showing EF of 60 to 65% with no RWMA  and moderate elevated PASP of 45.9 mmHg   3.Aortic stenosis and  Mitral valve insufficiency:   4.Permanent Atrial Fibrillation:   5.  History of COPD:    Disposition: Follow-up with Gaylyn Keas, MD or APP in *** months {Are you ordering a CV Procedure (e.g. stress test, cath, DCCV, TEE, etc)?   Press F2        :[id number]}   Signed, Francene Ing, Retha Cast, NP 10/22/2023, 9:45 AM [facility]

## 2023-10-23 ENCOUNTER — Ambulatory Visit: Attending: Cardiology | Admitting: Nurse Practitioner

## 2023-10-23 DIAGNOSIS — I5042 Chronic combined systolic (congestive) and diastolic (congestive) heart failure: Secondary | ICD-10-CM

## 2023-10-23 DIAGNOSIS — I2510 Atherosclerotic heart disease of native coronary artery without angina pectoris: Secondary | ICD-10-CM

## 2023-10-23 DIAGNOSIS — R079 Chest pain, unspecified: Secondary | ICD-10-CM

## 2023-10-23 DIAGNOSIS — F17200 Nicotine dependence, unspecified, uncomplicated: Secondary | ICD-10-CM

## 2023-10-23 DIAGNOSIS — I340 Nonrheumatic mitral (valve) insufficiency: Secondary | ICD-10-CM

## 2023-10-23 DIAGNOSIS — J441 Chronic obstructive pulmonary disease with (acute) exacerbation: Secondary | ICD-10-CM

## 2023-10-23 DIAGNOSIS — I350 Nonrheumatic aortic (valve) stenosis: Secondary | ICD-10-CM

## 2023-11-05 DIAGNOSIS — I10 Essential (primary) hypertension: Secondary | ICD-10-CM | POA: Diagnosis not present

## 2023-11-05 DIAGNOSIS — N1831 Chronic kidney disease, stage 3a: Secondary | ICD-10-CM | POA: Diagnosis not present

## 2023-11-05 DIAGNOSIS — E1165 Type 2 diabetes mellitus with hyperglycemia: Secondary | ICD-10-CM | POA: Diagnosis not present

## 2023-11-05 DIAGNOSIS — R6889 Other general symptoms and signs: Secondary | ICD-10-CM | POA: Diagnosis not present

## 2023-11-06 ENCOUNTER — Encounter: Payer: Self-pay | Admitting: Physician Assistant

## 2023-11-06 ENCOUNTER — Ambulatory Visit: Attending: Physician Assistant | Admitting: Physician Assistant

## 2023-11-06 VITALS — BP 150/50 | HR 40 | Ht 64.0 in | Wt 156.0 lb

## 2023-11-06 DIAGNOSIS — N1831 Chronic kidney disease, stage 3a: Secondary | ICD-10-CM | POA: Diagnosis not present

## 2023-11-06 DIAGNOSIS — E7800 Pure hypercholesterolemia, unspecified: Secondary | ICD-10-CM | POA: Diagnosis not present

## 2023-11-06 DIAGNOSIS — R001 Bradycardia, unspecified: Secondary | ICD-10-CM | POA: Diagnosis not present

## 2023-11-06 DIAGNOSIS — I10 Essential (primary) hypertension: Secondary | ICD-10-CM | POA: Diagnosis not present

## 2023-11-06 DIAGNOSIS — R072 Precordial pain: Secondary | ICD-10-CM | POA: Diagnosis not present

## 2023-11-06 DIAGNOSIS — I350 Nonrheumatic aortic (valve) stenosis: Secondary | ICD-10-CM | POA: Diagnosis not present

## 2023-11-06 DIAGNOSIS — I5032 Chronic diastolic (congestive) heart failure: Secondary | ICD-10-CM | POA: Diagnosis not present

## 2023-11-06 DIAGNOSIS — I4821 Permanent atrial fibrillation: Secondary | ICD-10-CM | POA: Diagnosis not present

## 2023-11-06 LAB — COMPREHENSIVE METABOLIC PANEL WITH GFR: EGFR (Non-African Amer.): 44

## 2023-11-06 NOTE — Patient Instructions (Signed)
 Medication Instructions:  Your physician has recommended you make the following change in your medication:  STOP Digoxin   *If you need a refill on your cardiac medications before your next appointment, please call your pharmacy*  Lab Work: TODAY:  CBC & DIGOXIN  LEVEL  If you have labs (blood work) drawn today and your tests are completely normal, you will receive your results only by: MyChart Message (if you have MyChart) OR A paper copy in the mail If you have any lab test that is abnormal or we need to change your treatment, we will call you to review the results.  Testing/Procedures: None ordered  Follow-Up: At [facility], you and your health needs are our priority.  As part of our continuing mission to provide you with exceptional heart care, our providers are all part of one team.  This team includes your primary Cardiologist (physician) and Advanced Practice Providers or APPs (Physician Assistants and Nurse Practitioners) who all work together to provide you with the care you need, when you need it.  Your next appointment:   2 day(s)  FOR A EKG 2 Weeks TO SEE SCOTT WEAVER Provider:   Marlyse Single, PA-C         We recommend signing up for the patient portal called "MyChart".  Sign up information is provided on this After Visit Summary.  MyChart is used to connect with patients for Virtual Visits (Telemedicine).  Patients are able to view lab/test results, encounter notes, upcoming appointments, etc.  Non-urgent messages can be sent to your provider as well.   To learn more about what you can do with MyChart, go to [email or web address].   Other Instructions       1st Floor: - Lobby - Registration  - Pharmacy  - Lab - Cafe  2nd Floor: - PV Lab - Diagnostic Testing (echo, CT, nuclear med)  3rd Floor: - Vacant  4th Floor: - TCTS (cardiothoracic surgery) - AFib Clinic - Structural Heart Clinic - Vascular Surgery  - Vascular Ultrasound  5th  Floor: - HeartCare Cardiology (general and EP) - Clinical Pharmacy for coumadin , hypertension, lipid, weight-loss medications, and med management appointments    Valet parking services will be available as well.

## 2023-11-06 NOTE — Progress Notes (Signed)
 Cardiology Office Note:    Date:  11/06/2023  ID:  FRANCYS BOLIN, DOB 1946-03-16, MRN [id number] PCP: Lanae Pinal, MD  [facility] Providers Cardiologist:  Gaylyn Keas, MD       Patient Profile:      Remote hx of MI (Records from [facility] ([place]): no CAD by cath in 2016) MPI 02/01/16: no ischemia, EF 65 Permanent atrial fibrillation  HFmrEF (heart failure with mildly reduced ejection fraction)  Mild mitral regurgitation Mild to mod aortic stenosis  TTE 10/03/2020: EF 40-45, mild MR, mild-moderate aortic stenosis (mean gradient 14.5, V-max 246 cm/s, DI 0.44) TTE 09/24/23: EF 60-65, no RWMA, NL RVSF, mod pulmonary HTN, RVSP 45.9, mild BAE, no MR, mild MS (mean MV gradient 6 mmHg), severe AV Ca2+, mild AI, mod AS (mean AV gradient 33 mmHg, Vmax 357.6 cm/s, DI 0.48) Right Bundle Branch Block Hyperlipidemia  Chronic Obstructive Pulmonary Disease Pulmonary hypertension  LHC 09/2014 ([place] PASP 44 mmHg TTE 09/2023: RVSP 45.9 Rheumatoid arthritis        Discussed the use of AI scribe software for clinical note transcription with the patient, who gave verbal consent to proceed.  History of Present Illness Theresa Mendoza is a 78 y.o. female who returns for posthospitalization follow-up. She was last seen in 10/2021 by Charles Connor, NP.  She was admitted 3/8-3/10 with nitroglycerin  responsive chest pain.  Creatinine was elevated at 1.27 (baseline 0.8).  Her hsTroponins were negative.  EKG did not demonstrate acute changes.  Echocardiogram demonstrated normal EF and no wall motion abnormalities.  She was followed by cardiology and plan was for outpatient nuclear stress test.  This has not been scheduled yet.  She is here with her son. She started experiencing lightheadedness and dizziness today, particularly when walking or exerting herself, without episodes of syncope, though she feels as though she might faint. She has not had to take further nitroglycerin .  Since her recent hospital  discharge, she has not had chest pain. She has experienced episodes of her heart racing, notably once since returning home from the hospital. She experiences difficulty breathing at night, especially when lying on her back, and uses an oxygen  machine occasionally. She experiences itching from the machine, which affects her sleep. She denies using pillows to prop herself up at night. No bleeding, black or bloody stools, fever, or cough. She reports some swelling in her feet, which makes walking uncomfortable. She has lost some weight intentionally. She quit smoking two years ago after smoking for 55 years.    ROS-See HPI     Studies Reviewed:   EKG Interpretation Date/Time:  Tuesday November 06 2023 14:43:39 EDT Ventricular Rate:  40 PR Interval:    QRS Duration:  146 QT Interval:  486 QTC Calculation: 396 R Axis:   -73  Text Interpretation: Atrial fibrillation with slow ventricular response Left axis deviation Right bundle branch block Left ventricular hypertrophy with repolarization abnormality Confirmed by Marlyse Single [phone]) on 11/06/2023 2:47:48 PM     Results Recent Labs    09/24/23 0901  K 4.3  CREATININE 1.30*  HGB 11.8*   LABS -from PCP personally reviewed and interpreted /21/25: BUN 24, creatinine 1.27, potassium 4.5, ALT 44   Risk Assessment/Calculations:    CHA2DS2-VASc Score = 6   This indicates a 9.7% annual risk of stroke. The patient's score is based upon: CHF History: 1 HTN History: 1 Diabetes History: 0 Stroke History: 0 Vascular Disease History: 1 Age Score: 2 Gender Score: 1  HYPERTENSION CONTROL Vitals:   11/06/23 1429 11/06/23 1514  BP: (!) 140/58 (!) 150/50    The patient's blood pressure is elevated above target today.  In order to address the patient's elevated BP: Blood pressure will be monitored at home to determine if medication changes need to be made.          Physical Exam:   VS:  BP (!) 150/50   Pulse (!) 40   Ht 5\' 4"  (1.626 m)    Wt 156 lb (70.8 kg)   SpO2 96%   BMI 26.78 kg/m    Wt Readings from Last 3 Encounters:  11/06/23 156 lb (70.8 kg)  09/24/23 158 lb 1.6 oz (71.7 kg)  11/10/21 166 lb (75.3 kg)    Constitutional:      Appearance: Healthy appearance. Not in distress.  Neck:     Vascular: JVD normal.  Pulmonary:     Breath sounds: No wheezing. No rales.  Cardiovascular:     Bradycardia present. Irregularly irregular rhythm.     Murmurs: There is a grade 2/6 systolic murmur at the URSB.  Edema:    Peripheral edema absent.  Abdominal:     Palpations: Abdomen is soft.  Skin:    General: Skin is warm and dry.        Assessment and Plan:   Assessment & Plan Permanent atrial fibrillation with Slow Ventricular Response She presents today with symptomatic bradycardia (atrial fibrillation with slow ventricular response).  She has symptoms of lightheadedness and dizziness, particularly with exertion.  She has not had syncope.  She remains on digoxin , diltiazem  and metoprolol  succinate.  Creatinine was recently increased in the hospital.  I was able to obtain labs from primary care done yesterday.  Her creatinine remained stable.  I will discontinue her digoxin .  She has had some episodes of rapid rate in the past.  Most notably, in 2023, this occurred in the setting of COPD exacerbation.  She has noted some fast heart rates at home as well.  Hopefully, she is not experiencing tachybradycardia syndrome.  She will need close follow-up. - Discontinue digoxin . - Return for nurse visit in 2 days with repeat EKG - She knows to go to the emergency room if she has worsening symptoms or syncope - Order digoxin  level and CBC today. - Consider Zio monitor at follow-up to assess for tachybradycardia  Heart failure with improved ejection fraction Ejection fraction improved to 60-65% as of March 2025.  Functional decline is mostly related to bradycardia.  Volume status appears to be stable. - Continue Lasix  20 mg  daily - Continue Jardiance  10 mg daily - Continue losartan  25 mg daily, metoprolol  succinate 75 mg daily  Moderate aortic stenosis Moderate aortic stenosis with mean gradient of 33 mmHg and Vmax of 357.6 cm/s.  I do not think her symptoms suggest worse aortic stenosis.  Her symptoms seem to be related to bradycardia.  Continue annual surveillance.  Hypertension Blood pressure elevated at 150/50 during visit. Possible compensatory mechanism due to bradycardia. No changes to antihypertensive regimen until heart rate is stabilized.  Chronic kidney disease Recent creatinine stable at 1.27.      Dispo:  Return in about 2 weeks (around 11/20/2023) for Close Follow Up, w/ Dr. Micael Adas, or Marlyse Single, PA-C.  Signed, Marlyse Single, PA-C

## 2023-11-07 ENCOUNTER — Encounter: Payer: Self-pay | Admitting: *Deleted

## 2023-11-07 LAB — CBC
Hematocrit: 40.3 % (ref 34.0–46.6)
Hemoglobin: 12.3 g/dL (ref 11.1–15.9)
MCH: 23.9 pg — ABNORMAL LOW (ref 26.6–33.0)
MCHC: 30.5 g/dL — ABNORMAL LOW (ref 31.5–35.7)
MCV: 78 fL — ABNORMAL LOW (ref 79–97)
Platelets: 366 10*3/uL (ref 150–450)
RBC: 5.14 x10E6/uL (ref 3.77–5.28)
RDW: 15.5 % — ABNORMAL HIGH (ref 11.7–15.4)
WBC: 11 10*3/uL — ABNORMAL HIGH (ref 3.4–10.8)

## 2023-11-07 LAB — DIGOXIN LEVEL: Digoxin, Serum: 1.2 ng/mL — ABNORMAL HIGH (ref 0.5–0.9)

## 2023-11-08 ENCOUNTER — Ambulatory Visit: Attending: Cardiology

## 2023-11-08 VITALS — BP 118/68 | HR 67 | Resp 20 | Wt 155.6 lb

## 2023-11-08 DIAGNOSIS — I4821 Permanent atrial fibrillation: Secondary | ICD-10-CM

## 2023-11-08 NOTE — Progress Notes (Signed)
   Nurse Visit   Date of Encounter: 11/08/2023 ID: CAIT LOCUST, DOB 06/09/1946, MRN [id number]  PCP:  Lanae Pinal, MD   [facility] Providers Cardiologist:  Gaylyn Keas, MD      Visit Details   VS:  BP 118/68 (BP Location: Right Arm, Patient Position: Sitting, Cuff Size: Normal)   Pulse 67   Resp 20   Wt 155 lb 9.6 oz (70.6 kg)   SpO2 97%   BMI 26.71 kg/m  , BMI Body mass index is 26.71 kg/m.  Wt Readings from Last 3 Encounters:  11/08/23 155 lb 9.6 oz (70.6 kg)  11/06/23 156 lb (70.8 kg)  09/24/23 158 lb 1.6 oz (71.7 kg)     Reason for visit: EKG Performed today: Vitals, EKG, Provider consulted:Dr Arlester Ladd, and Education Changes (medications, testing, etc.) : none Length of Visit: 20 minutes    Medications Adjustments/Labs and Tests Ordered: Orders Placed This Encounter  Procedures   EKG 12-Lead   No orders of the defined types were placed in this encounter.   Patient presented in office today for repeat EKG since discontinuing digoxin  from last office visit on 11/06/23 due to symptomatic bradycardia. Patient has no complaints, son present also and states she's been at baseline past couple of days. Dr Arlester Ladd (DOD) reviewed EKG, no changes needed.   Signed, Dareen Ebbing, RN  11/08/2023 3:05 PM

## 2023-11-20 NOTE — Progress Notes (Unsigned)
 Cardiology Office Note:    Date:  11/21/2023  ID:  Theresa Mendoza, DOB 19-Jul-1945, MRN [id number] PCP: Lanae Pinal, MD  [facility] Providers Cardiologist:  Gaylyn Keas, MD       Patient Profile:      Remote hx of MI (Records from [facility]: no CAD by cath in 2016) MPI 02/01/16: no ischemia, EF 65 Permanent atrial fibrillation  HFmrEF (heart failure with mildly reduced ejection fraction)  Mild mitral regurgitation Mild to mod aortic stenosis  TTE 10/03/2020: EF 40-45, mild MR, mild-moderate aortic stenosis (mean gradient 14.5, V-max 246 cm/s, DI 0.44) TTE 09/24/23: EF 60-65, no RWMA, NL RVSF, mod pulmonary HTN, RVSP 45.9, mild BAE, no MR, mild MS (mean MV gradient 6 mmHg), severe AV Ca2+, mild AI, mod AS (mean AV gradient 33 mmHg, Vmax 357.6 cm/s, DI 0.48) Right Bundle Branch Block Hyperlipidemia  Chronic Obstructive Pulmonary Disease Pulmonary hypertension  LHC 09/2014 ([place] PASP 44 mmHg TTE 09/2023: RVSP 45.9 Rheumatoid arthritis         Discussed the use of AI scribe software for clinical note transcription with the patient, who gave verbal consent to proceed.  History of Present Illness Theresa Mendoza is a 78 y.o. female who returns for follow up of bradycardia. She was last seen 11/06/23. She was following up after a recent admission for chest pain. OP Lexiscan  MPI was recommended but not scheduled. She was significantly bradycardic and symptomatic. I stopped her Digoxin . A follow up EKG a few days later demonstrated improved HR.   She is here with her son.  Since stopping digoxin , she feels better overall.  She experiences daily nausea, unrelated to food intake or physical exertion, with an episode of severe nausea and dry heaving one night. Has not consulted her primary care physician about this persistent nausea.  She has not experienced a recurrence of chest pain since hospital discharge, except for discomfort when lying in certain positions, attributed to  positioning. No exertional chest pain, shortness of breath, or orthopnea. Feet remain swollen but not worsened. No syncope or dizziness.    ROS-See HPI    Studies Reviewed:   EKG Interpretation Date/Time:  Wednesday Nov 21 2023 08:52:50 EDT Ventricular Rate:  85 PR Interval:  236 QRS Duration:  146 QT Interval:  430 QTC Calculation: 511 R Axis:   -69  Text Interpretation: Atrial fibrillation Right bundle branch block Left anterior fascicular block Bifascicular block Left ventricular hypertrophy with repolarization abnormality Confirmed by Marlyse Single [phone]) on 11/21/2023 5:52:00 PM    Results    Risk Assessment/Calculations:    CHA2DS2-VASc Score = 6   This indicates a 9.7% annual risk of stroke. The patient's score is based upon: CHF History: 1 HTN History: 1 Diabetes History: 0 Stroke History: 0 Vascular Disease History: 1 Age Score: 2 Gender Score: 1    HYPERTENSION CONTROL Vitals:   11/21/23 0839 11/21/23 1755  BP: (!) 146/42 (!) 156/60    The patient's blood pressure is elevated above target today.  In order to address the patient's elevated BP: A current anti-hypertensive medication was adjusted today.          Physical Exam:   VS:  BP (!) 156/60   Pulse 86   Ht 5\' 4"  (1.626 m)   Wt 157 lb (71.2 kg)   SpO2 94%   BMI 26.95 kg/m    Wt Readings from Last 3 Encounters:  11/21/23 157 lb (71.2 kg)  11/08/23 155 lb 9.6 oz (  70.6 kg)  11/06/23 156 lb (70.8 kg)    Constitutional:      Appearance: Healthy appearance. Not in distress.  Neck:     Vascular: JVD normal.  Pulmonary:     Breath sounds: Normal breath sounds. No wheezing. No rales.  Cardiovascular:     Normal rate. Irregularly irregular rhythm.     Murmurs: There is a grade 2/6 harsh systolic murmur at the URSB.  Edema:    Peripheral edema absent.  Abdominal:     Palpations: Abdomen is soft.        Assessment and Plan:   Assessment & Plan Permanent atrial fibrillation (HCC) Heart  rate is improved off of digoxin .  Overall heart rate is controlled. -Continue Eliquis  5 mg twice daily, Cardizem  CD2 40 mg daily -Adjust metoprolol  succinate to 100 mg daily for better blood pressure control -Follow-up 6 months Nonrheumatic aortic valve stenosis Moderate aortic stenosis by echocardiogram March 2025 with mean gradient 33 mmHg V-max 357.6 cm/s. -Repeat echo March 2026 for surveillance Heart failure with improved ejection fraction (HFimpEF) (HCC) EF was 40-45 in March 2022.  This improved to 60-65 by echocardiogram March 2025.  Volume status is stable.  NYHA IIb-III. -Continue Lasix  20 mg every other day, losartan  25 mg daily, Jardiance  10 mg daily -Adjust metoprolol  succinate to 100 mg daily for better blood pressure control Essential hypertension Blood pressure above target. -Continue Cardizem  CD 240 mg daily, losartan  25 mg daily -Increase metoprolol  succinate to 100 mg daily Stage 3a chronic kidney disease (HCC) Recent creatinine stable Nausea and vomiting, unspecified vomiting type She notes fairly consistent nausea and vomiting.  I have asked her to follow-up with primary care for further evaluation and management. Precordial chest pain She was admitted in March with chest discomfort responsive to nitroglycerin .  Cardiac workup was negative.  Troponins were negative.  At last visit, we did not arrange stress testing as she was dealing with significant bradycardia in the setting of digoxin  therapy.  This is resolved.  We reviewed her chest symptoms again from when she was admitted to the hospital in March.  She has not had a recurrence.  We had a long discussion regarding whether or not to proceed with stress testing.  It would be reasonable to proceed at this time versus watchful waiting given no recurrence of symptoms since discharge.  She prefers to hold off for now.  She knows to contact me if she would like to proceed with stress testing.         Dispo:  Return in  about 6 months (around 05/23/2024) for Dr. Micael Adas / Marlyse Single.  Signed, Marlyse Single, PA-C

## 2023-11-21 ENCOUNTER — Ambulatory Visit: Attending: Physician Assistant | Admitting: Physician Assistant

## 2023-11-21 ENCOUNTER — Encounter: Payer: Self-pay | Admitting: Physician Assistant

## 2023-11-21 VITALS — BP 156/60 | HR 86 | Ht 64.0 in | Wt 157.0 lb

## 2023-11-21 DIAGNOSIS — I4821 Permanent atrial fibrillation: Secondary | ICD-10-CM | POA: Diagnosis not present

## 2023-11-21 DIAGNOSIS — N1831 Chronic kidney disease, stage 3a: Secondary | ICD-10-CM

## 2023-11-21 DIAGNOSIS — I350 Nonrheumatic aortic (valve) stenosis: Secondary | ICD-10-CM | POA: Diagnosis not present

## 2023-11-21 DIAGNOSIS — I10 Essential (primary) hypertension: Secondary | ICD-10-CM | POA: Diagnosis not present

## 2023-11-21 DIAGNOSIS — I5020 Unspecified systolic (congestive) heart failure: Secondary | ICD-10-CM

## 2023-11-21 DIAGNOSIS — R072 Precordial pain: Secondary | ICD-10-CM

## 2023-11-21 DIAGNOSIS — R112 Nausea with vomiting, unspecified: Secondary | ICD-10-CM

## 2023-11-21 DIAGNOSIS — I5032 Chronic diastolic (congestive) heart failure: Secondary | ICD-10-CM

## 2023-11-21 MED ORDER — METOPROLOL SUCCINATE ER 100 MG PO TB24: 100.0000 mg | ORAL_TABLET | Freq: Every day | ORAL | 3 refills | Status: DC

## 2023-11-21 NOTE — Assessment & Plan Note (Signed)
 Moderate aortic stenosis by echocardiogram March 2025 with mean gradient 33 mmHg V-max 357.6 cm/s. -Repeat echo March 2026 for surveillance

## 2023-11-21 NOTE — Assessment & Plan Note (Signed)
 EF was 40-45 in March 2022.  This improved to 60-65 by echocardiogram March 2025.  Volume status is stable.  NYHA IIb-III. -Continue Lasix  20 mg every other day, losartan  25 mg daily, Jardiance  10 mg daily -Adjust metoprolol  succinate to 100 mg daily for better blood pressure control

## 2023-11-21 NOTE — Patient Instructions (Signed)
 Medication Instructions:  Your physician has recommended you make the following change in your medication:  INCREASE the Toprol  XL to 100 mg taking 1 daily  *If you need a refill on your cardiac medications before your next appointment, please call your pharmacy*  Lab Work: None orderd If you have labs (blood work) drawn today and your tests are completely normal, you will receive your results only by: MyChart Message (if you have MyChart) OR A paper copy in the mail If you have any lab test that is abnormal or we need to change your treatment, we will call you to review the results.  Testing/Procedures: Your physician has requested that you have an echocardiogram 09/2024. Echocardiography is a painless test that uses sound waves to create images of your heart. It provides your doctor with information about the size and shape of your heart and how well your heart's chambers and valves are working. This procedure takes approximately one hour. There are no restrictions for this procedure. Please do NOT wear cologne, perfume, aftershave, or lotions (deodorant is allowed). Please arrive 15 minutes prior to your appointment time.  Please note: We ask at that you not bring children with you during ultrasound (echo/ vascular) testing. Due to room size and safety concerns, children are not allowed in the ultrasound rooms during exams. Our front office staff cannot provide observation of children in our lobby area while testing is being conducted. An adult accompanying a patient to their appointment will only be allowed in the ultrasound room at the discretion of the ultrasound technician under special circumstances. We apologize for any inconvenience.   Follow-Up: At [facility], you and your health needs are our priority.  As part of our continuing mission to provide you with exceptional heart care, our providers are all part of one team.  This team includes your primary Cardiologist  (physician) and Advanced Practice Providers or APPs (Physician Assistants and Nurse Practitioners) who all work together to provide you with the care you need, when you need it.  Your next appointment:   6 month(s)  Provider:   Gaylyn Keas, MD or Marlyse Single, PA-C          We recommend signing up for the patient portal called "MyChart".  Sign up information is provided on this After Visit Summary.  MyChart is used to connect with patients for Virtual Visits (Telemedicine).  Patients are able to view lab/test results, encounter notes, upcoming appointments, etc.  Non-urgent messages can be sent to your provider as well.   To learn more about what you can do with MyChart, go to [email or web address].   Other Instructions

## 2023-11-21 NOTE — Assessment & Plan Note (Signed)
 Heart rate is improved off of digoxin .  Overall heart rate is controlled. -Continue Eliquis  5 mg twice daily, Cardizem  CD2 40 mg daily -Adjust metoprolol  succinate to 100 mg daily for better blood pressure control -Follow-up 6 months

## 2023-11-21 NOTE — Assessment & Plan Note (Signed)
 Blood pressure above target. -Continue Cardizem  CD 240 mg daily, losartan  25 mg daily -Increase metoprolol  succinate to 100 mg daily

## 2023-11-21 NOTE — Assessment & Plan Note (Signed)
Recent creatinine stable. 

## 2023-11-22 NOTE — Progress Notes (Signed)
 Order(s) created erroneously. Erroneous order ID: [id number]  Order moved by: CHART CORRECTION ANALYST NINE, IDENTITY  Order move date/time: 11/22/2023 7:05 AM  Source Patient: [id number]  Source Contact: 11/21/2023  Destination Patient: [id number]  Destination Contact: 12/27/2022

## 2023-11-23 ENCOUNTER — Ambulatory Visit

## 2023-11-23 ENCOUNTER — Other Ambulatory Visit

## 2023-11-23 ENCOUNTER — Encounter: Payer: Self-pay | Admitting: Physician Assistant

## 2023-11-23 ENCOUNTER — Ambulatory Visit (INDEPENDENT_AMBULATORY_CARE_PROVIDER_SITE_OTHER): Admitting: Physician Assistant

## 2023-11-23 VITALS — Resp 20 | Ht 64.0 in | Wt 153.0 lb

## 2023-11-23 DIAGNOSIS — R413 Other amnesia: Secondary | ICD-10-CM

## 2023-11-23 NOTE — Patient Instructions (Addendum)
 It was a pleasure to see you today at our office.   Recommendations:  Neurocognitive evaluation at our office   MRI of the brain, the radiology office will call you to arrange you appointment  [phone] Check labs today  [address] Referral to psychiatry  Follow up in 4  months     For psychiatric meds, mood meds: Please have your primary care physician manage these medications.  If you have any severe symptoms of a stroke, or other severe issues such as confusion,severe chills or fever, etc call 911 or go to the ER as you may need to be evaluated further   For guidance regarding [facility] and if placement were needed at the facility, contact Social Worker tel: [phone]  For assessment of decision of mental capacity and competency:  Call Dr. Laverne Potter, geriatric psychiatrist at [phone]  Counseling regarding caregiver distress, including caregiver depression, anxiety and issues regarding community resources, adult day care programs, adult living facilities, or memory care questions:  please contact your  Primary Doctor's Social Worker   Whom to call: Memory  decline, memory medications: Call our office [phone]    [email or web address]   RECOMMENDATIONS FOR ALL PATIENTS WITH MEMORY PROBLEMS: 1. Continue to exercise (Recommend 30 minutes of walking everyday, or 3 hours every week) 2. Increase social interactions - continue going to [place] and enjoy social gatherings with friends and family 3. Eat healthy, avoid fried foods and eat more fruits and vegetables 4. Maintain adequate blood pressure, blood sugar, and blood cholesterol level. Reducing the risk of stroke and cardiovascular disease also helps promoting better memory. 5. Avoid stressful situations. Live a simple life and avoid aggravations. Organize your time and prepare for the next day in anticipation. 6. Sleep well, avoid any  interruptions of sleep and avoid any distractions in the bedroom that may interfere with adequate sleep quality 7. Avoid sugar, avoid sweets as there is a strong link between excessive sugar intake, diabetes, and cognitive impairment We discussed the Mediterranean diet, which has been shown to help patients reduce the risk of progressive memory disorders and reduces cardiovascular risk. This includes eating fish, eat fruits and green leafy vegetables, nuts like almonds and hazelnuts, walnuts, and also use olive oil. Avoid fast foods and fried foods as much as possible. Avoid sweets and sugar as sugar use has been linked to worsening of memory function.  There is always a concern of gradual progression of memory problems. If this is the case, then we may need to adjust level of care according to patient needs. Support, both to the patient and caregiver, should then be put into place.      You have been referred for a neuropsychological evaluation (i.e., evaluation of memory and thinking abilities). Please bring someone with you to this appointment if possible, as it is helpful for the doctor to hear from both you and another adult who knows you well. Please bring eyeglasses and hearing aids if you wear them.    The evaluation will take approximately 3 hours and has two parts:   The first part is a clinical interview with the neuropsychologist (Dr. Kitty Perkins or Dr. Donavon Fudge). During the interview, the neuropsychologist will speak with you and the individual you brought to the appointment.    The second part of the evaluation is testing with the doctor's technician Bernabe Brew or Burdette Carolin). During the testing, the technician will ask you to remember different types of material, solve problems, and answer some questionnaires.  Your family member will not be present for this portion of the evaluation.   Please note: We must reserve several hours of the neuropsychologist's time and the psychometrician's time for your  evaluation appointment. As such, there is a No-Show fee of $100. If you are unable to attend any of your appointments, please contact our office as soon as possible to reschedule.      DRIVING: Regarding driving, in patients with progressive memory problems, driving will be impaired. We advise to have someone else do the driving if trouble finding directions or if minor accidents are reported. Independent driving assessment is available to determine safety of driving.      FALL PRECAUTIONS: Be cautious when walking. Scan the area for obstacles that may increase the risk of trips and falls. When getting up in the mornings, sit up at the edge of the bed for a few minutes before getting out of bed. Consider elevating the bed at the head end to avoid drop of blood pressure when getting up. Walk always in a well-lit room (use night lights in the walls). Avoid area rugs or power cords from appliances in the middle of the walkways. Use a walker or a cane if necessary and consider physical therapy for balance exercise. Get your eyesight checked regularly.  FINANCIAL OVERSIGHT: Supervision, especially oversight when making financial decisions or transactions is also recommended.  HOME SAFETY: Consider the safety of the kitchen when operating appliances like stoves, microwave oven, and blender. Consider having supervision and share cooking responsibilities until no longer able to participate in those. Accidents with firearms and other hazards in the house should be identified and addressed as well.   ABILITY TO BE LEFT ALONE: If patient is unable to contact 911 operator, consider using LifeLine, or when the need is there, arrange for someone to stay with patients. Smoking is a fire hazard, consider supervision or cessation. Risk of wandering should be assessed by caregiver and if detected at any point, supervision and safe proof recommendations should be instituted.  MEDICATION SUPERVISION: Inability to  self-administer medication needs to be constantly addressed. Implement a mechanism to ensure safe administration of the medications.      Mediterranean Diet A Mediterranean diet refers to food and lifestyle choices that are based on the traditions of countries located on the [employer] [place]. This way of eating has been shown to help prevent certain conditions and improve outcomes for people who have chronic diseases, like kidney disease and heart disease. What are tips for following this plan? Lifestyle  Cook and eat meals together with your family, when possible. Drink enough fluid to keep your urine clear or pale yellow. Be physically active every day. This includes: Aerobic exercise like running or swimming. Leisure activities like gardening, walking, or housework. Get 7-8 hours of sleep each night. If recommended by your health care provider, drink red wine in moderation. This means 1 glass a day for nonpregnant women and 2 glasses a day for men. A glass of wine equals 5 oz (150 mL). Reading food labels  Check the serving size of packaged foods. For foods such as rice and pasta, the serving size refers to the amount of cooked product, not dry. Check the total fat in packaged foods. Avoid foods that have saturated fat or trans fats. Check the ingredients list for added sugars, such as corn syrup. Shopping  At the grocery store, buy most of your food from the areas near the walls of the store. This includes:  Fresh fruits and vegetables (produce). Grains, beans, nuts, and seeds. Some of these may be available in unpackaged forms or large amounts (in bulk). Fresh seafood. Poultry and eggs. Low-fat dairy products. Buy whole ingredients instead of prepackaged foods. Buy fresh fruits and vegetables in-season from local farmers markets. Buy frozen fruits and vegetables in resealable bags. If you do not have access to quality fresh seafood, buy precooked frozen shrimp or canned fish, such  as tuna, salmon, or sardines. Buy small amounts of raw or cooked vegetables, salads, or olives from the deli or salad bar at your store. Stock your pantry so you always have certain foods on hand, such as olive oil, canned tuna, canned tomatoes, rice, pasta, and beans. Cooking  Cook foods with extra-virgin olive oil instead of using butter or other vegetable oils. Have meat as a side dish, and have vegetables or grains as your main dish. This means having meat in small portions or adding small amounts of meat to foods like pasta or stew. Use beans or vegetables instead of meat in common dishes like chili or lasagna. Experiment with different cooking methods. Try roasting or broiling vegetables instead of steaming or sauteing them. Add frozen vegetables to soups, stews, pasta, or rice. Add nuts or seeds for added healthy fat at each meal. You can add these to yogurt, salads, or vegetable dishes. Marinate fish or vegetables using olive oil, lemon juice, garlic, and fresh herbs. Meal planning  Plan to eat 1 vegetarian meal one day each week. Try to work up to 2 vegetarian meals, if possible. Eat seafood 2 or more times a week. Have healthy snacks readily available, such as: Vegetable sticks with hummus. Greek yogurt. Fruit and nut trail mix. Eat balanced meals throughout the week. This includes: Fruit: 2-3 servings a day Vegetables: 4-5 servings a day Low-fat dairy: 2 servings a day Fish, poultry, or lean meat: 1 serving a day Beans and legumes: 2 or more servings a week Nuts and seeds: 1-2 servings a day Whole grains: 6-8 servings a day Extra-virgin olive oil: 3-4 servings a day Limit red meat and sweets to only a few servings a month What are my food choices? Mediterranean diet Recommended Grains: Whole-grain pasta. Brown rice. Bulgar wheat. Polenta. Couscous. Whole-wheat bread. Dwyane Glad. Vegetables: Artichokes. Beets. Broccoli. Cabbage. Carrots. Eggplant. Green beans. Chard.  Kale. Spinach. Onions. Leeks. Peas. Squash. Tomatoes. Peppers. Radishes. Fruits: Apples. Apricots. Avocado. Berries. Bananas. Cherries. Dates. Figs. Grapes. Lemons. Melon. Oranges. Peaches. Plums. Pomegranate. Meats and other protein foods: Beans. Almonds. Sunflower seeds. Pine nuts. Peanuts. Cod. Salmon. Scallops. Shrimp. Tuna. Tilapia. Clams. Oysters. Eggs. Dairy: Low-fat milk. Cheese. Greek yogurt. Beverages: Water. Red wine. Herbal tea. Fats and oils: Extra virgin olive oil. Avocado oil. Grape seed oil. Sweets and desserts: [place] yogurt with honey. Baked apples. Poached pears. Trail mix. Seasoning and other foods: Basil. Cilantro. Coriander. Cumin. Mint. Parsley. Sage. Rosemary. Tarragon. Garlic. Oregano. Thyme. Pepper. Balsalmic vinegar. Tahini. Hummus. Tomato sauce. Olives. Mushrooms. Limit these Grains: Prepackaged pasta or rice dishes. Prepackaged cereal with added sugar. Vegetables: Deep fried potatoes (french fries). Fruits: Fruit canned in syrup. Meats and other protein foods: Beef. Pork. Lamb. Poultry with skin. Hot dogs. Helene Loader. Dairy: Ice cream. Sour cream. Whole milk. Beverages: Juice. Sugar-sweetened soft drinks. Beer. Liquor and spirits. Fats and oils: Butter. Canola oil. Vegetable oil. Beef fat (tallow). Lard. Sweets and desserts: Cookies. Cakes. Pies. Candy. Seasoning and other foods: Mayonnaise. Premade sauces and marinades. The items listed may not be a complete  list. Talk with your dietitian about what dietary choices are right for you. Summary The Mediterranean diet includes both food and lifestyle choices. Eat a variety of fresh fruits and vegetables, beans, nuts, seeds, and whole grains. Limit the amount of red meat and sweets that you eat. Talk with your health care provider about whether it is safe for you to drink red wine in moderation. This means 1 glass a day for nonpregnant women and 2 glasses a day for men. A glass of wine equals 5 oz (150 mL). This information  is not intended to replace advice given to you by your health care provider. Make sure you discuss any questions you have with your health care provider. Document Released: 02/24/2016 Document Revised: 03/28/2016 Document Reviewed: 02/24/2016 Elsevier Interactive Patient Education  2017 [employer].
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
## 2023-11-24 LAB — VITAMIN B12: Vitamin B-12: 370 pg/mL (ref 200–1100)

## 2023-11-24 LAB — TSH: TSH: 1.27 m[IU]/L (ref 0.40–4.50)

## 2023-12-06 ENCOUNTER — Telehealth: Payer: Self-pay

## 2023-12-06 NOTE — Telephone Encounter (Signed)
 I verified that Theresa Mendoza at [facility] received the referral they will reach out to patient to schedule. [phone] fax [phone]

## 2023-12-21 DIAGNOSIS — M47816 Spondylosis without myelopathy or radiculopathy, lumbar region: Secondary | ICD-10-CM | POA: Diagnosis not present

## 2023-12-21 DIAGNOSIS — M792 Neuralgia and neuritis, unspecified: Secondary | ICD-10-CM | POA: Diagnosis not present

## 2023-12-21 DIAGNOSIS — F1120 Opioid dependence, uncomplicated: Secondary | ICD-10-CM | POA: Diagnosis not present

## 2024-01-09 ENCOUNTER — Ambulatory Visit (INDEPENDENT_AMBULATORY_CARE_PROVIDER_SITE_OTHER): Admitting: Psychology

## 2024-01-09 ENCOUNTER — Ambulatory Visit: Payer: Self-pay

## 2024-01-09 ENCOUNTER — Encounter: Payer: Self-pay | Admitting: Psychology

## 2024-01-09 DIAGNOSIS — F319 Bipolar disorder, unspecified: Secondary | ICD-10-CM | POA: Diagnosis not present

## 2024-01-09 DIAGNOSIS — R4189 Other symptoms and signs involving cognitive functions and awareness: Secondary | ICD-10-CM

## 2024-01-09 NOTE — Progress Notes (Signed)
   Psychometrician Note   Cognitive testing was administered to Daaiyah W Caserta by Luke Pitcher, B.S. (psychometrist) under the supervision of Dr. Renda Beckwith, Psy.D., licensed psychologist on 01/09/2024. Ms. Detore did not appear overtly distressed by the testing session per behavioral observation or responses across self-report questionnaires. Rest breaks were offered.    The battery of tests administered was selected by Dr. Renda Beckwith, Psy.D. with consideration to Ms. Bahl's current level of functioning, the nature of her symptoms, emotional and behavioral responses during interview, level of literacy, observed level of motivation/effort, and the nature of the referral question. This battery was communicated to the psychometrist. Communication between Dr. Renda Beckwith, Psy.D. and the psychometrist was ongoing throughout the evaluation and Dr. Renda Beckwith, Psy.D. was immediately accessible at all times. Dr. Renda Beckwith, Psy.D. provided supervision to the psychometrist on the date of this service to the extent necessary to assure the quality of all services provided.    Sheritha Louis Retana will return within approximately 1-2 weeks for an interactive feedback session with Dr. Beckwith at which time her test performances, clinical impressions, and treatment recommendations will be reviewed in detail. Ms. Halberstadt understands she can contact our office should she require our assistance before this time.  A total of 115 minutes of billable time were spent face-to-face with Ms. Stenner by the psychometrist. This includes both test administration and scoring time. Billing for these services is reflected in the clinical report generated by Dr. Renda Beckwith, Psy.D.  This note reflects time spent with the psychometrician and does not include test scores or any clinical interpretations made by Dr. Beckwith. The full report will follow in a separate note.

## 2024-01-09 NOTE — Progress Notes (Addendum)
 NEUROPSYCHOLOGICAL EVALUATION [place]  [place] Department of Neurology  Date of Evaluation: 01/09/2024  REASON FOR REFERRAL   Theresa Mendoza is a 78 year old, right-handed, White female with 12 years of formal education. She was referred for neuropsychological evaluation by Camie Sevin, PA-C, to assess current neurocognitive functioning, document potential cognitive deficits, and assist with treatment planning. She previously underwent neuropsychological evaluation at [facility] with Stephania Fake, Psy.D. on 06/01&15/2017. Results from that evaluation were used as a baseline for comparison.  SUMMARY OF RESULTS   Premorbid cognitive abilities are estimated to be in the average range based on word reading and sociodemographic factors. Relative to this baseline estimate, performance today was broadly within age-related expectations, with only a few exceptions.  Specifically, the patient demonstrated difficulty with tasks of response inhibition, as well as a task of copying a detailed figure (e.g., missed key structural elements). All other performances were intact, including measures of attention/working memory, processing speed, abstract reasoning, alternating attention, language, visuospatial skills, and learning/memory.   On self-report questionnaires, she endorsed severe symptoms of depression and anxiety. She did not endorse excessive daytime sleepiness.  Compared to prior neuropsychological assessment in 2017, performance today is grossly stable. However, some executive functioning scores--including verbal abstract reasoning, phonemic fluency, and complex figure copy--although still within the normal range for her age, represent a relative decline compared to her previous testing. Notably, improvements were observed on verbal learning/memory tasks, particularly in encoding, recall, and recognition of short stories, as well as immediate and short  delayed recall of the word list.  DIAGNOSTIC IMPRESSION   Results of the current evaluation indicated broadly normal test performance, aside from a few weak scores on select executive functioning tasks. Performance today was largely consistent with the 2017 evaluation, though a mild decline was observed on some executive functioning measures, along with improvement in certain aspects of verbal learning/memory. While findings do not support a diagnosis of a cognitive disorder, there remains a significant discrepancy between the patient's cognitive test results and her reported functional abilities. Similar to conclusions from the prior evaluation, it appears that these difficulties are more likely attributable to untreated mental health factors rather than a primary neurologic etiology. Regardless of etiology, the patient would likely benefit from supportive interventions similar to those used for individuals with functional impairments due to neurologic conditions. Assisted living is recommended to provide the patient with consistent support and supervision and may also help ease some of the current strain in her relationship with her son. By offering her a structured environment and assistance with daily tasks, assisted living could reduce caregiving burdens and conflicts, allowing their relationship to improve while ensuring her safety and well-being. Her mood symptoms--including severe depression, anxiety, and intermittent suicidal ideation--warrant prompt and comprehensive treatment. Addressing these issues is critical, as they have significantly contributed to decreased social and cognitive stimulation, which further impact her overall functioning. A transition to a facility offering social activities and neighbor interactions, combined with psychiatric management--including medication adjustments and counseling--may lead to meaningful improvements in her mood and functional status.  ICD-10 Codes: R41.89  Cognitive changes; F31.9 Bipolar disorder, by history   RECOMMENDATIONS   A referral to clinical social work has been made to assist the patient in navigating mental health treatment and exploring appropriate living arrangements.  Prioritize comprehensive mental health treatment, including regular psychiatric follow-up for medication management and participation in counseling. Close monitoring for suicidal ideation is essential, with ongoing adjustments to her treatment plan to ensure safety  and support promote stabilization.  Patient is encouraged to prioritize smoking cessation given the implications to overall health, particularly in the context of vascular risk factors. The following websites provide a range of supportive resources that may assist in the quitting process.  [email or web address]  Consider referral for an updated vision examination due to reports of decreased vision.  Given ongoing sleep difficulties, she may benefit from the implementation of sleep hygiene techniques, including:  Go to bed and get up at the same time each day to help your body establish a regular rhythm. Establish and maintain a bedtime routine. Certain activities such as stretching, meditating, listening to soft music, or reading ~15 minutes before bedtime can be a great way to regularly get your brain and body ready for sleep. Avoid taking naps during the day. Avoid alcohol and caffeine for 5 or 6 hours before going to bed. Get regular exercise, but not in the hours before bedtime. Use comfortable bedding and maintain a cool temperature in your bedroom. Block out light and distracting noise. Avoid watching television or using your phone/computer in bed. Avoid staying in bed if you have difficulty falling asleep. If you have not been able to get to sleep after about 20 minutes or more, get up and do something calming or boring until you  feel sleepy, then return to bed and try again.  Aim to participate in activities that you find enjoyable and fulfilling, whether that be hobbies, socializing with loved ones, or being outdoors. This can improve mood, increase motivation, and offer cognitive stimulation.   Continue managing vascular risk factors through a heart healthy diet (e.g., MIND, Mediterranean), physician-approved physical activity, and medication adherence.   Consider implementing compensatory strategies to maximize independence and maintain daily functioning. Examples include:   -Adhere to routine. Compensatory strategies work best when they are used consistently. Use a planner, calendar, or white board that has the schedule and important events for the day clearly listed to reference and cross off when tasks are complete.   -Ask for written information, especially if it is new or unfamiliar (e.g., information provided at a doctor's appointment).   -Create an organized environment. Keep items that can be easily misplaced in a sensible location and get into the habit of always returning the items to those places.   -Pay attention and reduce distractions. Make a point of focusing attention on information you want to remember. One-on-one interaction is more likely to facilitate attention and minimize distraction. Make eye contact and repeat the information out loud after you hear it. Reduce interruptions or distractions especially when attempting to learn new information.   -Create associations. When learning something new, think about and understand the information. Explain it in your own words or try to associate it with something you already know. Take notes to help remember important details.  -Evaluate goals and plan accordingly. When confronted by many different tasks, begin by making a list that prioritizes each task and estimates the time it will take to complete. Break down complicated tasks into smaller, more  manageable steps.   -Focus on one task at a time and complete each task before starting another. Avoid multitasking.  DISPOSITION   Patient will follow up with the referring provider, Ms. Wertman. No follow-up neuropsychological testing was scheduled at this time. Please feel free to refer the patient for repeated evaluation if she shows a significant change in neurocognitive status. She and her son will be provided verbal feedback in approximately one week regarding  the findings and impression during this visit.  The remainder of the report includes the details of the patient's background and a table of results from the current evaluation, which support the summary and recommendations described above.  BACKGROUND   History of Presenting Illness: The following information was obtained from a review of medical records and an interview with the patient and her son, [name]. Patient initially established care at [facility] in 2017 for cognitive concerns and subsequently underwent neuropsychological evaluation (see below). She was then lost to follow-up until May 2025, when she was re-evaluated in the clinic for cognitive decline. Neurology felt she might benefit more from psychiatric care but decided to repeat the neuropsychological evaluation to assess for any objective change and to help determine any other underlying etiology. MoCA = 25/30.  Previous Neuropsychological Evaluation (06/01&15/2017):  Per Dr. Shelle, Results of cognitive testing were largely within normal limits.SABRASABRAShe did display diminished encoding/retrieval abilities on verbal memory tasks, but cueing and recognition format greatly assisted recall, arguing against a consolidation deficit and instead suggesting mild disruption of frontal-subcortical memory systems. These test results do not warrant a diagnosis of dementia. Additionally, these test results are not consistent with her current level of functioning (i.e., I would  expect much higher functioning based on these cognitive test results), and therefore suggest non-neurologic etiology of her cognitive difficulties in daily life. It is my impression that the patient's untreated bipolar disorder is likely causing her functional impairment, rather than a neurodegenerative process. Daily use of valium may also be a contributing factor. A diagnosis of mild cognitive impairment could be considered, but this would not account for the significant difficulties in her daily functioning.  Cognitive Functioning: Per prior neuropsychological evaluation in 2017, it was documented that cognitive concerns began around 2005-2006. Today, the patient's son described a "slow, steady decline" over the past several years. Patient reported ongoing difficulties with short-term memory, such as recalling names, but believes her long-term memory remains intact. Her son corroborated this, noting she may forget details of conversations or events from the previous day. Patient described her attention as variable. She does not report issues with processing speed or word-finding. Regarding navigation and executive functioning (e.g., planning, organizing), both the patient and her son stated it is difficult to assess any changes, as she no longer engages in daily tasks that would challenge these abilities.  Physical Functioning: Patient reported difficulty falling asleep, which she attributes to excessive daytime rest. She also experiences chronic back pain and is limited in the amount of pain medication she can take each day. Because she uses the medication during the day, she does not take it at night, and the resulting pain interferes with her sleep. Appetite is stable, and no changes in taste or smell were reported. She feels her vision has declined and noted a history of wearing glasses intermittently. Her son recalls she may have mentioned past vision correction surgery. She endorsed hearing loss, and  her son noted that she has recently become agreeable to undergoing a hearing evaluation and potentially using hearing aids. She continues to experience balance issues and falls. She is not currently participating in physical therapy. She denied tremor.  Emotional Functioning: Patient described feeling cross lately, noting that this is uncharacteristic for her but reflects her current emotional state. She also reported symptoms of anxiety and depression. She does not leave the house, has limited hobbies aside from occasionally reading and primarily watching television, and does not engage in physical activity. She endorsed  suicidal ideation and was unable to identify any protective factors. While she experiences suicidal thoughts, she denied having any imminent plan or means. No visual hallucinations were reported.  Imaging: MRI of the brain (12/29/2015) was unremarkable for acute intracranial abnormality.  Other Relevant Medical History: Remarkable for atrial fibrillation, coronary artery disease, h/o myocardial infarction, congestive heart failure, aortic stenosis, hypertension, hyperlipidemia, chronic obstructive pulmonary disease with chronic bronchitis and respiratory failure, type 2 diabetes, and chronic kidney disease stage III. Please refer to the medical record for a more comprehensive problem list. Patient has sustained multiple head injuries throughout her lifetime; however, none have resulted in significant symptoms such as loss of consciousness, brain hemorrhage, or persistent cognitive deficits. No history of stroke, CNS infection, or seizure was reported.  Current Medications: Per record, albuterol , apixaban , diltiazem , duloxetine , empagliflozin , furosemide , hydrocodone -acetaminophen , losartan , metformin , metoprolol , omeprazole, and pravastatin .   Functional Status: Patient stopped driving in 7982. Her son manages finances, medications, and meals. Patient is independent with basic  activities of daily living, although she reports decreased interest in personal hygiene, stating she "doesn't care" and never goes anywhere outside the house.  Family Neurological History: Remarkable for Alzheimer's disease in her mother (first showed signs in her early 57s).  Psychiatric History: Remarkable for bipolar disorder, currently managed with Cymbalta  prescribed by her psychiatrist. She reportedly does not attend psychiatry appointments and only receives prescription refills. She is not engaged in counseling. Per prior neuropsychological evaluation in 2017, the patient was diagnosed with manic depression in the early 1980s. Her son's description of manic and depressive episodes over the years is consistent with bipolar disorder. Manic episodes used to last several days and were characterized primarily by increased goal-directed activity, impulsive overspending, and lack of need for sleep.SABRASABRAThe patient also has a history of significant anxiety. In [place] , she often did not leave her house because of anxiety.SABRASABRAShe was treated with various mood stabilizers and antidepressants in the past but did not like the way they made her feel. She has been hospitalized in the past for suicidal ideation, in the 60s and once in the 1990s. She saw a psychiatrist regularly for many years. She also saw a psychologist in more recent years, which she felt was helpful. She has not taken mood stabilizers or antidepressants in several years, and she has not been under the care of any mental health provider in about a year.  Substance Use History: Patient continues to smoke cigarettes, averaging just under a pack per day. She has quit multiple times in the past following heart attacks or cardiac events but has since resumed smoking. She denies current use of alcohol, marijuana, or other illicit substances.  Social and Developmental History: Patient was born and raised in [place] . History of perinatal complications and  developmental delays was not reported. She is single (divorced x 3, widowed x 1). She has one son. She lives with her son and daughter-in-law.  Educational and Occupational History: No history of childhood learning disability, special education services, or grade retention was reported. 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 She completed high school on time. She worked on the [employer] at [employer] for 31 years. She is retired.  BEHAVIORAL OBSERVATIONS   Patient arrived on time and was accompanied by her son, [name]. She ambulated independently and without gait disturbance. She was alert and oriented with the exception of the exact date (i.e., one day off). She was appropriately groomed and dressed for the setting. No significant sensory or motor abnormalities were  observed. While vision and hearing were generally adequate for testing purposes, there were a few instances in which the patient had difficulty with visual material. Speech was of normal rate, prosody, and volume. No conversational word-finding difficulties, paraphasic errors, or dysarthria were observed. Comprehension was conversationally intact. Thought processes were linear, logical, and coherent. Thought content was organized and devoid of delusions. Insight appeared fair. Affect was labile. At one point, she became visibly upset with her son and left the visit. After reassurance and supportive intervention, she agreed to resume the clinical interview on the condition that her son not be present. One standalone performance validity measure was below the cutoff, possibly due to trouble seeing the stimuli. However, a second standalone measure and all embedded validity indicators were within normal limits. Given this overall pattern, results are considered to accurately reflect her cognitive functioning at this time.  NEUROPSYCHOLOGICAL TESTING RESULTS   Tests Administered: Animal Naming Test; [place]  Verbal Learning Test Third Edition  (CVLT3) - Brief Form; Controlled Oral Word Association Test (COWAT): FAS; Delis-Kaplan Executive Function System (D-KEFS) - Subtest(s): Color-Word Interference Test; Epworth Sleepiness Scale (ESS); Geriatric Anxiety Scale-10 Item (GAS-10); Geriatric Depression Scale Short Form (GDS-SF); Judgment of Line Orientation (JLO) - Form V; Neuropsychological Assessment Battery (NAB) - Subtest(s): Naming Form 1; Repeatable Battery for the Assessment of Neuropsychological Status Update (RBANS Update) Form A - Subtest(s): Figure Copy and Recall; Standalone performance validity tests (PVTs); Trail Making Test (TMT); Wechsler Adult Intelligence Scale Fifth Edition (WAIS-5) - Subtest(s): Similarities, Clinical cytogeneticist, Matrix Reasoning, Digit Sequencing, Coding, Running Digits, Symbol Search, Symbol Span; and Wechsler Memory Scale Fourth Edition (WMS-IV) - Subtest(s): Logical Memory (LM).  Test results are provided in the table below. Whenever possible, the patient's scores were compared against age-, sex-, and education-corrected normative samples. Interpretive descriptions are based on the AACN consensus conference statement on uniform labeling (Guilmette et al., 2020).  PREMORBID FUNCTIONING  RAW  RANGE  TOPF StdS=101 -- -- (Average)  ATTENTION & WORKING MEMORY  RAW  RANGE  WAIS-5 Digit Sequencing -- -- ss=10 Average  WAIS-5 Running Digits -- -- ss=8 Average  WAIS-5 Symbol Span -- -- ss=6 Low Average  WAIS-IV Digit Span ss=8 -- -- (Average)  WAIS-IV Arithmetic ss=10 -- -- (Average)  PROCESSING SPEED  RAW  RANGE  Trails A T=44 45''0e T=43 Average  WAIS-IV/WAIS-5 Coding  ss=6 -- ss=6 Low Average  WAIS-IV/WAIS-5 Symbol Search ss=7 -- ss=6 Low Average  DKEFS CWIT Color Naming -- 31''0e ss=11 Average  DKEFS CWIT Word Reading -- 24''0e ss=11 Average  EXECUTIVE FUNCTION  RAW  RANGE  Trails B T=41 161''1e T=39 Low Average  WAIS-IV/WAIS-5 Matrix Reasoning ss=6 -- ss=7 Low Average  WAIS-IV/WAIS-5 Similarities ss=10 --  ss=6 Low Average  COWAT Letter Fluency T=58 11+13+13 T=47 Average  DKEFS CWIT Inhibition -- 119''4e ss=3 Exceptionally Low  DKEFS CWIT Inhibition/Switching -- 129''8e ss=3 Exceptionally Low  LANGUAGE  RAW  RANGE  COWAT Letter Fluency T=58 11+13+13 T=47 Average  Animal Naming Test T=42 15 T=47 Average  NAB Naming Test -- 30/31 T=60 WNL  VISUOSPATIAL  RAW  RANGE  WAIS-5 Block Design -- -- ss=10 Average  JLO -- C/S=19/30 9%ile Low Average  VERBAL LEARNING & MEMORY  RAW  RANGE  CVLT3 Total 1-4 StdS=73 (4+5+7+8)/36 StdS=97 Average  CVLT3 SDFR  ss=6 7/9 ss=10 Average  CVLT3 LDFR  ss=7 5/9 ss=7 Low Average  CVLT3 LDCR  ss=10 6/9 ss=8 Average  CVLT3 Recognition Hits 8 9 ss=13 High Average  CVLT3 Recognition False+  1 2 ss=7 Low Average  CVLT3 Discriminability ss=9 -- ss=9 Average  CVLT3 Intrusions -- 1 ss=10 Average  CVLT3 Repetitions -- 0 ss=14 High Average  CVLT3 Forced Choice 9/9 9/9 -- WNL  WMS-IV LM-I  ss=8 (10+13+12)/53 ss=12 High Average  WMS-IV LM-II  ss=4 (12+12)/39 ss=13 High Average  WMS-IV LM Recognition  17-25%ile (6+13)/23 51-75%ile Average  VISUAL LEARNING & MEMORY  RAW  RANGE  RBANS Figure -- -- -- --  Figure Copy ss=12 15/20 ss=5 Below Average  Figure Recall ss=6 7/20 ss=6 Low Average  Figure Recognition -- 5/8 21-29%ile Low Average to Average  QUESTIONNAIRES  RAW  RANGE  GDS-SF 13 12 -- Severe  GAS-10 -- 21 -- Severe  ESS -- 4 -- WNL  *Note: ss = scaled score; StdS = standard score; T = t-score; C/S = corrected raw score; WNL = within normal limits; BNL= below normal limits; D/C = discontinued. Scores from skewed distributions are typically interpreted as WNL (>=16th %ile) or BNL (<16th %ile).   INFORMED CONSENT   Patient was provided with a verbal description of the nature and purpose of the neuropsychological evaluation. Also reviewed were the foreseeable risks and/or discomforts and benefits of the procedure, limits of confidentiality, and mandatory reporting  requirements of this provider. Patient was given the opportunity to have their questions answered. Oral consent to participate was provided by the patient.   This report was prepared as part of a clinical evaluation and is not intended for forensic use.  SERVICE   This evaluation was conducted by Renda Beckwith, Psy.D. In addition to time spent directly with the patient, total professional time (180 minutes) includes record review, integration of relevant medical history, test selection, interpretation of findings, and report preparation. A technician, Luke Pitcher, B.S., provided testing and scoring assistance for 115 minutes.  Psychiatric Diagnostic Evaluation Services (Professional): 09208 x 1 Neuropsychological Testing Evaluation Services (Professional): 03867 x 1 Neuropsychological Testing Evaluation Services (Professional): 03866 x 2 Neuropsychological Test Administration and Scoring (Technician): [phone] x 1 Neuropsychological Test Administration and Scoring (Technician): [phone] x 3  This report was generated using voice recognition software. While this document has been carefully reviewed, transcription errors may be present. I apologize in advance for any inconvenience. Please contact me if further clarification is needed.            Renda Beckwith, Psy.D.             Neuropsychologist

## 2024-01-10 ENCOUNTER — Telehealth: Payer: Self-pay | Admitting: *Deleted

## 2024-01-10 ENCOUNTER — Ambulatory Visit: Admitting: Nurse Practitioner

## 2024-01-10 NOTE — Progress Notes (Signed)
 Complex Care Management Note Care Guide Note  01/10/2024 Name: Theresa Mendoza MRN: [id number] DOB: 06/01/1946   Complex Care Management Outreach Attempts: An unsuccessful telephone outreach was attempted today to offer the patient information about available complex care management services.  Follow Up Plan:  Additional outreach attempts will be made to offer the patient complex care management information and services.   Encounter Outcome:  No Answer Primary Care Physician office [facility]  [facility]  [facility], [facility] Guide  Direct Dial: [phone]  Fax [phone]

## 2024-01-15 NOTE — Progress Notes (Unsigned)
 Complex Care Management Note Care Guide Note  01/15/2024 Name: Theresa Mendoza MRN: [id number] DOB: 08-25-1945   Complex Care Management Outreach Attempts: A second unsuccessful outreach was attempted today to offer the patient with information about available complex care management services.  Follow Up Plan:  Additional outreach attempts will be made to offer the patient complex care management information and services.   Encounter Outcome:  No Answer  Harlene Satterfield  [facility]  [facility] Guide  Direct Dial: [phone]  Fax [phone]

## 2024-01-16 ENCOUNTER — Ambulatory Visit (INDEPENDENT_AMBULATORY_CARE_PROVIDER_SITE_OTHER): Admitting: Psychology

## 2024-01-16 DIAGNOSIS — R4189 Other symptoms and signs involving cognitive functions and awareness: Secondary | ICD-10-CM

## 2024-01-16 DIAGNOSIS — F319 Bipolar disorder, unspecified: Secondary | ICD-10-CM | POA: Diagnosis not present

## 2024-01-16 NOTE — Progress Notes (Signed)
   NEUROPSYCHOLOGY FEEDBACK SESSION [place]. [facility]  [place] Department of Neurology  Date of Feedback Session: 01/16/2024  REASON FOR REFERRAL   Theresa Mendoza is a 78 year old, right-handed, White female with 12 years of formal education. She was referred for neuropsychological evaluation by Camie Sevin, PA-C, to assess current neurocognitive functioning, document potential cognitive deficits, and assist with treatment planning. She previously underwent neuropsychological evaluation at [facility] with Stephania Fake, Psy.D. on 06/01&15/2017. Results from that evaluation were used as a baseline for comparison.  FEEDBACK   Patient completed a comprehensive neuropsychological evaluation on 01/09/2024. Please refer to that encounter for the full report and recommendations. Briefly, results indicated broadly normal test performance, aside from a few weak scores on select executive functioning tasks. Performance today was largely consistent with the 2017 evaluation, though a mild decline was observed on some executive functioning measures, along with improvement in certain aspects of verbal learning/memory. While findings do not support a diagnosis of a cognitive disorder, there remains a significant discrepancy between the patient's cognitive test results and her reported functional abilities. Similar to conclusions from the prior evaluation, it appears that these difficulties are more likely attributable to untreated mental health factors rather than a primary neurologic etiology. Regardless of etiology, the patient would likely benefit from supportive interventions similar to those used for individuals with functional impairments due to neurologic conditions. Assisted living is recommended to provide the patient with consistent support and supervision and may also help ease some of the current strain in her relationship with her son. By offering her a structured  environment and assistance with daily tasks, assisted living could reduce caregiving burdens and conflicts, allowing their relationship to improve while ensuring her safety and well-being. Her mood symptoms--including severe depression, anxiety, and intermittent suicidal ideation--warrant prompt and comprehensive treatment. Addressing these issues is critical, as they have significantly contributed to decreased social and cognitive stimulation, which further impact her overall functioning. A transition to a facility offering social activities and neighbor interactions, combined with psychiatric management--including medication adjustments and counseling--may lead to meaningful improvements in her mood and functional status.   Today, the patient was accompanied by her son. They were provided verbal feedback regarding the findings and impression during this visit, and their questions were answered. A copy of the report was provided at the conclusion of the visit.  DISPOSITION   Patient will follow up with the referring provider, Ms. Wertman. No follow-up neuropsychological testing was scheduled at this time. Please feel free to refer the patient for repeated evaluation if she shows a significant change in neurocognitive status.  SERVICE   This feedback session was conducted by Renda Beckwith, Psy.D. [place] unit of 03867 (40 minutes) was billed for Dr. Beckwith' time spent in preparing, conducting, and documenting the current feedback session.  This report was generated using voice recognition software. While this document has been carefully reviewed, transcription errors may be present. I apologize in advance for any inconvenience. Please contact me if further clarification is needed.

## 2024-01-16 NOTE — Progress Notes (Signed)
 Complex Care Management Note  Care Guide Note 01/16/2024 Name: NASRA COUNCE MRN: [id number] DOB: 07/08/1946  Lakelynn Severtson Adebayo is a 78 y.o. year old female who sees Koirala, Dibas, MD for primary care. I reached out to Rock LELON Ringwald by phone today to offer complex care management services.  Ms. Lemke was given information about Complex Care Management services today including:   The Complex Care Management services include support from the care team which includes your Nurse Care Manager, Clinical Social Worker, or Pharmacist.  The Complex Care Management team is here to help remove barriers to the health concerns and goals most important to you. Complex Care Management services are voluntary, and the patient may decline or stop services at any time by request to their care team member.   Complex Care Management Consent Status: Patient agreed to services and verbal consent obtained.   Follow up plan:  Telephone appointment with complex care management team member scheduled for:  7/14  Encounter Outcome:  Patient Scheduled  Harlene Satterfield  [facility]  [facility], [facility] Guide  Direct Dial: [phone]  Fax [phone]

## 2024-01-24 ENCOUNTER — Encounter: Payer: Self-pay | Admitting: Physician Assistant

## 2024-01-24 ENCOUNTER — Ambulatory Visit: Admitting: Physician Assistant

## 2024-01-24 VITALS — BP 115/56 | HR 74 | Ht 64.0 in

## 2024-01-24 DIAGNOSIS — Z9151 Personal history of suicidal behavior: Secondary | ICD-10-CM | POA: Diagnosis not present

## 2024-01-24 DIAGNOSIS — G894 Chronic pain syndrome: Secondary | ICD-10-CM

## 2024-01-24 DIAGNOSIS — F17200 Nicotine dependence, unspecified, uncomplicated: Secondary | ICD-10-CM

## 2024-01-24 DIAGNOSIS — F4323 Adjustment disorder with mixed anxiety and depressed mood: Secondary | ICD-10-CM | POA: Diagnosis not present
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
## 2024-01-28 ENCOUNTER — Other Ambulatory Visit: Payer: Self-pay | Admitting: Licensed Clinical Social Worker

## 2024-01-28 NOTE — Patient Outreach (Signed)
 Complex Care Management   Visit Note  01/28/2024  Name:  Theresa Mendoza MRN: [id number] DOB: 11/29/1945  Situation: Referral received for Complex Care Management related to Mental/Behavioral Health diagnosis Cognitive and Behavioral Changes I obtained verbal consent from Caregiver Patient.  Visit completed with patient and caregiver  on the phone  Background:   Past Medical History:  Diagnosis Date   A-fib [facility])    Anxiety    Bipolar 1 disorder (HCC)    Bowel obstruction (HCC)    Cataract    Chronic atrial fibrillation (HCC) 09/2014   COPD (chronic obstructive pulmonary disease) (HCC)    Dyslipidemia, goal LDL below 70 12/25/2015   Encounter for tobacco use cessation counseling 12/04/2017   Glaucoma    Hx of migraine headaches    Memory loss    Mitral regurgitation    mild to moderate on echo 09/2014   Old MI (myocardial infarction) 09/2014   cath with normal coronary arteries and EF 50-55%   Osteoarthritis    Pulmonary HTN (HCC) 09/2014   Mild with PASP at cath   RBBB    Rheumatoid arthritis (HCC)    Tobacco abuse     Assessment: Patient Reported Symptoms:  Cognitive Cognitive Status: Alert and oriented to person, place, and time, Normal speech and language skills Cognitive/Intellectual Conditions Management [RPT]: None reported or documented in medical history or problem list   Health Maintenance Behaviors: Annual physical exam Healing Pattern: Unsure Health Facilitated by: Stress management  Neurological Neurological Review of Symptoms: No symptoms reported Neurological Comment: Assessed for cognitive deficit by Neurology - it was determine functioning deficit is more related to Mental Health concerns  HEENT HEENT Symptoms Reported: No symptoms reported      Cardiovascular Cardiovascular Symptoms Reported: No symptoms reported Cardiovascular Management Strategies: Activity, Coping strategies, Medication therapy, Routine screening Cardiovascular  Self-Management Outcome: 3 (uncertain)  Respiratory Respiratory Symptoms Reported: No symptoms reported    Endocrine Endocrine Symptoms Reported: Not assessed    Gastrointestinal Gastrointestinal Symptoms Reported: No symptoms reported      Genitourinary Genitourinary Symptoms Reported: No symptoms reported    Integumentary Integumentary Symptoms Reported: No symptoms reported    Musculoskeletal Musculoskelatal Symptoms Reviewed: No symptoms reported        Psychosocial Psychosocial Symptoms Reported: Depression - if selected complete PHQ 2-9 Additional Psychological Details: Pt was seen by [facility] this week, they are making a referral for in house counseling. Follow up appt with psychiatrist will be in October Behavioral Management Strategies: Activity, Counseling, Coping strategies, Medication therapy Behavioral Health Self-Management Outcome: 3 (uncertain) Behavioral Health Comment: Pt would like to have more socializing opporutnities - discussed this with ALF placement and community resources Environmental manager activity center) Major Change/Loss/Stressor/Fears (CP): Unknown cause Techniques to [employer] with Loss/Stress/Change: Medication, Diversional activities Quality of Family Relationships: helpful, involved, supportive Do you feel physically threatened by others?: No      01/24/2024    2:24 PM  Depression screen PHQ 2/9  Decreased Interest   Down, Depressed, Hopeless   PHQ - 2 Score      Information is confidential and restricted. Go to Review Flowsheets to unlock data.    There were no vitals filed for this visit.  Medications Reviewed Today     Reviewed by Kit Alm LABOR, LCSW (Social Worker) on 01/28/24 at 1513  Med List Status: <None>   Medication Order Taking? Sig Documenting Provider Last Dose Status Informant  albuterol  (VENTOLIN  HFA) 108 (90 Base) MCG/ACT inhaler [id number]  Inhale 2 puffs into the lungs every 4 (four) hours as needed for wheezing or  shortness of breath.  Patient not taking: Reported on 01/24/2024   Cheryle Page, MD  Active   apixaban  (ELIQUIS ) 5 MG TABS tablet [id number]  Take 1 tablet (5 mg total) by mouth 2 (two) times daily. NEEDS CARDIOLOGY APPT FOR ELIQUIS  REFILLS, PLEASE CALL OFFICE Turner, Wilbert SAUNDERS, MD  Active Self, Pharmacy Records, Family Member  diltiazem  (CARDIZEM  CD) 240 MG 24 hr capsule [id number]  Take 240 mg by mouth daily. [provider]  Active Self, Pharmacy Records, Family Member           Med Note CINDA, IVERSON CROME   Fri Dec 24, 2015 11:12 AM)    DULoxetine  (CYMBALTA ) 60 MG capsule [id number]  TAKE 2 CAPSULES BY MOUTH EVERY DAY Tasia Lung, MD  Active Self, Pharmacy Records, Family Member  empagliflozin  (JARDIANCE ) 10 MG TABS tablet [id number]  Take 1 tablet (10 mg total) by mouth daily before breakfast. Francesco Elsie NOVAK, MD  Active Self, Pharmacy Records, Family Member  furosemide  (LASIX ) 40 MG tablet [id number]  Take 0.5 tablets (20 mg total) by mouth every other day. Cheryle Page, MD  Active   HYDROcodone -acetaminophen  [facility]) 10-325 MG tablet [id number]  Take 1 tablet by mouth 5 (five) times daily as needed for moderate pain or severe pain. [provider]  Active Self, Pharmacy Records, Family Member  losartan  (COZAAR ) 25 MG tablet [id number]  TAKE 1 TABLET (25 MG TOTAL) BY MOUTH DAILY. Shlomo Wilbert SAUNDERS, MD  Active Self, Pharmacy Records, Family Member  metFORMIN  (GLUCOPHAGE ) 500 MG tablet [id number]  Take 1 tablet (500 mg total) by mouth 2 (two) times daily with a meal. Kathrin Simmer T, MD  Expired 01/24/24 2359   metoprolol  succinate (TOPROL -XL) 100 MG 24 hr tablet [id number]  Take 1 tablet (100 mg total) by mouth daily. Take with or immediately following a meal. Lelon, Glendia T, PA-C  Active   nitroGLYCERIN  (NITROSTAT ) 0.4 MG SL tablet [id number]  Place 1 tablet (0.4 mg total) under the tongue every 5 (five) minutes as needed for chest pain.  Patient not taking: Reported on 11/23/2023    Cheryle Page, MD  Active   omeprazole (PRILOSEC) 40 MG capsule [id number]  Take 40 mg by mouth in the morning. Before breakfast [provider]  Active Self, Pharmacy Records, Family Member  pravastatin  (PRAVACHOL ) 40 MG tablet [id number]  Take 1 tablet by mouth daily. [provider]  Active Self, Pharmacy Records, Family Member  umeclidinium-vilanterol (ANORO ELLIPTA ) 62.5-25 MCG/ACT AEPB [id number]  Inhale 1 puff into the lungs daily.  Patient not taking: Reported on 11/23/2023   Cheryle Page, MD  Active             Recommendation:   Review list of local ALFs and reach out to schedule initial tour and review finances. CSW reviewed pt's finances and she would not qualify for special assistance Medicaid - reviewed private pay options. Pt was seen at [facility] - they will be making internal referral for counseling as well.  Follow Up Plan:   Telephone follow up appointment date/time:  02/11/2024  Alm Armor, LCSW [facility], [facility] Licensed Clinical Social Worker Care Coordinator [phone]

## 2024-01-30 ENCOUNTER — Encounter: Payer: Self-pay | Admitting: Physician Assistant

## 2024-01-30 ENCOUNTER — Ambulatory Visit (INDEPENDENT_AMBULATORY_CARE_PROVIDER_SITE_OTHER): Admitting: Physician Assistant

## 2024-01-30 VITALS — BP 112/60 | HR 66 | Resp 20 | Ht 64.0 in

## 2024-01-30 DIAGNOSIS — R4189 Other symptoms and signs involving cognitive functions and awareness: Secondary | ICD-10-CM

## 2024-01-30 NOTE — Patient Instructions (Signed)
  No follow-up is indicated No indication for antidementia medication No follow-up neuropsych evaluation is indicated as per Dr. Reola notes Recommend good control of cardiovascular risk factors Replenish B12 Continue psychiatric follow-up and medications for bipolar disorder and MDD. Agree with assisted living for social and cognitive stimulation as well as for safety Discontinue tobacco use
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
## 2024-02-11 ENCOUNTER — Telehealth: Payer: Self-pay | Admitting: Licensed Clinical Social Worker

## 2024-02-11 DIAGNOSIS — J449 Chronic obstructive pulmonary disease, unspecified: Secondary | ICD-10-CM | POA: Diagnosis not present

## 2024-02-11 DIAGNOSIS — I5042 Chronic combined systolic (congestive) and diastolic (congestive) heart failure: Secondary | ICD-10-CM | POA: Diagnosis not present

## 2024-02-11 DIAGNOSIS — N1831 Chronic kidney disease, stage 3a: Secondary | ICD-10-CM | POA: Diagnosis not present

## 2024-02-11 DIAGNOSIS — E039 Hypothyroidism, unspecified: Secondary | ICD-10-CM | POA: Diagnosis not present

## 2024-02-11 DIAGNOSIS — D6869 Other thrombophilia: Secondary | ICD-10-CM | POA: Diagnosis not present

## 2024-02-11 DIAGNOSIS — J40 Bronchitis, not specified as acute or chronic: Secondary | ICD-10-CM | POA: Diagnosis not present

## 2024-02-11 DIAGNOSIS — G894 Chronic pain syndrome: Secondary | ICD-10-CM | POA: Diagnosis not present

## 2024-02-11 DIAGNOSIS — Z0000 Encounter for general adult medical examination without abnormal findings: Secondary | ICD-10-CM | POA: Diagnosis not present

## 2024-02-11 DIAGNOSIS — F332 Major depressive disorder, recurrent severe without psychotic features: Secondary | ICD-10-CM | POA: Diagnosis not present

## 2024-02-11 DIAGNOSIS — E782 Mixed hyperlipidemia: Secondary | ICD-10-CM | POA: Diagnosis not present

## 2024-02-11 DIAGNOSIS — F0390 Unspecified dementia without behavioral disturbance: Secondary | ICD-10-CM | POA: Diagnosis not present

## 2024-02-11 DIAGNOSIS — E1165 Type 2 diabetes mellitus with hyperglycemia: Secondary | ICD-10-CM | POA: Diagnosis not present

## 2024-02-11 LAB — HEMOGLOBIN A1C: Hemoglobin A1C: 6.7

## 2024-02-14 ENCOUNTER — Other Ambulatory Visit: Payer: Self-pay

## 2024-02-14 ENCOUNTER — Emergency Department (HOSPITAL_COMMUNITY)

## 2024-02-14 ENCOUNTER — Encounter (HOSPITAL_COMMUNITY): Payer: Self-pay | Admitting: Emergency Medicine

## 2024-02-14 ENCOUNTER — Inpatient Hospital Stay (HOSPITAL_COMMUNITY)
Admission: EM | Admit: 2024-02-14 | Discharge: 2024-02-20 | DRG: 309 | Disposition: A | Discharge status: Home / Self Care | Attending: Internal Medicine | Admitting: Internal Medicine

## 2024-02-14 DIAGNOSIS — N1831 Chronic kidney disease, stage 3a: Secondary | ICD-10-CM | POA: Diagnosis present

## 2024-02-14 DIAGNOSIS — E782 Mixed hyperlipidemia: Secondary | ICD-10-CM | POA: Diagnosis not present

## 2024-02-14 DIAGNOSIS — E1169 Type 2 diabetes mellitus with other specified complication: Secondary | ICD-10-CM | POA: Diagnosis present

## 2024-02-14 DIAGNOSIS — I272 Pulmonary hypertension, unspecified: Secondary | ICD-10-CM | POA: Diagnosis present

## 2024-02-14 DIAGNOSIS — Z888 Allergy status to other drugs, medicaments and biological substances status: Secondary | ICD-10-CM

## 2024-02-14 DIAGNOSIS — I130 Hypertensive heart and chronic kidney disease with heart failure and stage 1 through stage 4 chronic kidney disease, or unspecified chronic kidney disease: Secondary | ICD-10-CM | POA: Diagnosis present

## 2024-02-14 DIAGNOSIS — E1165 Type 2 diabetes mellitus with hyperglycemia: Secondary | ICD-10-CM | POA: Diagnosis not present

## 2024-02-14 DIAGNOSIS — I4510 Unspecified right bundle-branch block: Secondary | ICD-10-CM | POA: Diagnosis not present

## 2024-02-14 DIAGNOSIS — I4811 Longstanding persistent atrial fibrillation: Secondary | ICD-10-CM | POA: Diagnosis present

## 2024-02-14 DIAGNOSIS — E785 Hyperlipidemia, unspecified: Secondary | ICD-10-CM | POA: Diagnosis present

## 2024-02-14 DIAGNOSIS — M069 Rheumatoid arthritis, unspecified: Secondary | ICD-10-CM | POA: Diagnosis present

## 2024-02-14 DIAGNOSIS — R110 Nausea: Secondary | ICD-10-CM | POA: Diagnosis not present

## 2024-02-14 DIAGNOSIS — R0789 Other chest pain: Secondary | ICD-10-CM

## 2024-02-14 DIAGNOSIS — Z66 Do not resuscitate: Secondary | ICD-10-CM | POA: Diagnosis present

## 2024-02-14 DIAGNOSIS — R9431 Abnormal electrocardiogram [ECG] [EKG]: Secondary | ICD-10-CM | POA: Diagnosis not present

## 2024-02-14 DIAGNOSIS — I359 Nonrheumatic aortic valve disorder, unspecified: Secondary | ICD-10-CM

## 2024-02-14 DIAGNOSIS — I4821 Permanent atrial fibrillation: Secondary | ICD-10-CM | POA: Diagnosis not present

## 2024-02-14 DIAGNOSIS — Z831 Family history of other infectious and parasitic diseases: Secondary | ICD-10-CM

## 2024-02-14 DIAGNOSIS — E871 Hypo-osmolality and hyponatremia: Secondary | ICD-10-CM | POA: Diagnosis not present

## 2024-02-14 DIAGNOSIS — G8929 Other chronic pain: Secondary | ICD-10-CM | POA: Diagnosis present

## 2024-02-14 DIAGNOSIS — Z79899 Other long term (current) drug therapy: Secondary | ICD-10-CM

## 2024-02-14 DIAGNOSIS — I499 Cardiac arrhythmia, unspecified: Secondary | ICD-10-CM | POA: Diagnosis not present

## 2024-02-14 DIAGNOSIS — I2510 Atherosclerotic heart disease of native coronary artery without angina pectoris: Secondary | ICD-10-CM | POA: Diagnosis present

## 2024-02-14 DIAGNOSIS — R079 Chest pain, unspecified: Secondary | ICD-10-CM | POA: Diagnosis present

## 2024-02-14 DIAGNOSIS — Z90710 Acquired absence of both cervix and uterus: Secondary | ICD-10-CM

## 2024-02-14 DIAGNOSIS — F17210 Nicotine dependence, cigarettes, uncomplicated: Secondary | ICD-10-CM | POA: Diagnosis present

## 2024-02-14 DIAGNOSIS — I4891 Unspecified atrial fibrillation: Principal | ICD-10-CM | POA: Insufficient documentation

## 2024-02-14 DIAGNOSIS — J449 Chronic obstructive pulmonary disease, unspecified: Secondary | ICD-10-CM | POA: Diagnosis present

## 2024-02-14 DIAGNOSIS — I10 Essential (primary) hypertension: Secondary | ICD-10-CM | POA: Diagnosis present

## 2024-02-14 DIAGNOSIS — I5042 Chronic combined systolic (congestive) and diastolic (congestive) heart failure: Secondary | ICD-10-CM | POA: Diagnosis present

## 2024-02-14 DIAGNOSIS — F319 Bipolar disorder, unspecified: Secondary | ICD-10-CM | POA: Diagnosis present

## 2024-02-14 DIAGNOSIS — Z7984 Long term (current) use of oral hypoglycemic drugs: Secondary | ICD-10-CM

## 2024-02-14 DIAGNOSIS — Z801 Family history of malignant neoplasm of trachea, bronchus and lung: Secondary | ICD-10-CM

## 2024-02-14 DIAGNOSIS — Z820 Family history of epilepsy and other diseases of the nervous system: Secondary | ICD-10-CM

## 2024-02-14 DIAGNOSIS — Z818 Family history of other mental and behavioral disorders: Secondary | ICD-10-CM

## 2024-02-14 DIAGNOSIS — I252 Old myocardial infarction: Secondary | ICD-10-CM

## 2024-02-14 DIAGNOSIS — I509 Heart failure, unspecified: Secondary | ICD-10-CM

## 2024-02-14 DIAGNOSIS — Z9049 Acquired absence of other specified parts of digestive tract: Secondary | ICD-10-CM

## 2024-02-14 DIAGNOSIS — M1990 Unspecified osteoarthritis, unspecified site: Secondary | ICD-10-CM | POA: Diagnosis present

## 2024-02-14 DIAGNOSIS — I452 Bifascicular block: Secondary | ICD-10-CM | POA: Diagnosis present

## 2024-02-14 DIAGNOSIS — I080 Rheumatic disorders of both mitral and aortic valves: Secondary | ICD-10-CM | POA: Diagnosis present

## 2024-02-14 DIAGNOSIS — J441 Chronic obstructive pulmonary disease with (acute) exacerbation: Secondary | ICD-10-CM | POA: Diagnosis present

## 2024-02-14 DIAGNOSIS — Z79891 Long term (current) use of opiate analgesic: Secondary | ICD-10-CM

## 2024-02-14 DIAGNOSIS — Z7901 Long term (current) use of anticoagulants: Secondary | ICD-10-CM

## 2024-02-14 DIAGNOSIS — I4819 Other persistent atrial fibrillation: Secondary | ICD-10-CM

## 2024-02-14 DIAGNOSIS — R0602 Shortness of breath: Secondary | ICD-10-CM | POA: Diagnosis not present

## 2024-02-14 DIAGNOSIS — E1122 Type 2 diabetes mellitus with diabetic chronic kidney disease: Secondary | ICD-10-CM | POA: Diagnosis present

## 2024-02-14 DIAGNOSIS — H409 Unspecified glaucoma: Secondary | ICD-10-CM | POA: Diagnosis present

## 2024-02-14 LAB — CBC
HCT: 42.3 % (ref 36.0–46.0)
Hemoglobin: 12.9 g/dL (ref 12.0–15.0)
MCH: 22.7 pg — ABNORMAL LOW (ref 26.0–34.0)
MCHC: 30.5 g/dL (ref 30.0–36.0)
MCV: 74.5 fL — ABNORMAL LOW (ref 80.0–100.0)
Platelets: 373 K/uL (ref 150–400)
RBC: 5.68 MIL/uL — ABNORMAL HIGH (ref 3.87–5.11)
RDW: 19.1 % — ABNORMAL HIGH (ref 11.5–15.5)
WBC: 12.9 K/uL — ABNORMAL HIGH (ref 4.0–10.5)
nRBC: 0 % (ref 0.0–0.2)

## 2024-02-14 LAB — BASIC METABOLIC PANEL WITH GFR
Anion gap: 9 (ref 5–15)
BUN: 19 mg/dL (ref 8–23)
CO2: 21 mmol/L — ABNORMAL LOW (ref 22–32)
Calcium: 9.5 mg/dL (ref 8.9–10.3)
Chloride: 110 mmol/L (ref 98–111)
Creatinine, Ser: 1.04 mg/dL — ABNORMAL HIGH (ref 0.44–1.00)
GFR, Estimated: 56 mL/min — ABNORMAL LOW (ref >60)
Glucose, Bld: 92 mg/dL (ref 70–99)
Potassium: 4.1 mmol/L (ref 3.5–5.1)
Sodium: 140 mmol/L (ref 135–145)

## 2024-02-14 LAB — TROPONIN I (HIGH SENSITIVITY): Troponin I (High Sensitivity): 13 ng/L (ref <18)

## 2024-02-14 MED ORDER — APIXABAN 5 MG PO TABS
5.0000 mg | ORAL_TABLET | Freq: Two times a day (BID) | ORAL | Status: DC
  Administered 2024-02-15 – 2024-02-20 (×12): 5 mg via ORAL
  Filled 2024-02-14 (×12): qty 1

## 2024-02-14 MED ORDER — ACETAMINOPHEN 325 MG PO TABS: 650.0000 mg | ORAL_TABLET | Freq: Four times a day (QID) | ORAL | Status: DC | PRN

## 2024-02-14 MED ORDER — LEVALBUTEROL HCL 0.63 MG/3ML IN NEBU
0.6300 mg | INHALATION_SOLUTION | Freq: Once | RESPIRATORY_TRACT | Status: AC
  Administered 2024-02-15: 0.63 mg via RESPIRATORY_TRACT
  Filled 2024-02-14 (×2): qty 3

## 2024-02-14 MED ORDER — UMECLIDINIUM-VILANTEROL 62.5-25 MCG/ACT IN AEPB
1.0000 | INHALATION_SPRAY | Freq: Every day | RESPIRATORY_TRACT | Status: DC
  Administered 2024-02-16 – 2024-02-20 (×5): 1 via RESPIRATORY_TRACT
  Filled 2024-02-14: qty 14

## 2024-02-14 MED ORDER — UMECLIDINIUM-VILANTEROL 62.5-25 MCG/ACT IN AEPB: 1.0000 | INHALATION_SPRAY | Freq: Every day | RESPIRATORY_TRACT | Status: DC

## 2024-02-14 MED ORDER — DILTIAZEM LOAD VIA INFUSION
15.0000 mg | Freq: Once | INTRAVENOUS | Status: AC
  Administered 2024-02-14: 15 mg via INTRAVENOUS
  Filled 2024-02-14: qty 15

## 2024-02-14 MED ORDER — ACETAMINOPHEN 650 MG RE SUPP: 650.0000 mg | Freq: Four times a day (QID) | RECTAL | Status: DC | PRN

## 2024-02-14 MED ORDER — METOPROLOL SUCCINATE ER 100 MG PO TB24
100.0000 mg | ORAL_TABLET | Freq: Every day | ORAL | Status: DC
Start: 2024-02-15 — End: 2024-02-20
  Administered 2024-02-15 – 2024-02-20 (×6): 100 mg via ORAL
  Filled 2024-02-14 (×3): qty 1
  Filled 2024-02-14: qty 4
  Filled 2024-02-14 (×2): qty 1

## 2024-02-14 MED ORDER — DILTIAZEM HCL-DEXTROSE 125-5 MG/125ML-% IV SOLN (PREMIX)
5.0000 mg/h | INTRAVENOUS | Status: DC
  Administered 2024-02-14 (×3): 5 mg/h via INTRAVENOUS
  Administered 2024-02-15: 15 mg/h via INTRAVENOUS
  Administered 2024-02-15 – 2024-02-16 (×2): 10 mg/h via INTRAVENOUS
  Filled 2024-02-14 (×4): qty 125

## 2024-02-14 MED ORDER — LEVALBUTEROL HCL 0.63 MG/3ML IN NEBU: 0.6300 mg | INHALATION_SOLUTION | Freq: Four times a day (QID) | RESPIRATORY_TRACT | Status: DC | PRN

## 2024-02-14 MED ORDER — INSULIN ASPART 100 UNIT/ML IJ SOLN: 0.0000 [IU] | Freq: Every day | INTRAMUSCULAR | Status: DC

## 2024-02-14 MED ORDER — INSULIN ASPART 100 UNIT/ML IJ SOLN
0.0000 [IU] | Freq: Three times a day (TID) | INTRAMUSCULAR | Status: DC
  Administered 2024-02-17 (×2): 1 [IU] via SUBCUTANEOUS
  Administered 2024-02-17: 2 [IU] via SUBCUTANEOUS
  Administered 2024-02-18 (×2): 1 [IU] via SUBCUTANEOUS

## 2024-02-14 NOTE — ED Triage Notes (Signed)
 Patient here from PCP after experiencing shortness of breath and palpitations. Found to be in afib rvr in office. Given 25 mg cardizem  at 1620. 500 mL NS given by EMS. MD at bedside.

## 2024-02-14 NOTE — ED Provider Notes (Signed)
 [place] EMERGENCY DEPARTMENT AT [facility] Provider Note   CSN: [id number] Arrival date & time: 02/14/24  8366     Patient presents with: Irregular Heart Beat   Theresa Mendoza is a 78 y.o. female.   78 year old female with past medical history of atrial fibrillation on Eliquis  as well as hypertension and COPD as well as coronary artery disease presenting to the emergency department today with lightheadedness, fatigue, and chest pressure.  Patient states she has been feeling well now for the past 2 days.  States that she is having pressure in the center of her chest.  She also reports some dyspnea on exertion.  Reports some cough but denies any fever.  She denies a productive cough.  Denies any hemoptysis.  She came to the ER today for further evaluation regarding this due to ongoing symptoms after going to see her primary care doctor who sent her in for further evaluation.        Prior to Admission medications   Medication Sig Start Date End Date Taking? Authorizing Provider  albuterol  (VENTOLIN  HFA) 108 (90 Base) MCG/ACT inhaler Inhale 2 puffs into the lungs every 4 (four) hours as needed for wheezing or shortness of breath. 09/24/23   Cheryle Page, MD  apixaban  (ELIQUIS ) 5 MG TABS tablet Take 1 tablet (5 mg total) by mouth 2 (two) times daily. NEEDS CARDIOLOGY APPT FOR ELIQUIS  REFILLS, PLEASE CALL OFFICE 01/19/23   Shlomo Wilbert SAUNDERS, MD  diltiazem  (CARDIZEM  CD) 240 MG 24 hr capsule Take 240 mg by mouth daily. 11/01/15   [provider]  DULoxetine  (CYMBALTA ) 60 MG capsule TAKE 2 CAPSULES BY MOUTH EVERY DAY 10/09/19   Plovsky, Elna, MD  empagliflozin  (JARDIANCE ) 10 MG TABS tablet Take 1 tablet (10 mg total) by mouth daily before breakfast. 10/19/20   Francesco Elsie NOVAK, MD  furosemide  (LASIX ) 40 MG tablet Take 0.5 tablets (20 mg total) by mouth every other day. 09/24/23   Cheryle Page, MD  HYDROcodone -acetaminophen  (NORCO) 10-325 MG tablet Take 1 tablet by mouth 5  (five) times daily as needed for moderate pain or severe pain.    [provider]  losartan  (COZAAR ) 25 MG tablet TAKE 1 TABLET (25 MG TOTAL) BY MOUTH DAILY. 04/13/22   Shlomo Wilbert SAUNDERS, MD  metFORMIN  (GLUCOPHAGE ) 500 MG tablet Take 1 tablet (500 mg total) by mouth 2 (two) times daily with a meal. 10/15/21 01/30/24  Kathrin Mignon DASEN, MD  metoprolol  succinate (TOPROL -XL) 100 MG 24 hr tablet Take 1 tablet (100 mg total) by mouth daily. Take with or immediately following a meal. 11/21/23 02/19/24  Lelon Hamilton T, PA-C  nitroGLYCERIN  (NITROSTAT ) 0.4 MG SL tablet Place 1 tablet (0.4 mg total) under the tongue every 5 (five) minutes as needed for chest pain. 09/24/23   Cheryle Page, MD  omeprazole (PRILOSEC) 40 MG capsule Take 40 mg by mouth in the morning. Before breakfast 09/07/20   [provider]  pravastatin  (PRAVACHOL ) 40 MG tablet Take 1 tablet by mouth daily. 03/17/21   [provider]  umeclidinium-vilanterol (ANORO ELLIPTA ) 62.5-25 MCG/ACT AEPB Inhale 1 puff into the lungs daily. 09/24/23   Cheryle Page, MD    Allergies: Lithium and Carbamazepine    Review of Systems  Respiratory:  Positive for chest tightness and shortness of breath.   Neurological:  Positive for light-headedness.  All other systems reviewed and are negative.   Updated Vital Signs BP (!) 142/116   Pulse (!) 115   Temp 98.7 F (  37.1 C) (Oral)   Resp (!) 24   Ht 5' 5 (1.651 m)   Wt 70.8 kg   SpO2 100%   BMI 25.96 kg/m   Physical Exam Vitals and nursing note reviewed.   Gen: NAD Eyes: PERRL, EOMI HEENT: no oropharyngeal swelling Neck: trachea midline Resp: clear to auscultation bilaterally Card: Tachycardic, irregular, no murmurs, rubs, or gallops Abd: nontender, nondistended Extremities: no calf tenderness, no edema Vascular: 2+ radial pulses bilaterally, 2+ DP pulses bilaterally Skin: no rashes Psyc: acting appropriately   (all labs ordered are listed, but only abnormal results are  displayed) Labs Reviewed  BASIC METABOLIC PANEL WITH GFR - Abnormal; Notable for the following components:      Result Value   CO2 21 (*)    Creatinine, Ser 1.04 (*)    GFR, Estimated 56 (*)    All other components within normal limits  CBC - Abnormal; Notable for the following components:   WBC 12.9 (*)    RBC 5.68 (*)    MCV 74.5 (*)    MCH 22.7 (*)    RDW 19.1 (*)    All other components within normal limits  TROPONIN I (HIGH SENSITIVITY)  TROPONIN I (HIGH SENSITIVITY)    EKG: None  Radiology: [facility] Chest Port 1 View Result Date: 02/14/2024 CLINICAL DATA:  Chest pain. EXAM: PORTABLE CHEST 1 VIEW COMPARISON:  September 22, 2023. FINDINGS: The heart size and mediastinal contours are within normal limits. Both lungs are clear. The visualized skeletal structures are unremarkable. IMPRESSION: No active disease. Electronically Signed   By: Lynwood Landy Raddle M.D.   On: 02/14/2024 17:15     Procedures   Medications Ordered in the ED  diltiazem  (CARDIZEM ) 1 mg/mL load via infusion 15 mg (15 mg Intravenous Bolus from Bag 02/14/24 1850)    And  diltiazem  (CARDIZEM ) 125 mg in dextrose  5% 125 mL (1 mg/mL) infusion (5 mg/hr Intravenous New Bag/Given 02/14/24 1906)                                    Medical Decision Making 78 year old female with past medical history of coronary artery disease, COPD, and atrial fibrillation presenting to the emergency department today with chest pressure, lightheadedness, and dyspnea on exertion.  I will further evaluate the patient here with basic labs smalls and EKG, chest x-ray, and troponin for further evaluation for ACS, pulmonary edema, pulmonary infiltrates, or pneumothorax.  Patient did receive Cardizem  with improvement in her rate.  Will give her additional Cardizem  bolus and infusion here.  Her initial EKG did show some mild ST elevations in the inferior leads.  I did call and discussed this with Dr. Colan who reviewed her EKG and thinks that this does  look relatively similar to previous and we will hold off on calling a STEMI at this time.  I will further evaluate her here and reevaluate but she will likely require admission.  On reassessment the patient's heart rate has improved to the 110's on the Cardizem  infusion.  Patient's heart rate remained elevated.  Case is discussed with cardiology.  The patient is in permanent atrial fibrillation so they recommend against cardioversion.  Recommend hospitalist admission and they will follow.  Calls placed hospital service for admission.  CRITICAL CARE Performed by: Prentice JONELLE Medicus   Total critical care time: 35 minutes  Critical care time was exclusive of separately billable procedures and treating other  patients.  Critical care was necessary to treat or prevent imminent or life-threatening deterioration.  Critical care was time spent personally by me on the following activities: development of treatment plan with patient and/or surrogate as well as nursing, discussions with consultants, evaluation of patient's response to treatment, examination of patient, obtaining history from patient or surrogate, ordering and performing treatments and interventions, ordering and review of laboratory studies, ordering and review of radiographic studies, pulse oximetry and re-evaluation of patient's condition.   Amount and/or Complexity of Data Reviewed Labs: ordered. Radiology: ordered.  Risk Prescription drug management. Decision regarding hospitalization.        Final diagnoses:  Atrial fibrillation with rapid ventricular response (HCC)  Chest pressure    ED Discharge Orders     None          Ula Prentice SAUNDERS, MD 02/14/24 2155

## 2024-02-14 NOTE — H&P (Signed)
 History and Physical    Theresa Mendoza FMW:[id number] DOB: February 22, 1946 DOA: 02/14/2024  PCP: Regino Slater, MD  Patient coming from: Home  Chief Complaint: Chest pain  HPI: Theresa Mendoza is a 78 y.o. female with medical history significant of permanent A-fib on Eliquis , moderate AS, mild MS, pulmonary hypertension, RBBB, hypertension, hyperlipidemia, CKD, HFpEF, rheumatoid arthritis, anxiety, depression, bipolar disorder, COPD, glaucoma, migraine headaches, mild cognitive impairment, type 2 diabetes, chronic back pain presenting with a chief complaint of chest pain.  Sent to the ED by PCP and did receive Cardizem  25 mg in the office.  She was given 500 mL normal saline by EMS.  Patient states she is having substernal chest pain, palpitations, nausea, and shortness of breath for the past few days.  She reports chronic cough related to COPD.  She is not sure which medications she takes at home as her son gives them to her.  He does not believe that she has run out of any of her home medications although does mention that she does not use any inhalers at home.  She reports improvement of her chest pain.  Not able to give any additional history.  ED Course: Noted to be tachycardic to the 160s but afebrile and not hypotensive or hypoxic.  EKG showing A-fib with RVR, bifascicular block with RBBB and LAFB.  EKG did show some ST elevations in inferior leads and was reviewed with cardiologist who did not feel that it met STEMI criteria.  Labs notable for WBC count 12.9 (mildly elevated on previous labs as well), bicarb 21, creatinine 1.0 (stable), initial troponin negative and repeat pending.  Chest x-ray showing no active disease.  Patient was given IV Cardizem  bolus and started on continuous infusion.  Cardiology consulted.  Review of Systems:  Review of Systems  All other systems reviewed and are negative.   Past Medical History:  Diagnosis Date   A-fib [facility])    Anxiety    Bipolar 1  disorder (HCC)    Bowel obstruction (HCC)    Cataract    Chronic atrial fibrillation (HCC) 09/2014   COPD (chronic obstructive pulmonary disease) (HCC)    Dyslipidemia, goal LDL below 70 12/25/2015   Encounter for tobacco use cessation counseling 12/04/2017   Glaucoma    Hx of migraine headaches    Memory loss    Mitral regurgitation    mild to moderate on echo 09/2014   Old MI (myocardial infarction) 09/2014   cath with normal coronary arteries and EF 50-55%   Osteoarthritis    Pulmonary HTN (HCC) 09/2014   Mild with PASP at cath   RBBB    Rheumatoid arthritis (HCC)    Tobacco abuse     Past Surgical History:  Procedure Laterality Date   ADENOIDECTOMY     BACK SURGERY     bowel obstruction     CARDIAC CATHETERIZATION  09/2014   normal coronary arteries with mild pulmonary HTN, low normal LVF   CHOLECYSTECTOMY     NOSE SURGERY     TONSILLECTOMY     TRANSESOPHAGEAL ECHOCARDIOGRAM WITH CARDIOVERSION  09/2014   VAGINAL HYSTERECTOMY     with BSO     reports that she has been smoking cigarettes. She has a 60 pack-year smoking history. She has been exposed to tobacco smoke. She has never used smokeless tobacco. She reports that she does not drink alcohol and does not use drugs.  Allergies  Allergen Reactions   Lithium  Patient became suicidal   Carbamazepine Other (See Comments)    UNK reaction  Other Reaction(s): melted her skin    Family History  Problem Relation Age of Onset   Alzheimer's disease Mother    Lung cancer Mother    Tuberculosis Father    Depression Father     Prior to Admission medications   Medication Sig Start Date End Date Taking? Authorizing Provider  albuterol  (VENTOLIN  HFA) 108 (90 Base) MCG/ACT inhaler Inhale 2 puffs into the lungs every 4 (four) hours as needed for wheezing or shortness of breath. 09/24/23   Cheryle Page, MD  apixaban  (ELIQUIS ) 5 MG TABS tablet Take 1 tablet (5 mg total) by mouth 2 (two) times daily. NEEDS  CARDIOLOGY APPT FOR ELIQUIS  REFILLS, PLEASE CALL OFFICE 01/19/23   Shlomo Wilbert SAUNDERS, MD  diltiazem  (CARDIZEM  CD) 240 MG 24 hr capsule Take 240 mg by mouth daily. 11/01/15   [provider]  DULoxetine  (CYMBALTA ) 60 MG capsule TAKE 2 CAPSULES BY MOUTH EVERY DAY 10/09/19   Plovsky, Elna, MD  empagliflozin  (JARDIANCE ) 10 MG TABS tablet Take 1 tablet (10 mg total) by mouth daily before breakfast. 10/19/20   Francesco Elsie NOVAK, MD  furosemide  (LASIX ) 40 MG tablet Take 0.5 tablets (20 mg total) by mouth every other day. 09/24/23   Cheryle Page, MD  HYDROcodone -acetaminophen  (NORCO) 10-325 MG tablet Take 1 tablet by mouth 5 (five) times daily as needed for moderate pain or severe pain.    [provider]  losartan  (COZAAR ) 25 MG tablet TAKE 1 TABLET (25 MG TOTAL) BY MOUTH DAILY. 04/13/22   Shlomo Wilbert SAUNDERS, MD  metFORMIN  (GLUCOPHAGE ) 500 MG tablet Take 1 tablet (500 mg total) by mouth 2 (two) times daily with a meal. 10/15/21 01/30/24  Kathrin Mignon DASEN, MD  metoprolol  succinate (TOPROL -XL) 100 MG 24 hr tablet Take 1 tablet (100 mg total) by mouth daily. Take with or immediately following a meal. 11/21/23 02/19/24  Lelon Hamilton T, PA-C  nitroGLYCERIN  (NITROSTAT ) 0.4 MG SL tablet Place 1 tablet (0.4 mg total) under the tongue every 5 (five) minutes as needed for chest pain. 09/24/23   Cheryle Page, MD  omeprazole (PRILOSEC) 40 MG capsule Take 40 mg by mouth in the morning. Before breakfast 09/07/20   [provider]  pravastatin  (PRAVACHOL ) 40 MG tablet Take 1 tablet by mouth daily. 03/17/21   [provider]  umeclidinium-vilanterol (ANORO ELLIPTA ) 62.5-25 MCG/ACT AEPB Inhale 1 puff into the lungs daily. 09/24/23   Cheryle Page, MD    Physical Exam: Vitals:   02/14/24 1840 02/14/24 1900 02/14/24 1915 02/14/24 2115  BP: 119/60 (!) 156/121 (!) 142/116   Pulse:  (!) 132 (!) 115   Resp: (!) 27 (!) 26 (!) 24   Temp:    98.7 F (37.1 C)  TempSrc:    Oral  SpO2:  99% 100%   Weight:       Height:        Physical Exam Vitals reviewed.  Constitutional:      General: She is not in acute distress. HENT:     Head: Normocephalic and atraumatic.  Eyes:     Extraocular Movements: Extraocular movements intact.  Cardiovascular:     Rate and Rhythm: Tachycardia present. Rhythm irregular.     Pulses: Normal pulses.  Pulmonary:     Effort: No respiratory distress.     Breath sounds: Wheezing present.     Comments: Mild diffuse end expiratory wheezing Abdominal:     General:  Bowel sounds are normal. There is no distension.     Palpations: Abdomen is soft.     Tenderness: There is no abdominal tenderness. There is no guarding.  Musculoskeletal:     Cervical back: Normal range of motion.     Right lower leg: No edema.     Left lower leg: No edema.  Skin:    General: Skin is warm and dry.  Neurological:     General: No focal deficit present.     Mental Status: She is alert and oriented to person, place, and time.     Labs on Admission: I have personally reviewed following labs and imaging studies  CBC: Recent Labs  Lab 02/14/24 1746  WBC 12.9*  HGB 12.9  HCT 42.3  MCV 74.5*  PLT 373   Basic Metabolic Panel: Recent Labs  Lab 02/14/24 1746  NA 140  K 4.1  CL 110  CO2 21*  GLUCOSE 92  BUN 19  CREATININE 1.04*  CALCIUM 9.5   GFR: Estimated Creatinine Clearance: 45.4 mL/min (A) (by C-G formula based on SCr of 1.04 mg/dL (H)). Liver Function Tests: No results for input(s): AST, ALT, ALKPHOS, BILITOT, PROT, ALBUMIN in the last 168 hours. No results for input(s): LIPASE, AMYLASE in the last 168 hours. No results for input(s): AMMONIA in the last 168 hours. Coagulation Profile: No results for input(s): INR, PROTIME in the last 168 hours. Cardiac Enzymes: No results for input(s): CKTOTAL, CKMB, CKMBINDEX, TROPONINI in the last 168 hours. BNP (last 3 results) No results for input(s): PROBNP in the last 8760 hours. HbA1C: No  results for input(s): HGBA1C in the last 72 hours. CBG: No results for input(s): GLUCAP in the last 168 hours. Lipid Profile: No results for input(s): CHOL, HDL, LDLCALC, TRIG, CHOLHDL, LDLDIRECT in the last 72 hours. Thyroid  Function Tests: No results for input(s): TSH, T4TOTAL, FREET4, T3FREE, THYROIDAB in the last 72 hours. Anemia Panel: No results for input(s): VITAMINB12, FOLATE, FERRITIN, TIBC, IRON, RETICCTPCT in the last 72 hours. Urine analysis: No results found for: COLORURINE, APPEARANCEUR, LABSPEC, PHURINE, GLUCOSEU, HGBUR, BILIRUBINUR, KETONESUR, PROTEINUR, UROBILINOGEN, NITRITE, LEUKOCYTESUR  Radiological Exams on Admission: DG Chest Port 1 View Result Date: 02/14/2024 CLINICAL DATA:  Chest pain. EXAM: PORTABLE CHEST 1 VIEW COMPARISON:  September 22, 2023. FINDINGS: The heart size and mediastinal contours are within normal limits. Both lungs are clear. The visualized skeletal structures are unremarkable. IMPRESSION: No active disease. Electronically Signed   By: Lynwood Landy Raddle M.D.   On: 02/14/2024 17:15    Assessment and Plan  Permanent A-fib with RVR TSH normal on 11/23/2023.  Cardiology feels this is related to patient's underlying lung pathology.  Continue Cardizem  drip, metoprolol  100 mg daily, and Eliquis  5 mg twice daily.  Continue cardiac monitoring.  Appreciate cardiology recommendations.  Chest pain Likely related to A-fib with RVR.  Initial troponin negative and repeat pending.  Cardiology consulting.  PE less likely given no hypoxia.  Chronic CHF and valvular disease Last echo done in March 2025 showing improvement of EF to 60 to 65% (from 40-45% 3 years ago), moderately elevated pulmonary artery systolic pressure, mild biatrial dilation, mild mitral stenosis, mild aortic regurgitation, and moderate aortic stenosis.  Cardiology feels the patient is currently not volume overloaded.  Continue home Jardiance ,  Lasix , and losartan  after pharmacy med rec is done.  Check BNP.  COPD She has mild wheezing on exam but not hypoxic.  Patient confirms that she is not using any inhalers at home.  Resume home Anoro Ellipta , Xopenex  neb PRN.  Hypertension Continue Cardizem  drip and metoprolol .  Type 2 diabetes Last A1c 7.6 in March 2025.  Placed on sensitive sliding scale insulin  ACHS.  Hyperlipidemia Rheumatoid arthritis Anxiety/depression/bipolar disorder Glaucoma Pharmacy med rec pending.  DVT prophylaxis: Eliquis  Code Status: DNR/DNI (discussed with the patient) Family Communication: No family available at this time. Consults called: Cardiology Level of care: Progressive Care Unit Admission status: It is my clinical opinion that referral for OBSERVATION is reasonable and necessary in this patient based on the above information provided. The aforementioned taken together are felt to place the patient at high risk for further clinical deterioration. However, it is anticipated that the patient may be medically stable for discharge from the hospital within 24 to 48 hours.  Editha Ram MD [facility]  If 7PM-7AM, please contact night-coverage [email or web address]  02/14/2024, 10:07 PM

## 2024-02-14 NOTE — ED Notes (Signed)
 No labs done patient states she feels better and wants to go home.

## 2024-02-14 NOTE — ED Notes (Signed)
 PT's hand IV is infiltrated and PT's right forearm IV was removed by PT. This RN will attempt to regain new access.

## 2024-02-14 NOTE — Consult Note (Signed)
 Cardiology Consultation   Patient ID: Theresa Mendoza MRN: [id number]; DOB: [birth year]  Admit date: 02/14/2024 Date of Consult: 02/14/2024  PCP:  Regino Slater, MD   [facility] Providers Cardiologist:  Wilbert Bihari, MD        Patient Profile: Theresa Mendoza is a 78 y.o. female with a hx of mild cognitive impairment, COPD, ongoing tobacco use, HTN, HLD, CKD, type 2 diabetes, heart failure with improved ejection fraction (most recently 60%), moderate AS, mild MS, permanent A-fib on Eliquis , who is being seen 02/14/2024 for the evaluation of chest pain at the request of emergency department.  History of Present Illness: Theresa Mendoza is accompanied by her son in the emergency department.  She has trouble remembering her symptoms over the last week but per her and her son sounds like she was having slightly more shortness of breath and increased cough for which she saw the PCP and was put on antibiotics given concern for possible infection/COPD exacerbation.  Developed nausea on antibiotics and presented to the PCP for follow-up for monitoring of treatment response and was noted to be in A-fib with RVR with rates to the 160s so he sent to the emergency department.  She denies prior chest pain before today.  Her chest pain is now resolved.  She states she has a history of chest pain that she thinks is related to her COPD and A-fib.  She is on Eliquis , did not take her morning dose.  She is also on diltiazem  240 mg daily and metoprolol  100 mg daily for rate control.  She was previously on dig but this was stopped due to slow ventricular response.  She also takes Jardiance , losartan , pravastatin .  Does not use regular inhalers for COPD.  She had previous ischemic workup back in 2016 with normal catheterization.  Normal nuclear stress in 2017.  Recent transthoracic echocardiogram demonstrated EF 60 to 65%, normal RV function, moderate AS with AVA 1.36, mean gradient 33.  In the ED,  she is hemodynamically stable and asymptomatic.  Heart rates are still in the 130s on 5 mg IV diltiazem .  Labs were notable for WBC 12.9, creatinine 1.04, troponin initial 13.  EKG with A-fib with RVR, bifascicular block with RBBB and LAFB.  Past Medical History:  Diagnosis Date   A-fib [facility])    Anxiety    Bipolar 1 disorder (HCC)    Bowel obstruction (HCC)    Cataract    Chronic atrial fibrillation (HCC) 09/2014   COPD (chronic obstructive pulmonary disease) (HCC)    Dyslipidemia, goal LDL below 70 12/25/2015   Encounter for tobacco use cessation counseling 12/04/2017   Glaucoma    Hx of migraine headaches    Memory loss    Mitral regurgitation    mild to moderate on echo 09/2014   Old MI (myocardial infarction) 09/2014   cath with normal coronary arteries and EF 50-55%   Osteoarthritis    Pulmonary HTN (HCC) 09/2014   Mild with PASP at cath   RBBB    Rheumatoid arthritis (HCC)    Tobacco abuse     Past Surgical History:  Procedure Laterality Date   ADENOIDECTOMY     BACK SURGERY     bowel obstruction     CARDIAC CATHETERIZATION  09/2014   normal coronary arteries with mild pulmonary HTN, low normal LVF   CHOLECYSTECTOMY     NOSE SURGERY     TONSILLECTOMY     TRANSESOPHAGEAL ECHOCARDIOGRAM WITH CARDIOVERSION  09/2014  VAGINAL HYSTERECTOMY     with BSO     Home Medications:  Prior to Admission medications   Medication Sig Start Date End Date Taking? Authorizing Provider  albuterol  (VENTOLIN  HFA) 108 (90 Base) MCG/ACT inhaler Inhale 2 puffs into the lungs every 4 (four) hours as needed for wheezing or shortness of breath. 09/24/23   Cheryle Page, MD  apixaban  (ELIQUIS ) 5 MG TABS tablet Take 1 tablet (5 mg total) by mouth 2 (two) times daily. NEEDS CARDIOLOGY APPT FOR ELIQUIS  REFILLS, PLEASE CALL OFFICE 01/19/23   Shlomo Wilbert SAUNDERS, MD  diltiazem  (CARDIZEM  CD) 240 MG 24 hr capsule Take 240 mg by mouth daily. 11/01/15   [provider]  DULoxetine  (CYMBALTA ) 60  MG capsule TAKE 2 CAPSULES BY MOUTH EVERY DAY 10/09/19   Plovsky, Elna, MD  empagliflozin  (JARDIANCE ) 10 MG TABS tablet Take 1 tablet (10 mg total) by mouth daily before breakfast. 10/19/20   Francesco Elsie NOVAK, MD  furosemide  (LASIX ) 40 MG tablet Take 0.5 tablets (20 mg total) by mouth every other day. 09/24/23   Cheryle Page, MD  HYDROcodone -acetaminophen  (NORCO) 10-325 MG tablet Take 1 tablet by mouth 5 (five) times daily as needed for moderate pain or severe pain.    [provider]  losartan  (COZAAR ) 25 MG tablet TAKE 1 TABLET (25 MG TOTAL) BY MOUTH DAILY. 04/13/22   Shlomo Wilbert SAUNDERS, MD  metFORMIN  (GLUCOPHAGE ) 500 MG tablet Take 1 tablet (500 mg total) by mouth 2 (two) times daily with a meal. 10/15/21 01/30/24  Kathrin Mignon DASEN, MD  metoprolol  succinate (TOPROL -XL) 100 MG 24 hr tablet Take 1 tablet (100 mg total) by mouth daily. Take with or immediately following a meal. 11/21/23 02/19/24  Lelon Hamilton T, PA-C  nitroGLYCERIN  (NITROSTAT ) 0.4 MG SL tablet Place 1 tablet (0.4 mg total) under the tongue every 5 (five) minutes as needed for chest pain. 09/24/23   Cheryle Page, MD  omeprazole (PRILOSEC) 40 MG capsule Take 40 mg by mouth in the morning. Before breakfast 09/07/20   [provider]  pravastatin  (PRAVACHOL ) 40 MG tablet Take 1 tablet by mouth daily. 03/17/21   [provider]  umeclidinium-vilanterol (ANORO ELLIPTA ) 62.5-25 MCG/ACT AEPB Inhale 1 puff into the lungs daily. 09/24/23   Cheryle Page, MD    Scheduled Meds:  Continuous Infusions:  diltiazem  (CARDIZEM ) infusion 5 mg/hr (02/14/24 1906)   PRN Meds:   Allergies:    Allergies  Allergen Reactions   Lithium     Patient became suicidal   Carbamazepine Other (See Comments)    UNK reaction  Other Reaction(s): melted her skin    Social History:   Social History   Socioeconomic History   Marital status: Widowed    Spouse name: Not on file   Number of children: 1   Years of education: 37   Highest  education level: High school graduate  Occupational History   Occupation: retired  Tobacco Use   Smoking status: Every Day    Current packs/day: 1.00    Average packs/day: 1 pack/day for 60.0 years (60.0 ttl pk-yrs)    Types: Cigarettes    Passive exposure: Current   Smokeless tobacco: Never   Tobacco comments:    Doesn't want to quit. 01/24/2024  Vaping Use   Vaping status: Never Used  Substance and Sexual Activity   Alcohol use: No    Alcohol/week: 0.0 standard drinks of alcohol   Drug use: No   Sexual activity: Never  Other Topics Concern  Not on file  Social History Narrative   Lives with son and daughter in law in a 2 story home.  Has 1 son.  Retired from [employer] after 31 years.   Education: high school.  Widowed. Married 4 times. Last husband  married for 20 years. Had a dtr that lived 4 days, a son died at birth, cord around his neck.    [name] is the son lives with wife. No grandkids.    Likes to read and watch tv.       Caffeine- 1-2 coffee per day, 1 coke or tea a few times a month.   Legal-none   Religion-Christian, brought up [employer] Drivers of Health   Financial Resource Strain: Low Risk  (01/24/2024)   Overall Financial Resource Strain (CARDIA)    Difficulty of Paying Living Expenses: Not hard at all  Food Insecurity: No Food Insecurity (01/28/2024)   Hunger Vital Sign    Worried About Running Out of Food in the Last Year: Never true    Ran Out of Food in the Last Year: Never true  Transportation Needs: No Transportation Needs (01/28/2024)   PRAPARE - Administrator, Civil Service (Medical): No    Lack of Transportation (Non-Medical): No  Physical Activity: Inactive (01/24/2024)   Exercise Vital Sign    Days of Exercise per Week: 0 days    Minutes of Exercise per Session: 0 min  Stress: Not on file  Social Connections: Unknown (01/24/2024)   Social Connection and Isolation Panel    Frequency of Communication with Friends and Family: Not on file     Frequency of Social Gatherings with Friends and Family: Never    Attends Religious Services: Never    Database administrator or Organizations: No    Attends Banker Meetings: Never    Marital Status: Widowed  Intimate Partner Violence: Not At Risk (01/28/2024)   Humiliation, Afraid, Rape, and Kick questionnaire    Fear of Current or Ex-Partner: No    Emotionally Abused: No    Physically Abused: No    Sexually Abused: No    Family History:    Family History  Problem Relation Age of Onset   Alzheimer's disease Mother    Lung cancer Mother    Tuberculosis Father    Depression Father      ROS:  Please see the history of present illness.  All other ROS reviewed and negative.     Physical Exam/Data: Vitals:   02/14/24 1800 02/14/24 1840 02/14/24 1900 02/14/24 1915  BP: (!) 157/87 119/60 (!) 156/121 (!) 142/116  Pulse: (!) 147  (!) 132 (!) 115  Resp: (!) 24 (!) 27 (!) 26 (!) 24  Temp:      TempSrc:      SpO2: 100%  99% 100%  Weight:      Height:       No intake or output data in the 24 hours ending 02/14/24 2105    02/14/2024    4:42 PM 11/23/2023    1:00 PM 11/21/2023    8:39 AM  Last 3 Weights  Weight (lbs) 156 lb 153 lb 157 lb  Weight (kg) 70.761 kg 69.4 kg 71.215 kg     Body mass index is 25.96 kg/m.  General:  Well nourished, well developed, in no acute distress HEENT: normal Neck: no JVD Vascular: No carotid bruits; Distal pulses 2+ bilaterally Cardiac:  normal S1, S2; tachycardic and irregular; aortic systolic murmur  right upper sternal border 4-6 Lungs:  clear to auscultation bilaterally, she has prominent expiratory and inspiratory wheezing but no increased work of breathing Abd: soft, nontender, no hepatomegaly  Ext: no edema Musculoskeletal:  No deformities, BUE and BLE strength normal and equal Skin: warm and dry  Neuro:  CNs 2-12 intact, no focal abnormalities noted Psych:  Normal affect   EKG:  The EKG was personally reviewed and  demonstrates: A-fib with rapid ventricular rate, bifascicular block with RBBB, LAFB. Telemetry:  Telemetry was personally reviewed and demonstrates: Same as above  Relevant CV Studies:  Normal nuclear stress in 2017 Left heart cath 2016 normal  TTE 09/24/2023 IMPRESSIONS     1. Left ventricular ejection fraction, by estimation, is 60 to 65%. The  left ventricle has normal function. The left ventricle has no regional  wall motion abnormalities. Left ventricular diastolic parameters are  indeterminate.   2. Right ventricular systolic function is normal. The right ventricular  size is normal. There is moderately elevated pulmonary artery systolic  pressure. The estimated right ventricular systolic pressure is 45.9 mmHg.   3. Left atrial size was mildly dilated.   4. Right atrial size was mildly dilated.   5. The mitral valve is degenerative. No evidence of mitral valve  regurgitation. Mild mitral stenosis. The mean mitral valve gradient is 6.0  mmHg with MVA 2.1 cm^2 by VTI. Moderate mitral annular and valvular  calcification.   6. The aortic valve is tricuspid. There is severe calcifcation of the  aortic valve. Aortic valve regurgitation is mild. Moderate aortic valve  stenosis. Aortic valve area, by VTI measures 1.36 cm. Aortic valve mean  gradient measures 33.0 mmHg.   7. The inferior vena cava is dilated in size with <50% respiratory  variability, suggesting right atrial pressure of 15 mmHg.   8. The patient was in atrial fibrillation.    Laboratory Data: High Sensitivity Troponin:   Recent Labs  Lab 02/14/24 1746  TROPONINIHS 13     Chemistry Recent Labs  Lab 02/14/24 1746  NA 140  K 4.1  CL 110  CO2 21*  GLUCOSE 92  BUN 19  CREATININE 1.04*  CALCIUM 9.5  GFRNONAA 56*  ANIONGAP 9    No results for input(s): PROT, ALBUMIN, AST, ALT, ALKPHOS, BILITOT in the last 168 hours. Lipids No results for input(s): CHOL, TRIG, HDL, LABVLDL, LDLCALC,  CHOLHDL in the last 168 hours.  Hematology Recent Labs  Lab 02/14/24 1746  WBC 12.9*  RBC 5.68*  HGB 12.9  HCT 42.3  MCV 74.5*  MCH 22.7*  MCHC 30.5  RDW 19.1*  PLT 373   Thyroid  No results for input(s): TSH, FREET4 in the last 168 hours.  BNPNo results for input(s): BNP, PROBNP in the last 168 hours.  DDimer No results for input(s): DDIMER in the last 168 hours.  Radiology/Studies:  DG Chest Port 1 View Result Date: 02/14/2024 CLINICAL DATA:  Chest pain. EXAM: PORTABLE CHEST 1 VIEW COMPARISON:  September 22, 2023. FINDINGS: The heart size and mediastinal contours are within normal limits. Both lungs are clear. The visualized skeletal structures are unremarkable. IMPRESSION: No active disease. Electronically Signed   By: Lynwood Landy Raddle M.D.   On: 02/14/2024 17:15   Assessment and Plan: Permanent A-fib with rapid ventricular rate Suspect triggered by underlying lung pathology with either pneumonia (not clear on prior imaging that I reviewed) versus COPD exacerbation.  She does have prominent inspiratory and expiratory wheezing not on standard inhaler therapy for  which this needs to be optimized.  She needs admission for rate control, currently on IV diltiazem  5 mg.  Favor medicine admission for management of comorbidities and optimization of her COPD regimen and blood pressure control.  First troponin is 13, will follow-up repeat.  If stable very low suspicion for underlying ischemia given her RVR I would have expected to see a demand response.  Unfortunately given the permanent nature of A-fib I do not think cardioversion would result in sustained normal sinus rhythm. - Continue IV diltiazem  can titrate up to 15 mg for rate control less than 110 - Continue home metoprolol  100 mg daily - Continue home Eliquis  5 mg twice daily - Admission to medicine for optimization of COPD regimen and blood pressure control - Continue home Jardiance , Lasix  and losartan  - Please add proBNP to  her next lab collection, recent TSH normal - Could consider addition of digoxin  tomorrow again but this resulted in bradycardia previously - Continue telemetry, given age and bifascicular block on 2 nodal agents she is at risk for tachybradycardia syndrome - no evidence of volume overload from worsening heart failure or valvular disease.  Will see her heart rate response on diltiazem  and metoprolol  and further therapy for COPD over the next 24 hours.  Given recent echo I do not feel strongly she needs a repeat at this time unless her proBNP is markedly elevated (prior BNP in 2022 702)   Risk Assessment/Risk Scores:         CHA2DS2-VASc Score = 6   This indicates a 9.7% annual risk of stroke. The patient's score is based upon: CHF History: 1 HTN History: 1 Diabetes History: 0 Stroke History: 0 Vascular Disease History: 1 Age Score: 2 Gender Score: 1        For questions or updates, please contact [facility] Please consult [email or web address] for contact info under    Signed, Ozell Bushman, MD  02/14/2024 9:05 PM

## 2024-02-14 NOTE — ED Notes (Signed)
 PT requested to go home AMA. This RN stated that she didn't feel that would be a good idea due to her HR being in the 160's.This RN told the PT that she would get the physician to come in and speak with her.

## 2024-02-15 ENCOUNTER — Encounter (HOSPITAL_COMMUNITY): Payer: Self-pay | Admitting: Internal Medicine

## 2024-02-15 DIAGNOSIS — I2510 Atherosclerotic heart disease of native coronary artery without angina pectoris: Secondary | ICD-10-CM | POA: Diagnosis not present

## 2024-02-15 DIAGNOSIS — Z7984 Long term (current) use of oral hypoglycemic drugs: Secondary | ICD-10-CM | POA: Diagnosis not present

## 2024-02-15 DIAGNOSIS — I130 Hypertensive heart and chronic kidney disease with heart failure and stage 1 through stage 4 chronic kidney disease, or unspecified chronic kidney disease: Secondary | ICD-10-CM | POA: Diagnosis not present

## 2024-02-15 DIAGNOSIS — N1831 Chronic kidney disease, stage 3a: Secondary | ICD-10-CM | POA: Diagnosis not present

## 2024-02-15 DIAGNOSIS — I452 Bifascicular block: Secondary | ICD-10-CM | POA: Diagnosis not present

## 2024-02-15 DIAGNOSIS — M069 Rheumatoid arthritis, unspecified: Secondary | ICD-10-CM | POA: Diagnosis not present

## 2024-02-15 DIAGNOSIS — E1122 Type 2 diabetes mellitus with diabetic chronic kidney disease: Secondary | ICD-10-CM | POA: Diagnosis not present

## 2024-02-15 DIAGNOSIS — R072 Precordial pain: Secondary | ICD-10-CM | POA: Diagnosis not present

## 2024-02-15 DIAGNOSIS — R0789 Other chest pain: Secondary | ICD-10-CM | POA: Diagnosis not present

## 2024-02-15 DIAGNOSIS — H409 Unspecified glaucoma: Secondary | ICD-10-CM | POA: Diagnosis not present

## 2024-02-15 DIAGNOSIS — R079 Chest pain, unspecified: Secondary | ICD-10-CM | POA: Diagnosis not present

## 2024-02-15 DIAGNOSIS — I359 Nonrheumatic aortic valve disorder, unspecified: Secondary | ICD-10-CM | POA: Diagnosis not present

## 2024-02-15 DIAGNOSIS — I4821 Permanent atrial fibrillation: Secondary | ICD-10-CM | POA: Diagnosis not present

## 2024-02-15 DIAGNOSIS — E871 Hypo-osmolality and hyponatremia: Secondary | ICD-10-CM | POA: Diagnosis not present

## 2024-02-15 DIAGNOSIS — Z7901 Long term (current) use of anticoagulants: Secondary | ICD-10-CM | POA: Diagnosis not present

## 2024-02-15 DIAGNOSIS — E1169 Type 2 diabetes mellitus with other specified complication: Secondary | ICD-10-CM | POA: Diagnosis not present

## 2024-02-15 DIAGNOSIS — E7800 Pure hypercholesterolemia, unspecified: Secondary | ICD-10-CM | POA: Diagnosis not present

## 2024-02-15 DIAGNOSIS — I4891 Unspecified atrial fibrillation: Secondary | ICD-10-CM | POA: Diagnosis not present

## 2024-02-15 DIAGNOSIS — E785 Hyperlipidemia, unspecified: Secondary | ICD-10-CM | POA: Diagnosis not present

## 2024-02-15 DIAGNOSIS — J441 Chronic obstructive pulmonary disease with (acute) exacerbation: Secondary | ICD-10-CM | POA: Diagnosis not present

## 2024-02-15 DIAGNOSIS — F319 Bipolar disorder, unspecified: Secondary | ICD-10-CM | POA: Diagnosis not present

## 2024-02-15 DIAGNOSIS — I10 Essential (primary) hypertension: Secondary | ICD-10-CM | POA: Diagnosis not present

## 2024-02-15 DIAGNOSIS — I252 Old myocardial infarction: Secondary | ICD-10-CM | POA: Diagnosis not present

## 2024-02-15 DIAGNOSIS — Z79899 Other long term (current) drug therapy: Secondary | ICD-10-CM | POA: Diagnosis not present

## 2024-02-15 DIAGNOSIS — I5042 Chronic combined systolic (congestive) and diastolic (congestive) heart failure: Secondary | ICD-10-CM | POA: Diagnosis not present

## 2024-02-15 DIAGNOSIS — I080 Rheumatic disorders of both mitral and aortic valves: Secondary | ICD-10-CM | POA: Diagnosis not present

## 2024-02-15 DIAGNOSIS — I5032 Chronic diastolic (congestive) heart failure: Secondary | ICD-10-CM | POA: Diagnosis not present

## 2024-02-15 DIAGNOSIS — J449 Chronic obstructive pulmonary disease, unspecified: Secondary | ICD-10-CM | POA: Diagnosis not present

## 2024-02-15 DIAGNOSIS — E1165 Type 2 diabetes mellitus with hyperglycemia: Secondary | ICD-10-CM | POA: Diagnosis not present

## 2024-02-15 DIAGNOSIS — G8929 Other chronic pain: Secondary | ICD-10-CM | POA: Diagnosis not present

## 2024-02-15 DIAGNOSIS — I272 Pulmonary hypertension, unspecified: Secondary | ICD-10-CM | POA: Diagnosis not present

## 2024-02-15 DIAGNOSIS — F17210 Nicotine dependence, cigarettes, uncomplicated: Secondary | ICD-10-CM | POA: Diagnosis not present

## 2024-02-15 DIAGNOSIS — Z66 Do not resuscitate: Secondary | ICD-10-CM | POA: Diagnosis not present

## 2024-02-15 LAB — CBC
HCT: 43.1 % (ref 36.0–46.0)
Hemoglobin: 12.8 g/dL (ref 12.0–15.0)
MCH: 22.5 pg — ABNORMAL LOW (ref 26.0–34.0)
MCHC: 29.7 g/dL — ABNORMAL LOW (ref 30.0–36.0)
MCV: 75.9 fL — ABNORMAL LOW (ref 80.0–100.0)
Platelets: 369 K/uL (ref 150–400)
RBC: 5.68 MIL/uL — ABNORMAL HIGH (ref 3.87–5.11)
RDW: 19.5 % — ABNORMAL HIGH (ref 11.5–15.5)
WBC: 13.4 K/uL — ABNORMAL HIGH (ref 4.0–10.5)
nRBC: 0 % (ref 0.0–0.2)

## 2024-02-15 LAB — GLUCOSE, CAPILLARY
Glucose-Capillary: 115 mg/dL — ABNORMAL HIGH (ref 70–99)
Glucose-Capillary: 109 mg/dL — ABNORMAL HIGH (ref 70–99)

## 2024-02-15 LAB — COMPREHENSIVE METABOLIC PANEL WITH GFR
ALT: 14 U/L (ref 0–44)
AST: 16 U/L (ref 15–41)
Albumin: 3.4 g/dL — ABNORMAL LOW (ref 3.5–5.0)
Alkaline Phosphatase: 76 U/L (ref 38–126)
Anion gap: 12 (ref 5–15)
BUN: 17 mg/dL (ref 8–23)
CO2: 16 mmol/L — ABNORMAL LOW (ref 22–32)
Calcium: 9.1 mg/dL (ref 8.9–10.3)
Chloride: 112 mmol/L — ABNORMAL HIGH (ref 98–111)
Creatinine, Ser: 0.85 mg/dL (ref 0.44–1.00)
GFR, Estimated: >60 mL/min (ref >60)
Glucose, Bld: 127 mg/dL — ABNORMAL HIGH (ref 70–99)
Potassium: 3.8 mmol/L (ref 3.5–5.1)
Sodium: 140 mmol/L (ref 135–145)
Total Bilirubin: 0.7 mg/dL (ref 0.0–1.2)
Total Protein: 6.4 g/dL — ABNORMAL LOW (ref 6.5–8.1)

## 2024-02-15 LAB — TROPONIN I (HIGH SENSITIVITY): Troponin I (High Sensitivity): 12 ng/L (ref <18)

## 2024-02-15 LAB — CBG MONITORING, ED
Glucose-Capillary: 132 mg/dL — ABNORMAL HIGH (ref 70–99)
Glucose-Capillary: 111 mg/dL — ABNORMAL HIGH (ref 70–99)
Glucose-Capillary: 112 mg/dL — ABNORMAL HIGH (ref 70–99)

## 2024-02-15 LAB — BRAIN NATRIURETIC PEPTIDE: B Natriuretic Peptide: 376.4 pg/mL — ABNORMAL HIGH (ref 0.0–100.0)

## 2024-02-15 MED ORDER — AMOXICILLIN-POT CLAVULANATE 875-125 MG PO TABS
1.0000 | ORAL_TABLET | Freq: Two times a day (BID) | ORAL | Status: DC
  Administered 2024-02-15 – 2024-02-20 (×9): 1 via ORAL
  Filled 2024-02-15 (×10): qty 1

## 2024-02-15 MED ORDER — OXYCODONE HCL 5 MG PO TABS
5.0000 mg | ORAL_TABLET | Freq: Once | ORAL | Status: AC | PRN
  Administered 2024-02-15: 5 mg via ORAL
  Filled 2024-02-15: qty 1

## 2024-02-15 MED ORDER — PRAVASTATIN SODIUM 40 MG PO TABS
40.0000 mg | ORAL_TABLET | Freq: Every day | ORAL | Status: DC
  Administered 2024-02-15 – 2024-02-20 (×6): 40 mg via ORAL
  Filled 2024-02-15 (×6): qty 1

## 2024-02-15 MED ORDER — PANTOPRAZOLE SODIUM 40 MG PO TBEC
40.0000 mg | DELAYED_RELEASE_TABLET | Freq: Every day | ORAL | Status: DC
  Administered 2024-02-15 – 2024-02-20 (×6): 40 mg via ORAL
  Filled 2024-02-15 (×6): qty 1

## 2024-02-15 MED ORDER — HYDROCODONE-ACETAMINOPHEN 10-325 MG PO TABS
1.0000 | ORAL_TABLET | Freq: Three times a day (TID) | ORAL | Status: DC | PRN
  Administered 2024-02-15 – 2024-02-20 (×6): 1 via ORAL
  Filled 2024-02-15 (×6): qty 1

## 2024-02-15 MED ORDER — LOSARTAN POTASSIUM 25 MG PO TABS
25.0000 mg | ORAL_TABLET | Freq: Every day | ORAL | Status: DC
  Administered 2024-02-15 – 2024-02-20 (×6): 25 mg via ORAL
  Filled 2024-02-15 (×6): qty 1

## 2024-02-15 MED ORDER — DULOXETINE HCL 60 MG PO CPEP
120.0000 mg | ORAL_CAPSULE | Freq: Every day | ORAL | Status: DC
  Administered 2024-02-15 – 2024-02-20 (×6): 120 mg via ORAL
  Filled 2024-02-15 (×6): qty 2

## 2024-02-15 MED ORDER — FUROSEMIDE 20 MG PO TABS
20.0000 mg | ORAL_TABLET | ORAL | Status: DC
  Administered 2024-02-15 – 2024-02-19 (×3): 20 mg via ORAL
  Filled 2024-02-15 (×3): qty 1

## 2024-02-15 MED ORDER — EMPAGLIFLOZIN 25 MG PO TABS
25.0000 mg | ORAL_TABLET | Freq: Every day | ORAL | Status: DC
  Administered 2024-02-15 – 2024-02-20 (×6): 25 mg via ORAL
  Filled 2024-02-15 (×6): qty 1

## 2024-02-15 MED ORDER — NALOXONE HCL 0.4 MG/ML IJ SOLN: 0.4000 mg | INTRAMUSCULAR | Status: DC | PRN

## 2024-02-15 MED ORDER — LIDOCAINE 5 % EX PTCH
1.0000 | MEDICATED_PATCH | Freq: Every day | CUTANEOUS | Status: DC
  Administered 2024-02-15 – 2024-02-19 (×5): 1 via TRANSDERMAL
  Filled 2024-02-15 (×7): qty 1

## 2024-02-15 MED ORDER — DIGOXIN 0.25 MG/ML IJ SOLN
0.1250 mg | Freq: Once | INTRAMUSCULAR | Status: AC
  Administered 2024-02-15: 0.125 mg via INTRAVENOUS
  Filled 2024-02-15: qty 2

## 2024-02-15 NOTE — Progress Notes (Signed)
  Progress Note  Patient Name: Theresa Mendoza Date of Encounter: 02/15/2024 [facility] Cardiologist: Wilbert Bihari, MD   Interval Summary   No cardiac complaints still wheezy Discussed need for smoking cessation  Vital Signs Vitals:   02/15/24 0626 02/15/24 0735 02/15/24 0740 02/15/24 0750  BP:      Pulse:  (!) 131 (!) 172 (!) 106  Resp:  (!) 26 (!) 29 (!) 24  Temp: 98.8 F (37.1 C)     TempSrc:      SpO2:  100% 100% 95%  Weight:      Height:        Intake/Output Summary (Last 24 hours) at 02/15/2024 9078 Last data filed at 02/14/2024 2215 Gross per 24 hour  Intake 28.84 ml  Output --  Net 28.84 ml      02/14/2024    4:42 PM 11/23/2023    1:00 PM 11/21/2023    8:39 AM  Last 3 Weights  Weight (lbs) 156 lb 153 lb 157 lb  Weight (kg) 70.761 kg 69.4 kg 71.215 kg     Telemetry/ECG  AFib rates 100-120 - Personally Reviewed  Physical Exam   Chronically ill female AS murmur Diffuse insp/exp wheezing  Abdomen benign No significant edema  Assessment & Plan   Permanent atrial fibrillation, episodes of RVR Known history of permanent atrial fibrillation Home meds: Eliquis  5 mg BId, Toprol  100 mg daily, diltiazem  240 mg daily Previously on digoxin , stopped due to significant symptomatic bradycardia Suspect RVR episode is triggered by underlying lung pathology, possibly pneumonia vs COPD exacerbation Current heart rate 100-120 Continue home Toprol  100 mg daily Continue home Eliquis  5 mg BID Continue IV diltiazem  drip, currently on 15 mg/hr Would not likely benefit from cardioversion due to permanent atrial fibrillation One dose of Dig today. She has had bradycardia with it in past and has baseline bifasicular block  Chest pain Presented with chest pain Suspect secondary to lung pathology, A-fib with RVR Troponin negative x 2 CXR normal  Chronic HFimpEF March 2022 EF was 40 to 45%, improved to 60 to 65% March 2025 Home meds: PO Lasix  20 mg every other  day, Jardiance  25 mg daily, losartan  25 mg daily, Toprol  100 mg daily BNP 376 CXR normal   Moderate AS Mild AR, MS Echo March 2025 showed mild mitral stenosis, mild aortic regurgitation, moderate aortic stenosis Continue to follow outpatient with serial echocardiograms Not clear that she would be a TAVR candidate given extent of her lung dx and ongoing smoking    Per primary COPD Hypertension Diabetes CKD    For questions or updates, please contact [facility] Please consult [email or web address] for contact info under       Maude Emmer MD [facility]

## 2024-02-15 NOTE — Progress Notes (Signed)
 Patient arrived to unit from ED. Pt wiped with CHG wipes, drip checked against orders and IV site examined. Patient vital signs measured. Patient settled and resting in bed with call bell and personal belongings within reach; all needs met at this time.

## 2024-02-15 NOTE — ED Notes (Signed)
 Called 3e secretary to notify patient would be coming up.

## 2024-02-15 NOTE — Hospital Course (Addendum)
 Mr. Emley was admitted to the hospital with the working diagnosis of chest pain.   78 yo female with the past medical history of  atrial fibrillation, aortic stenosis, pulmonary hypertension,hypertension, hyperlipidemia, CKD3a, HFpEF, rheumatoid arthritis, anxiety/depression/BPD, COPD, glaucoma, migraine headaches, mild cognitive impairment, type 2 diabetes, and chronic back pain presenting with several days of substernal chest pain, palpitations, dyspnea, and nausea. He was evaluated by his primary care and he was found in atrial fibrillation with RVR, he received diltiazem  25 mg and EMS was called. He received 500 cc NS on route to the ED.  On his initial physical examination in the ED his heart rate 160s, blood pressure 156/121, RR 26 and 02 saturation 99% Lungs with bilateral wheezing, heart with S1 and S2 present, tachycardic, irregularly irregular, systolic murmur at the base, abdomen with no distention and no lower extremity edema.   Na 140, K 4.1 CL 110 bicarbonate 21 glucose 92 bun 19 cr 1,0 BNP 376.4  High sensitive troponin 13 and 12  Wbc 12.9 hgb 12.9 plt 373   Chest radiograph with no cardiomegaly, bilateral hilar vascula congestion, with no infiltrates and no effusions.   EKG 102 bpm, left axis deviation, left anterior fascicular block, right bundle branch block, qtc 489, atrial fibrillation rhythm, no significant ST segment or  T wave changes.   Patient was placed on diltiazem  infusion for rate control.   Patient responded well to IV AV blockade and was transitioned to po diltiazem  with good toleration.  Further work up with stress test, which was low risk.   Patient will follow up as outpatient.

## 2024-02-15 NOTE — Progress Notes (Signed)
 PROGRESS NOTE Theresa Mendoza  FMW:[id number] DOB: 1946/03/05 DOA: 02/14/2024 PCP: Regino Slater, MD  Brief Narrative/Hospital Course: 47 yof w/ hx significant for permanent A-fib on Eliquis , moderate AS/mild MS, pulmonary hypertension, RBBB,hypertension, hyperlipidemia, CKD3a, HFpEF, rheumatoid arthritis, anxiety/depression/BPD, COPD, glaucoma, migraine headaches, mild cognitive impairment, type 2 diabetes, chronic back pain presenting with a chief complaint of chest pain and sent to the ED by PCP. In the ED noted to have heart rate 160s A-fib with RVR otherwise vitals stable- EKG did show some ST elevations in inferior leads and was reviewed with cardiologist who did not feel that it met STEMI criteria. Labs notable for WBC count 12.9 (mildly elevated on previous labs as well), bicarb 21, creatinine 1.0 (stable), initial troponin negative .Chest x-ray NAD, cardiology consulted placed on Cardizem  and admitted  Subjective: Seen and examined today in the ED Resting comfortably on the bedside chair complains of palpitation Overnight heart rate in 120, BP stable labs with BNP 376 troponin 12 mild leukocytosis otherwise stable  Assessment and plan:  Permanent A-fib with RVR: Likely triggered due to underlying lung pathology, cardiology has been consulted continue Cardizem  drip, metoprolol  milligram daily, home Eliquis  5 twice daily, monitor on telemetry, follow-up cardiology recommendation> would likely not benefit with cardioversion given permanent A-fib, giving digoxin  x 1 as heart rate is poorly controlled. Cont per cardio.  Chest pain: Likely from A-fib with RVR troponin negative x 2.     Chronic CHF and valvular disease-moderate AAS/mild MS BNP slightly up previous EF 60-65%.  Euvolemic. Continue home Jardiance , Lasix , and losartan   Net IO Since Admission: 28.84 mL [02/15/24 0951]   COPD Not in exacerbation, continue triple inhaler therapy and prn nebs  Hypertension Stable BP,  continue Cardizem  drip and metoprolol .   Type 2 diabetes Last A1c 7.6 in March 2025.cont ssi   Hyperlipidemia: Cont statins  Rheumatoid arthritis Anxiety/depression/bipolar disorder Glaucoma: Resume home meds-including Cymbalta  Patient received antibiotics ?- continue and complete the course  DVT prophylaxis: Eliquis  Code Status:   Code Status: Limited: Do not attempt resuscitation (DNR) -DNR-LIMITED -Do Not Intubate/DNI  Family Communication: plan of care discussed with patient/at bedside. Patient status is: Remains hospitalized because of severity of illness Level of care: Progressive   Dispo: The patient is from: home w/ son and family            Anticipated disposition: TBD Objective: Vitals last 24 hrs: Vitals:   02/15/24 0215 02/15/24 0220 02/15/24 0615 02/15/24 0626  BP: (!) 150/133  (!) 150/56   Pulse: (!) 38  (!) 119   Resp: (!) 33  (!) 28   Temp:  98.6 F (37 C)  98.8 F (37.1 C)  TempSrc:  Oral    SpO2: 97%  96%   Weight:      Height:        Physical Examination: General exam: alert awake, oriented x 3 HEENT:Oral mucosa moist, Ear/Nose WNL grossly Respiratory system: Bilaterally clear BS,no use of accessory muscle Cardiovascular system: S1 & S2 + irregular irregular heart rate, No JVD. Gastrointestinal system: Abdomen soft,NT,ND, BS+ Nervous System: Alert, awake, moving all extremities,and following commands. Extremities: LE edema neg, distal extremities warm.  Skin: No rashes,no icterus. MSK: Normal muscle bulk,tone, power     Data Reviewed: I have personally reviewed following labs and imaging studies ( see epic result tab) CBC: Recent Labs  Lab 02/14/24 1746 02/15/24 0033  WBC 12.9* 13.4*  HGB 12.9 12.8  HCT 42.3 43.1  MCV 74.5* 75.9*  PLT  373 369   CMP: Recent Labs  Lab 02/14/24 1746 02/15/24 0033  NA 140 140  K 4.1 3.8  CL 110 112*  CO2 21* 16*  GLUCOSE 92 127*  BUN 19 17  CREATININE 1.04* 0.85  CALCIUM 9.5 9.1   GFR: Estimated  Creatinine Clearance: 55.6 mL/min (by C-G formula based on SCr of 0.85 mg/dL). Recent Labs  Lab 02/15/24 0033  AST 16  ALT 14  ALKPHOS 76  BILITOT 0.7  PROT 6.4*  ALBUMIN 3.4*   No results for input(s): LIPASE, AMYLASE in the last 168 hours. No results for input(s): AMMONIA in the last 168 hours. Coagulation Profile: No results for input(s): INR, PROTIME in the last 168 hours. Unresulted Labs (From admission, onward)    None      Antimicrobials/Microbiology: Anti-infectives (From admission, onward)    Start     Dose/Rate Route Frequency Ordered Stop   02/15/24 1000  amoxicillin-clavulanate (AUGMENTIN) 875-125 MG per tablet 1 tablet       Note to Pharmacy: For 10 days     1 tablet Oral 2 times daily 02/15/24 0950           Component Value Date/Time   SDES  10/13/2021 1342    BLOOD BLOOD LEFT HAND Performed at [facility], [address], [place], [place] [address]    [employer]  10/13/2021 1342    Blood Culture adequate volume BOTTLES DRAWN AEROBIC AND ANAEROBIC Performed at [facility], [address] [place], [place] [address]    CULT (A) 10/13/2021 1342    PROPIONIBACTERIUM ACNES Standardized susceptibility testing for this organism is not available. Performed at [facility], [address], [place], [place] [address]    REPTSTATUS 10/19/2021 FINAL 10/13/2021 1342   Medications reviewed:  Scheduled Meds:  amoxicillin-clavulanate  1 tablet Oral BID   apixaban   5 mg Oral BID   digoxin   0.125 mg Intravenous Once   DULoxetine   120 mg Oral Daily   empagliflozin   25 mg Oral Daily   furosemide   20 mg Oral QODAY   insulin  aspart  0-5 Units Subcutaneous QHS   insulin  aspart  0-9 Units Subcutaneous TID WC   lidocaine   1 patch Transdermal Daily   losartan   25 mg Oral Daily   metoprolol  succinate  100 mg Oral Daily   pantoprazole   40 mg Oral Daily   pravastatin   40 mg Oral Daily   umeclidinium-vilanterol  1  puff Inhalation Daily   Continuous Infusions:  diltiazem  (CARDIZEM ) infusion 15 mg/hr (02/15/24 0536)    Mennie LAMY, MD [facility] 02/15/2024, 10:00 AM

## 2024-02-16 DIAGNOSIS — I4891 Unspecified atrial fibrillation: Secondary | ICD-10-CM | POA: Diagnosis not present

## 2024-02-16 LAB — CBC
HCT: 38.5 % (ref 36.0–46.0)
Hemoglobin: 11.6 g/dL — ABNORMAL LOW (ref 12.0–15.0)
MCH: 22.7 pg — ABNORMAL LOW (ref 26.0–34.0)
MCHC: 30.1 g/dL (ref 30.0–36.0)
MCV: 75.3 fL — ABNORMAL LOW (ref 80.0–100.0)
Platelets: 326 K/uL (ref 150–400)
RBC: 5.11 MIL/uL (ref 3.87–5.11)
RDW: 19.5 % — ABNORMAL HIGH (ref 11.5–15.5)
WBC: 12.3 K/uL — ABNORMAL HIGH (ref 4.0–10.5)
nRBC: 0 % (ref 0.0–0.2)

## 2024-02-16 LAB — GLUCOSE, CAPILLARY
Glucose-Capillary: 106 mg/dL — ABNORMAL HIGH (ref 70–99)
Glucose-Capillary: 114 mg/dL — ABNORMAL HIGH (ref 70–99)
Glucose-Capillary: 171 mg/dL — ABNORMAL HIGH (ref 70–99)
Glucose-Capillary: 165 mg/dL — ABNORMAL HIGH (ref 70–99)

## 2024-02-16 LAB — BASIC METABOLIC PANEL WITH GFR
Anion gap: 7 (ref 5–15)
BUN: 23 mg/dL (ref 8–23)
CO2: 26 mmol/L (ref 22–32)
Calcium: 8.9 mg/dL (ref 8.9–10.3)
Chloride: 104 mmol/L (ref 98–111)
Creatinine, Ser: 1.03 mg/dL — ABNORMAL HIGH (ref 0.44–1.00)
GFR, Estimated: 56 mL/min — ABNORMAL LOW (ref >60)
Glucose, Bld: 100 mg/dL — ABNORMAL HIGH (ref 70–99)
Potassium: 5 mmol/L (ref 3.5–5.1)
Sodium: 137 mmol/L (ref 135–145)

## 2024-02-16 MED ORDER — LORAZEPAM 1 MG PO TABS: 2.0000 mg | ORAL_TABLET | Freq: Once | ORAL | Status: DC

## 2024-02-16 MED ORDER — HYDROXYZINE HCL 25 MG PO TABS
25.0000 mg | ORAL_TABLET | Freq: Three times a day (TID) | ORAL | Status: DC | PRN
  Administered 2024-02-18 – 2024-02-20 (×3): 25 mg via ORAL
  Filled 2024-02-16 (×3): qty 1

## 2024-02-16 MED ORDER — DILTIAZEM HCL 30 MG PO TABS: 45.0000 mg | ORAL_TABLET | Freq: Four times a day (QID) | ORAL | Status: DC

## 2024-02-16 MED ORDER — ONDANSETRON HCL 4 MG/2ML IJ SOLN
4.0000 mg | Freq: Four times a day (QID) | INTRAMUSCULAR | Status: DC | PRN
  Administered 2024-02-16 (×2): 4 mg via INTRAVENOUS
  Filled 2024-02-16 (×2): qty 2

## 2024-02-16 MED ORDER — DILTIAZEM HCL 30 MG PO TABS
45.0000 mg | ORAL_TABLET | Freq: Four times a day (QID) | ORAL | Status: DC
  Administered 2024-02-16 – 2024-02-17 (×3): 45 mg via ORAL
  Filled 2024-02-16 (×3): qty 2

## 2024-02-16 MED ORDER — PREDNISONE 20 MG PO TABS
40.0000 mg | ORAL_TABLET | Freq: Every day | ORAL | Status: AC
  Administered 2024-02-16 – 2024-02-20 (×5): 40 mg via ORAL
  Filled 2024-02-16 (×5): qty 2

## 2024-02-16 MED ORDER — LEVALBUTEROL HCL 0.63 MG/3ML IN NEBU
0.6300 mg | INHALATION_SOLUTION | Freq: Four times a day (QID) | RESPIRATORY_TRACT | Status: DC
  Administered 2024-02-16 – 2024-02-17 (×3): 0.63 mg via RESPIRATORY_TRACT
  Filled 2024-02-16 (×3): qty 3

## 2024-02-16 MED ORDER — DILTIAZEM HCL 60 MG PO TABS
90.0000 mg | ORAL_TABLET | Freq: Four times a day (QID) | ORAL | Status: DC
  Administered 2024-02-16: 90 mg via ORAL
  Filled 2024-02-16 (×2): qty 1

## 2024-02-16 NOTE — Progress Notes (Signed)
 Mobility Specialist Progress Note:   02/16/24 1200  Mobility  Activity Ambulated with assistance  Level of Assistance Standby assist, set-up cues, supervision of patient - no hands on  Assistive Device Other (Comment) (IV Pole)  Distance Ambulated (ft) 400 ft  Activity Response Tolerated well  Mobility Referral Yes  Mobility visit 1 Mobility  Mobility Specialist Start Time (ACUTE ONLY) 1200  Mobility Specialist Stop Time (ACUTE ONLY) 1210  Mobility Specialist Time Calculation (min) (ACUTE ONLY) 10 min   Pt agreeable to mobility session. Required only supervision for ambulation with IV pole. C/o only chronic back pain. SpO2 WFL on RA throughout, HR up to 130s. Pt left sitting EOB with all needs met, eating lunch.   Therisa Rana Mobility Specialist Please contact via SecureChat or  Rehab office at [phone]

## 2024-02-16 NOTE — Plan of Care (Signed)
  Problem: Education: Goal: Ability to describe self-care measures that may prevent or decrease complications (Diabetes Survival Skills Education) will improve Outcome: Progressing   Problem: Coping: Goal: Ability to adjust to condition or change in health will improve Outcome: Progressing   Problem: Fluid Volume: Goal: Ability to maintain a balanced intake and output will improve Outcome: Progressing   Problem: Health Behavior/Discharge Planning: Goal: Ability to manage health-related needs will improve Outcome: Progressing   Problem: Metabolic: Goal: Ability to maintain appropriate glucose levels will improve Outcome: Progressing   

## 2024-02-16 NOTE — Progress Notes (Signed)
 Progress Note   Patient: Theresa Mendoza FMW:[id number] DOB: 1945/07/23 DOA: 02/14/2024     1 DOS: the patient was seen and examined on 02/16/2024   Brief hospital course: 78 year old woman with PMH of permanent A-fib on Eliquis , moderate AS/mild MS, pulmonary hypertension, RBBB,hypertension, hyperlipidemia, CKD3a, HFpEF, rheumatoid arthritis, anxiety/depression/BPD, COPD, glaucoma, migraine headaches, mild cognitive impairment, type 2 diabetes, chronic back pain presenting with a chief complaint of chest pain and sent to the ED by PCP. In the ED noted to have heart rate 160s A-fib with RVR otherwise vitals stable- EKG did show some ST elevations in inferior leads and was reviewed with cardiologist who did not feel that it met STEMI criteria. Labs notable for WBC count 12.9 (mildly elevated on previous labs as well), bicarb 21, creatinine 1.0 (stable), initial troponin negative .Chest x-ray NAD, cardiology consulted placed on Cardizem  and admitted.   Assessment and Plan:  Permanent A-fib with RVR: Likely triggered by COPD exacerbation. Cardiology consulted, input appreciated. Patient was started on Cardizem  drip. Per cardiology, patient would likely not benefit from cardioversion given permanent atrial fibrillation. Patient received a dose of digoxin . - Continue home Toprol . - Transitioning to oral Cardizem  today, 8/2. - Continue home Eliquis .  Chest pain: Likely from A-fib with RVR troponin negative x 2. - Continue management as indicated above.  COPD with exacerbation Patient complains of shortness of breath. - Continue home inhalers. - Scheduled levalbuterol . - Prednisone  burst x 5 days. - Continue Augmentin .   Chronic CHF and valvular disease-moderate AAS/mild MS Does not appear to be in exacerbation. - Continue home Lasix , Jardiance , losartan , metoprolol .  Hypertension Stable BP, continue Cardizem  drip and metoprolol .   Type 2 diabetes Last A1c 7.6 in March  2025. -Continue SSI.   Hyperlipidemia: Continue statins   Rheumatoid arthritis Anxiety/depression/bipolar disorder Glaucoma: -Continue home medications. - Ativan  x 1. - As needed hydroxyzine  for anxiety.        Subjective: Patient complains of shortness of breath, anxiety.  She says she did not sleep well due to shortness of breath.  Physical Exam: Vitals:   02/16/24 0810 02/16/24 1025 02/16/24 1142 02/16/24 1227  BP: 121/60 121/60 (!) 142/90 (!) 142/90  Pulse:  76    Resp:      Temp: 98.3 F (36.8 C)  98.4 F (36.9 C)   TempSrc: Oral  Oral   SpO2:      Weight:      Height:       Physical Exam   General: Alert, oriented X3  Eyes: Pupils equal, reactive  Oral cavity: moist mucous membranes  Head: Atraumatic, normocephalic  Neck: supple  Chest: clear to auscultation.  Diminished breath sounds CVS: S1,S2 RRR. No murmurs  Abd: No distention, soft, non-tender. No masses palpable  Extr: No edema   MSK: No joint deformities or swelling  Neurological: Grossly intact.    Data Reviewed:     Latest Ref Rng & Units 02/16/2024    3:20 AM 02/15/2024   12:33 AM 02/14/2024    5:46 PM  CBC  WBC 4.0 - 10.5 K/uL 12.3  13.4  12.9   Hemoglobin 12.0 - 15.0 g/dL 88.3  87.1  87.0   Hematocrit 36.0 - 46.0 % 38.5  43.1  42.3   Platelets 150 - 400 K/uL 326  369  373       Latest Ref Rng & Units 02/16/2024    3:20 AM 02/15/2024   12:33 AM 02/14/2024    5:46 PM  BMP  Glucose 70 - 99 mg/dL 899  872  92   BUN 8 - 23 mg/dL 23  17  19    Creatinine 0.44 - 1.00 mg/dL 8.96  9.14  8.95   Sodium 135 - 145 mmol/L 137  140  140   Potassium 3.5 - 5.1 mmol/L 5.0  3.8  4.1   Chloride 98 - 111 mmol/L 104  112  110   CO2 22 - 32 mmol/L 26  16  21    Calcium  8.9 - 10.3 mg/dL 8.9  9.1  9.5      Family Communication: n/a  Disposition: Status is: Inpatient Remains inpatient appropriate because: Ongoing management of A-fib with RVR, COPD exacerbation.  Planned Discharge Destination:  Home    Time spent: 40 minutes  Author: MDALA-GAUSI, Vashon Arch [name], MD 02/16/2024 1:31 PM  For on call review [email or web address].

## 2024-02-16 NOTE — Progress Notes (Signed)
  Progress Note  Patient Name: Theresa Mendoza Date of Encounter: 02/16/2024 [facility] Cardiologist: Wilbert Bihari, MD   Interval Summary   No cardiac complaints still wheezy Discussed need for smoking cessation HR improved this am 70-80's   Vital Signs Vitals:   02/16/24 0436 02/16/24 0800 02/16/24 0810 02/16/24 1025  BP: (!) 146/67  121/60 121/60  Pulse: 78 72  76  Resp: 18 20    Temp: 98.2 F (36.8 C)  98.3 F (36.8 C)   TempSrc: Oral  Oral   SpO2: 95% 96%    Weight: 69.4 kg     Height:        Intake/Output Summary (Last 24 hours) at 02/16/2024 1054 Last data filed at 02/16/2024 9189 Gross per 24 hour  Intake 600 ml  Output 1800 ml  Net -1200 ml      02/16/2024    4:36 AM 02/15/2024    3:58 PM 02/14/2024    4:42 PM  Last 3 Weights  Weight (lbs) 153 lb 151 lb 3.8 oz 156 lb  Weight (kg) 69.4 kg 68.6 kg 70.761 kg     Telemetry/ECG  AFib rates 100-120 - Personally Reviewed  Physical Exam   Chronically ill female AS murmur Diffuse insp/exp wheezing  Abdomen benign No significant edema  Assessment & Plan   Permanent atrial fibrillation, episodes of RVR Known history of permanent atrial fibrillation Home meds: Eliquis  5 mg BId, Toprol  100 mg daily, diltiazem  240 mg daily Previously on digoxin , stopped due to significant symptomatic bradycardia Suspect RVR episode is triggered by underlying lung pathology, possibly pneumonia vs COPD exacerbation Current heart rate 100-120 Continue home Toprol  100 mg daily Continue home Eliquis  5 mg BID Transition to oral cardizem   Would not likely benefit from cardioversion due to permanent atrial fibrillation Hold IV digoxin  for now   Chest pain Presented with chest pain Suspect secondary to lung pathology, A-fib with RVR Troponin negative x 2 CXR normal  Chronic HFimpEF March 2022 EF was 40 to 45%, improved to 60 to 65% March 2025 Home meds: PO Lasix  20 mg every other day, Jardiance  25 mg daily, losartan  25  mg daily, Toprol  100 mg daily BNP 376 CXR normal   Moderate AS Mild AR, MS Echo March 2025 showed mild mitral stenosis, mild aortic regurgitation, moderate aortic stenosis Continue to follow outpatient with serial echocardiograms Not clear that she would be a TAVR candidate given extent of her lung dx and ongoing smoking    Per primary COPD Hypertension Diabetes CKD    For questions or updates, please contact [facility] Please consult [email or web address] for contact info under       Maude Emmer MD [facility]

## 2024-02-16 NOTE — Progress Notes (Signed)
 1752: Spoke with pt's son/POA [name] and provided update as to patient's current status. All questions/concerns answered.

## 2024-02-17 DIAGNOSIS — I4891 Unspecified atrial fibrillation: Secondary | ICD-10-CM | POA: Diagnosis not present

## 2024-02-17 LAB — GLUCOSE, CAPILLARY
Glucose-Capillary: 142 mg/dL — ABNORMAL HIGH (ref 70–99)
Glucose-Capillary: 134 mg/dL — ABNORMAL HIGH (ref 70–99)
Glucose-Capillary: 169 mg/dL — ABNORMAL HIGH (ref 70–99)
Glucose-Capillary: 121 mg/dL — ABNORMAL HIGH (ref 70–99)

## 2024-02-17 LAB — BASIC METABOLIC PANEL WITH GFR
Anion gap: 12 (ref 5–15)
BUN: 24 mg/dL — ABNORMAL HIGH (ref 8–23)
CO2: 18 mmol/L — ABNORMAL LOW (ref 22–32)
Calcium: 9.3 mg/dL (ref 8.9–10.3)
Chloride: 105 mmol/L (ref 98–111)
Creatinine, Ser: 1.13 mg/dL — ABNORMAL HIGH (ref 0.44–1.00)
GFR, Estimated: 50 mL/min — ABNORMAL LOW (ref >60)
Glucose, Bld: 161 mg/dL — ABNORMAL HIGH (ref 70–99)
Potassium: 4.6 mmol/L (ref 3.5–5.1)
Sodium: 135 mmol/L (ref 135–145)

## 2024-02-17 LAB — CBC
HCT: 42 % (ref 36.0–46.0)
Hemoglobin: 12.6 g/dL (ref 12.0–15.0)
MCH: 22.5 pg — ABNORMAL LOW (ref 26.0–34.0)
MCHC: 30 g/dL (ref 30.0–36.0)
MCV: 75.1 fL — ABNORMAL LOW (ref 80.0–100.0)
Platelets: 342 K/uL (ref 150–400)
RBC: 5.59 MIL/uL — ABNORMAL HIGH (ref 3.87–5.11)
RDW: 19.3 % — ABNORMAL HIGH (ref 11.5–15.5)
WBC: 9 K/uL (ref 4.0–10.5)
nRBC: 0 % (ref 0.0–0.2)

## 2024-02-17 MED ORDER — GUAIFENESIN-DM 100-10 MG/5ML PO SYRP
5.0000 mL | ORAL_SOLUTION | ORAL | Status: DC | PRN
  Administered 2024-02-17: 5 mL via ORAL
  Filled 2024-02-17: qty 5

## 2024-02-17 MED ORDER — LEVALBUTEROL HCL 0.63 MG/3ML IN NEBU: 0.6300 mg | INHALATION_SOLUTION | Freq: Four times a day (QID) | RESPIRATORY_TRACT | Status: DC | PRN

## 2024-02-17 MED ORDER — DILTIAZEM HCL 30 MG PO TABS
30.0000 mg | ORAL_TABLET | Freq: Four times a day (QID) | ORAL | Status: DC
  Administered 2024-02-17 – 2024-02-18 (×4): 30 mg via ORAL
  Filled 2024-02-17 (×4): qty 1

## 2024-02-17 NOTE — Plan of Care (Signed)
  Problem: Education: Goal: Ability to describe self-care measures that may prevent or decrease complications (Diabetes Survival Skills Education) will improve Outcome: Progressing   Problem: Coping: Goal: Ability to adjust to condition or change in health will improve Outcome: Progressing   Problem: Fluid Volume: Goal: Ability to maintain a balanced intake and output will improve Outcome: Progressing   Problem: Metabolic: Goal: Ability to maintain appropriate glucose levels will improve Outcome: Progressing   Problem: Nutritional: Goal: Maintenance of adequate nutrition will improve Outcome: Progressing   Problem: Skin Integrity: Goal: Risk for impaired skin integrity will decrease Outcome: Progressing   Problem: Education: Goal: Knowledge of General Education information will improve Description: Including pain rating scale, medication(s)/side effects and non-pharmacologic comfort measures Outcome: Progressing   Problem: Clinical Measurements: Goal: Will remain free from infection Outcome: Progressing   Problem: Activity: Goal: Risk for activity intolerance will decrease Outcome: Progressing

## 2024-02-17 NOTE — Plan of Care (Signed)
  Problem: Education: Goal: Ability to describe self-care measures that may prevent or decrease complications (Diabetes Survival Skills Education) will improve Outcome: Progressing Goal: Individualized Educational Video(s) Outcome: Progressing   Problem: Coping: Goal: Ability to adjust to condition or change in health will improve Outcome: Progressing   Problem: Fluid Volume: Goal: Ability to maintain a balanced intake and output will improve Outcome: Progressing   Problem: Health Behavior/Discharge Planning: Goal: Ability to identify and utilize available resources and services will improve Outcome: Progressing Goal: Ability to manage health-related needs will improve Outcome: Progressing   Problem: Metabolic: Goal: Ability to maintain appropriate glucose levels will improve Outcome: Progressing   Problem: Tissue Perfusion: Goal: Adequacy of tissue perfusion will improve Outcome: Progressing   Problem: Health Behavior/Discharge Planning: Goal: Ability to manage health-related needs will improve Outcome: Progressing

## 2024-02-17 NOTE — Progress Notes (Signed)
  Progress Note  Patient Name: Theresa Mendoza Date of Encounter: 02/17/2024  [facility] Cardiologist: Wilbert Bihari, MD   Interval Summary   No cardiac complaints still wheezy Discussed need for smoking cessation HR dipped low overnight 40's in afib   Vital Signs Vitals:   02/16/24 2007 02/16/24 2308 02/17/24 0310 02/17/24 0635  BP: (!) 142/74 134/71 (!) 145/58 124/67  Pulse: 75 87 83   Resp: (!) 21 16 18    Temp: 98.1 F (36.7 C) 98.3 F (36.8 C) 98.1 F (36.7 C)   TempSrc: Oral Oral Oral   SpO2: 94% 96% 100%   Weight:   69.7 kg   Height:        Intake/Output Summary (Last 24 hours) at 02/17/2024 9188 Last data filed at 02/17/2024 0100 Gross per 24 hour  Intake 480 ml  Output 2200 ml  Net -1720 ml      02/17/2024    3:10 AM 02/16/2024    4:36 AM 02/15/2024    3:58 PM  Last 3 Weights  Weight (lbs) 153 lb 11.2 oz 153 lb 151 lb 3.8 oz  Weight (kg) 69.718 kg 69.4 kg 68.6 kg     Telemetry/ECG  AFib rates 100-120 - Personally Reviewed  Physical Exam   Chronically ill female AS murmur Diffuse insp/exp wheezing  Abdomen benign No significant edema  Assessment & Plan   Permanent atrial fibrillation, episodes of RVR Known history of permanent atrial fibrillation Home meds: Eliquis  5 mg BId, Toprol  100 mg daily, diltiazem  240 mg daily Previously on digoxin , stopped due to significant symptomatic bradycardia Suspect RVR episode is triggered by underlying lung pathology, possibly pneumonia vs COPD exacerbation Current heart rate 100-120 Continue home Toprol  100 mg daily Continue home Eliquis  5 mg BID Decrease oral cardizem  30 mg q6 Would not likely benefit from cardioversion due to permanent atrial fibrillation No iv digoxin   Chest pain Presented with chest pain Suspect secondary to lung pathology, A-fib with RVR Troponin negative x 2 CXR normal  Chronic HFimpEF March 2022 EF was 40 to 45%, improved to 60 to 65% March 2025 Home meds: PO Lasix  20 mg  every other day, Jardiance  25 mg daily, losartan  25 mg daily, Toprol  100 mg daily BNP 376 CXR normal   Moderate AS Mild AR, MS Echo March 2025 showed mild mitral stenosis, mild aortic regurgitation, moderate aortic stenosis Continue to follow outpatient with serial echocardiograms Not clear that she would be a TAVR candidate given extent of her lung dx and ongoing smoking    Per primary COPD Hypertension Diabetes CKD    For questions or updates, please contact  [facility] Please consult [email or web address] for contact info under       Maude Emmer MD [facility]

## 2024-02-17 NOTE — Progress Notes (Signed)
 Mobility Specialist Progress Note:   02/17/24 1115  Mobility  Activity Ambulated independently  Level of Assistance Modified independent, requires aide device or extra time  Assistive Device None  Distance Ambulated (ft) 30 ft  Activity Response Tolerated fair  Mobility Referral Yes  Mobility visit 1 Mobility  Mobility Specialist Start Time (ACUTE ONLY) 1115  Mobility Specialist Stop Time (ACUTE ONLY) 1125  Mobility Specialist Time Calculation (min) (ACUTE ONLY) 10 min   Pt agreeable to minimal mobility at this time d/t fatigue. No physical assistance required during room ambulation. HR elevated to 140 unsustained, majority of session in low 100s. Pt asx throughout. Back in bed with all needs met. HR 90s.   Therisa Rana Mobility Specialist Please contact via SecureChat or  Rehab office at [phone]

## 2024-02-17 NOTE — Progress Notes (Signed)
 Progress Note   Patient: Theresa Mendoza DOB: 10-19-1945 DOA: 02/14/2024     2 DOS: the patient was seen and examined on 02/17/2024   Brief hospital course: 78 year old woman with PMH of permanent A-fib on Eliquis , moderate AS/mild MS, pulmonary hypertension, RBBB,hypertension, hyperlipidemia, CKD3a, HFpEF, rheumatoid arthritis, anxiety/depression/BPD, COPD, glaucoma, migraine headaches, mild cognitive impairment, type 2 diabetes, chronic back pain presenting with a chief complaint of chest pain and sent to the ED by PCP. In the ED noted to have heart rate 160s A-fib with RVR otherwise vitals stable- EKG did show some ST elevations in inferior leads and was reviewed with cardiologist who did not feel that it met STEMI criteria. Labs notable for WBC count 12.9 (mildly elevated on previous labs as well), bicarb 21, creatinine 1.0 (stable), initial troponin negative .Chest x-ray NAD, cardiology consulted placed on Cardizem  and admitted.   Assessment and Plan:  Permanent A-fib with RVR: Likely triggered by COPD exacerbation. Cardiology consulted, input appreciated. Patient was started on Cardizem  drip, subsequently transitioned to p.o. Cardizem . Per cardiology, patient would likely not benefit from cardioversion given permanent atrial fibrillation. Patient received a dose of digoxin . - Continue home Toprol . - Continue oral Cardizem  30 mg every 6 hours. - Continue home Eliquis . - Possible discharge home in a.m. if heart rate remains stable.  Chest pain: Likely from A-fib with RVR troponin negative x 2. Chest pain has resolved. - Continue management as indicated above.  COPD with exacerbation Patient complains of shortness of breath. - Continue home inhalers. - Scheduled levalbuterol . - Prednisone  burst x 5 days. - Continue Augmentin .   Chronic CHF and valvular disease-moderate AAS/mild MS Does not appear to be in exacerbation. - Continue home Lasix , Jardiance ,  losartan , metoprolol .  Hypertension Stable BP, continue Cardizem  drip and metoprolol .   Type 2 diabetes Last A1c 7.6 in March 2025. -Continue SSI.   Hyperlipidemia: Continue statins   Rheumatoid arthritis Anxiety/depression/bipolar disorder Glaucoma: -Continue home medications. - As needed hydroxyzine  for anxiety.        Subjective: Patient says she still did not sleep well last night.  She was just unable to sleep.  She is less short of breath.  Physical Exam: Vitals:   02/17/24 0821 02/17/24 0941 02/17/24 1158 02/17/24 1217  BP: (!) 136/100 (!) 136/100 136/82 136/82  Pulse: 94 88 83   Resp:   17   Temp: (!) 97.5 F (36.4 C)  97.6 F (36.4 C)   TempSrc: Axillary  Axillary   SpO2: 95%  99%   Weight:      Height:       Physical Exam   General: Alert, oriented X3  Eyes: Pupils equal, reactive  Oral cavity: moist mucous membranes  Head: Atraumatic, normocephalic  Neck: supple  Chest: clear to auscultation.  Diminished breath sounds CVS: S1,S2 RRR. No murmurs  Abd: No distention, soft, non-tender. No masses palpable  Extr: No edema   MSK: No joint deformities or swelling  Neurological: Grossly intact.    Data Reviewed:     Latest Ref Rng & Units 02/17/2024    2:51 AM 02/16/2024    3:20 AM 02/15/2024   12:33 AM  CBC  WBC 4.0 - 10.5 K/uL 9.0  12.3  13.4   Hemoglobin 12.0 - 15.0 g/dL 87.3  88.3  87.1   Hematocrit 36.0 - 46.0 % 42.0  38.5  43.1   Platelets 150 - 400 K/uL 342  326  369       Latest Ref  Rng & Units 02/17/2024    2:51 AM 02/16/2024    3:20 AM 02/15/2024   12:33 AM  BMP  Glucose 70 - 99 mg/dL 838  899  872   BUN 8 - 23 mg/dL 24  23  17    Creatinine 0.44 - 1.00 mg/dL 8.86  8.96  9.14   Sodium 135 - 145 mmol/L 135  137  140   Potassium 3.5 - 5.1 mmol/L 4.6  5.0  3.8   Chloride 98 - 111 mmol/L 105  104  112   CO2 22 - 32 mmol/L 18  26  16    Calcium  8.9 - 10.3 mg/dL 9.3  8.9  9.1      Family Communication: n/a  Disposition: Status is:  Inpatient Remains inpatient appropriate because: Ongoing management of A-fib with RVR, COPD exacerbation.  Planned Discharge Destination: Home DVT PPx: On systemic anticoagulation with Eliquis .     Time spent: 40 minutes  Author: MDALA-GAUSI, Theresa Reiland [name], MD 02/17/2024 12:44 PM  For on call review [email or web address].

## 2024-02-18 DIAGNOSIS — R072 Precordial pain: Secondary | ICD-10-CM

## 2024-02-18 DIAGNOSIS — I4891 Unspecified atrial fibrillation: Secondary | ICD-10-CM | POA: Diagnosis not present

## 2024-02-18 DIAGNOSIS — E1165 Type 2 diabetes mellitus with hyperglycemia: Secondary | ICD-10-CM

## 2024-02-18 DIAGNOSIS — I359 Nonrheumatic aortic valve disorder, unspecified: Secondary | ICD-10-CM

## 2024-02-18 DIAGNOSIS — I10 Essential (primary) hypertension: Secondary | ICD-10-CM

## 2024-02-18 DIAGNOSIS — I5032 Chronic diastolic (congestive) heart failure: Secondary | ICD-10-CM

## 2024-02-18 DIAGNOSIS — E7800 Pure hypercholesterolemia, unspecified: Secondary | ICD-10-CM

## 2024-02-18 DIAGNOSIS — I4821 Permanent atrial fibrillation: Secondary | ICD-10-CM | POA: Diagnosis not present

## 2024-02-18 DIAGNOSIS — E1169 Type 2 diabetes mellitus with other specified complication: Secondary | ICD-10-CM | POA: Insufficient documentation

## 2024-02-18 LAB — BASIC METABOLIC PANEL WITH GFR
Anion gap: 10 (ref 5–15)
BUN: 35 mg/dL — ABNORMAL HIGH (ref 8–23)
CO2: 22 mmol/L (ref 22–32)
Calcium: 8.8 mg/dL — ABNORMAL LOW (ref 8.9–10.3)
Chloride: 102 mmol/L (ref 98–111)
Creatinine, Ser: 1.28 mg/dL — ABNORMAL HIGH (ref 0.44–1.00)
GFR, Estimated: 43 mL/min — ABNORMAL LOW (ref >60)
Glucose, Bld: 176 mg/dL — ABNORMAL HIGH (ref 70–99)
Potassium: 4.1 mmol/L (ref 3.5–5.1)
Sodium: 134 mmol/L — ABNORMAL LOW (ref 135–145)

## 2024-02-18 LAB — CBC
HCT: 40.6 % (ref 36.0–46.0)
Hemoglobin: 12 g/dL (ref 12.0–15.0)
MCH: 22.6 pg — ABNORMAL LOW (ref 26.0–34.0)
MCHC: 29.6 g/dL — ABNORMAL LOW (ref 30.0–36.0)
MCV: 76.3 fL — ABNORMAL LOW (ref 80.0–100.0)
Platelets: 338 K/uL (ref 150–400)
RBC: 5.32 MIL/uL — ABNORMAL HIGH (ref 3.87–5.11)
RDW: 19.1 % — ABNORMAL HIGH (ref 11.5–15.5)
WBC: 13.9 K/uL — ABNORMAL HIGH (ref 4.0–10.5)
nRBC: 0 % (ref 0.0–0.2)

## 2024-02-18 LAB — GLUCOSE, CAPILLARY
Glucose-Capillary: 106 mg/dL — ABNORMAL HIGH (ref 70–99)
Glucose-Capillary: 149 mg/dL — ABNORMAL HIGH (ref 70–99)
Glucose-Capillary: 144 mg/dL — ABNORMAL HIGH (ref 70–99)
Glucose-Capillary: 139 mg/dL — ABNORMAL HIGH (ref 70–99)

## 2024-02-18 LAB — MAGNESIUM: Magnesium: 2.2 mg/dL (ref 1.7–2.4)

## 2024-02-18 MED ORDER — CALCIUM CARBONATE ANTACID 500 MG PO CHEW: 1.0000 | CHEWABLE_TABLET | Freq: Four times a day (QID) | ORAL | Status: DC | PRN

## 2024-02-18 MED ORDER — DILTIAZEM HCL ER COATED BEADS 240 MG PO CP24
240.0000 mg | ORAL_CAPSULE | Freq: Every day | ORAL | Status: DC
  Administered 2024-02-18 – 2024-02-20 (×3): 240 mg via ORAL
  Filled 2024-02-18 (×3): qty 1

## 2024-02-18 NOTE — Plan of Care (Signed)
  Problem: Coping: Goal: Ability to adjust to condition or change in health will improve Outcome: Progressing   Problem: Metabolic: Goal: Ability to maintain appropriate glucose levels will improve Outcome: Progressing   Problem: Nutritional: Goal: Maintenance of adequate nutrition will improve Outcome: Progressing   Problem: Clinical Measurements: Goal: Ability to maintain clinical measurements within normal limits will improve Outcome: Progressing

## 2024-02-18 NOTE — Progress Notes (Signed)
 Progress Note   Patient: Theresa Mendoza FMW:[id number] DOB: 30-Nov-1945 DOA: 02/14/2024     3 DOS: the patient was seen and examined on 02/18/2024   Brief hospital course: 78 year old woman with PMH of permanent A-fib on Eliquis , moderate AS/mild MS, pulmonary hypertension, RBBB,hypertension, hyperlipidemia, CKD3a, HFpEF, rheumatoid arthritis, anxiety/depression/BPD, COPD, glaucoma, migraine headaches, mild cognitive impairment, type 2 diabetes, chronic back pain who presented with a chief complaint of chest pain and sent to the ED by PCP. In the ED noted to have heart rate 160s A-fib with RVR otherwise vitals stable- EKG did show some ST elevations in inferior leads and was reviewed with cardiologist who did not feel that it met STEMI criteria. Labs notable for WBC count 12.9 (mildly elevated on previous labs as well), bicarb 21, creatinine 1.0 (stable), initial troponin negative .Chest x-ray NAD, cardiology consulted placed on Cardizem  and admitted.   Assessment and Plan:  Permanent A-fib with RVR: Likely triggered by COPD exacerbation. Cardiology consulted, input appreciated. Patient was started on Cardizem  drip, subsequently transitioned to p.o. Cardizem . Per cardiology, patient would likely not benefit from cardioversion given permanent atrial fibrillation. Patient received a dose of digoxin . - Continue home Toprol . - Oral Cardizem  consolidated to 240 mg daily. - Continue home Eliquis . - Possible discharge home in a.m. if heart rate remains stable.  Chest pain: Likely from A-fib with RVR troponin negative x 2. Chest pain has resolved. - Continue management as indicated above.  COPD with exacerbation Patient complains of shortness of breath. - Continue home inhalers. - Scheduled levalbuterol . - Prednisone  burst x 5 days. - Continue Augmentin .   Chronic CHF and valvular disease-moderate AAS/mild MS Does not appear to be in exacerbation. - Continue home Lasix , Jardiance ,  losartan , metoprolol .  Hypertension Stable BP, continue Cardizem  drip and metoprolol .   Type 2 diabetes Last A1c 7.6 in March 2025. -Continue SSI.   Hyperlipidemia: Continue statins   Rheumatoid arthritis Anxiety/depression/bipolar disorder Glaucoma: -Continue home medications. - As needed hydroxyzine  for anxiety.  Chronic pain.  On chronic narcotics at home.  - Continuing home Norco.       Subjective: Patient says she is doing better.  She was seen by the mobility specialist yesterday and was noted to be tachycardic with activity.  Physical Exam: Vitals:   02/17/24 2342 02/18/24 0444 02/18/24 0748 02/18/24 1106  BP: (!) 142/91 (!) 128/52 (!) 150/51 (!) 137/96  Pulse: 80 70 71 73  Resp: 20 20 18 20   Temp: 98.5 F (36.9 C) 97.6 F (36.4 C) 97.7 F (36.5 C) 98.1 F (36.7 C)  TempSrc: Oral Oral Oral Oral  SpO2: 99% 98% 98% 98%  Weight:  69.9 kg    Height:       Physical Exam   General: Alert, oriented X3  Eyes: Pupils equal, reactive  Oral cavity: moist mucous membranes  Head: Atraumatic, normocephalic  Neck: supple  Chest: clear to auscultation.  Diminished breath sounds CVS: S1,S2 irregular Abd: No distention, soft, non-tender. No masses palpable  Extr: No edema   MSK: No joint deformities or swelling  Neurological: Grossly intact.    Data Reviewed:     Latest Ref Rng & Units 02/18/2024    3:09 AM 02/17/2024    2:51 AM 02/16/2024    3:20 AM  CBC  WBC 4.0 - 10.5 K/uL 13.9  9.0  12.3   Hemoglobin 12.0 - 15.0 g/dL 87.9  87.3  88.3   Hematocrit 36.0 - 46.0 % 40.6  42.0  38.5  Platelets 150 - 400 K/uL 338  342  326       Latest Ref Rng & Units 02/18/2024    3:09 AM 02/17/2024    2:51 AM 02/16/2024    3:20 AM  BMP  Glucose 70 - 99 mg/dL 823  838  899   BUN 8 - 23 mg/dL 35  24  23   Creatinine 0.44 - 1.00 mg/dL 8.71  8.86  8.96   Sodium 135 - 145 mmol/L 134  135  137   Potassium 3.5 - 5.1 mmol/L 4.1  4.6  5.0   Chloride 98 - 111 mmol/L 102  105  104    CO2 22 - 32 mmol/L 22  18  26    Calcium  8.9 - 10.3 mg/dL 8.8  9.3  8.9      Family Communication: n/a  Disposition: Status is: Inpatient Remains inpatient appropriate because: Ongoing management of A-fib with RVR, COPD exacerbation.  Planned Discharge Destination: Home DVT PPx: On systemic anticoagulation with Eliquis .     Time spent: 40 minutes  Author: MDALA-GAUSI, Kairos Panetta [name], MD 02/18/2024 1:35 PM  For on call review [email or web address].

## 2024-02-18 NOTE — Progress Notes (Signed)
 Mobility Specialist Progress Note:    02/18/24 1641  Mobility  Activity Ambulated independently  Level of Assistance Independent  Assistive Device None  Distance Ambulated (ft) 500 ft  Activity Response Tolerated well  Mobility Referral Yes  Mobility visit 1 Mobility  Mobility Specialist Start Time (ACUTE ONLY) 1641  Mobility Specialist Stop Time (ACUTE ONLY) 1652  Mobility Specialist Time Calculation (min) (ACUTE ONLY) 11 min   Pt pleasant and agreeable to session. Able to move well w/o the need of assistance. No c/o any symptoms but halfway through walk, pt breathing became more labored. Had pt perform PLB which helped out. Left pt on EOB w/ all needs met. RN notified.   Venetia Keel Mobility Specialist Please Neurosurgeon or Rehab Office at [phone]

## 2024-02-18 NOTE — TOC Initial Note (Signed)
 Transition of Care [facility]) - Initial/Assessment Note    Patient Details  Name: Theresa Mendoza MRN: [id number] Date of Birth: 1946/04/29  Transition of Care [facility]) CM/SW Contact:    Waddell Barnie Rama, RN Phone Number: 02/18/2024, 5:58 PM  Clinical Narrative:                 From home with son, has PCP and insurance on file, states has no HH services in place at this time , has walker, cane, home oxygen  not sure how many liters uses as needed at home.  States family member will transport them home at [employer] and family is support system, [facility] .  Pta self ambulatory.   There are no IP CM needs identified  at this time.  Please place consult for IP CM  needs.    Expected Discharge Plan: Home/Self Care Barriers to Discharge: Continued Medical Work up   Patient Goals and CMS Choice Patient states their goals for this hospitalization and ongoing recovery are:: get better   Choice offered to / list presented to : NA      Expected Discharge Plan and Services In-house Referral: NA Discharge Planning Services: CM Consult Post Acute Care Choice: NA Living arrangements for the past 2 months: Single Family Home                 DME Arranged: N/A DME Agency: NA       HH Arranged: NA          Prior Living Arrangements/Services Living arrangements for the past 2 months: Single Family Home Lives with:: Adult Children Patient language and need for interpreter reviewed:: Yes Do you feel safe going back to the place where you live?: Yes      Need for Family Participation in Patient Care: Yes (Comment) Care giver support system in place?: Yes (comment) Current home services: DME (walker, cane, home oxygen  not sure how many liters uses as needed.) Criminal Activity/Legal Involvement Pertinent to Current Situation/Hospitalization: No - Comment as needed  Activities of Daily Living   ADL Screening (condition at time of admission) Independently performs ADLs?:  Yes (appropriate for developmental age) Is the patient deaf or have difficulty hearing?: No Does the patient have difficulty seeing, even when wearing glasses/contacts?: No Does the patient have difficulty concentrating, remembering, or making decisions?: No  Permission Sought/Granted Permission sought to share information with : Case Manager Permission granted to share information with : Yes, Verbal Permission Granted  Share Information with NAME: Fairy     Permission granted to share info w Relationship: son     Emotional Assessment Appearance:: Appears stated age Attitude/Demeanor/Rapport: Engaged Affect (typically observed): Appropriate Orientation: : Oriented to Self, Oriented to Place, Oriented to  Time, Oriented to Situation Alcohol / Substance Use: Not Applicable Psych Involvement: No (comment)  Admission diagnosis:  Chest pressure [R07.89] Atrial fibrillation with rapid ventricular response (HCC) [I48.91] Atrial fibrillation with RVR (HCC) [I48.91] Patient Active Problem List   Diagnosis Date Noted   Aortic valve disease 02/18/2024   Type 2 diabetes mellitus with hyperglycemia (HCC) 02/18/2024   Benign hypertension 02/18/2024   Atrial fibrillation with RVR (HCC) 02/14/2024   Aortic stenosis 11/06/2023   Stage 3a chronic kidney disease (HCC) 11/06/2023   Chest pain 09/22/2023   Diabetes mellitus type II, non insulin  dependent (HCC) 10/14/2021   COPD exacerbation (HCC) 10/14/2021   Acute exacerbation of chronic obstructive pulmonary disease (COPD) (HCC) 10/13/2021   Chronic combined systolic and  diastolic CHF (congestive heart failure) (HCC) 10/13/2021   Depression with anxiety 10/13/2021   Chronic back pain 10/13/2021   Pain due to onychomycosis of toenails of both feet 10/20/2020   Coagulation disorder (HCC) 10/20/2020   Chronic CHF (congestive heart failure) (HCC)    Essential hypertension    Major depressive disorder, recurrent episode, moderate (HCC) 10/09/2020    Heart failure with improved ejection fraction (HFimpEF) (HCC) 10/07/2020   Encounter for tobacco use cessation counseling 12/04/2017   Chronic respiratory failure with hypoxia (HCC) 06/18/2017   COPD (chronic obstructive pulmonary disease) (HCC) 08/24/2016   Chronic bronchitis (HCC) 08/24/2016   Hypersomnia 08/24/2016   Hyperlipidemia 12/25/2015   CAD (coronary artery disease), native coronary artery 12/24/2015   Old MI (myocardial infarction) 12/24/2015   Permanent atrial fibrillation (HCC) 12/24/2015   Cognitive and behavioral changes 12/15/2015   Tobacco use disorder 12/15/2015   PCP:  Regino Slater, MD Pharmacy:   [phone] [place], [place] - [address] [place] [address] Phone: [phone] Fax: [phone]  [facility] [address] [place] [address] Phone: [phone] Fax: [phone]  [id number] [place], [place] - [address] [place] [address] Phone: [phone] Fax: [phone]  [facility] [place] [facility] [address] [place] [address] Phone: [phone] Fax: [phone]     Social Drivers of Health (SDOH) Social History: SDOH Screenings   Food Insecurity: No Food Insecurity (02/15/2024)  Housing: Low Risk  (02/15/2024)  Transportation Needs: No Transportation Needs (02/15/2024)  Utilities: Not At Risk (02/15/2024)  Alcohol Screen: Low Risk  (10/13/2020)  Depression (PHQ2-9): Low Risk  (01/24/2024)  Financial Resource Strain: Low Risk  (01/24/2024)  Physical Activity: Inactive (01/24/2024)  Social Connections: Socially Isolated (02/15/2024)  Tobacco Use: High Risk (02/15/2024)   SDOH Interventions:     Readmission Risk Interventions    02/18/2024    5:56 PM 09/24/2023   12:34 PM  Readmission Risk Prevention Plan  Post Dischage Appt  Complete  Medication Screening  Complete  Transportation Screening  Complete Complete  HRI or Home Care Consult Complete   Palliative Care Screening Not Applicable   Medication Review (RN Care Manager) Complete

## 2024-02-18 NOTE — Progress Notes (Addendum)
 Rounding Note   Patient Name: Theresa Mendoza Date of Encounter: 02/18/2024  [facility] Cardiologist: Wilbert Bihari, MD   Subjective Doing much better since admission. Denied lightheadedness, dizziness, orthopnea, pnd, and peripheral edema  She did have some chest heaviness last night while the was awake unable to sleep due to anxiety.   Scheduled Meds:  amoxicillin -clavulanate  1 tablet Oral BID   apixaban   5 mg Oral BID   diltiazem   240 mg Oral Daily   DULoxetine   120 mg Oral Daily   empagliflozin   25 mg Oral Daily   furosemide   20 mg Oral QODAY   insulin  aspart  0-5 Units Subcutaneous QHS   insulin  aspart  0-9 Units Subcutaneous TID WC   lidocaine   1 patch Transdermal Daily   LORazepam   2 mg Oral Once   losartan   25 mg Oral Daily   metoprolol  succinate  100 mg Oral Daily   pantoprazole   40 mg Oral Daily   pravastatin   40 mg Oral Daily   predniSONE   40 mg Oral Q breakfast   umeclidinium-vilanterol  1 puff Inhalation Daily   Continuous Infusions:  PRN Meds: acetaminophen  **OR** acetaminophen , calcium  carbonate, guaiFENesin -dextromethorphan , HYDROcodone -acetaminophen , hydrOXYzine , levalbuterol , naLOXone  (NARCAN )  injection, ondansetron  (ZOFRAN ) IV   Vital Signs  Vitals:   02/17/24 2342 02/18/24 0444 02/18/24 0748 02/18/24 1106  BP: (!) 142/91 (!) 128/52 (!) 150/51 (!) 137/96  Pulse: 80 70 71 73  Resp: 20 20 18 20   Temp: 98.5 F (36.9 C) 97.6 F (36.4 C) 97.7 F (36.5 C) 98.1 F (36.7 C)  TempSrc: Oral Oral Oral Oral  SpO2: 99% 98% 98% 98%  Weight:  69.9 kg    Height:        Intake/Output Summary (Last 24 hours) at 02/18/2024 1408 Last data filed at 02/18/2024 1301 Gross per 24 hour  Intake 974 ml  Output 2500 ml  Net -1526 ml      02/18/2024    4:44 AM 02/17/2024    3:10 AM 02/16/2024    4:36 AM  Last 3 Weights  Weight (lbs) 154 lb 1.6 oz 153 lb 11.2 oz 153 lb  Weight (kg) 69.9 kg 69.718 kg 69.4 kg      Telemetry Atrial Fibrillation with avg  HR ~80, one 2s pause - Personally Reviewed   Physical Exam GEN: No acute distress.   Neck: No carotid bruits Cardiac: Irregular rhythm, and normal rate, no murmurs, rubs, or gallops.  Respiratory: Occasional wheezing MS: No edema; No deformity. Neuro:  Nonfocal  Psych: Normal affect   Labs High Sensitivity Troponin:   Recent Labs  Lab 02/14/24 1746 02/15/24 0033  TROPONINIHS 13 12     Chemistry Recent Labs  Lab 02/15/24 0033 02/16/24 0320 02/17/24 0251 02/18/24 0309  NA 140 137 135 134*  K 3.8 5.0 4.6 4.1  CL 112* 104 105 102  CO2 16* 26 18* 22  GLUCOSE 127* 100* 161* 176*  BUN 17 23 24* 35*  CREATININE 0.85 1.03* 1.13* 1.28*  CALCIUM  9.1 8.9 9.3 8.8*  MG  --   --   --  2.2  PROT 6.4*  --   --   --   ALBUMIN 3.4*  --   --   --   AST 16  --   --   --   ALT 14  --   --   --   ALKPHOS 76  --   --   --   BILITOT 0.7  --   --   --  GFRNONAA >60 56* 50* 43*  ANIONGAP 12 7 12 10     Lipids No results for input(s): CHOL, TRIG, HDL, LABVLDL, LDLCALC, CHOLHDL in the last 168 hours.  Hematology Recent Labs  Lab 02/16/24 0320 02/17/24 0251 02/18/24 0309  WBC 12.3* 9.0 13.9*  RBC 5.11 5.59* 5.32*  HGB 11.6* 12.6 12.0  HCT 38.5 42.0 40.6  MCV 75.3* 75.1* 76.3*  MCH 22.7* 22.5* 22.6*  MCHC 30.1 30.0 29.6*  RDW 19.5* 19.3* 19.1*  PLT 326 342 338   Thyroid  No results for input(s): TSH, FREET4 in the last 168 hours.  BNP Recent Labs  Lab 02/15/24 0033  BNP 376.4*    DDimer No results for input(s): DDIMER in the last 168 hours.   Radiology  No results found.   Patient Profile   78 y.o. female with a past medical history of permanent atrial fibrillation on eliquis , hx of NSTEMI in 2016 with LHC showing normal coronaries, HFimEF ( 40-45% in 2022; 60-65% in 2025), T2DM, Mild-Moderate AS, Mild MR, chronic RBBB, hyperlipidemia, pulmonary hypertension, COPD, mild cognitive impairment and RA who presented to her PCP on 7/31 for follow-up after  having nausea with antibiotics that were started for a recent COPD exacerbation. She was found to be in AF RVR and was told to present to the ED where she was placed on IV dilt gtt. Cardiology was consulted.   Assessment & Plan  Permanent atrial fibrillation, episode of RVR Has not tolerated digoxin  previously 2/2 bradycardia.  PTA medications Toprol  100 mg, diltiazem  240 mg, and eliquis  5 mg  She is now rate controlled.  Continue Toprol  XL 100 mg Continue Eliquis  5 mg Consolidate and restart diltiazem  240 mg   Magnesium  pending  Atypical chest pain Recent admission 09/2023 for chest pain that was not thought to be ACS in etiology. She was recommended to have outpatient stress testing. In the outpatient setting patient through shared decision making the patient opted to defer further ischemic evaluation given lack of reoccurrence of chest pain.  This admission patient presented with chest pain worse with inspiration.  Troponin negative Suspected chest pain this admission 2/2 to PNA/COPD exacerbation  She did have an episode of noncardiac chest pain overnight.  She reported that she was unable to sleep last night due to ongoing thoughts about life. I suspect this chest heaviness was anxiety induced. Will defer inpatient ischemic evaluation.  Chronic HFimpEF Last echocardiogram March 2025 showed improved EF 60-65% from previously reduced EF 40-45% in 2022. BNP on admission 376  Net - 3L this admission On exam appears euvolemic Continue toprol  XL 100 mg Continue Cozaar  25 mg Continue jardiance  25 mg Continue lasix  20 mg every other day  Mild to Moderate AS Mild MR Continue to monitor outpatient   Hypertension BP: 139/96 BP has not been controlled recently per outpatient report. BB was increased at last OP visit on 11/21/23. Will hold on adjusting medications today as we are restarting home diltiazem  dose.  Continue cozaar  25 mg Continue Toprol  XL 100 mg Consolidate and restart  diltiazem  240 mg  Hyperlipidemia Continue pravastatin  40 mg  CKD (Baseline ~ 1.2) Cr today 1.28  Per primary COPD exacerbation T2DM    For questions or updates, please contact [facility] Please consult [email or web address] for contact info under     Signed, Leontine LOISE Salen, PA-C  02/18/2024, 10:34 AM    ADDENDUM:   Patient seen and examined with Leontine Salen PA-C.  I personally taken a history, examined the  patient, reviewed relevant notes,  laboratory data / imaging studies.  I performed a substantive portion of this encounter and formulated the important aspects of the plan.  I agree with the APP's note, impression, and recommendations; however, I have edited the note to reflect changes or salient points.   Patient seen and examined at bedside. No family present Denies anginal chest pain or heart failure symptoms.  PHYSICAL EXAM: Today's Vitals   02/17/24 2342 02/18/24 0444 02/18/24 0748 02/18/24 1106  BP: (!) 142/91 (!) 128/52 (!) 150/51 (!) 137/96  Pulse: 80 70 71 73  Resp: 20 20 18 20   Temp: 98.5 F (36.9 C) 97.6 F (36.4 C) 97.7 F (36.5 C) 98.1 F (36.7 C)  TempSrc: Oral Oral Oral Oral  SpO2: 99% 98% 98% 98%  Weight:  69.9 kg    Height:      PainSc:       Body mass index is 25.64 kg/m.   Net IO Since Admission: -4,015.33 mL [02/18/24 1408]  Filed Weights   02/16/24 0436 02/17/24 0310 02/18/24 0444  Weight: 69.4 kg 69.7 kg 69.9 kg    General: Age-appropriate, hemodynamically stable, no acute distress HEENT: Normocephalic, atraumatic, poor dentition, no JVP Lungs: Clear to auscultation bilaterally, no wheezes rales or rhonchi's. Heart: Irregularly irregular, variable S1-S2, ejection murmur, no rubs or gallops Abdomen: Soft, nontender, nondistended, positive bowel sounds in all 4 quadrants Extremities: Warm, no edema  Telemetry: (personally reviewed by me) A-fib with controlled ventricular rate   Impression & Recommendations:  Permanent  atrial fibrillation, now controlled ventricular rate Precordial pain, noncardiac Chronic heart failure with improved EF Aortic valve disease. Hypertension. Hyperlipidemia. CKD. Diabetes  With regards to her atrial fibrillation her rates are now better controlled.  Currently on Eliquis  5 mg p.o. twice daily, Cardizem  240 mg p.o. daily, Toprol -XL 100 mg p.o. daily.  Did not tolerate digoxin  in the past.  Precordial pain predominantly noncardiac.  But present for several months now & she described them as heaviness during her prior hospitalizations and was recommended to have outpatient stress test.  However by the time she followed up she was asymptomatic and the shared decision was to hold off.  There were symptoms are predominately noncardiac she has multiple cardiovascular risk factors.  Given its recurrence we discussed considering pharmacological stress prior to discharge.  Patient states that she would like to hold off for now.  To make sure that she is making an informed consent patient provides verbal consent for me to discuss the recommendations with her son over the phone.  Continue GDMT as discussed above given her heart failure with improved EF.  Continue pravastatin  40 mg p.o. nightly for hyperlipidemia management.  Medical Decision:  Complexity: High Interdisciplinary: see above.  Independently reviewed: vital signs, echo, EKG, telemetry, labs.  Prescription drug management -recommendations stated above Family updated: Spoke to her son over the phone at [phone]  Further recommendations to follow as the case evolves.   This note was created using a voice recognition software as a result there may be grammatical errors inadvertently enclosed that do not reflect the nature of this encounter. Every attempt is made to correct such errors.   [name], [facility]  A Division of [facility] [address] [place], [place] [address]   [place], [place] [address] Pager: [phone] Office: [phone] 02/18/2024 2:08 PM   Addendum: Was informed by the attending that patient wants to proceed w/ stress test.  I  assume this is after patient and son spoke over the phone after discussion. Will schedule for Lexiscan  stress test tomorrow. N.p.o. after midnight.  Informed Consent   Shared Decision Making/Informed Consent The risks [chest pain, shortness of breath, cardiac arrhythmias, dizziness, blood pressure fluctuations, myocardial infarction, stroke/transient ischemic attack, nausea, vomiting, allergic reaction, radiation exposure, metallic taste sensation and life-threatening complications (estimated to be 1 in 10,000)], benefits (risk stratification, diagnosing coronary artery disease, treatment guidance) and alternatives of a nuclear stress test were discussed in detail with Ms. Walski and she agrees to proceed.    Kaicee Scarpino [place], DO, FACC 7:09 PM 02/18/24

## 2024-02-19 ENCOUNTER — Inpatient Hospital Stay (HOSPITAL_COMMUNITY)

## 2024-02-19 DIAGNOSIS — I4891 Unspecified atrial fibrillation: Secondary | ICD-10-CM | POA: Diagnosis not present

## 2024-02-19 DIAGNOSIS — I4821 Permanent atrial fibrillation: Secondary | ICD-10-CM | POA: Diagnosis not present

## 2024-02-19 DIAGNOSIS — I359 Nonrheumatic aortic valve disorder, unspecified: Secondary | ICD-10-CM | POA: Diagnosis not present

## 2024-02-19 DIAGNOSIS — R072 Precordial pain: Secondary | ICD-10-CM

## 2024-02-19 DIAGNOSIS — I5032 Chronic diastolic (congestive) heart failure: Secondary | ICD-10-CM | POA: Diagnosis not present

## 2024-02-19 LAB — GLUCOSE, CAPILLARY
Glucose-Capillary: 103 mg/dL — ABNORMAL HIGH (ref 70–99)
Glucose-Capillary: 116 mg/dL — ABNORMAL HIGH (ref 70–99)
Glucose-Capillary: 98 mg/dL (ref 70–99)
Glucose-Capillary: 197 mg/dL — ABNORMAL HIGH (ref 70–99)

## 2024-02-19 LAB — CBC
HCT: 40.9 % (ref 36.0–46.0)
Hemoglobin: 12.1 g/dL (ref 12.0–15.0)
MCH: 22.5 pg — ABNORMAL LOW (ref 26.0–34.0)
MCHC: 29.6 g/dL — ABNORMAL LOW (ref 30.0–36.0)
MCV: 76 fL — ABNORMAL LOW (ref 80.0–100.0)
Platelets: 378 K/uL (ref 150–400)
RBC: 5.38 MIL/uL — ABNORMAL HIGH (ref 3.87–5.11)
RDW: 19 % — ABNORMAL HIGH (ref 11.5–15.5)
WBC: 14 K/uL — ABNORMAL HIGH (ref 4.0–10.5)
nRBC: 0 % (ref 0.0–0.2)

## 2024-02-19 LAB — BASIC METABOLIC PANEL WITH GFR
Anion gap: 8 (ref 5–15)
BUN: 34 mg/dL — ABNORMAL HIGH (ref 8–23)
CO2: 25 mmol/L (ref 22–32)
Calcium: 9.2 mg/dL (ref 8.9–10.3)
Chloride: 107 mmol/L (ref 98–111)
Creatinine, Ser: 1.09 mg/dL — ABNORMAL HIGH (ref 0.44–1.00)
GFR, Estimated: 53 mL/min — ABNORMAL LOW (ref >60)
Glucose, Bld: 95 mg/dL (ref 70–99)
Potassium: 4.8 mmol/L (ref 3.5–5.1)
Sodium: 140 mmol/L (ref 135–145)

## 2024-02-19 MED ORDER — REGADENOSON 0.4 MG/5ML IV SOLN
INTRAVENOUS | Status: AC
  Filled 2024-02-19: qty 5

## 2024-02-19 MED ORDER — REGADENOSON 0.4 MG/5ML IV SOLN
0.4000 mg | Freq: Once | INTRAVENOUS | Status: AC
  Administered 2024-02-19: 0.4 mg via INTRAVENOUS

## 2024-02-19 NOTE — Progress Notes (Signed)
     Rock ORN Seibert presented for a  nuclear stress test with lexiscan  today.  I Morse Clause, PA-C, provided direct supervision and was present during the stress portion of the study today, which was completed without significant symptoms, immediate complications, or acute ST/T changes on ECG. During the test patient did report feeling nauseated and a headache but denied chest pain and shortness of breath. Did also become hypotensive with a BP of 83/42 but this improved to 130/59 when rechecked.  Stress imaging is pending at this time.  Preliminary ECG findings may be listed in the chart, but the stress test result will not be finalized until perfusion imaging is complete.  Sharese Manrique, PA-C  02/19/2024, 12:21 PM

## 2024-02-19 NOTE — Progress Notes (Signed)
 Spoke with pt's son/POA [name] via phone call and provided update as to pt's current status. [name] would like a call if discharge orders are entered today so he can arrange for transportation and someone to be at home when patient arrives.

## 2024-02-19 NOTE — Progress Notes (Signed)
 Rounding Note   Patient Name: Theresa Mendoza Date of Encounter: 02/19/2024  [facility] Cardiologist: Wilbert Bihari, MD   Subjective Resting in bed comfortably. Denies anginal chest pain or heart failure symptoms  Scheduled Meds:  amoxicillin -clavulanate  1 tablet Oral BID   apixaban   5 mg Oral BID   diltiazem   240 mg Oral Daily   DULoxetine   120 mg Oral Daily   empagliflozin   25 mg Oral Daily   furosemide   20 mg Oral QODAY   insulin  aspart  0-5 Units Subcutaneous QHS   insulin  aspart  0-9 Units Subcutaneous TID WC   lidocaine   1 patch Transdermal Daily   LORazepam   2 mg Oral Once   losartan   25 mg Oral Daily   metoprolol  succinate  100 mg Oral Daily   pantoprazole   40 mg Oral Daily   pravastatin   40 mg Oral Daily   predniSONE   40 mg Oral Q breakfast   umeclidinium-vilanterol  1 puff Inhalation Daily   Continuous Infusions:  PRN Meds: acetaminophen  **OR** acetaminophen , calcium  carbonate, guaiFENesin -dextromethorphan , HYDROcodone -acetaminophen , hydrOXYzine , levalbuterol , naLOXone  (NARCAN )  injection, ondansetron  (ZOFRAN ) IV   Vital Signs  Vitals:   02/19/24 1129 02/19/24 1246 02/19/24 1435 02/19/24 1601  BP: (!) 123/48 (!) 128/45 124/77 (!) 123/58  Pulse: 74 77 77 88  Resp:  20  20  Temp:  (!) 97.5 F (36.4 C)  (!) 97.5 F (36.4 C)  TempSrc:  Oral  Oral  SpO2:  98%  98%  Weight:      Height:        Intake/Output Summary (Last 24 hours) at 02/19/2024 1732 Last data filed at 02/19/2024 1603 Gross per 24 hour  Intake 354 ml  Output 400 ml  Net -46 ml      02/19/2024    4:38 AM 02/18/2024    4:44 AM 02/17/2024    3:10 AM  Last 3 Weights  Weight (lbs) 152 lb 11.2 oz 154 lb 1.6 oz 153 lb 11.2 oz  Weight (kg) 69.264 kg 69.9 kg 69.718 kg      Telemetry Atrial fibrillation with controlled ventricular rate- Personally Reviewed   Physical Exam GEN: No acute distress.   Neck: No carotid bruits Cardiac: Irregular rhythm, and normal rate, no murmurs,  rubs, or gallops.  Respiratory: Occasional wheezing MS: No edema; No deformity. Neuro:  Nonfocal  Psych: Normal affect   Labs High Sensitivity Troponin:   Recent Labs  Lab 02/14/24 1746 02/15/24 0033  TROPONINIHS 13 12     Chemistry Recent Labs  Lab 02/15/24 0033 02/16/24 0320 02/17/24 0251 02/18/24 0309 02/19/24 0311  NA 140   < > 135 134* 140  K 3.8   < > 4.6 4.1 4.8  CL 112*   < > 105 102 107  CO2 16*   < > 18* 22 25  GLUCOSE 127*   < > 161* 176* 95  BUN 17   < > 24* 35* 34*  CREATININE 0.85   < > 1.13* 1.28* 1.09*  CALCIUM  9.1   < > 9.3 8.8* 9.2  MG  --   --   --  2.2  --   PROT 6.4*  --   --   --   --   ALBUMIN 3.4*  --   --   --   --   AST 16  --   --   --   --   ALT 14  --   --   --   --  ALKPHOS 76  --   --   --   --   BILITOT 0.7  --   --   --   --   GFRNONAA >60   < > 50* 43* 53*  ANIONGAP 12   < > 12 10 8    < > = values in this interval not displayed.    Lipids No results for input(s): CHOL, TRIG, HDL, LABVLDL, LDLCALC, CHOLHDL in the last 168 hours.  Hematology Recent Labs  Lab 02/17/24 0251 02/18/24 0309 02/19/24 0311  WBC 9.0 13.9* 14.0*  RBC 5.59* 5.32* 5.38*  HGB 12.6 12.0 12.1  HCT 42.0 40.6 40.9  MCV 75.1* 76.3* 76.0*  MCH 22.5* 22.6* 22.5*  MCHC 30.0 29.6* 29.6*  RDW 19.3* 19.1* 19.0*  PLT 342 338 378   Thyroid  No results for input(s): TSH, FREET4 in the last 168 hours.  BNP Recent Labs  Lab 02/15/24 0033  BNP 376.4*    DDimer No results for input(s): DDIMER in the last 168 hours.   Radiology  No results found.   Patient Profile   78 y.o. female with a past medical history of permanent atrial fibrillation on eliquis , hx of NSTEMI in 2016 with LHC showing normal coronaries, HFimEF ( 40-45% in 2022; 60-65% in 2025), T2DM, Mild-Moderate AS, Mild MR, chronic RBBB, hyperlipidemia, pulmonary hypertension, COPD, mild cognitive impairment and RA who presented to her PCP on 7/31 for follow-up after having nausea with  antibiotics that were started for a recent COPD exacerbation. She was found to be in AF RVR and was told to present to the ED where she was placed on IV dilt gtt. Cardiology was consulted.   Assessment & Plan  Permanent atrial fibrillation, episode of RVR Has not tolerated digoxin  previously 2/2 bradycardia.  PTA medications Toprol  100 mg, diltiazem  240 mg, and eliquis  5 mg  She is now rate controlled.  Continue Toprol  XL 100 mg p.o. daily Continue Eliquis  5 mg p.o. twice daily Continue diltiazem  240 mg p.o. daily  Atypical chest pain Pharmacological stress test performed this morning, results still pending. Reached out to radiology.    Chronic HFimpEF Stage C, NYHA class II Last echocardiogram March 2025 showed improved EF 60-65% from previously reduced EF 40-45% in 2022. BNP on admission 376  Net IO Since Admission: -4,087.33 mL [02/19/24 1734] Continue toprol  XL 100 mg p.o. daily Continue Cozaar  25 mg p.o. daily Continue jardiance  25 mg p.o. daily Continue lasix  20 mg every other day  Mild to Moderate AS Mild MR Continue to monitor outpatient   Hypertension Blood pressures are well-controlled. Medications reconciled as mentioned above  Hyperlipidemia Continue pravastatin  40 mg p.o. daily  CKD (Baseline ~ 1.2) Cr today 1.09  Per primary COPD exacerbation T2DM   This note was created using a voice recognition software as a result there may be grammatical errors inadvertently enclosed that do not reflect the nature of this encounter. Every attempt is made to correct such errors.   [name], [facility]  A Division of [place] [facility] [address], [place], [place] [address]  [place], [place] [address] Pager: [phone] Office: [phone] 02/19/2024 5:32 PM

## 2024-02-19 NOTE — Progress Notes (Signed)
 Progress Note   Patient: Theresa Mendoza FMW:[id number] DOB: 16-Oct-1945 DOA: 02/14/2024     4 DOS: the patient was seen and examined on 02/19/2024   Brief hospital course: 78 year old woman with PMH of permanent A-fib on Eliquis , moderate AS/mild MS, pulmonary hypertension, RBBB,hypertension, hyperlipidemia, CKD3a, HFpEF, rheumatoid arthritis, anxiety/depression/BPD, COPD, glaucoma, migraine headaches, mild cognitive impairment, type 2 diabetes, chronic back pain who presented with a chief complaint of chest pain and sent to the ED by PCP. In the ED noted to have heart rate 160s A-fib with RVR otherwise vitals stable- EKG did show some ST elevations in inferior leads and was reviewed with cardiologist who did not feel that it met STEMI criteria. Labs notable for WBC count 12.9 (mildly elevated on previous labs as well), bicarb 21, creatinine 1.0 (stable), initial troponin negative .Chest x-ray NAD, cardiology consulted placed on Cardizem  and admitted.   Assessment and Plan:  Permanent A-fib with RVR: Likely triggered by COPD exacerbation. Cardiology consulted, input appreciated. Patient was started on Cardizem  drip, subsequently transitioned to p.o. Cardizem . Per cardiology, patient would likely not benefit from cardioversion given permanent atrial fibrillation. Patient received a dose of digoxin . - Continue home Toprol . - Oral Cardizem  consolidated to 240 mg daily. - Continue home Eliquis .  Chest pain: Likely from A-fib with RVR troponin negative x 2. Chest pain has resolved. Underwent stress test on 8/5. - Continue management as indicated above. -Home in a.m. if stress test is negative.  COPD with exacerbation Patient complains of shortness of breath. - Continue home inhalers. - Scheduled levalbuterol . - Prednisone  burst x 5 days. - Continue Augmentin .   Chronic CHF and valvular disease-moderate AAS/mild MS Does not appear to be in exacerbation. - Continue home Lasix ,  Jardiance , losartan , metoprolol .  Hypertension Stable BP, continue Cardizem  drip and metoprolol .   Type 2 diabetes Last A1c 7.6 in March 2025. -Continue SSI.   Hyperlipidemia: Continue statins   Rheumatoid arthritis Anxiety/depression/bipolar disorder Glaucoma: -Continue home medications. - As needed hydroxyzine  for anxiety.  Chronic pain.  On chronic narcotics at home.  - Continuing home Norco.       Subjective: Patient is feeling well.  She had a stress test today.  Read is pending.  Physical Exam: Vitals:   02/19/24 1129 02/19/24 1246 02/19/24 1435 02/19/24 1601  BP: (!) 123/48 (!) 128/45 124/77 (!) 123/58  Pulse: 74 77 77 88  Resp:  20  20  Temp:  (!) 97.5 F (36.4 C)  (!) 97.5 F (36.4 C)  TempSrc:  Oral  Oral  SpO2:  98%  98%  Weight:      Height:       Physical Exam   General: Alert, oriented X3  Eyes: Pupils equal, reactive  Oral cavity: moist mucous membranes  Head: Atraumatic, normocephalic  Neck: supple  Chest: clear to auscultation.  Diminished breath sounds CVS: S1,S2 irregular Abd: No distention, soft, non-tender. No masses palpable  Extr: No edema   MSK: No joint deformities or swelling  Neurological: Grossly intact.    Data Reviewed:     Latest Ref Rng & Units 02/19/2024    3:11 AM 02/18/2024    3:09 AM 02/17/2024    2:51 AM  CBC  WBC 4.0 - 10.5 K/uL 14.0  13.9  9.0   Hemoglobin 12.0 - 15.0 g/dL 87.8  87.9  87.3   Hematocrit 36.0 - 46.0 % 40.9  40.6  42.0   Platelets 150 - 400 K/uL 378  338  342  Latest Ref Rng & Units 02/19/2024    3:11 AM 02/18/2024    3:09 AM 02/17/2024    2:51 AM  BMP  Glucose 70 - 99 mg/dL 95  823  838   BUN 8 - 23 mg/dL 34  35  24   Creatinine 0.44 - 1.00 mg/dL 8.90  8.71  8.86   Sodium 135 - 145 mmol/L 140  134  135   Potassium 3.5 - 5.1 mmol/L 4.8  4.1  4.6   Chloride 98 - 111 mmol/L 107  102  105   CO2 22 - 32 mmol/L 25  22  18    Calcium  8.9 - 10.3 mg/dL 9.2  8.8  9.3      Family Communication:  n/a  Disposition: Status is: Inpatient Remains inpatient appropriate because: Ongoing management of A-fib with RVR, COPD exacerbation.  Planned Discharge Destination: Home DVT PPx: On systemic anticoagulation with Eliquis .     Time spent: 35 minutes  Author: MDALA-GAUSI, Theresa Sedivy [name], MD 02/19/2024 5:18 PM  For on call review [email or web address].

## 2024-02-19 NOTE — Progress Notes (Signed)
 Mobility Specialist Progress Note:    02/19/24 1410  Mobility  Activity Ambulated independently  Level of Assistance Standby assist, set-up cues, supervision of patient - no hands on  Assistive Device None  Distance Ambulated (ft) 600 ft  Activity Response Tolerated well  Mobility Referral Yes  Mobility visit 1 Mobility  Mobility Specialist Start Time (ACUTE ONLY) 1410  Mobility Specialist Stop Time (ACUTE ONLY) 1430  Mobility Specialist Time Calculation (min) (ACUTE ONLY) 20 min   Pt eager and agreeable to session. No c/o any symptoms. Pt stating they are feeling a lot better and feels as though her breathing is improving. Left pt on EOB w/ all needs met and MD in room.   Venetia Keel Mobility Specialist Please Neurosurgeon or Rehab Office at [phone]

## 2024-02-20 ENCOUNTER — Other Ambulatory Visit (HOSPITAL_COMMUNITY): Payer: Self-pay

## 2024-02-20 DIAGNOSIS — I5042 Chronic combined systolic (congestive) and diastolic (congestive) heart failure: Secondary | ICD-10-CM

## 2024-02-20 DIAGNOSIS — E1169 Type 2 diabetes mellitus with other specified complication: Secondary | ICD-10-CM

## 2024-02-20 DIAGNOSIS — J449 Chronic obstructive pulmonary disease, unspecified: Secondary | ICD-10-CM

## 2024-02-20 DIAGNOSIS — I10 Essential (primary) hypertension: Secondary | ICD-10-CM | POA: Diagnosis not present

## 2024-02-20 DIAGNOSIS — R079 Chest pain, unspecified: Secondary | ICD-10-CM | POA: Diagnosis not present

## 2024-02-20 DIAGNOSIS — N1831 Chronic kidney disease, stage 3a: Secondary | ICD-10-CM

## 2024-02-20 DIAGNOSIS — E785 Hyperlipidemia, unspecified: Secondary | ICD-10-CM

## 2024-02-20 DIAGNOSIS — I4821 Permanent atrial fibrillation: Secondary | ICD-10-CM

## 2024-02-20 LAB — NM MYOCAR MULTI W/SPECT W/WALL MOTION / EF
Estimated workload: 1
Exercise duration (min): 5 min
Exercise duration (sec): 24 s
LV dias vol: 101 mL (ref 46–106)
MPHR: 144 {beats}/min
Nuc Stress EF: 50 %
Peak HR: 90 {beats}/min
Percent HR: 62 %
Rest HR: 72 {beats}/min
Rest Nuclear Isotope Dose: 10 mCi
ST Depression (mm): 0 mm
Stress Nuclear Isotope Dose: 31 mCi
TID: 1.02

## 2024-02-20 LAB — CBC
HCT: 42.1 % (ref 36.0–46.0)
Hemoglobin: 12.6 g/dL (ref 12.0–15.0)
MCH: 22.6 pg — ABNORMAL LOW (ref 26.0–34.0)
MCHC: 29.9 g/dL — ABNORMAL LOW (ref 30.0–36.0)
MCV: 75.6 fL — ABNORMAL LOW (ref 80.0–100.0)
Platelets: 369 K/uL (ref 150–400)
RBC: 5.57 MIL/uL — ABNORMAL HIGH (ref 3.87–5.11)
RDW: 18.6 % — ABNORMAL HIGH (ref 11.5–15.5)
WBC: 10.8 K/uL — ABNORMAL HIGH (ref 4.0–10.5)
nRBC: 0 % (ref 0.0–0.2)

## 2024-02-20 LAB — BASIC METABOLIC PANEL WITH GFR
Anion gap: 9 (ref 5–15)
BUN: 34 mg/dL — ABNORMAL HIGH (ref 8–23)
CO2: 22 mmol/L (ref 22–32)
Calcium: 8.9 mg/dL (ref 8.9–10.3)
Chloride: 102 mmol/L (ref 98–111)
Creatinine, Ser: 1.17 mg/dL — ABNORMAL HIGH (ref 0.44–1.00)
GFR, Estimated: 48 mL/min — ABNORMAL LOW (ref >60)
Glucose, Bld: 170 mg/dL — ABNORMAL HIGH (ref 70–99)
Potassium: 4.4 mmol/L (ref 3.5–5.1)
Sodium: 133 mmol/L — ABNORMAL LOW (ref 135–145)

## 2024-02-20 LAB — GLUCOSE, CAPILLARY
Glucose-Capillary: 103 mg/dL — ABNORMAL HIGH (ref 70–99)
Glucose-Capillary: 92 mg/dL (ref 70–99)

## 2024-02-20 MED ORDER — APIXABAN 5 MG PO TABS
5.0000 mg | ORAL_TABLET | Freq: Two times a day (BID) | ORAL | 0 refills | Status: AC
Start: 2024-02-20 — End: ?
  Filled 2024-02-20: qty 60, 30d supply, fill #0

## 2024-02-20 MED ORDER — METOPROLOL SUCCINATE ER 100 MG PO TB24
100.0000 mg | ORAL_TABLET | Freq: Every day | ORAL | 0 refills | Status: DC
  Filled 2024-02-20: qty 30, 30d supply, fill #0

## 2024-02-20 MED ORDER — EMPAGLIFLOZIN 25 MG PO TABS
25.0000 mg | ORAL_TABLET | Freq: Every day | ORAL | 0 refills | Status: AC
  Filled 2024-02-20: qty 30, 30d supply, fill #0

## 2024-02-20 MED ORDER — DILTIAZEM HCL ER COATED BEADS 240 MG PO CP24
240.0000 mg | ORAL_CAPSULE | Freq: Every day | ORAL | 0 refills | Status: DC
  Filled 2024-02-20: qty 30, 30d supply, fill #0

## 2024-02-20 NOTE — Assessment & Plan Note (Addendum)
 Patient has responded well to AV blockade, she was transitioned to IV diltiazem  to po formulation.  Added metoprolol  succinate.  At the time of her discharge she has no palpitations or chest pain, her heart rate is in the 65 bpm range, atrial fibrillation rhythm.   Plan to continue diltiazem  240 mg CD and metoprolol  succinate. Anticoagulation with apixaban .  Follow up as outpatient.

## 2024-02-20 NOTE — Assessment & Plan Note (Signed)
 Patient had further work up with nuclear stress test, which resulted with LV normal perfusion, no evidence of ischemia, and no evidence of infarction.  LV ventricular function normal.  Low risk study.   She ruled out for acute coronary syndrome.

## 2024-02-20 NOTE — Progress Notes (Signed)
 Nuclear stress test results are still pending.  Radiology was reach by RN yesterday and asked to do the same today as well.   Anjolaoluwa Siguenza Pyote, DO, FACC

## 2024-02-20 NOTE — Assessment & Plan Note (Signed)
 Continue blood pressure control with losartan , metoprolol , and diltiazem .

## 2024-02-20 NOTE — Assessment & Plan Note (Deleted)
 Patient has responded well to AV blockade, she was transitioned to IV diltiazem  to po formulation.  At the time of her discharge she has no palpitations or chest pain, her heart rate is in the 65 bpm range, atrial fibrillation rhythm.   Plan to continue diltiazem 

## 2024-02-20 NOTE — Assessment & Plan Note (Signed)
 Acute exacerbation, patient was placed on bronchodilator therapy and short course of po prednisone .  At the time of her discharge her symptoms have improved.  Continue bronchodilator therapy at home.

## 2024-02-20 NOTE — Assessment & Plan Note (Signed)
 Hyponatremia.   Renal function at the time of discharge with serum cr at 1,17 with K at 4,4 and serum bicarbonate at 22  Na 133  Plan to continue SGLT 2 inh and follow up renal function as outpatient.  Loop diuretic every other day with furosemide  20 mg daily.

## 2024-02-20 NOTE — Assessment & Plan Note (Addendum)
 Patient was placed on insulin  sliding scale for glucose cover and monitoring.  Glucose remained stable.  At the the time of discharge patient will resume metformin .   Continue statin therapy

## 2024-02-20 NOTE — Care Management Important Message (Signed)
 Important Message  Patient Details  Name: Theresa Mendoza MRN: [id number] Date of Birth: 1945-10-01   Important Message Given:  Yes - Medicare IM     Vonzell Arrie Sharps 02/20/2024, 11:23 AM

## 2024-02-20 NOTE — Assessment & Plan Note (Signed)
 LV systolic function 45 to 54% per nuclear study.  Echocardiogram from 09/2023 with RVSP 45.9 mmHg, LA and RA with mild dilatation, mild mitral valve stenosis, moderate aortic valve stenosis.   No signs of exacerbation, plan to continue heart failure guideline medical therapy with metoprolol  succinate, losartan  and SGLT 2 inh Diuresis with furosemide .   Follow up as outpatient.

## 2024-02-20 NOTE — Discharge Summary (Signed)
 Physician Discharge Summary   Patient: Theresa Mendoza MRN: [id number] DOB: [birth year]  Admit date:     02/14/2024  Discharge date: 02/20/24  Discharge Physician: Elidia Sieving Jezreel Justiniano   PCP: Regino Slater, MD   Recommendations at discharge:    Patient has been resumed on metoprolol  succinate 100 mg daily and continue diltiazem  240 mg CD Follow up renal function and electrolytes as outpatient in 7 days Follow up with Dr Regino in 7 to 10 days Follow up with Cardiology as scheduled.   Discharge Diagnoses: Principal Problem:   Atrial fibrillation with RVR (HCC) Active Problems:   Essential hypertension   COPD (chronic obstructive pulmonary disease) (HCC)   Chronic CHF (congestive heart failure) (HCC)   Chest pain   Aortic valve disease   Type 2 diabetes mellitus with hyperglycemia (HCC)   Benign hypertension  Resolved Problems:   * No resolved hospital problems. [facility]: Theresa Mendoza was admitted to the hospital with the working diagnosis of chest pain.   78 yo female with the past medical history of  atrial fibrillation, aortic stenosis, pulmonary hypertension,hypertension, hyperlipidemia, CKD3a, HFpEF, rheumatoid arthritis, anxiety/depression/BPD, COPD, glaucoma, migraine headaches, mild cognitive impairment, type 2 diabetes, and chronic back pain presenting with several days of substernal chest pain, palpitations, dyspnea, and nausea. He was evaluated by his primary care and he was found in atrial fibrillation with RVR, he received diltiazem  25 mg and EMS was called. He received 500 cc NS on route to the ED.  On his initial physical examination in the ED his heart rate 160s, blood pressure 156/121, RR 26 and 02 saturation 99% Lungs with bilateral wheezing, heart with S1 and S2 present, tachycardic, irregularly irregular, systolic murmur at the base, abdomen with no distention and no lower extremity edema.   Na 140, K 4.1 CL 110 bicarbonate 21 glucose 92 bun 19  cr 1,0 BNP 376.4  High sensitive troponin 13 and 12  Wbc 12.9 hgb 12.9 plt 373   Chest radiograph with no cardiomegaly, bilateral hilar vascula congestion, with no infiltrates and no effusions.   EKG 102 bpm, left axis deviation, left anterior fascicular block, right bundle branch block, qtc 489, atrial fibrillation rhythm, no significant ST segment or  T wave changes.   Patient was placed on diltiazem  infusion for rate control.   Patient responded well to IV AV blockade and was transitioned to po diltiazem  with good toleration.  Further work up with stress test, which was low risk.   Patient will follow up as outpatient.   Assessment and Plan: * Permanent atrial fibrillation [facility]) Patient has responded well to AV blockade, she was transitioned to IV diltiazem  to po formulation.  Added metoprolol  succinate.  At the time of her discharge she has no palpitations or chest pain, her heart rate is in the 65 bpm range, atrial fibrillation rhythm.   Plan to continue diltiazem  240 mg CD and metoprolol  succinate. Anticoagulation with apixaban .  Follow up as outpatient.   Chest pain Patient had further work up with nuclear stress test, which resulted with LV normal perfusion, no evidence of ischemia, and no evidence of infarction.  LV ventricular function normal.  Low risk study.   She ruled out for acute coronary syndrome.    Essential hypertension Continue blood pressure control with losartan , metoprolol , and diltiazem .   Chronic CHF (congestive heart failure) (HCC) LV systolic function 45 to 54% per nuclear study.  Echocardiogram from 09/2023 with RVSP 45.9 mmHg, LA and RA with  mild dilatation, mild mitral valve stenosis, moderate aortic valve stenosis.   No signs of exacerbation, plan to continue heart failure guideline medical therapy with metoprolol  succinate, losartan  and SGLT 2 inh Diuresis with furosemide .   Follow up as outpatient.   CKD stage 3a, GFR 45-59 ml/min  (HCC) Hyponatremia.   Renal function at the time of discharge with serum cr at 1,17 with K at 4,4 and serum bicarbonate at 22  Na 133  Plan to continue SGLT 2 inh and follow up renal function as outpatient.  Loop diuretic every other day with furosemide  20 mg daily.   Type 2 diabetes mellitus with hyperlipidemia (HCC) Patient was placed on insulin  sliding scale for glucose cover and monitoring.  Glucose remained stable.  At the the time of discharge patient will resume metformin .   Continue statin therapy   COPD (chronic obstructive pulmonary disease) (HCC) Acute exacerbation, patient was placed on bronchodilator therapy and short course of po prednisone .  At the time of her discharge her symptoms have improved.  Continue bronchodilator therapy at home.       Consultants: Cardiology  Procedures performed: nuclear stress test   Disposition: Home Diet recommendation:  Cardiac and Carb modified diet DISCHARGE MEDICATION: Allergies as of 02/20/2024       Reactions   Lithobid [lithium] Other (See Comments)   Suicidal ideation   Tegretol [carbamazepine] Other (See Comments)   Melted her skin        Medication List     STOP taking these medications    amoxicillin -clavulanate 875-125 MG tablet Commonly known as: AUGMENTIN    HYDROcodone -acetaminophen  10-325 MG tablet Commonly known as: NORCO   umeclidinium-vilanterol 62.5-25 MCG/ACT Aepb Commonly known as: ANORO ELLIPTA        TAKE these medications    albuterol  108 (90 Base) MCG/ACT inhaler Commonly known as: VENTOLIN  HFA Inhale 2 puffs into the lungs every 4 (four) hours as needed for wheezing or shortness of breath.   apixaban  5 MG Tabs tablet Commonly known as: Eliquis  Take 1 tablet (5 mg total) by mouth 2 (two) times daily. NEEDS CARDIOLOGY APPT FOR ELIQUIS  REFILLS, PLEASE CALL OFFICE   diltiazem  240 MG 24 hr capsule Commonly known as: CARDIZEM  CD Take 1 capsule (240 mg total) by mouth daily.    DULoxetine  60 MG capsule Commonly known as: CYMBALTA  TAKE 2 CAPSULES BY MOUTH EVERY DAY   empagliflozin  25 MG Tabs tablet Commonly known as: Jardiance  Take 1 tablet (25 mg total) by mouth daily.   furosemide  20 MG tablet Commonly known as: LASIX  Take 20 mg by mouth every other day.   losartan  25 MG tablet Commonly known as: COZAAR  TAKE 1 TABLET (25 MG TOTAL) BY MOUTH DAILY.   metFORMIN  500 MG tablet Commonly known as: Glucophage  Take 1 tablet (500 mg total) by mouth 2 (two) times daily with a meal. What changed: when to take this   metoprolol  succinate 100 MG 24 hr tablet Commonly known as: TOPROL -XL Take 1 tablet (100 mg total) by mouth daily. Take with or immediately following a meal. Start taking on: February 21, 2024   nitroGLYCERIN  0.4 MG SL tablet Commonly known as: NITROSTAT  Place 1 tablet (0.4 mg total) under the tongue every 5 (five) minutes as needed for chest pain.   omeprazole 40 MG capsule Commonly known as: PRILOSEC Take 40 mg by mouth daily as needed (heartburn).   pravastatin  40 MG tablet Commonly known as: PRAVACHOL  Take 1 tablet by mouth daily.  Discharge Exam: Filed Weights   02/18/24 0444 02/19/24 0438 02/20/24 0104  Weight: 69.9 kg 69.3 kg 69.3 kg   BP (!) 140/72 (BP Location: Right Arm)   Pulse 87   Temp 97.8 F (36.6 C) (Oral)   Resp 15   Ht 5' 5 (1.651 m)   Wt 69.3 kg   SpO2 97%   BMI 25.42 kg/m   Patient is feeling better, no chest pain and no dyspnea, no palpitations  Neurology awake and alert ENT with no pallor or icterus Cardiovascular with S1 and S2 present, irregularly irregular with no gallops or rubs, positive systolic murmur at base No JVD No lower extremity edema Respiratory with no rales or wheezing, no rhonchi  Abdomen with no distention    Condition at discharge: stable  The results of significant diagnostics from this hospitalization (including imaging, microbiology, ancillary and laboratory) are listed  below for reference.   Imaging Studies: NM Myocar Multi W/Spect W/Wall Motion / EF Result Date: 02/20/2024   LV perfusion is normal. There is no evidence of ischemia. There is no evidence of infarction.   Left ventricular function is normal. Nuclear stress EF: 50%. The left ventricular ejection fraction is mildly decreased (45-54%). End diastolic cavity size is normal.   The study is normal. The study is low risk.   DG Chest Port 1 View Result Date: 02/14/2024 CLINICAL DATA:  Chest pain. EXAM: PORTABLE CHEST 1 VIEW COMPARISON:  September 22, 2023. FINDINGS: The heart size and mediastinal contours are within normal limits. Both lungs are clear. The visualized skeletal structures are unremarkable. IMPRESSION: No active disease. Electronically Signed   By: Lynwood Landy Raddle M.D.   On: 02/14/2024 17:15    Microbiology: Results for orders placed or performed during the hospital encounter of 10/13/21  Culture, blood (Routine X 2) w Reflex to ID Panel     Status: None   Collection Time: 10/13/21  1:35 PM   Specimen: BLOOD  Result Value Ref Range Status   Specimen Description   Final    BLOOD BLOOD RIGHT HAND Performed at [facility], [address], [place], [place] [address]    Special Requests   Final    Blood Culture adequate volume BOTTLES DRAWN AEROBIC AND ANAEROBIC Performed at [facility], [address], [place], [place] [address]    Culture   Final    NO GROWTH 5 DAYS Performed at [facility], [address] [place], [place] [address]    Report Status 10/18/2021 FINAL  Final  Culture, blood (Routine X 2) w Reflex to ID Panel     Status: Abnormal   Collection Time: 10/13/21  1:42 PM   Specimen: BLOOD  Result Value Ref Range Status   Specimen Description   Final    BLOOD BLOOD LEFT HAND Performed at [facility], [address] [place], [place] [address]    Special Requests   Final    Blood Culture adequate volume  BOTTLES DRAWN AEROBIC AND ANAEROBIC Performed at [facility], [address] [place], [place] [address]    Culture  Setup Time   Final    GRAM POSITIVE RODS ANAEROBIC BOTTLE ONLY CRITICAL RESULT CALLED TO, READ BACK BY AND VERIFIED WITH: DR MIGNON BUMP ON [id number] AT 0820 BY E.PARRISH    Culture (A)  Final    PROPIONIBACTERIUM ACNES Standardized susceptibility testing for this organism is not available. Performed at [facility], [address] [place],  [place] [address]    Report Status 10/19/2021 FINAL  Final  Resp Panel by RT-PCR (Flu A&B, Covid) Nasopharyngeal Swab     Status: None   Collection Time: 10/13/21  2:25 PM   Specimen: Nasopharyngeal Swab; Nasopharyngeal(NP) swabs in vial transport medium  Result Value Ref Range Status   SARS Coronavirus 2 by RT PCR NEGATIVE NEGATIVE Final    Comment: (NOTE) SARS-CoV-2 target nucleic acids are NOT DETECTED.  The SARS-CoV-2 RNA is generally detectable in upper respiratory specimens during the acute phase of infection. The lowest concentration of SARS-CoV-2 viral copies this assay can detect is 138 copies/mL. A negative result does not preclude SARS-Cov-2 infection and should not be used as the sole basis for treatment or other patient management decisions. A negative result may occur with  improper specimen collection/handling, submission of specimen other than nasopharyngeal swab, presence of viral mutation(s) within the areas targeted by this assay, and inadequate number of viral copies(<138 copies/mL). A negative result must be combined with clinical observations, patient history, and epidemiological information. The expected result is Negative.  Fact Sheet for Patients:  BloggerCourse.com  Fact Sheet for Healthcare Providers:  SeriousBroker.it  This test is no t yet approved or cleared by the [place]  FDA and  has been authorized for detection  and/or diagnosis of SARS-CoV-2 by FDA under an Emergency Use Authorization (EUA). This EUA will remain  in effect (meaning this test can be used) for the duration of the COVID-19 declaration under Section 564(b)(1) of the Act, 21 U.S.C.section 360bbb-3(b)(1), unless the authorization is terminated  or revoked sooner.       Influenza A by PCR NEGATIVE NEGATIVE Final   Influenza B by PCR NEGATIVE NEGATIVE Final    Comment: (NOTE) The Xpert Xpress SARS-CoV-2/FLU/RSV plus assay is intended as an aid in the diagnosis of influenza from Nasopharyngeal swab specimens and should not be used as a sole basis for treatment. Nasal washings and aspirates are unacceptable for Xpert Xpress SARS-CoV-2/FLU/RSV testing.  Fact Sheet for Patients: BloggerCourse.com  Fact Sheet for Healthcare Providers: SeriousBroker.it  This test is not yet approved or cleared by the [place]  FDA and has been authorized for detection and/or diagnosis of SARS-CoV-2 by FDA under an Emergency Use Authorization (EUA). This EUA will remain in effect (meaning this test can be used) for the duration of the COVID-19 declaration under Section 564(b)(1) of the Act, 21 U.S.C. section 360bbb-3(b)(1), unless the authorization is terminated or revoked.  Performed at [facility], [address], [place], [place] [address]     Labs: CBC: Recent Labs  Lab 02/16/24 0320 02/17/24 0251 02/18/24 0309 02/19/24 0311 02/20/24 0229  WBC 12.3* 9.0 13.9* 14.0* 10.8*  HGB 11.6* 12.6 12.0 12.1 12.6  HCT 38.5 42.0 40.6 40.9 42.1  MCV 75.3* 75.1* 76.3* 76.0* 75.6*  PLT 326 342 338 378 369   Basic Metabolic Panel: Recent Labs  Lab 02/16/24 0320 02/17/24 0251 02/18/24 0309 02/19/24 0311 02/20/24 0229  NA 137 135 134* 140 133*  K 5.0 4.6 4.1 4.8 4.4  CL 104 105 102 107 102  CO2 26 18* 22 25 22   GLUCOSE 100* 161* 176* 95 170*  BUN 23 24* 35* 34* 34*   CREATININE 1.03* 1.13* 1.28* 1.09* 1.17*  CALCIUM  8.9 9.3 8.8* 9.2 8.9  MG  --   --  2.2  --   --    Liver Function Tests: Recent Labs  Lab 02/15/24 0033  AST 16  ALT 14  ALKPHOS  76  BILITOT 0.7  PROT 6.4*  ALBUMIN 3.4*   CBG: Recent Labs  Lab 02/19/24 0605 02/19/24 1240 02/19/24 1559 02/19/24 2214 02/20/24 0556  GLUCAP 103* 116* 98 197* 103*    Discharge time spent: greater than 30 minutes.  Signed: Elidia Toribio Furnace, MD [facility] 02/20/2024

## 2024-02-20 NOTE — Progress Notes (Signed)
 Mobility Specialist Progress Note:    02/20/24 1049  Mobility  Activity Ambulated independently;Stood at bedside  Level of Assistance Independent  Assistive Device None  Distance Ambulated (ft) 25 ft  Activity Response Tolerated well  Mobility Referral Yes  Mobility visit 1 Mobility  Mobility Specialist Start Time (ACUTE ONLY) 1049  Mobility Specialist Stop Time (ACUTE ONLY) 1054  Mobility Specialist Time Calculation (min) (ACUTE ONLY) 5 min   Pt being d/c but agreeable to in room ambulation. No c/o any symptoms. Pt in good spirits and happy to be going home. Left pt on EOB w/ all needs met.   Venetia Keel Mobility Specialist Please Neurosurgeon or Rehab Office at [phone]

## 2024-02-26 DIAGNOSIS — R110 Nausea: Secondary | ICD-10-CM | POA: Diagnosis not present

## 2024-02-26 DIAGNOSIS — J449 Chronic obstructive pulmonary disease, unspecified: Secondary | ICD-10-CM | POA: Diagnosis not present

## 2024-02-26 DIAGNOSIS — N1831 Chronic kidney disease, stage 3a: Secondary | ICD-10-CM | POA: Diagnosis not present

## 2024-02-26 DIAGNOSIS — I5042 Chronic combined systolic (congestive) and diastolic (congestive) heart failure: Secondary | ICD-10-CM | POA: Diagnosis not present

## 2024-02-26 DIAGNOSIS — I4891 Unspecified atrial fibrillation: Secondary | ICD-10-CM | POA: Diagnosis not present

## 2024-02-26 DIAGNOSIS — F17210 Nicotine dependence, cigarettes, uncomplicated: Secondary | ICD-10-CM | POA: Diagnosis not present

## 2024-03-25 ENCOUNTER — Ambulatory Visit: Admitting: Physician Assistant

## 2024-03-26 ENCOUNTER — Encounter: Payer: Self-pay | Admitting: Physician Assistant

## 2024-03-26 ENCOUNTER — Ambulatory Visit: Attending: Physician Assistant | Admitting: Physician Assistant

## 2024-03-26 VITALS — BP 130/72 | HR 131 | Ht 65.0 in | Wt 159.0 lb

## 2024-03-26 DIAGNOSIS — I4891 Unspecified atrial fibrillation: Secondary | ICD-10-CM

## 2024-03-26 DIAGNOSIS — I5032 Chronic diastolic (congestive) heart failure: Secondary | ICD-10-CM

## 2024-03-26 DIAGNOSIS — I4821 Permanent atrial fibrillation: Secondary | ICD-10-CM | POA: Diagnosis not present

## 2024-03-26 DIAGNOSIS — J449 Chronic obstructive pulmonary disease, unspecified: Secondary | ICD-10-CM

## 2024-03-26 DIAGNOSIS — I2510 Atherosclerotic heart disease of native coronary artery without angina pectoris: Secondary | ICD-10-CM | POA: Diagnosis not present

## 2024-03-26 DIAGNOSIS — I350 Nonrheumatic aortic (valve) stenosis: Secondary | ICD-10-CM | POA: Diagnosis not present

## 2024-03-26 MED ORDER — DILTIAZEM HCL ER COATED BEADS 120 MG PO CP24: 120.0000 mg | ORAL_CAPSULE | Freq: Every evening | ORAL | 3 refills | Status: DC

## 2024-03-26 NOTE — Assessment & Plan Note (Addendum)
 Rate control therapy.  She could not tolerate digoxin  in the past secondary to bradycardia.  Recent admission with A-fib with RVR in the setting of COPD exacerbation.  Her heart rate is uncontrolled today.  She is mildly symptomatic with this.  She would not likely be able to tolerate rapid heart rates for much longer.  Hopefully, we can avoid increasing her beta-blocker further.  However, I suspect I will eventually have to do that.  I will start by increasing her diltiazem  dose.  If her heart rate does not improve, her only option may be AV nodal ablation and pacemaker implantation.  Currently, she is not interested in this. -Increase diltiazem  to 240 mg in the morning, 120 mg in the evening -Continue metoprolol  succinate 100 mg daily -Continue Eliquis  5 mg twice daily -Follow-up 1 week -Increase metoprolol  succinate to 100 mg twice daily at follow-up if heart rate still >100 -Will review with Dr. Shlomo +/- referral to EP

## 2024-03-26 NOTE — Assessment & Plan Note (Addendum)
 EF was 40-45 in March 2022.  This improved to 60-65 by echocardiogram March 2025.  Volume status seems to be stable.  BUN: Creatinine ratio was elevated in the hospital.  In the setting of empagliflozin  and furosemide , she may be somewhat dehydrated. -Obtain BMET today -Continue furosemide  20 mg every other day -If BUN elevated, decrease furosemide  to as needed dosing -Continue Jardiance  25 mg (diabetes) daily, losartan  25 mg daily, metoprolol  succinate 100 mg daily

## 2024-03-26 NOTE — Assessment & Plan Note (Addendum)
 Remote history of myocardial infarction.  She was recently manage chest pain in setting of COPD exacerbation and atrial fibrillation with RVR.  Cardiac enzymes remain negative.  Inpatient nuclear stress test was low risk.  She has some chest discomfort with lying down but no exertional chest discomfort.  She is not on antiplatelet therapy as she is on Eliquis .  Continue pravastatin  40 mg daily, nitroglycerin  as needed, metoprolol  succinate 100 mg daily

## 2024-03-26 NOTE — Assessment & Plan Note (Signed)
 She has a history of moderate pulmonary hypertension, likely related to COPD.  She continues to smoke.  We discussed the importance of quitting.  I suspect her heart rate will be difficult to control given her underlying lung disease.

## 2024-03-26 NOTE — Progress Notes (Signed)
 OFFICE NOTE:    Date:  03/26/2024  ID:  Theresa Mendoza, DOB 04-Mar-1946, MRN [id number] PCP: Regino Slater, MD  [facility] Providers Cardiologist:  Wilbert Bihari, MD        Remote hx of MI (Records from [facility]: no CAD by cath in 2016) MPI 02/01/16: no ischemia, EF 65 MPI 02/19/2024: No ischemia or infarction, EF 50, low risk Permanent atrial fibrillation  Dig stopped 10/2023 due to bradycardia  HFmrEF (heart failure with mildly reduced ejection fraction)  Mild mitral regurgitation Mild to mod aortic stenosis  TTE 10/03/2020: EF 40-45, mild MR, mild-moderate aortic stenosis (mean gradient 14.5, V-max 246 cm/s, DI 0.44) TTE 09/24/23: EF 60-65, no RWMA, NL RVSF, mod pulmonary HTN, RVSP 45.9, mild BAE, no MR, mild MS (mean MV gradient 6 mmHg), severe AV Ca2+, mild AI, mod AS (mean AV gradient 33 mmHg, Vmax 357.6 cm/s, DI 0.48) Right Bundle Branch Block Hyperlipidemia  Chronic Obstructive Pulmonary Disease Pulmonary hypertension  LHC 09/2014 ([place]): PASP 44 mmHg TTE 09/2023: RVSP 45.9 Rheumatoid arthritis       Discussed the use of AI scribe software for clinical note transcription with the patient, who gave verbal consent to proceed. History of Present Illness Theresa Mendoza is a 78 y.o. female who returns for post hospital follow up. She was last seen in 11/2023.  She was admitted 7/31-8/6 with chest pain in the setting of COPD exacerbation, complicated by atrial fibrillation with rapid rate.  She was seen by cardiology.  Cardiac enzymes were negative.  Inpatient nuclear stress test was low risk.  It was felt that her elevated rate was likely related to her COPD exacerbation.  Home dose of diltiazem  and metoprolol  succinate were continued.  She reports that she does not always feel when her heart rate is fast. She experiences chest discomfort when lying down and feels clumsy when walking. No significant weight gain, and her weight is stable around 155-160 pounds. She experiences  lightheadedness upon standing but has never passed out. She smokes a pack of cigarettes a day, which is an improvement from three packs a day when she was working. She reports difficulty sleeping at night, feeling that her mind is too busy.     ROS-See HPI    Studies Reviewed:  EKG Interpretation Date/Time:  Wednesday March 26 2024 13:11:45 EDT Ventricular Rate:  131 PR Interval:    QRS Duration:  134 QT Interval:  312 QTC Calculation: 460 R Axis:   -82  Text Interpretation: Atrial fibrillation with rapid ventricular response Right bundle branch block Left anterior fascicular block Bifascicular block Minimal voltage criteria for LVH, may be normal variant No significant change since last tracing except HR higher Confirmed by Lelon Hamilton [phone]) on 03/26/2024 1:34:17 PM    Labs-chart reviewed 02/20/2024: Na 133, K 4.4, BUN 34, creatinine 1.17, Hgb 12.6 02/15/2024: BNP 376.4 11/23/2023: TSH 1.27  Risk Assessment/Calculations: CHA2DS2-VASc Score = 6   This indicates a 9.7% annual risk of stroke. The patient's score is based upon: CHF History: 1 HTN History: 1 Diabetes History: 0 Stroke History: 0 Vascular Disease History: 1 Age Score: 2 Gender Score: 1           Physical Exam:  VS:  BP 130/72 (BP Location: Left Arm, Patient Position: Sitting, Cuff Size: Normal)   Pulse (!) 131   Ht 5' 5 (1.651 m)   Wt 159 lb (72.1 kg)   SpO2 95%   BMI 26.46 kg/m  Wt Readings from Last 3 Encounters:  03/26/24 159 lb (72.1 kg)  02/20/24 152 lb 12.5 oz (69.3 kg)  11/23/23 153 lb (69.4 kg)    Constitutional:      Appearance: Healthy appearance. Not in distress.  Neck:     Vascular: No JVR. JVD normal.  Pulmonary:     Breath sounds: Normal breath sounds. No wheezing. No rales.  Cardiovascular:     Tachycardia present. Irregularly irregular rhythm.  Edema:    Feet: bilateral trace edema of the feet. Abdominal:     Palpations: Abdomen is soft.       Assessment and Plan:     Assessment & Plan Atrial fibrillation with rapid ventricular response (HCC) Rate control therapy.  She could not tolerate digoxin  in the past secondary to bradycardia.  Recent admission with A-fib with RVR in the setting of COPD exacerbation.  Her heart rate is uncontrolled today.  She is mildly symptomatic with this.  She would not likely be able to tolerate rapid heart rates for much longer.  Hopefully, we can avoid increasing her beta-blocker further.  However, I suspect I will eventually have to do that.  I will start by increasing her diltiazem  dose.  If her heart rate does not improve, her only option may be AV nodal ablation and pacemaker implantation.  Currently, she is not interested in this. -Increase diltiazem  to 240 mg in the morning, 120 mg in the evening -Continue metoprolol  succinate 100 mg daily -Continue Eliquis  5 mg twice daily -Follow-up 1 week -Increase metoprolol  succinate to 100 mg twice daily at follow-up if heart rate still >100 -Will review with Dr. Shlomo +/- referral to EP Heart failure with improved ejection fraction (HFimpEF) (HCC) EF was 40-45 in March 2022.  This improved to 60-65 by echocardiogram March 2025.  Volume status seems to be stable.  BUN: Creatinine ratio was elevated in the hospital.  In the setting of empagliflozin  and furosemide , she may be somewhat dehydrated. -Obtain BMET today -Continue furosemide  20 mg every other day -If BUN elevated, decrease furosemide  to as needed dosing -Continue Jardiance  25 mg (diabetes) daily, losartan  25 mg daily, metoprolol  succinate 100 mg daily Coronary artery disease involving native coronary artery of native heart without angina pectoris Remote history of myocardial infarction.  She was recently manage chest pain in setting of COPD exacerbation and atrial fibrillation with RVR.  Cardiac enzymes remain negative.  Inpatient nuclear stress test was low risk.  She has some chest discomfort with lying down but no  exertional chest discomfort.  She is not on antiplatelet therapy as she is on Eliquis .  Continue pravastatin  40 mg daily, nitroglycerin  as needed, metoprolol  succinate 100 mg daily Nonrheumatic aortic valve stenosis Moderate aortic stenosis by echocardiogram March 2025 with mean gradient 33 mmHg V-max 357.6 cm/s. -Continue annual surveillance Chronic obstructive pulmonary disease, unspecified COPD type (HCC) She has a history of moderate pulmonary hypertension, likely related to COPD.  She continues to smoke.  We discussed the importance of quitting.  I suspect her heart rate will be difficult to control given her underlying lung disease.         Dispo:  Return in about 1 week (around 04/02/2024) for Close Follow Up, w/ Glendia Ferrier, PA-C.  Signed, Glendia Ferrier, PA-C

## 2024-03-26 NOTE — Patient Instructions (Signed)
 Medication Instructions:  Your physician has recommended you make the following change in your medication:   INCREASE the Cardizem  to 240 mg in the morning and 120 mg in the evening  *If you need a refill on your cardiac medications before your next appointment, please call your pharmacy*  Lab Work: TODAY:  BMET  If you have labs (blood work) drawn today and your tests are completely normal, you will receive your results only by: MyChart Message (if you have MyChart) OR A paper copy in the mail If you have any lab test that is abnormal or we need to change your treatment, we will call you to review the results.  Testing/Procedures: None ordered  Follow-Up: At [facility], you and your health needs are our priority.  As part of our continuing mission to provide you with exceptional heart care, our providers are all part of one team.  This team includes your primary Cardiologist (physician) and Advanced Practice Providers or APPs (Physician Assistants and Nurse Practitioners) who all work together to provide you with the care you need, when you need it.  Your next appointment:   1 week(s)  Provider:   Glendia Ferrier, PA-C          We recommend signing up for the patient portal called MyChart.  Sign up information is provided on this After Visit Summary.  MyChart is used to connect with patients for Virtual Visits (Telemedicine).  Patients are able to view lab/test results, encounter notes, upcoming appointments, etc.  Non-urgent messages can be sent to your provider as well.   To learn more about what you can do with MyChart, go to [email or web address].   Other Instructions

## 2024-03-26 NOTE — Assessment & Plan Note (Addendum)
 Moderate aortic stenosis by echocardiogram March 2025 with mean gradient 33 mmHg V-max 357.6 cm/s. -Continue annual surveillance

## 2024-03-27 ENCOUNTER — Ambulatory Visit: Payer: Self-pay | Admitting: Physician Assistant

## 2024-03-27 LAB — BASIC METABOLIC PANEL WITH GFR
BUN/Creatinine Ratio: 25 (ref 12–28)
BUN: 25 mg/dL (ref 8–27)
CO2: 20 mmol/L (ref 20–29)
Calcium: 9.6 mg/dL (ref 8.7–10.3)
Chloride: 101 mmol/L (ref 96–106)
Creatinine, Ser: 1.02 mg/dL — ABNORMAL HIGH (ref 0.57–1.00)
Glucose: 136 mg/dL — ABNORMAL HIGH (ref 70–99)
Potassium: 4.4 mmol/L (ref 3.5–5.2)
Sodium: 140 mmol/L (ref 134–144)
eGFR: 57 mL/min/1.73 — ABNORMAL LOW (ref >59)

## 2024-03-27 NOTE — Addendum Note (Signed)
 Addended by: MEMORY DELON POUR on: 03/27/2024 09:29 AM   Modules accepted: Orders

## 2024-03-31 ENCOUNTER — Encounter: Payer: Self-pay | Admitting: *Deleted

## 2024-03-31 NOTE — Progress Notes (Unsigned)
 OFFICE NOTE:    Date:  04/02/2024  ID:  Rock ORN Shock, DOB 11-Oct-1945, MRN [id number] PCP: Regino Slater, MD  [facility] Providers Cardiologist:  Wilbert Bihari, MD        Remote hx of MI (Records from [facility]: no CAD by cath in 2016) MPI 02/01/16: no ischemia, EF 65 MPI 02/19/2024: No ischemia or infarction, EF 50, low risk Permanent atrial fibrillation  Dig stopped 10/2023 due to bradycardia  HFmrEF (heart failure with mildly reduced ejection fraction)  Mild mitral regurgitation Mild to mod aortic stenosis  TTE 10/03/2020: EF 40-45, mild MR, mild-moderate aortic stenosis (mean gradient 14.5, V-max 246 cm/s, DI 0.44) TTE 09/24/23: EF 60-65, no RWMA, NL RVSF, mod pulmonary HTN, RVSP 45.9, mild BAE, no MR, mild MS (mean MV gradient 6 mmHg), severe AV Ca2+, mild AI, mod AS (mean AV gradient 33 mmHg, Vmax 357.6 cm/s, DI 0.48) Right Bundle Branch Block Hyperlipidemia  Chronic Obstructive Pulmonary Disease Pulmonary hypertension  [place] 09[place] ([place] PASP 44 mmHg TTE 09/2023: RVSP 45.9 Rheumatoid arthritis       Discussed the use of AI scribe software for clinical note transcription with the patient, who gave verbal consent to proceed. History of Present Illness Theresa Mendoza is a 78 y.o. female who returns for follow up of AFib. She was seen 03/26/2024 for posthospitalization follow-up.  She had been admitted with chest pain in the setting of COPD exacerbation complicated by atrial fibrillation with rapid rate.  When seen last week, her heart rate was uncontrolled.  I increased her diltiazem  to 240 mg in the morning and 120 mg in the evening and brought her back for close follow-up.  She presents today with nausea, worsening shortness of breath and chest discomfort. She is accompanied by her daughter-in-law. She experiences nausea and chest tightness that began last night. The chest sensation is described as 'tightness' rather than pain, and nausea occurs with heart incidents. No  vomiting is present, but she feels 'really nauseated' and 'uncomfortable' with chest tightness since last night. She reports increased shortness of breath but can lay flat without difficulty. No recent passing out episodes are noted. She has increased coughing with some production, though she does not spit it out. No bleeding, black stools, or bloody urine are present. She continues to smoke, though she has reduced her intake to about one pack a day.    ROS-See HPI    Studies Reviewed:  EKG Interpretation Date/Time:  Wednesday April 02 2024 09:40:17 EDT Ventricular Rate:  131 PR Interval:    QRS Duration:  140 QT Interval:  328 QTC Calculation: 484 R Axis:   -76  Text Interpretation: Atrial fibrillation with rapid ventricular response Right bundle branch block Left anterior fascicular block Bifascicular block Minimal voltage criteria for LVH, may be normal variant No significant change since last tracing Confirmed by Lelon Hamilton [phone]) on 04/02/2024 9:43:39 AM   Recent Labs    03/26/24 1420  EGFR 57*  CREATININE 1.02*  K 4.4    Risk Assessment/Calculations: CHA2DS2-VASc Score = 6   This indicates a 9.7% annual risk of stroke. The patient's score is based upon: CHF History: 1 HTN History: 1 Diabetes History: 0 Stroke History: 0 Vascular Disease History: 1 Age Score: 2 Gender Score: 1           Physical Exam:  VS:  BP 108/74 (BP Location: Left Arm, Patient Position: Sitting, Cuff Size: Normal)   Pulse (!) 131   Resp 16  Ht 5' 5 (1.651 m)   Wt 160 lb 12.8 oz (72.9 kg)   SpO2 94%   BMI 26.76 kg/m        Wt Readings from Last 3 Encounters:  04/02/24 160 lb 12.8 oz (72.9 kg)  03/26/24 159 lb (72.1 kg)  02/20/24 152 lb 12.5 oz (69.3 kg)    Constitutional:      Appearance: Not in distress. Chronically ill-appearing.  Neck:     Vascular: JVD normal.  Pulmonary:     Breath sounds: Diffuse Wheezing present. No rales.  Cardiovascular:     Tachycardia present.  Irregularly irregular rhythm.  Edema:    Peripheral edema absent.  Abdominal:     Palpations: Abdomen is soft.     Tenderness: There is abdominal tenderness (diffuse).  Skin:    General: Skin is warm and dry.        Assessment and Plan:    Assessment & Plan Atrial fibrillation with rapid ventricular response (HCC) She has a history of permanent atrial fibrillation.  She was previously on digoxin  for many years with controlled rate.  In April of this year, she had significant bradycardia and was symptomatic.  We stopped her digoxin  with improved heart rate and resolution of symptoms.  She was admitted to the hospital recently with COPD exacerbation and atrial fibrillation with rapid rate.  I saw her in follow-up last week.  She continued to have elevated heart rates.  I adjusted her diltiazem .  Her heart rates are still elevated.  She is symptomatic.  She notes worsening shortness of breath wheezing and chest discomfort.  She is also nauseated.  Her family is not certain that they can get her back to the car without assistance.  Given her severe lung disease and moderate aortic stenosis in the setting of atrial fibrillation with rapid rate that is not responding to oral rate control therapy, I think she needs admission to the hospital for further evaluation and management.  COPD exacerbation may be the driving force behind all of this.  I reviewed her case with Dr. Wonda (attending MD) who agreed.  He is not a candidate for amiodarone  for rate control given her lung disease. -Transport to [facility] via EMS -Continue Eliquis  5 mg twice daily -Recommend she be admitted by internal medicine service and cardiology will consult -She should be seen by EP.  She may ultimately need AV nodal ablation with pacemaker implant Heart failure with improved ejection fraction (HFimpEF) (HCC) EF was 40-45 in March 2022.  This improved to 60-65 by echocardiogram March 2025.  Volume appears to be stable on  exam.  She is more short of breath likely related to COPD exacerbation and atrial fibrillation with rapid rate. Coronary artery disease involving native coronary artery of native heart without angina pectoris Remote history of myocardial infarction.  Recent nuclear stress test during admission in 02/2024 was neg for ischemia.  She notes chest tightness.  This is likely related to COPD exacerbation accompanied by atrial fibrillation with rapid rate.  As noted she will be transported to the emergency room for further evaluation management. Nonrheumatic aortic valve stenosis Moderate aortic stenosis by echocardiogram March 2025 with mean gradient 33 mmHg V-max 357.6 cm/s. Chronic obstructive pulmonary disease, unspecified COPD type (HCC) She continues to smoke.  Given her shortness of breath and wheezing, she may be experiencing a COPD exacerbation.  As noted, she will be transported to the emergency room for admission by internal medicine.  Dispo:  Return for [facility] Follow Up.  Signed, Glendia Ferrier, PA-C

## 2024-03-31 NOTE — Assessment & Plan Note (Signed)
 Moderate aortic stenosis by echocardiogram March 2025 with mean gradient 33 mmHg V-max 357.6 cm/s.***

## 2024-03-31 NOTE — Assessment & Plan Note (Signed)
 EF was 40-45 in March 2022.  This improved to 60-65 by echocardiogram March 2025.  Volume appears to be stable on exam.  She is more short of breath likely related to COPD exacerbation and atrial fibrillation with rapid rate.

## 2024-03-31 NOTE — Assessment & Plan Note (Signed)
 Remote history of myocardial infarction.  Recent nuclear stress test during admission in 02/2024 was neg for ischemia. ***

## 2024-04-02 ENCOUNTER — Inpatient Hospital Stay (HOSPITAL_COMMUNITY)
Admission: EM | Admit: 2024-04-02 | Discharge: 2024-04-05 | DRG: 309 | Disposition: A | Discharge status: Home Health | Source: Ambulatory Visit | Attending: Internal Medicine | Admitting: Internal Medicine

## 2024-04-02 ENCOUNTER — Ambulatory Visit: Attending: Physician Assistant | Admitting: Physician Assistant

## 2024-04-02 ENCOUNTER — Other Ambulatory Visit: Payer: Self-pay

## 2024-04-02 ENCOUNTER — Encounter (HOSPITAL_COMMUNITY): Payer: Self-pay | Admitting: Emergency Medicine

## 2024-04-02 ENCOUNTER — Emergency Department (HOSPITAL_COMMUNITY)

## 2024-04-02 ENCOUNTER — Encounter: Payer: Self-pay | Admitting: Physician Assistant

## 2024-04-02 VITALS — BP 108/74 | HR 131 | Resp 16 | Ht 65.0 in | Wt 160.8 lb

## 2024-04-02 DIAGNOSIS — N179 Acute kidney failure, unspecified: Secondary | ICD-10-CM | POA: Diagnosis not present

## 2024-04-02 DIAGNOSIS — Z888 Allergy status to other drugs, medicaments and biological substances status: Secondary | ICD-10-CM

## 2024-04-02 DIAGNOSIS — R0789 Other chest pain: Secondary | ICD-10-CM | POA: Diagnosis not present

## 2024-04-02 DIAGNOSIS — F319 Bipolar disorder, unspecified: Secondary | ICD-10-CM | POA: Diagnosis present

## 2024-04-02 DIAGNOSIS — I4891 Unspecified atrial fibrillation: Secondary | ICD-10-CM | POA: Diagnosis present

## 2024-04-02 DIAGNOSIS — Z801 Family history of malignant neoplasm of trachea, bronchus and lung: Secondary | ICD-10-CM

## 2024-04-02 DIAGNOSIS — E785 Hyperlipidemia, unspecified: Secondary | ICD-10-CM | POA: Diagnosis present

## 2024-04-02 DIAGNOSIS — Z604 Social exclusion and rejection: Secondary | ICD-10-CM | POA: Diagnosis present

## 2024-04-02 DIAGNOSIS — Z79899 Other long term (current) drug therapy: Secondary | ICD-10-CM

## 2024-04-02 DIAGNOSIS — I452 Bifascicular block: Secondary | ICD-10-CM | POA: Diagnosis present

## 2024-04-02 DIAGNOSIS — Z90710 Acquired absence of both cervix and uterus: Secondary | ICD-10-CM

## 2024-04-02 DIAGNOSIS — I4821 Permanent atrial fibrillation: Secondary | ICD-10-CM | POA: Diagnosis not present

## 2024-04-02 DIAGNOSIS — Z723 Lack of physical exercise: Secondary | ICD-10-CM

## 2024-04-02 DIAGNOSIS — H409 Unspecified glaucoma: Secondary | ICD-10-CM | POA: Diagnosis present

## 2024-04-02 DIAGNOSIS — F17210 Nicotine dependence, cigarettes, uncomplicated: Secondary | ICD-10-CM | POA: Diagnosis present

## 2024-04-02 DIAGNOSIS — I2510 Atherosclerotic heart disease of native coronary artery without angina pectoris: Secondary | ICD-10-CM

## 2024-04-02 DIAGNOSIS — E876 Hypokalemia: Secondary | ICD-10-CM | POA: Diagnosis present

## 2024-04-02 DIAGNOSIS — Z6827 Body mass index (BMI) 27.0-27.9, adult: Secondary | ICD-10-CM

## 2024-04-02 DIAGNOSIS — Z7901 Long term (current) use of anticoagulants: Secondary | ICD-10-CM

## 2024-04-02 DIAGNOSIS — Z7984 Long term (current) use of oral hypoglycemic drugs: Secondary | ICD-10-CM

## 2024-04-02 DIAGNOSIS — I350 Nonrheumatic aortic (valve) stenosis: Secondary | ICD-10-CM | POA: Diagnosis not present

## 2024-04-02 DIAGNOSIS — I5032 Chronic diastolic (congestive) heart failure: Secondary | ICD-10-CM

## 2024-04-02 DIAGNOSIS — E872 Acidosis, unspecified: Secondary | ICD-10-CM | POA: Diagnosis present

## 2024-04-02 DIAGNOSIS — Z820 Family history of epilepsy and other diseases of the nervous system: Secondary | ICD-10-CM

## 2024-04-02 DIAGNOSIS — E663 Overweight: Secondary | ICD-10-CM | POA: Diagnosis present

## 2024-04-02 DIAGNOSIS — I4811 Longstanding persistent atrial fibrillation: Secondary | ICD-10-CM | POA: Diagnosis not present

## 2024-04-02 DIAGNOSIS — R079 Chest pain, unspecified: Principal | ICD-10-CM

## 2024-04-02 DIAGNOSIS — M069 Rheumatoid arthritis, unspecified: Secondary | ICD-10-CM | POA: Diagnosis present

## 2024-04-02 DIAGNOSIS — E119 Type 2 diabetes mellitus without complications: Secondary | ICD-10-CM

## 2024-04-02 DIAGNOSIS — J449 Chronic obstructive pulmonary disease, unspecified: Secondary | ICD-10-CM | POA: Diagnosis not present

## 2024-04-02 DIAGNOSIS — E860 Dehydration: Secondary | ICD-10-CM | POA: Diagnosis present

## 2024-04-02 DIAGNOSIS — I130 Hypertensive heart and chronic kidney disease with heart failure and stage 1 through stage 4 chronic kidney disease, or unspecified chronic kidney disease: Secondary | ICD-10-CM | POA: Diagnosis present

## 2024-04-02 DIAGNOSIS — N1831 Chronic kidney disease, stage 3a: Secondary | ICD-10-CM | POA: Diagnosis present

## 2024-04-02 DIAGNOSIS — R001 Bradycardia, unspecified: Secondary | ICD-10-CM | POA: Diagnosis present

## 2024-04-02 DIAGNOSIS — Z66 Do not resuscitate: Secondary | ICD-10-CM | POA: Diagnosis present

## 2024-04-02 DIAGNOSIS — F419 Anxiety disorder, unspecified: Secondary | ICD-10-CM | POA: Diagnosis present

## 2024-04-02 DIAGNOSIS — E871 Hypo-osmolality and hyponatremia: Secondary | ICD-10-CM | POA: Diagnosis present

## 2024-04-02 DIAGNOSIS — J441 Chronic obstructive pulmonary disease with (acute) exacerbation: Secondary | ICD-10-CM | POA: Diagnosis present

## 2024-04-02 DIAGNOSIS — Z831 Family history of other infectious and parasitic diseases: Secondary | ICD-10-CM

## 2024-04-02 DIAGNOSIS — Z1152 Encounter for screening for COVID-19: Secondary | ICD-10-CM

## 2024-04-02 DIAGNOSIS — G3184 Mild cognitive impairment, so stated: Secondary | ICD-10-CM | POA: Diagnosis present

## 2024-04-02 DIAGNOSIS — R0602 Shortness of breath: Secondary | ICD-10-CM | POA: Diagnosis not present

## 2024-04-02 DIAGNOSIS — E1169 Type 2 diabetes mellitus with other specified complication: Secondary | ICD-10-CM | POA: Diagnosis present

## 2024-04-02 DIAGNOSIS — E1122 Type 2 diabetes mellitus with diabetic chronic kidney disease: Secondary | ICD-10-CM | POA: Diagnosis present

## 2024-04-02 DIAGNOSIS — I272 Pulmonary hypertension, unspecified: Secondary | ICD-10-CM | POA: Diagnosis present

## 2024-04-02 DIAGNOSIS — Z818 Family history of other mental and behavioral disorders: Secondary | ICD-10-CM

## 2024-04-02 DIAGNOSIS — I252 Old myocardial infarction: Secondary | ICD-10-CM

## 2024-04-02 DIAGNOSIS — I340 Nonrheumatic mitral (valve) insufficiency: Secondary | ICD-10-CM | POA: Diagnosis present

## 2024-04-02 DIAGNOSIS — R110 Nausea: Secondary | ICD-10-CM | POA: Diagnosis not present

## 2024-04-02 DIAGNOSIS — I4510 Unspecified right bundle-branch block: Secondary | ICD-10-CM | POA: Diagnosis present

## 2024-04-02 DIAGNOSIS — I10 Essential (primary) hypertension: Secondary | ICD-10-CM | POA: Diagnosis present

## 2024-04-02 LAB — CBC WITH DIFFERENTIAL/PLATELET
Abs Immature Granulocytes: 0.04 K/uL (ref 0.00–0.07)
Basophils Absolute: 0.1 K/uL (ref 0.0–0.1)
Basophils Relative: 1 %
Eosinophils Absolute: 0.2 K/uL (ref 0.0–0.5)
Eosinophils Relative: 2 %
HCT: 39 % (ref 36.0–46.0)
Hemoglobin: 11.5 g/dL — ABNORMAL LOW (ref 12.0–15.0)
Immature Granulocytes: 0 %
Lymphocytes Relative: 19 %
Lymphs Abs: 1.8 K/uL (ref 0.7–4.0)
MCH: 22.6 pg — ABNORMAL LOW (ref 26.0–34.0)
MCHC: 29.5 g/dL — ABNORMAL LOW (ref 30.0–36.0)
MCV: 76.6 fL — ABNORMAL LOW (ref 80.0–100.0)
Monocytes Absolute: 0.7 K/uL (ref 0.1–1.0)
Monocytes Relative: 8 %
Neutro Abs: 6.7 K/uL (ref 1.7–7.7)
Neutrophils Relative %: 70 %
Platelets: 319 K/uL (ref 150–400)
RBC: 5.09 MIL/uL (ref 3.87–5.11)
RDW: 19.4 % — ABNORMAL HIGH (ref 11.5–15.5)
WBC: 9.5 K/uL (ref 4.0–10.5)
nRBC: 0 % (ref 0.0–0.2)

## 2024-04-02 LAB — BASIC METABOLIC PANEL WITH GFR
Anion gap: 17 — ABNORMAL HIGH (ref 5–15)
BUN: 10 mg/dL (ref 8–23)
CO2: 20 mmol/L — ABNORMAL LOW (ref 22–32)
Calcium: 8.6 mg/dL — ABNORMAL LOW (ref 8.9–10.3)
Chloride: 98 mmol/L (ref 98–111)
Creatinine, Ser: 0.95 mg/dL (ref 0.44–1.00)
GFR, Estimated: >60 mL/min (ref >60)
Glucose, Bld: 124 mg/dL — ABNORMAL HIGH (ref 70–99)
Potassium: 3.3 mmol/L — ABNORMAL LOW (ref 3.5–5.1)
Sodium: 135 mmol/L (ref 135–145)

## 2024-04-02 LAB — TSH: TSH: 0.839 u[IU]/mL (ref 0.350–4.500)

## 2024-04-02 LAB — CBG MONITORING, ED
Glucose-Capillary: 176 mg/dL — ABNORMAL HIGH (ref 70–99)
Glucose-Capillary: 155 mg/dL — ABNORMAL HIGH (ref 70–99)

## 2024-04-02 LAB — TROPONIN I (HIGH SENSITIVITY)
Troponin I (High Sensitivity): 25 ng/L — ABNORMAL HIGH (ref <18)
Troponin I (High Sensitivity): 23 ng/L — ABNORMAL HIGH (ref <18)

## 2024-04-02 LAB — MAGNESIUM: Magnesium: 2 mg/dL (ref 1.7–2.4)

## 2024-04-02 LAB — RESP PANEL BY RT-PCR (RSV, FLU A&B, COVID)  RVPGX2
Influenza A by PCR: NEGATIVE
Influenza B by PCR: NEGATIVE
Resp Syncytial Virus by PCR: NEGATIVE
SARS Coronavirus 2 by RT PCR: NEGATIVE

## 2024-04-02 LAB — BRAIN NATRIURETIC PEPTIDE: B Natriuretic Peptide: 169 pg/mL — ABNORMAL HIGH (ref 0.0–100.0)

## 2024-04-02 MED ORDER — IPRATROPIUM-ALBUTEROL 0.5-2.5 (3) MG/3ML IN SOLN
3.0000 mL | Freq: Once | RESPIRATORY_TRACT | Status: AC
  Administered 2024-04-02: 3 mL via RESPIRATORY_TRACT
  Filled 2024-04-02: qty 3

## 2024-04-02 MED ORDER — ACETAMINOPHEN 325 MG PO TABS
650.0000 mg | ORAL_TABLET | Freq: Four times a day (QID) | ORAL | Status: DC | PRN
  Administered 2024-04-03 – 2024-04-04 (×5): 650 mg via ORAL
  Filled 2024-04-02 (×6): qty 2

## 2024-04-02 MED ORDER — BUDESONIDE 0.5 MG/2ML IN SUSP
2.0000 mg | Freq: Two times a day (BID) | RESPIRATORY_TRACT | Status: DC
  Administered 2024-04-03 – 2024-04-05 (×4): 2 mg via RESPIRATORY_TRACT
  Filled 2024-04-02 (×5): qty 8

## 2024-04-02 MED ORDER — DIAZEPAM 2 MG PO TABS
2.0000 mg | ORAL_TABLET | Freq: Once | ORAL | Status: AC
  Administered 2024-04-02: 2 mg via ORAL
  Filled 2024-04-02: qty 1

## 2024-04-02 MED ORDER — ACETAMINOPHEN 650 MG RE SUPP: 650.0000 mg | Freq: Four times a day (QID) | RECTAL | Status: DC | PRN

## 2024-04-02 MED ORDER — DIAZEPAM 5 MG/ML IJ SOLN
2.5000 mg | Freq: Once | INTRAMUSCULAR | Status: AC
  Administered 2024-04-02: 2.5 mg via INTRAVENOUS
  Filled 2024-04-02: qty 2

## 2024-04-02 MED ORDER — METHYLPREDNISOLONE SODIUM SUCC 125 MG IJ SOLR
125.0000 mg | INTRAMUSCULAR | Status: AC
  Administered 2024-04-03: 125 mg via INTRAVENOUS
  Filled 2024-04-02: qty 2

## 2024-04-02 MED ORDER — INSULIN ASPART 100 UNIT/ML IJ SOLN
0.0000 [IU] | Freq: Three times a day (TID) | INTRAMUSCULAR | Status: DC
  Administered 2024-04-02: 3 [IU] via SUBCUTANEOUS
  Administered 2024-04-03: 8 [IU] via SUBCUTANEOUS
  Administered 2024-04-03: 2 [IU] via SUBCUTANEOUS
  Administered 2024-04-03: 5 [IU] via SUBCUTANEOUS
  Administered 2024-04-04: 2 [IU] via SUBCUTANEOUS
  Administered 2024-04-04: 8 [IU] via SUBCUTANEOUS
  Administered 2024-04-04: 3 [IU] via SUBCUTANEOUS
  Administered 2024-04-05: 2 [IU] via SUBCUTANEOUS

## 2024-04-02 MED ORDER — LEVALBUTEROL HCL 0.63 MG/3ML IN NEBU: 0.6300 mg | INHALATION_SOLUTION | Freq: Four times a day (QID) | RESPIRATORY_TRACT | Status: DC | PRN

## 2024-04-02 MED ORDER — FUROSEMIDE 10 MG/ML IJ SOLN
40.0000 mg | Freq: Once | INTRAMUSCULAR | Status: AC
  Administered 2024-04-02: 40 mg via INTRAVENOUS
  Filled 2024-04-02: qty 4

## 2024-04-02 MED ORDER — DILTIAZEM HCL 25 MG/5ML IV SOLN
15.0000 mg | Freq: Once | INTRAVENOUS | Status: AC
  Administered 2024-04-02: 15 mg via INTRAVENOUS
  Filled 2024-04-02: qty 5

## 2024-04-02 MED ORDER — PREDNISONE 20 MG PO TABS
40.0000 mg | ORAL_TABLET | Freq: Every day | ORAL | Status: DC
  Administered 2024-04-04 – 2024-04-05 (×2): 40 mg via ORAL
  Filled 2024-04-02 (×2): qty 2

## 2024-04-02 MED ORDER — FUROSEMIDE 20 MG PO TABS
20.0000 mg | ORAL_TABLET | Freq: Every day | ORAL | Status: DC
  Administered 2024-04-03: 20 mg via ORAL
  Filled 2024-04-02: qty 1

## 2024-04-02 MED ORDER — LEVALBUTEROL HCL 0.63 MG/3ML IN NEBU
0.6300 mg | INHALATION_SOLUTION | Freq: Four times a day (QID) | RESPIRATORY_TRACT | Status: DC
  Administered 2024-04-02 – 2024-04-04 (×5): 0.63 mg via RESPIRATORY_TRACT
  Filled 2024-04-02 (×6): qty 3

## 2024-04-02 MED ORDER — APIXABAN 5 MG PO TABS
5.0000 mg | ORAL_TABLET | Freq: Two times a day (BID) | ORAL | Status: DC
  Administered 2024-04-02 – 2024-04-05 (×6): 5 mg via ORAL
  Filled 2024-04-02 (×6): qty 1

## 2024-04-02 MED ORDER — INSULIN ASPART 100 UNIT/ML IJ SOLN
0.0000 [IU] | Freq: Every day | INTRAMUSCULAR | Status: DC
  Administered 2024-04-03: 2 [IU] via SUBCUTANEOUS

## 2024-04-02 MED ORDER — DILTIAZEM HCL-DEXTROSE 125-5 MG/125ML-% IV SOLN (PREMIX)
5.0000 mg/h | INTRAVENOUS | Status: DC
  Administered 2024-04-02: 5 mg/h via INTRAVENOUS
  Administered 2024-04-02: 15 mg/h via INTRAVENOUS
  Administered 2024-04-02: 5 mg/h via INTRAVENOUS
  Filled 2024-04-02 (×2): qty 125

## 2024-04-02 MED ORDER — EMPAGLIFLOZIN 25 MG PO TABS
25.0000 mg | ORAL_TABLET | Freq: Every day | ORAL | Status: DC
  Administered 2024-04-04 – 2024-04-05 (×2): 25 mg via ORAL
  Filled 2024-04-02 (×4): qty 1

## 2024-04-02 MED ORDER — METHYLPREDNISOLONE SODIUM SUCC 125 MG IJ SOLR
125.0000 mg | Freq: Once | INTRAMUSCULAR | Status: AC
  Administered 2024-04-02: 125 mg via INTRAVENOUS
  Filled 2024-04-02: qty 2

## 2024-04-02 MED ORDER — TRIMETHOBENZAMIDE HCL 100 MG/ML IM SOLN
200.0000 mg | Freq: Four times a day (QID) | INTRAMUSCULAR | Status: DC | PRN
  Administered 2024-04-02 – 2024-04-04 (×3): 200 mg via INTRAMUSCULAR
  Filled 2024-04-02 (×4): qty 2

## 2024-04-02 MED ORDER — DILTIAZEM LOAD VIA INFUSION
15.0000 mg | Freq: Once | INTRAVENOUS | Status: DC
  Filled 2024-04-02: qty 15

## 2024-04-02 MED ORDER — PANTOPRAZOLE SODIUM 40 MG PO TBEC
40.0000 mg | DELAYED_RELEASE_TABLET | Freq: Every day | ORAL | Status: DC
Start: 2024-04-03 — End: 2024-04-05
  Administered 2024-04-03 – 2024-04-05 (×3): 40 mg via ORAL
  Filled 2024-04-02 (×3): qty 1

## 2024-04-02 MED ORDER — SODIUM CHLORIDE 0.9 % IV BOLUS
500.0000 mL | Freq: Once | INTRAVENOUS | Status: AC
  Administered 2024-04-02: 500 mL via INTRAVENOUS

## 2024-04-02 MED ORDER — IPRATROPIUM BROMIDE 0.02 % IN SOLN
0.5000 mg | Freq: Four times a day (QID) | RESPIRATORY_TRACT | Status: DC
  Administered 2024-04-02 – 2024-04-04 (×5): 0.5 mg via RESPIRATORY_TRACT
  Filled 2024-04-02 (×6): qty 2.5

## 2024-04-02 MED ORDER — SODIUM CHLORIDE 0.9 % IV SOLN
1.0000 g | INTRAVENOUS | Status: DC
  Administered 2024-04-02 – 2024-04-04 (×3): 1 g via INTRAVENOUS
  Filled 2024-04-02 (×3): qty 10

## 2024-04-02 MED ORDER — PRAVASTATIN SODIUM 40 MG PO TABS
40.0000 mg | ORAL_TABLET | Freq: Every day | ORAL | Status: DC
  Administered 2024-04-02 – 2024-04-04 (×3): 40 mg via ORAL
  Filled 2024-04-02 (×3): qty 1

## 2024-04-02 MED ORDER — ONDANSETRON HCL 4 MG/2ML IJ SOLN
4.0000 mg | Freq: Once | INTRAMUSCULAR | Status: AC
  Administered 2024-04-02: 4 mg via INTRAVENOUS
  Filled 2024-04-02: qty 2

## 2024-04-02 MED ORDER — POTASSIUM CHLORIDE CRYS ER 20 MEQ PO TBCR
40.0000 meq | EXTENDED_RELEASE_TABLET | Freq: Four times a day (QID) | ORAL | Status: AC
  Administered 2024-04-02 (×2): 40 meq via ORAL
  Filled 2024-04-02 (×2): qty 2

## 2024-04-02 NOTE — ED Notes (Addendum)
 Daughter-in-law Clotilda requests to be contacted when pt is moved or for any questions. [phone]

## 2024-04-02 NOTE — H&P (Signed)
 [facility] H&P   Patient Demographics:    Theresa Mendoza, is a 78 y.o. female  MRN: [id number]   DOB - 01/29/1946  Admit Date - 04/02/2024  Outpatient Primary MD for the patient is Regino, Dibas, MD  Referring MD/NP/PA: Dr Bari  Outpatient Specialists: [facility] cardiology    Patient coming from: cardiology office  Chief Complaint  Patient presents with   Chest Pain      HPI:    Theresa Mendoza  is a 78 y.o. female, with the past medical history of atrial fibrillation, aortic stenosis, pulmonary hypertension,hypertension, hyperlipidemia, CKD3a, HFpEF, rheumatoid arthritis, anxiety/depression/BPD, COPD, glaucoma, migraine headaches, mild cognitive impairment, type 2 diabetes, and chronic back pain . - Patient was sent from cardiology office for evaluation for A-fib with RVR, and dyspnea.  Patient with recent hospitalization, for A-fib with RVR, and chest pain, discharged 02/20/2024, she did follow-up with her cardiology office today, where she was noted to be in A-fib with RVR, when she was instructed to come to ED for further evaluation, she was transported to ED via EMS, she does complaints of shortness of breath, and chest pain, reports symptoms started yesterday, he does report dry cough, wheezing, as well palpitation and racing heart rate, she has some mild lower extremity edema as well, denies fever or chills - In ED she was noted to be in A-fib with RVR, heart rate on EKG showing 131, blood work significant for hypokalemia potassium of 3.3, BNP elevated at 169 (recent BNP was 376 last month), opponents mildly elevated at 25, no acute finding on chest x-ray, was started on IV steroids and Cardizem  drip in ED, and [facility] hospitalist requested to admit.    Review of systems:      A full 10 point Review of Systems was done, except as stated above, all other Review of Systems were  negative.   With Past History of the following :    Past Medical History:  Diagnosis Date   A-fib (HCC)    Anxiety    Bipolar 1 disorder (HCC)    Bowel obstruction (HCC)    Cataract    Chronic atrial fibrillation (HCC) 09/2014   COPD (chronic obstructive pulmonary disease) (HCC)    Dyslipidemia, goal LDL below 70 12/25/2015   Encounter for tobacco use cessation counseling 12/04/2017   Glaucoma    Hx of migraine headaches    Memory loss    Mitral regurgitation    mild to moderate on echo 09/2014   Old MI (myocardial infarction) 09/2014   cath with normal coronary arteries and EF 50-55%   Osteoarthritis    Pulmonary HTN (HCC) 09/2014   Mild with PASP at cath   RBBB    Rheumatoid arthritis (HCC)    Tobacco abuse       Past Surgical History:  Procedure Laterality Date   ADENOIDECTOMY     BACK SURGERY  bowel obstruction     CARDIAC CATHETERIZATION  09/2014   normal coronary arteries with mild pulmonary HTN, low normal LVF   CHOLECYSTECTOMY     NOSE SURGERY     TONSILLECTOMY     TRANSESOPHAGEAL ECHOCARDIOGRAM WITH CARDIOVERSION  09/2014   VAGINAL HYSTERECTOMY     with BSO      Social History:     Social History   Tobacco Use   Smoking status: Every Day    Current packs/day: 1.00    Average packs/day: 1 pack/day for 60.0 years (60.0 ttl pk-yrs)    Types: Cigarettes    Passive exposure: Current   Smokeless tobacco: Never   Tobacco comments:    03/26/2024 Patient smokes a pack daily    Doesn't want to quit. 01/24/2024  Substance Use Topics   Alcohol use: No    Alcohol/week: 0.0 standard drinks of alcohol       Family History :     Family History  Problem Relation Age of Onset   Alzheimer's disease Mother    Lung cancer Mother    Tuberculosis Father    Depression Father       Home Medications:   Prior to Admission medications   Medication Sig Start Date End Date Taking? Authorizing Provider  apixaban  (ELIQUIS ) 5 MG TABS tablet Take 1  tablet (5 mg total) by mouth 2 (two) times daily. NEEDS CARDIOLOGY APPT FOR ELIQUIS  REFILLS, PLEASE CALL OFFICE 02/20/24   Arrien, Elidia Sieving, MD  diltiazem  (CARDIZEM  CD) 120 MG 24 hr capsule Take 1 capsule (120 mg total) by mouth every evening. 03/26/24   Lelon Hamilton T, PA-C  diltiazem  (CARDIZEM  CD) 240 MG 24 hr capsule Take 1 capsule (240 mg total) by mouth daily. 02/20/24 04/02/24  Arrien, Mauricio Daniel, MD  DULoxetine  (CYMBALTA ) 60 MG capsule TAKE 2 CAPSULES BY MOUTH EVERY DAY 10/09/19   Plovsky, Elna, MD  empagliflozin  (JARDIANCE ) 25 MG TABS tablet Take 1 tablet (25 mg total) by mouth daily. 02/20/24   Arrien, Elidia Sieving, MD  furosemide  (LASIX ) 20 MG tablet Take 20 mg by mouth every other day.    [provider]  HYDROcodone -acetaminophen  (NORCO) 10-325 MG tablet Take 1 tablet by mouth 3 (three) times daily as needed. 03/09/24   [provider]  losartan  (COZAAR ) 25 MG tablet TAKE 1 TABLET (25 MG TOTAL) BY MOUTH DAILY. 04/13/22   Shlomo Wilbert SAUNDERS, MD  metFORMIN  (GLUCOPHAGE ) 500 MG tablet Take 1 tablet (500 mg total) by mouth 2 (two) times daily with a meal. Patient taking differently: Take 500 mg by mouth daily. 10/15/21 04/26/24  Gonfa, Taye T, MD  metoprolol  succinate (TOPROL -XL) 100 MG 24 hr tablet Take 1 tablet (100 mg total) by mouth daily. Take with or immediately following a meal. 02/21/24   Arrien, Elidia Sieving, MD  nitroGLYCERIN  (NITROSTAT ) 0.4 MG SL tablet Place 1 tablet (0.4 mg total) under the tongue every 5 (five) minutes as needed for chest pain. 09/24/23   Cheryle Page, MD  omeprazole (PRILOSEC) 40 MG capsule Take 40 mg by mouth daily as needed (heartburn). 09/07/20   [provider]  ondansetron  (ZOFRAN ) 4 MG tablet Take 4 mg by mouth daily at 6 (six) AM. 02/26/24   [provider]  pravastatin  (PRAVACHOL ) 40 MG tablet Take 1 tablet by mouth daily. 03/17/21   [provider]     Allergies:     Allergies  Allergen Reactions    Lithobid [Lithium] Other (See Comments)    Suicidal  ideation   Tegretol [Carbamazepine] Other (See Comments)    Melted her skin     Physical Exam:   Vitals  Blood pressure (!) 137/110, pulse 97, temperature 98.6 F (37 C), temperature source Oral, resp. rate 18, SpO2 97%.   1. General Well-developed female, laying in bed, no apparent distress  2. Normal affect and insight, Not Suicidal or Homicidal, Awake Alert, Oriented X 3.  3. No F.N deficits, ALL C.Nerves Intact, Strength 5/5 all 4 extremities, Sensation intact all 4 extremities, Plantars down going.  4. Ears and Eyes appear Normal, Conjunctivae clear, PERRLA. Moist Oral Mucosa.  5. Supple Neck, No JVD, No cervical lymphadenopathy appriciated, No Carotid Bruits.  6. Symmetrical Chest wall movement, air entry with diffuse wheezing bilaterally.  7.  irregular irregular, tachycardic, No Gallops, Rubs or Murmurs, No Parasternal Heave.  8. Positive Bowel Sounds, Abdomen Soft, No tenderness, No organomegaly appriciated,No rebound -guarding or rigidity.  9.  No Cyanosis, Normal Skin Turgor, No Skin Rash or Bruise.  10. Good muscle tone,  joints appear normal , no effusions, Normal ROM.     Data Review:    CBC Recent Labs  Lab 04/02/24 1249  WBC 9.5  HGB 11.5*  HCT 39.0  PLT 319  MCV 76.6*  MCH 22.6*  MCHC 29.5*  RDW 19.4*  LYMPHSABS 1.8  MONOABS 0.7  EOSABS 0.2  BASOSABS 0.1   ------------------------------------------------------------------------------------------------------------------  Chemistries  Recent Labs  Lab 04/02/24 1249  NA 135  K 3.3*  CL 98  CO2 20*  GLUCOSE 124*  BUN 10  CREATININE 0.95  CALCIUM  8.6*  MG 2.0   ------------------------------------------------------------------------------------------------------------------ estimated creatinine clearance is 50.4 mL/min (by C-G formula based on SCr of 0.95  mg/dL). ------------------------------------------------------------------------------------------------------------------ No results for input(s): TSH, T4TOTAL, T3FREE, THYROIDAB in the last 72 hours.  Invalid input(s): FREET3  Coagulation profile No results for input(s): INR, PROTIME in the last 168 hours. ------------------------------------------------------------------------------------------------------------------- No results for input(s): DDIMER in the last 72 hours. -------------------------------------------------------------------------------------------------------------------  Cardiac Enzymes No results for input(s): CKMB, TROPONINI, MYOGLOBIN in the last 168 hours.  Invalid input(s): CK ------------------------------------------------------------------------------------------------------------------    Component Value Date/Time   BNP 169.0 (H) 04/02/2024 1249     ---------------------------------------------------------------------------------------------------------------  Urinalysis No results found for: COLORURINE, APPEARANCEUR, LABSPEC, PHURINE, GLUCOSEU, HGBUR, BILIRUBINUR, KETONESUR, PROTEINUR, UROBILINOGEN, NITRITE, LEUKOCYTESUR  ----------------------------------------------------------------------------------------------------------------   Imaging Results:    DG Chest Port 1 View Result Date: 04/02/2024 CLINICAL DATA:  cough EXAM: PORTABLE CHEST - 1 VIEW COMPARISON:  02/14/2024 FINDINGS: Chronic coarsening of the pulmonary interstitium without overt pulmonary edema. No focal airspace consolidation, pleural effusion, or pneumothorax. No cardiomegaly. Tortuous aorta with aortic atherosclerosis. No acute fracture or destructive lesions. Multilevel thoracic osteophytosis. IMPRESSION: No acute cardiopulmonary abnormality. Electronically Signed   By: Rogelia Myers M.D.   On: 04/02/2024 13:45    EKG: Vent. rate  131 BPM PR interval * ms QRS duration 140 ms QT/QTcB 328/484 ms P-R-T axes * -76 96 Atrial fibrillation with rapid ventricular response Right bundle branch block Left anterior fascicular block Bifascicular block Minimal voltage criteria for LVH, may be normal variant No significant change since last tracing Confirmed by Lelon Hamilton [phone]) on 04/02/2024 9:43:39 AM    Assessment & Plan:    Active Problems:   Essential hypertension   Type 2 diabetes mellitus with hyperlipidemia (HCC)   Hyperlipidemia   Stage 3a chronic kidney disease (HCC)   Atrial fibrillation with RVR (HCC)    Permanent A-fib with RVR -Heart rate uncontrolled, in the 130s,  currently on Cardizem  drip. - Will resume Eliquis  for anticoagulation. - Management per cardiology/electrophysiology, she is overall difficult to control, most likely due to underlying lung disease, has been on 2 agents Cardizem  and Toprol -XL at home which were controlled, she had symptomatic bradycardia when dig was added at the third agent.  COPD exacerbation - She is with significant wheezing, dyspnea presentation, she reports cough, with productive phlegm. - Admitted under COPD pathway, will continue with Xopenex /ipratropium scheduled, will avoid albuterol  due to A-fib with RVR. - Will start on scheduled inhaled steroid Pulmicort , she can be transitioned to Trelegy on discharge - He has been tried on Advair and Trelegy in the past without much improvement but she did not follow-up , she will need to follow with her pulmonary on discharge, will need ambulatory referral to pulmonary on discharge.  Hypokalemia -Replaced  Chest pain -Not typical, related to COPD, recent nuclear stress test no evidence of ischemia or infarction, troponins mildly elevated in the setting of A-fib with RVR, and COPD exacerbation   Essential hypertension Hold losartan  and metoprolol  to allow more room for diuresis, and Cardizem  drip.    Heart failure with  improved ejection fraction -Received IV Lasix  in ED, at this point appears to be euvolemic, will continue with home dose Lasix . -Echocardiogram from 09/2023 with RVSP 45.9 mmHg, LA and RA with mild dilatation, mild mitral valve stenosis, moderate aortic valve stenosis.      CKD stage 3a, GFR 45-59 ml/min (HCC) -At baseline, avoid nephrotoxic medications    Type 2 diabetes mellitus with hyperlipidemia (HCC) -Keep on insulin  sliding scale during hospital stay as she is on steroids. - Hold on metformin  -Continue Jardiance  for both CHF and diabetes mellitus (will put start date from tomorrow given mild metabolic acidosis with anion gap of 17 and bicarb of 20)   Tobacco abuse -She was counseled, will start on nicotine  patch       DVT Prophylaxis Eliquis   AM Labs Ordered, also please review Full Orders  Family Communication: Admission, patients condition and plan of care including tests being ordered have been discussed with the patient who indicate understanding and agree with the plan and Code Status.  Code Status DNR/DNI  Likely DC to home  Consults called: EP cardiology  Admission status: Observation  Time spent in minutes : 70 minutes   Brayton Lye M.D on 04/02/2024 at 3:51 PM   [facility] - Office  [phone]

## 2024-04-02 NOTE — Consult Note (Signed)
 Cardiology Consultation   Patient ID: Theresa Mendoza MRN: [id number]; DOB: 30-Oct-1945  Admit date: 04/02/2024 Date of Consult: 04/02/2024  PCP:  Regino Slater, MD   [facility] Providers Cardiologist:  Wilbert Bihari, MD     Patient Profile: Theresa Mendoza is a 78 y.o. female with a hx of  COPD (ongoing smoker), p.HTN, HLD, RA CAD (non-obstructive by record) >> MPI 02/19/2024: No ischemia or infarction, EF 50, low risk  AFib (desribed as permanent) VHD (mild MR, mod AS RBBB  who is being seen 04/02/2024 for the evaluation of possible tachy-brady at the request of Glendia Ferrier, PA-C.  History of Present Illness: [name] of late  Recently saw Scott on  03/26/2024 for posthospitalization follow-up.  She had been admitted with chest pain in the setting of COPD exacerbation complicated by atrial fibrillation with rapid rate.  When seen last week, her heart rate was uncontrolled.  I increased her diltiazem  to 240 mg in the morning and 120 mg in the evening and brought her back for close follow-up.   In their visit today feeling poorly,reported some CP, nausea, that perhaps associated with he RVR, SOB (no symptoms of orthopnea/PND) EKG was AFib 131bpm Not felt amiodarone  candidate with what sounds perhaps advanced (advancing COPD with recent exacerbations) Did not think further out patient medication titration was reasonable given her rate, symptoms ad prior issues with bradycardia Sent to the ER via EMS for further evaluation (IM admission for her COPD) and EP consult for consideration of PPM for tachy/brady (+/- AV node ablation)   Symptoms felt likely multifactorial with COPD (wheezing) and afib/RVR Not felt to be voume OL  Current home rate control meds: Dilt 360mg  daily  (120mg  PM and and 240mg  AM) Metoprolol  succ 100mg  daily (Previously intol of dig w/bradycardia)  LABS K+ 3.3 BUN/Creat 10/0.95 Mag 2.0 BNP 169 HS trop 25 WBC 9.5 H/H 11/39 Plts  319  Resp panel pending  She has gotten IV dilt here 15mg  and neb Dilt gtt  She is now s/p nebulizer and does fel better On dilt HRs 90's-120's Denies any CP Some SOB, still wheezing a bit, but not in any distress.   Arrhythmia/AAD history: Afib is described as permanent April this year dig stopped 2/2 braycardia   Past Medical History:  Diagnosis Date   A-fib [facility])    Anxiety    Bipolar 1 disorder (HCC)    Bowel obstruction (HCC)    Cataract    Chronic atrial fibrillation (HCC) 09/2014   COPD (chronic obstructive pulmonary disease) (HCC)    Dyslipidemia, goal LDL below 70 12/25/2015   Encounter for tobacco use cessation counseling 12/04/2017   Glaucoma    Hx of migraine headaches    Memory loss    Mitral regurgitation    mild to moderate on echo 09/2014   Old MI (myocardial infarction) 09/2014   cath with normal coronary arteries and EF 50-55%   Osteoarthritis    Pulmonary HTN (HCC) 09/2014   Mild with PASP at cath   RBBB    Rheumatoid arthritis (HCC)    Tobacco abuse     Past Surgical History:  Procedure Laterality Date   ADENOIDECTOMY     BACK SURGERY     bowel obstruction     CARDIAC CATHETERIZATION  09/2014   normal coronary arteries with mild pulmonary HTN, low normal LVF   CHOLECYSTECTOMY     NOSE SURGERY     TONSILLECTOMY     TRANSESOPHAGEAL  ECHOCARDIOGRAM WITH CARDIOVERSION  09/2014   VAGINAL HYSTERECTOMY     with BSO     Home Medications:  Prior to Admission medications   Medication Sig Start Date End Date Taking? Authorizing Provider  apixaban  (ELIQUIS ) 5 MG TABS tablet Take 1 tablet (5 mg total) by mouth 2 (two) times daily. NEEDS CARDIOLOGY APPT FOR ELIQUIS  REFILLS, PLEASE CALL OFFICE 02/20/24   Arrien, Elidia Sieving, MD  diltiazem  (CARDIZEM  CD) 120 MG 24 hr capsule Take 1 capsule (120 mg total) by mouth every evening. 03/26/24   Lelon Hamilton T, PA-C  diltiazem  (CARDIZEM  CD) 240 MG 24 hr capsule Take 1 capsule (240 mg total) by mouth  daily. 02/20/24 04/02/24  Arrien, Elidia Sieving, MD  DULoxetine  (CYMBALTA ) 60 MG capsule TAKE 2 CAPSULES BY MOUTH EVERY DAY 10/09/19   Plovsky, Elna, MD  empagliflozin  (JARDIANCE ) 25 MG TABS tablet Take 1 tablet (25 mg total) by mouth daily. 02/20/24   Arrien, Elidia Sieving, MD  furosemide  (LASIX ) 20 MG tablet Take 20 mg by mouth every other day.    [provider]  HYDROcodone -acetaminophen  (NORCO) 10-325 MG tablet Take 1 tablet by mouth 3 (three) times daily as needed. 03/09/24   [provider]  losartan  (COZAAR ) 25 MG tablet TAKE 1 TABLET (25 MG TOTAL) BY MOUTH DAILY. 04/13/22   Shlomo Wilbert SAUNDERS, MD  metFORMIN  (GLUCOPHAGE ) 500 MG tablet Take 1 tablet (500 mg total) by mouth 2 (two) times daily with a meal. Patient taking differently: Take 500 mg by mouth daily. 10/15/21 04/26/24  Gonfa, Taye T, MD  metoprolol  succinate (TOPROL -XL) 100 MG 24 hr tablet Take 1 tablet (100 mg total) by mouth daily. Take with or immediately following a meal. 02/21/24   Arrien, Elidia Sieving, MD  nitroGLYCERIN  (NITROSTAT ) 0.4 MG SL tablet Place 1 tablet (0.4 mg total) under the tongue every 5 (five) minutes as needed for chest pain. 09/24/23   Cheryle Page, MD  omeprazole (PRILOSEC) 40 MG capsule Take 40 mg by mouth daily as needed (heartburn). 09/07/20   [provider]  ondansetron  (ZOFRAN ) 4 MG tablet Take 4 mg by mouth daily at 6 (six) AM. 02/26/24   [provider]  pravastatin  (PRAVACHOL ) 40 MG tablet Take 1 tablet by mouth daily. 03/17/21   [provider]    Scheduled Meds:  Continuous Infusions:  PRN Meds:   Allergies:    Allergies  Allergen Reactions   Lithobid [Lithium] Other (See Comments)    Suicidal ideation   Tegretol [Carbamazepine] Other (See Comments)    Melted her skin    Social History:   Social History   Socioeconomic History   Marital status: Widowed    Spouse name: Not on file   Number of children: 1   Years of education: 76   Highest  education level: High school graduate  Occupational History   Occupation: retired  Tobacco Use   Smoking status: Every Day    Current packs/day: 1.00    Average packs/day: 1 pack/day for 60.0 years (60.0 ttl pk-yrs)    Types: Cigarettes    Passive exposure: Current   Smokeless tobacco: Never   Tobacco comments:    03/26/2024 Patient smokes a pack daily    Doesn't want to quit. 01/24/2024  Vaping Use   Vaping status: Never Used  Substance and Sexual Activity   Alcohol use: No    Alcohol/week: 0.0 standard drinks of alcohol   Drug use: No   Sexual activity: Never  Other Topics Concern  Not on file  Social History Narrative   Lives with son and daughter in law in a 2 story home.  Has 1 son.  Retired from [employer] after 31 years.   Education: high school.  Widowed. Married 4 times. Last husband  married for 20 years. Had a dtr that lived 4 days, a son died at birth, cord around his neck.    [name] is the son lives with wife. No grandkids.    Likes to read and watch tv.       Caffeine- 1-2 coffee per day, 1 coke or tea a few times a month.   Legal-none   Religion-Christian, brought up [employer] Drivers of Health   Financial Resource Strain: Low Risk  (01/24/2024)   Overall Financial Resource Strain (CARDIA)    Difficulty of Paying Living Expenses: Not hard at all  Food Insecurity: No Food Insecurity (02/15/2024)   Hunger Vital Sign    Worried About Running Out of Food in the Last Year: Never true    Ran Out of Food in the Last Year: Never true  Transportation Needs: No Transportation Needs (02/15/2024)   PRAPARE - Administrator, Civil Service (Medical): No    Lack of Transportation (Non-Medical): No  Physical Activity: Inactive (01/24/2024)   Exercise Vital Sign    Days of Exercise per Week: 0 days    Minutes of Exercise per Session: 0 min  Stress: Not on file  Social Connections: Socially Isolated (02/15/2024)   Social Connection and Isolation Panel    Frequency of  Communication with Friends and Family: Never    Frequency of Social Gatherings with Friends and Family: Never    Attends Religious Services: Never    Database administrator or Organizations: No    Attends Banker Meetings: Never    Marital Status: Married  Catering manager Violence: Not At Risk (02/15/2024)   Humiliation, Afraid, Rape, and Kick questionnaire    Fear of Current or Ex-Partner: No    Emotionally Abused: No    Physically Abused: No    Sexually Abused: No    Family History:   Family History  Problem Relation Age of Onset   Alzheimer's disease Mother    Lung cancer Mother    Tuberculosis Father    Depression Father      ROS:  Please see the history of present illness.  All other ROS reviewed and negative.     Physical Exam/Data: Vitals:   04/02/24 1103 04/02/24 1345 04/02/24 1415 04/02/24 1451  BP: 121/77 137/79 95/74   Pulse: 95 66 (!) 133   Resp: 18  18   Temp: 97.8 F (36.6 C)   98.6 F (37 C)  TempSrc:    Oral  SpO2: 92% 97% 95%    No intake or output data in the 24 hours ending 04/02/24 1503    04/02/2024    9:36 AM 03/26/2024   12:56 PM 02/20/2024    1:04 AM  Last 3 Weights  Weight (lbs) 160 lb 12.8 oz 159 lb 152 lb 12.5 oz  Weight (kg) 72.938 kg 72.122 kg 69.3 kg     There is no height or weight on file to calculate BMI.  General:  Well nourished, well developed, in no acute distress HEENT: normal Neck: no JVD Vascular: No carotid bruits; Distal pulses 2+ bilaterally Cardiac:  irreg-irreg; no murmurs, gallops or rubs Lungs:  in/exp wheezing, no rhonchi or rales  Abd: soft, nontender  Ext: no edema Musculoskeletal:  No deformities Skin: warm and dry  Neuro:  no focal abnormalities noted Psych:  Normal affect   EKG:  The EKG was personally reviewed and demonstrates:    AFib 131bpm, RBBB, LAD  Telemetry:  Telemetry was personally reviewed and demonstrates:   AFib 90's-120's (on dilt gtt)  Relevant CV Studies:  MPI 02/01/16:  no ischemia, EF 65 MPI 02/19/2024: No ischemia or infarction, EF 50, low risk TTE 10/03/2020: EF 40-45, mild MR, mild-moderate aortic stenosis (mean gradient 14.5, V-max 246 cm/s, DI 0.44) TTE 09/24/23: EF 60-65, no RWMA, NL RVSF, mod pulmonary HTN, RVSP 45.9, mild BAE, no MR, mild MS (mean MV gradient 6 mmHg), severe AV Ca2+, mild AI, mod AS (mean AV gradient 33 mmHg, Vmax 357.6 cm/s, DI 0.48)  Laboratory Data: High Sensitivity Troponin:   Recent Labs  Lab 04/02/24 1249  TROPONINIHS 25*     Chemistry Recent Labs  Lab 04/02/24 1249  NA 135  K 3.3*  CL 98  CO2 20*  GLUCOSE 124*  BUN 10  CREATININE 0.95  CALCIUM  8.6*  MG 2.0  GFRNONAA >60  ANIONGAP 17*    No results for input(s): PROT, ALBUMIN, AST, ALT, ALKPHOS, BILITOT in the last 168 hours. Lipids No results for input(s): CHOL, TRIG, HDL, LABVLDL, LDLCALC, CHOLHDL in the last 168 hours.  Hematology Recent Labs  Lab 04/02/24 1249  WBC 9.5  RBC 5.09  HGB 11.5*  HCT 39.0  MCV 76.6*  MCH 22.6*  MCHC 29.5*  RDW 19.4*  PLT 319   Thyroid  No results for input(s): TSH, FREET4 in the last 168 hours.  BNP Recent Labs  Lab 04/02/24 1249  BNP 169.0*    DDimer No results for input(s): DDIMER in the last 168 hours.  Radiology/Studies:  DG Chest Port 1 View Result Date: 04/02/2024 CLINICAL DATA:  cough EXAM: PORTABLE CHEST - 1 VIEW COMPARISON:  02/14/2024 FINDINGS: Chronic coarsening of the pulmonary interstitium without overt pulmonary edema. No focal airspace consolidation, pleural effusion, or pneumothorax. No cardiomegaly. Tortuous aorta with aortic atherosclerosis. No acute fracture or destructive lesions. Multilevel thoracic osteophytosis. IMPRESSION: No acute cardiopulmonary abnormality. Electronically Signed   By: Rogelia Myers M.D.   On: 04/02/2024 13:45     Assessment and Plan:  Permanent AFib (reviewed EKGs going back to 2022 are all AFib) CHA2DS2Vasc is 4 (with hx of prior mild  CM), on eliquis  out pt She has baseline conduction system disease w/RBBB and LAD She is 2 nodal blocking agents with poor rate control  She has had marked and symptomatic bradycardia with dig (as a 3rd agent) Agree w/tachy-brady and may benefit from PPM +/- AV node ablation if with pacer unable to control HR Her COPD may make that challenging  In d/w the patient, review of meds She is not on any pulmonary maintenance or have any rescue inhalers  She is wheezing but reports more comfortable post neb tx (97% on RA)  I think we 1st need to work on her lungs >> deferred to IM team Her HRs are improved, not perfect Follow on the dilt gtt Continue eliquis   We will follow along    For questions or updates, please contact  [facility] Please consult [email or web address] for contact info under   Signed, Charlies Macario Arthur, PA-C  04/02/2024 3:03 PM

## 2024-04-02 NOTE — ED Provider Notes (Signed)
 [place] EMERGENCY DEPARTMENT AT [facility] Provider Note   CSN: [id number] Arrival date & time: 04/02/24  1100     Patient presents with: Chest Pain   Theresa Mendoza is a 78 y.o. female.   HPI   78 year old female with past medical history of A-fib on rate control and anticoagulation, said to be compliance, presents emergency department from the MD office with concern for chest pain, shortness of breath and tachycardia.  Patient states that this started about yesterday.  She endorses a dry cough, wheezing along with palpitations and racing heartbeat.  She has had some mild edema of the bilateral feet/ankles.  No recent fever.  Prior to Admission medications   Medication Sig Start Date End Date Taking? Authorizing Provider  apixaban  (ELIQUIS ) 5 MG TABS tablet Take 1 tablet (5 mg total) by mouth 2 (two) times daily. NEEDS CARDIOLOGY APPT FOR ELIQUIS  REFILLS, PLEASE CALL OFFICE 02/20/24   Arrien, Elidia Sieving, MD  diltiazem  (CARDIZEM  CD) 120 MG 24 hr capsule Take 1 capsule (120 mg total) by mouth every evening. 03/26/24   Lelon Hamilton T, PA-C  diltiazem  (CARDIZEM  CD) 240 MG 24 hr capsule Take 1 capsule (240 mg total) by mouth daily. 02/20/24 04/02/24  Arrien, Elidia Sieving, MD  DULoxetine  (CYMBALTA ) 60 MG capsule TAKE 2 CAPSULES BY MOUTH EVERY DAY 10/09/19   Plovsky, Elna, MD  empagliflozin  (JARDIANCE ) 25 MG TABS tablet Take 1 tablet (25 mg total) by mouth daily. 02/20/24   Arrien, Elidia Sieving, MD  furosemide  (LASIX ) 20 MG tablet Take 20 mg by mouth every other day.    [provider]  HYDROcodone -acetaminophen  (NORCO) 10-325 MG tablet Take 1 tablet by mouth 3 (three) times daily as needed. 03/09/24   [provider]  losartan  (COZAAR ) 25 MG tablet TAKE 1 TABLET (25 MG TOTAL) BY MOUTH DAILY. 04/13/22   Shlomo Wilbert SAUNDERS, MD  metFORMIN  (GLUCOPHAGE ) 500 MG tablet Take 1 tablet (500 mg total) by mouth 2 (two) times daily with a meal. Patient taking  differently: Take 500 mg by mouth daily. 10/15/21 04/26/24  Gonfa, Taye T, MD  metoprolol  succinate (TOPROL -XL) 100 MG 24 hr tablet Take 1 tablet (100 mg total) by mouth daily. Take with or immediately following a meal. 02/21/24   Arrien, Elidia Sieving, MD  nitroGLYCERIN  (NITROSTAT ) 0.4 MG SL tablet Place 1 tablet (0.4 mg total) under the tongue every 5 (five) minutes as needed for chest pain. 09/24/23   Cheryle Page, MD  omeprazole (PRILOSEC) 40 MG capsule Take 40 mg by mouth daily as needed (heartburn). 09/07/20   [provider]  ondansetron  (ZOFRAN ) 4 MG tablet Take 4 mg by mouth daily at 6 (six) AM. 02/26/24   [provider]  pravastatin  (PRAVACHOL ) 40 MG tablet Take 1 tablet by mouth daily. 03/17/21   [provider]    Allergies: Lithobid [lithium] and Tegretol [carbamazepine]    Review of Systems  Constitutional:  Positive for fatigue. Negative for fever.  Respiratory:  Positive for cough, shortness of breath and wheezing.   Cardiovascular:  Positive for chest pain, palpitations and leg swelling.  Gastrointestinal:  Negative for abdominal pain, diarrhea and vomiting.  Skin:  Negative for rash.  Neurological:  Negative for headaches.    Updated Vital Signs BP 95/74 (BP Location: Left Arm)   Pulse (!) 133   Temp 98.6 F (37 C) (Oral)   Resp 18   SpO2 95%   Physical Exam Vitals and nursing note reviewed.  Constitutional:  Appearance: Normal appearance. She is ill-appearing.  HENT:     Head: Normocephalic.     Mouth/Throat:     Mouth: Mucous membranes are moist.  Cardiovascular:     Rate and Rhythm: Tachycardia present. Rhythm irregular.  Pulmonary:     Effort: Pulmonary effort is normal. No respiratory distress.     Breath sounds: Decreased breath sounds and wheezing present.  Abdominal:     Palpations: Abdomen is soft.     Tenderness: There is no abdominal tenderness.  Musculoskeletal:     Comments: Trace edema the bilateral feet/ankles   Skin:    General: Skin is warm.  Neurological:     Mental Status: She is alert and oriented to person, place, and time. Mental status is at baseline.  Psychiatric:        Mood and Affect: Mood normal.     (all labs ordered are listed, but only abnormal results are displayed) Labs Reviewed  CBC WITH DIFFERENTIAL/PLATELET - Abnormal; Notable for the following components:      Result Value   Hemoglobin 11.5 (*)    MCV 76.6 (*)    MCH 22.6 (*)    MCHC 29.5 (*)    RDW 19.4 (*)    All other components within normal limits  BASIC METABOLIC PANEL WITH GFR - Abnormal; Notable for the following components:   Potassium 3.3 (*)    CO2 20 (*)    Glucose, Bld 124 (*)    Calcium  8.6 (*)    Anion gap 17 (*)    All other components within normal limits  BRAIN NATRIURETIC PEPTIDE - Abnormal; Notable for the following components:   B Natriuretic Peptide 169.0 (*)    All other components within normal limits  TROPONIN I (HIGH SENSITIVITY) - Abnormal; Notable for the following components:   Troponin I (High Sensitivity) 25 (*)    All other components within normal limits  RESP PANEL BY RT-PCR (RSV, FLU A&B, COVID)  RVPGX2  MAGNESIUM   TROPONIN I (HIGH SENSITIVITY)    EKG: None  Radiology: DG Chest Port 1 View Result Date: 04/02/2024 CLINICAL DATA:  cough EXAM: PORTABLE CHEST - 1 VIEW COMPARISON:  02/14/2024 FINDINGS: Chronic coarsening of the pulmonary interstitium without overt pulmonary edema. No focal airspace consolidation, pleural effusion, or pneumothorax. No cardiomegaly. Tortuous aorta with aortic atherosclerosis. No acute fracture or destructive lesions. Multilevel thoracic osteophytosis. IMPRESSION: No acute cardiopulmonary abnormality. Electronically Signed   By: Rogelia Myers M.D.   On: 04/02/2024 13:45     .Critical Care  Performed by: Bari Roxie HERO, DO Authorized by: Bari Roxie HERO, DO   Critical care provider statement:    Critical care time (minutes):  30    Critical care time was exclusive of:  Separately billable procedures and treating other patients   Critical care was necessary to treat or prevent imminent or life-threatening deterioration of the following conditions:  Circulatory failure   Critical care was time spent personally by me on the following activities:  Development of treatment plan with patient or surrogate, discussions with consultants, evaluation of patient's response to treatment, examination of patient, ordering and review of laboratory studies, ordering and review of radiographic studies, ordering and performing treatments and interventions, pulse oximetry, re-evaluation of patient's condition and review of old charts   I assumed direction of critical care for this patient from another provider in my specialty: no     Care discussed with: admitting provider      Medications Ordered in the  ED  ipratropium-albuterol  (DUONEB) 0.5-2.5 (3) MG/3ML nebulizer solution 3 mL (has no administration in time range)  methylPREDNISolone  sodium succinate (SOLU-MEDROL ) 125 mg/2 mL injection 125 mg (has no administration in time range)  diltiazem  (CARDIZEM ) 125 mg in dextrose  5% 125 mL (1 mg/mL) infusion (has no administration in time range)  ipratropium-albuterol  (DUONEB) 0.5-2.5 (3) MG/3ML nebulizer solution 3 mL (3 mLs Nebulization Given 04/02/24 1257)  sodium chloride  0.9 % bolus 500 mL (0 mLs Intravenous Stopped 04/02/24 1403)  diltiazem  (CARDIZEM ) injection 15 mg (15 mg Intravenous Given 04/02/24 1255)                                    Medical Decision Making Amount and/or Complexity of Data Reviewed Labs: ordered. Radiology: ordered.  Risk Prescription drug management.   78 year old female presents emergency department with chest pain, shortness of breath, palpitations.  Came from the MD office where she was found to be tachycardic.  History of A-fib, compliant with medications.  Patient is tachycardic on arrival, atrial  fibrillation with RVR, stable blood pressure.  She has diffuse wheezing, trace edema of the ankles.  EKG shows atrial fibrillation, no acute ischemic changes.  Blood work shows mild hypokalemia, normal magnesium .  CBC is unremarkable baseline.  First troponin is 25, up from a baseline of 12.  Chest x-ray is reassuring, respiratory panel is pending.  After IV dose of Cardizem  as well as breathing treatment patient continues to be tachycardic, A-fib with RVR.  Patient continues to have diffuse wheezing, shortness of breath on exam.  Patient will be transition to Cardizem  drip and admitted for atrial fibrillation with RVR with a COPD exacerbation.  Respiratory panel repeat troponin is pending.  Patients evaluation and results requires admission for further treatment and care.  Spoke with hospitalist, reviewed patient's ED course and they accept admission.  Patient agrees with admission plan, offers no new complaints and is stable/unchanged at time of admit.     Final diagnoses:  None    ED Discharge Orders     None          Bari Roxie HERO, DO 04/02/24 1508

## 2024-04-02 NOTE — ED Triage Notes (Signed)
 Pt here from MD office with c/o cp and sob and afib rate 120's pt received 324 asa and 4 zofran  and 1 nitroglycerin 

## 2024-04-02 NOTE — Assessment & Plan Note (Signed)
 She has a history of permanent atrial fibrillation.  She was previously on digoxin  for many years with controlled rate.  In April of this year, she had significant bradycardia and was symptomatic.  We stopped her digoxin  with improved heart rate and resolution of symptoms.  She was admitted to the hospital recently with COPD exacerbation and atrial fibrillation with rapid rate.  I saw her in follow-up last week.  She continued to have elevated heart rates.  I adjusted her diltiazem .  Her heart rates are still elevated.  She is symptomatic.  She notes worsening shortness of breath wheezing and chest discomfort.  She is also nauseated.  Her family is not certain that they can get her back to the car without assistance.  Given her severe lung disease and moderate aortic stenosis in the setting of atrial fibrillation with rapid rate that is not responding to oral rate control therapy, I think she needs admission to the hospital for further evaluation and management.  COPD exacerbation may be the driving force behind all of this.  I reviewed her case with Dr. Wonda (attending MD) who agreed.  He is not a candidate for amiodarone  for rate control given her lung disease. -Transport to [facility] via EMS -Continue Eliquis  5 mg twice daily -Recommend she be admitted by internal medicine service and cardiology will consult -She should be seen by EP.  She may ultimately need AV nodal ablation with pacemaker implant

## 2024-04-02 NOTE — Assessment & Plan Note (Signed)
 She continues to smoke.  Given her shortness of breath and wheezing, she may be experiencing a COPD exacerbation.  As noted, she will be transported to the emergency room for admission by internal medicine.

## 2024-04-02 NOTE — Patient Instructions (Addendum)
 We are sending you to the Emergency Department.  We hope you feel better very soon!!

## 2024-04-03 DIAGNOSIS — R0602 Shortness of breath: Secondary | ICD-10-CM | POA: Diagnosis not present

## 2024-04-03 DIAGNOSIS — E119 Type 2 diabetes mellitus without complications: Secondary | ICD-10-CM | POA: Diagnosis not present

## 2024-04-03 DIAGNOSIS — E871 Hypo-osmolality and hyponatremia: Secondary | ICD-10-CM | POA: Diagnosis not present

## 2024-04-03 DIAGNOSIS — I4891 Unspecified atrial fibrillation: Secondary | ICD-10-CM | POA: Diagnosis not present

## 2024-04-03 LAB — CBC
HCT: 38.3 % (ref 36.0–46.0)
Hemoglobin: 11.5 g/dL — ABNORMAL LOW (ref 12.0–15.0)
MCH: 22.9 pg — ABNORMAL LOW (ref 26.0–34.0)
MCHC: 30 g/dL (ref 30.0–36.0)
MCV: 76.1 fL — ABNORMAL LOW (ref 80.0–100.0)
Platelets: 361 K/uL (ref 150–400)
RBC: 5.03 MIL/uL (ref 3.87–5.11)
RDW: 19.8 % — ABNORMAL HIGH (ref 11.5–15.5)
WBC: 9.2 K/uL (ref 4.0–10.5)
nRBC: 0 % (ref 0.0–0.2)

## 2024-04-03 LAB — GLUCOSE, CAPILLARY
Glucose-Capillary: 206 mg/dL — ABNORMAL HIGH (ref 70–99)
Glucose-Capillary: 145 mg/dL — ABNORMAL HIGH (ref 70–99)
Glucose-Capillary: 213 mg/dL — ABNORMAL HIGH (ref 70–99)

## 2024-04-03 LAB — BASIC METABOLIC PANEL WITH GFR
Anion gap: 17 — ABNORMAL HIGH (ref 5–15)
BUN: 19 mg/dL (ref 8–23)
CO2: 20 mmol/L — ABNORMAL LOW (ref 22–32)
Calcium: 8.6 mg/dL — ABNORMAL LOW (ref 8.9–10.3)
Chloride: 95 mmol/L — ABNORMAL LOW (ref 98–111)
Creatinine, Ser: 1.25 mg/dL — ABNORMAL HIGH (ref 0.44–1.00)
GFR, Estimated: 45 mL/min — ABNORMAL LOW (ref >60)
Glucose, Bld: 183 mg/dL — ABNORMAL HIGH (ref 70–99)
Potassium: 4.1 mmol/L (ref 3.5–5.1)
Sodium: 132 mmol/L — ABNORMAL LOW (ref 135–145)

## 2024-04-03 LAB — CBG MONITORING, ED: Glucose-Capillary: 267 mg/dL — ABNORMAL HIGH (ref 70–99)

## 2024-04-03 LAB — MAGNESIUM: Magnesium: 2 mg/dL (ref 1.7–2.4)

## 2024-04-03 MED ORDER — DIAZEPAM 5 MG/ML IJ SOLN
2.5000 mg | Freq: Once | INTRAMUSCULAR | Status: AC
  Administered 2024-04-03: 2.5 mg via INTRAVENOUS
  Filled 2024-04-03: qty 2

## 2024-04-03 MED ORDER — HYDROXYZINE HCL 25 MG PO TABS
25.0000 mg | ORAL_TABLET | Freq: Three times a day (TID) | ORAL | Status: DC | PRN
  Administered 2024-04-03 – 2024-04-05 (×5): 25 mg via ORAL
  Filled 2024-04-03 (×5): qty 1

## 2024-04-03 MED ORDER — DIAZEPAM 5 MG PO TABS
5.0000 mg | ORAL_TABLET | Freq: Three times a day (TID) | ORAL | Status: DC | PRN
  Administered 2024-04-04 – 2024-04-05 (×2): 5 mg via ORAL
  Filled 2024-04-03 (×2): qty 1

## 2024-04-03 MED ORDER — DIAZEPAM 5 MG PO TABS
5.0000 mg | ORAL_TABLET | Freq: Once | ORAL | Status: AC
  Administered 2024-04-03: 5 mg via ORAL
  Filled 2024-04-03: qty 1

## 2024-04-03 MED ORDER — DILTIAZEM HCL ER COATED BEADS 180 MG PO CP24
360.0000 mg | ORAL_CAPSULE | Freq: Every day | ORAL | Status: DC
  Administered 2024-04-03 – 2024-04-05 (×3): 360 mg via ORAL
  Filled 2024-04-03 (×3): qty 2

## 2024-04-03 MED ORDER — METOPROLOL SUCCINATE ER 100 MG PO TB24
100.0000 mg | ORAL_TABLET | Freq: Every day | ORAL | Status: DC
  Administered 2024-04-03 – 2024-04-05 (×3): 100 mg via ORAL
  Filled 2024-04-03 (×3): qty 1

## 2024-04-03 MED ORDER — KETOROLAC TROMETHAMINE 15 MG/ML IJ SOLN
15.0000 mg | Freq: Once | INTRAMUSCULAR | Status: AC
  Administered 2024-04-03: 15 mg via INTRAVENOUS
  Filled 2024-04-03: qty 1

## 2024-04-03 NOTE — Progress Notes (Signed)
 PROGRESS NOTE    AVNOOR KOURY  FMW:[id number] DOB: [birth year] DOA: 04/02/2024 PCP: Regino Slater, MD    Brief Narrative:  Keerthana Vanrossum  is a 78 y.o. female, with the past medical history of atrial fibrillation, aortic stenosis, pulmonary hypertension,hypertension, hyperlipidemia, CKD3a, HFpEF, rheumatoid arthritis, anxiety/depression/BPD, COPD, glaucoma, migraine headaches, mild cognitive impairment, type 2 diabetes, and chronic back pain . - Patient was sent from cardiology office for evaluation for A-fib with RVR, and dyspnea.  Patient with recent hospitalization, for A-fib with RVR, and chest pain, discharged 02/20/2024, she did follow-up with her cardiology office today, where she was noted to be in A-fib with RVR, when she was instructed to come to ED for further evaluation, she was transported to ED via EMS, she does complaints of shortness of breath, and chest pain, reports symptoms started yesterday, he does report dry cough, wheezing, as well palpitation and racing heart rate, she has some mild lower extremity edema as well, denies fever or chills - In ED she was noted to be in A-fib with RVR, heart rate on EKG showing 131, blood work significant for hypokalemia potassium of 3.3, BNP elevated at 169 (recent BNP was 376 last month), opponents mildly elevated at 25, no acute finding on chest x-ray, was started on IV steroids and Cardizem  drip in ED, and [facility] requested to admit.  Assessment and Plan: Permanent A-fib with RVR -Heart rate uncontrolled, in the 130s -  Eliquis  for anticoagulation. -Per cards: Patient has FIRMLY declined PPM, not interested in any attempts for rhythm control (meds or otherwise >> not likely to be successful) Will resume her PO dilt and home metoprolol    COPD exacerbation - She is with significant wheezing, dyspnea presentation, she reports cough, with productive phlegm. - Admitted under COPD pathway, will continue with Xopenex /ipratropium  scheduled, will avoid albuterol  due to A-fib with RVR. - Will start on scheduled inhaled steroid Pulmicort , she can be transitioned to Trelegy on discharge - He has been tried on Advair and Trelegy in the past without much improvement but she did not follow-up , she will need to follow with her pulmonary on discharge, will need ambulatory referral to pulmonary on discharge.  -wean O2 to RA  Hypokalemia -Replaced   Chest pain -Not typical, related to COPD, recent nuclear stress test no evidence of ischemia or infarction, troponins mildly elevated in the setting of A-fib with RVR, and COPD exacerbation     Heart failure with improved ejection fraction -Received IV Lasix  in ED, at this point appears to be euvolemic, will continue with home dose Lasix . -Echocardiogram from 09/2023 with RVSP 45.9 mmHg, LA and RA with mild dilatation, mild mitral valve stenosis, moderate aortic valve stenosis.      CKD stage 3a, GFR 45-59 ml/min (HCC) -At baseline, avoid nephrotoxic medications   Type 2 diabetes mellitus with hyperlipidemia (HCC) -Keep on insulin  sliding scale during hospital stay as she is on steroids.   Tobacco abuse -She was counseled, will start on nicotine  patch     DVT prophylaxis:  apixaban  (ELIQUIS ) tablet 5 mg    Code Status: Limited: Do not attempt resuscitation (DNR) -DNR-LIMITED -Do Not Intubate/DNI  Family Communication:   Disposition Plan:  Level of care: Telemetry Cardiac Status is: Observation     Consultants:  Cardiology  Subjective: Complains of anxiety-takes Valium  from a friend at home  Objective: Vitals:   04/03/24 1213 04/03/24 1215 04/03/24 1230 04/03/24 1400  BP:   (!) 153/119 (!) 140/100  Pulse:  ROLLEN)  53 (!) 117 (!) 123  Resp:  (!) 23 (!) 29 20  Temp:    98.3 F (36.8 C)  TempSrc:    Oral  SpO2:  99% 99% 100%  Weight:   (P) 74.4 kg   Height: 5' 5 (1.651 m)       Intake/Output Summary (Last 24 hours) at 04/03/2024 1424 Last data filed at  04/03/2024 1046 Gross per 24 hour  Intake 252.78 ml  Output --  Net 252.78 ml   Filed Weights   04/03/24 1230  Weight: (P) 74.4 kg    Examination:   General: Appearance:     Overweight female in no acute distress     Lungs:     respirations unlabored  Heart:    Tachycardic. irregular     MS:   All extremities are intact.    Neurologic:   Awake, alert       Data Reviewed: I have personally reviewed following labs and imaging studies  CBC: Recent Labs  Lab 04/02/24 1249 04/03/24 0407  WBC 9.5 9.2  NEUTROABS 6.7  --   HGB 11.5* 11.5*  HCT 39.0 38.3  MCV 76.6* 76.1*  PLT 319 361   Basic Metabolic Panel: Recent Labs  Lab 04/02/24 1249 04/03/24 0407  NA 135 132*  K 3.3* 4.1  CL 98 95*  CO2 20* 20*  GLUCOSE 124* 183*  BUN 10 19  CREATININE 0.95 1.25*  CALCIUM  8.6* 8.6*  MG 2.0 2.0   GFR: Estimated Creatinine Clearance: 38.3 mL/min (A) (by C-G formula based on SCr of 1.25 mg/dL (H)). Liver Function Tests: No results for input(s): AST, ALT, ALKPHOS, BILITOT, PROT, ALBUMIN in the last 168 hours. No results for input(s): LIPASE, AMYLASE in the last 168 hours. No results for input(s): AMMONIA in the last 168 hours. Coagulation Profile: No results for input(s): INR, PROTIME in the last 168 hours. Cardiac Enzymes: No results for input(s): CKTOTAL, CKMB, CKMBINDEX, TROPONINI in the last 168 hours. BNP (last 3 results) No results for input(s): PROBNP in the last 8760 hours. HbA1C: No results for input(s): HGBA1C in the last 72 hours. CBG: Recent Labs  Lab 04/02/24 1756 04/02/24 2138 04/03/24 0904  GLUCAP 176* 155* 267*   Lipid Profile: No results for input(s): CHOL, HDL, LDLCALC, TRIG, CHOLHDL, LDLDIRECT in the last 72 hours. Thyroid  Function Tests: Recent Labs    04/02/24 1436  TSH 0.839   Anemia Panel: No results for input(s): VITAMINB12, FOLATE, FERRITIN, TIBC, IRON, RETICCTPCT in the  last 72 hours. Sepsis Labs: No results for input(s): PROCALCITON, LATICACIDVEN in the last 168 hours.  Recent Results (from the past 240 hours)  Resp panel by RT-PCR (RSV, Flu A&B, Covid) Anterior Nasal Swab     Status: None   Collection Time: 04/02/24 12:49 PM   Specimen: Anterior Nasal Swab  Result Value Ref Range Status   SARS Coronavirus 2 by RT PCR NEGATIVE NEGATIVE Final   Influenza A by PCR NEGATIVE NEGATIVE Final   Influenza B by PCR NEGATIVE NEGATIVE Final    Comment: (NOTE) The Xpert Xpress SARS-CoV-2/FLU/RSV plus assay is intended as an aid in the diagnosis of influenza from Nasopharyngeal swab specimens and should not be used as a sole basis for treatment. Nasal washings and aspirates are unacceptable for Xpert Xpress SARS-CoV-2/FLU/RSV testing.  Fact Sheet for Patients: BloggerCourse.com  Fact Sheet for Healthcare Providers: SeriousBroker.it  This test is not yet approved or cleared by the [place]  FDA and has been authorized for detection  and/or diagnosis of SARS-CoV-2 by FDA under an Emergency Use Authorization (EUA). This EUA will remain in effect (meaning this test can be used) for the duration of the COVID-19 declaration under Section 564(b)(1) of the Act, 21 U.S.C. section 360bbb-3(b)(1), unless the authorization is terminated or revoked.     Resp Syncytial Virus by PCR NEGATIVE NEGATIVE Final    Comment: (NOTE) Fact Sheet for Patients: BloggerCourse.com  Fact Sheet for Healthcare Providers: SeriousBroker.it  This test is not yet approved or cleared by the [place]  FDA and has been authorized for detection and/or diagnosis of SARS-CoV-2 by FDA under an Emergency Use Authorization (EUA). This EUA will remain in effect (meaning this test can be used) for the duration of the COVID-19 declaration under Section 564(b)(1) of the Act, 21  U.S.C. section 360bbb-3(b)(1), unless the authorization is terminated or revoked.  Performed at [facility], [address], [place], [place] [address]          Radiology Studies: DG Chest Port 1 View Result Date: 04/02/2024 CLINICAL DATA:  cough EXAM: PORTABLE CHEST - 1 VIEW COMPARISON:  02/14/2024 FINDINGS: Chronic coarsening of the pulmonary interstitium without overt pulmonary edema. No focal airspace consolidation, pleural effusion, or pneumothorax. No cardiomegaly. Tortuous aorta with aortic atherosclerosis. No acute fracture or destructive lesions. Multilevel thoracic osteophytosis. IMPRESSION: No acute cardiopulmonary abnormality. Electronically Signed   By: Rogelia Myers M.D.   On: 04/02/2024 13:45        Scheduled Meds:  apixaban   5 mg Oral BID   budesonide  (PULMICORT ) nebulizer solution  2 mg Nebulization Q12H   diltiazem   360 mg Oral Daily   empagliflozin   25 mg Oral Daily   furosemide   20 mg Oral Daily   insulin  aspart  0-15 Units Subcutaneous TID WC   insulin  aspart  0-5 Units Subcutaneous QHS   ipratropium  0.5 mg Nebulization Q6H   levalbuterol   0.63 mg Nebulization Q6H   metoprolol  succinate  100 mg Oral Daily   pantoprazole   40 mg Oral Daily   pravastatin   40 mg Oral q1800   [START ON 04/04/2024] predniSONE   40 mg Oral Q breakfast   Continuous Infusions:  cefTRIAXone  (ROCEPHIN )  IV Stopped (04/02/24 1852)     LOS: 0 days    Time spent: 45 minutes spent on chart review, discussion with nursing staff, consultants, updating family and interview/physical exam; more than 50% of that time was spent in counseling and/or coordination of care.    Harlene RAYMOND Bowl, DO [facility] Available via Epic secure chat 7am-7pm After these hours, please refer to coverage provider listed on [email or web address] 04/03/2024, 2:24 PM

## 2024-04-03 NOTE — Progress Notes (Signed)
 Rounding Note   Patient Name: Theresa Mendoza Date of Encounter: 04/03/2024  [facility] Cardiologist: Wilbert Bihari, MD   Subjective  No cardiac awareness, no CP, + cough, some SOB, wants to go home  Scheduled Meds:  apixaban   5 mg Oral BID   budesonide  (PULMICORT ) nebulizer solution  2 mg Nebulization Q12H   diltiazem   15 mg Intravenous Once   empagliflozin   25 mg Oral Daily   furosemide   20 mg Oral Daily   insulin  aspart  0-15 Units Subcutaneous TID WC   insulin  aspart  0-5 Units Subcutaneous QHS   ipratropium  0.5 mg Nebulization Q6H   levalbuterol   0.63 mg Nebulization Q6H   methylPREDNISolone  (SOLU-MEDROL ) injection  125 mg Intravenous Q24H   Followed by   NOREEN ON 04/04/2024] predniSONE   40 mg Oral Q breakfast   pantoprazole   40 mg Oral Daily   pravastatin   40 mg Oral q1800   Continuous Infusions:  cefTRIAXone  (ROCEPHIN )  IV Stopped (04/02/24 1852)   diltiazem  (CARDIZEM ) infusion 15 mg/hr (04/02/24 1938)   PRN Meds: acetaminophen  **OR** acetaminophen , trimethobenzamide    Vital Signs  Vitals:   04/02/24 2345 04/03/24 0000 04/03/24 0005 04/03/24 0146  BP: 119/61 (!) 152/124  126/84  Pulse: (!) 130  (!) 120 (!) 127  Resp: (!) 30 (!) 24 19 18   Temp:    98.9 F (37.2 C)  TempSrc:      SpO2: 100% 100% 100% 100%   No intake or output data in the 24 hours ending 04/03/24 1006    04/02/2024    9:36 AM 03/26/2024   12:56 PM 02/20/2024    1:04 AM  Last 3 Weights  Weight (lbs) 160 lb 12.8 oz 159 lb 152 lb 12.5 oz  Weight (kg) 72.938 kg 72.122 kg 69.3 kg      Telemetry  AFib 110's-120's >> 150's - Personally Reviewed  ECG   AFib 121, RBBB - Personally Reviewed  Physical Exam  Seen/examined by Dr. Almetta this morning, largely unchanged, though anxious GEN: No acute distress, anxious.   Neck: No JVD Cardiac: irreg-irreg, tachycardic, no murmurs, rubs, or gallops.  Respiratory: wheezing remains GI: Soft, nontender, non-distended  MS: No  edema; No deformity. Neuro:  Nonfocal  Psych: Normal affect   Labs High Sensitivity Troponin:   Recent Labs  Lab 04/02/24 1249 04/02/24 1436  TROPONINIHS 25* 23*     Chemistry Recent Labs  Lab 04/02/24 1249 04/03/24 0407  NA 135 132*  K 3.3* 4.1  CL 98 95*  CO2 20* 20*  GLUCOSE 124* 183*  BUN 10 19  CREATININE 0.95 1.25*  CALCIUM  8.6* 8.6*  MG 2.0 2.0  GFRNONAA >60 45*  ANIONGAP 17* 17*    Lipids No results for input(s): CHOL, TRIG, HDL, LABVLDL, LDLCALC, CHOLHDL in the last 168 hours.  Hematology Recent Labs  Lab 04/02/24 1249 04/03/24 0407  WBC 9.5 9.2  RBC 5.09 5.03  HGB 11.5* 11.5*  HCT 39.0 38.3  MCV 76.6* 76.1*  MCH 22.6* 22.9*  MCHC 29.5* 30.0  RDW 19.4* 19.8*  PLT 319 361   Thyroid   Recent Labs  Lab 04/02/24 1436  TSH 0.839    BNP Recent Labs  Lab 04/02/24 1249  BNP 169.0*    DDimer No results for input(s): DDIMER in the last 168 hours.   Radiology  DG Chest Port 1 View Result Date: 04/02/2024 CLINICAL DATA:  cough EXAM: PORTABLE CHEST - 1 VIEW COMPARISON:  02/14/2024 FINDINGS: Chronic coarsening of the  pulmonary interstitium without overt pulmonary edema. No focal airspace consolidation, pleural effusion, or pneumothorax. No cardiomegaly. Tortuous aorta with aortic atherosclerosis. No acute fracture or destructive lesions. Multilevel thoracic osteophytosis. IMPRESSION: No acute cardiopulmonary abnormality. Electronically Signed   By: Rogelia Myers M.D.   On: 04/02/2024 13:45    Cardiac Studies  MPI 02/01/16: no ischemia, EF 65 MPI 02/19/2024: No ischemia or infarction, EF 50, low risk TTE 10/03/2020: EF 40-45, mild MR, mild-moderate aortic stenosis (mean gradient 14.5, V-max 246 cm/s, DI 0.44) TTE 09/24/23: EF 60-65, no RWMA, NL RVSF, mod pulmonary HTN, RVSP 45.9, mild BAE, no MR, mild MS (mean MV gradient 6 mmHg), severe AV Ca2+, mild AI, mod AS (mean AV gradient 33 mmHg, Vmax 357.6 cm/s, DI 0.48)  Patient Profile   78 y.o.  female w/PMHx of: COPD (ongoing smoker), p.HTN, HLD, RA CAD (non-obstructive by record) >> MPI 02/19/2024: No ischemia or infarction, EF 50, low risk  AFib (desribed as permanent) VHD (mild MR, mod AS RBBB  Admitted with COPD exacerbation and Afib w/RVR  Assessment & Plan   Permanent AFib CHA2DS2Vasc is 4, on Eliquis  Patient has FIRMLY declined PPM, not interested in any attempts for rhythm control (meds or otherwise >> not likely to be successful) Will resume her PO dilt and home metoprolol   Hopefully with better anxiety/pulmonary management her HRs will settle down some  For questions or updates, please contact [facility] Please consult [email or web address] for contact info under    Signed, Charlies Macario Arthur, PA-C  04/03/2024, 10:06 AM

## 2024-04-03 NOTE — ED Notes (Signed)
 Pt continues to call out to staff asking for more anxiety medications, pt unable to sleep or relax. MD notified.

## 2024-04-03 NOTE — Care Management Obs Status (Signed)
 MEDICARE OBSERVATION STATUS NOTIFICATION   Patient Details  Name: Theresa Mendoza MRN: [id number] Date of Birth: September 09, 1945   Medicare Observation Status Notification Given:  Yes    Vonzell Arrie Sharps 04/03/2024, 2:58 PM

## 2024-04-03 NOTE — Care Management Obs Status (Signed)
 MEDICARE OBSERVATION STATUS NOTIFICATION   Patient Details  Name: Theresa Mendoza MRN: [id number] Date of Birth: February 23, 1946   Medicare Observation Status Notification Given:  Yes    Vonzell Arrie Sharps 04/03/2024, 3:13 PM

## 2024-04-03 NOTE — Evaluation (Signed)
 Physical Therapy Evaluation Patient Details Name: Theresa Mendoza MRN: [id number] DOB: 1945-11-28 Today's Date: 04/03/2024  History of Present Illness  Theresa Mendoza is a 78 y.o. female who presented to [facility] ED 04/02/24 from cardiology office for evaluation for A-fib with RVR and dyspnea. PMHx: A-Fib, aortic stenosis, pulmonary hypertension, HTN, HLD, CKD3a, HFpEF, RA, anxiety/depression/BPD, COPD, glaucoma, migraine headaches, mild cognitive impairment, T2DM, and chronic back pain.   Clinical Impression  Pt admitted with above diagnosis. PTA, pt was independent with functional mobility and ADLs. She required assist for IADLs and transportation. Pt lives with her son and daughter-in-law in a two story house with a level entry. Her bedroom and full bathroom are located upstairs. Pt currently with functional limitations due to the deficits listed below (see PT Problem List). She performed transfers with supervision, gait using IV pole for support with CGA, and standing marches with IV pole for support with CGA. Pt tachycardic throughout mobility, facilitated standing rest breaks and cued PLB technique for pt's HR to recover into the 120-130s. Her HRmax during gait was 155bpm. Pt will benefit from acute skilled PT to increase her independence and safety with mobility to allow discharge. Recommend HHPT to increase activity tolerance, improve balance, decrease fall risk, and optimize safety within the home environment.     If plan is discharge home, recommend the following: A little help with walking and/or transfers;Assistance with cooking/housework;Assist for transportation;Help with stairs or ramp for entrance   Can travel by private vehicle        Equipment Recommendations None recommended by PT (Pt already has DME)  Recommendations for Other Services       Functional Status Assessment Patient has had a recent decline in their functional status and demonstrates the ability to make  significant improvements in function in a reasonable and predictable amount of time.     Precautions / Restrictions Precautions Precautions: Fall Recall of Precautions/Restrictions: Intact Restrictions Weight Bearing Restrictions Per Provider Order: No      Mobility  Bed Mobility               General bed mobility comments: Not assessed. Pt greeted seated on BSC in room. She returned to sit on EOB at end of session.    Transfers Overall transfer level: Needs assistance Equipment used: None Transfers: Sit to/from Stand, Bed to chair/wheelchair/BSC Sit to Stand: Supervision   Step pivot transfers: Supervision       General transfer comment: Pt stood from [facility] by pushing up with BUE support from arm rests. No physical assist. She transferred back to bed slowly. Pt managed lines/leads.    Ambulation/Gait Ambulation/Gait assistance: Contact guard assist Gait Distance (Feet): 250 Feet Assistive device: None, IV Pole Gait Pattern/deviations: Step-through pattern, Decreased stride length, Drifts right/left Gait velocity: reduced Gait velocity interpretation: <1.8 ft/sec, indicate of risk for recurrent falls   General Gait Details: Pt ambulated with a reciprocal gait pattern, even weight shift, and good foot clearence. She was unsteady with postural sway. Pt declined to trail RW. She utilized IV pole to steady herself pushing with RUE. Discussed using AD for increased support/stability. Pt reports she has a SPC at home that she can use. Intermittent standing rest breaks faciliated d/t SOB and tachycardia. Cued PLB technique.  Stairs Stairs: Yes Stairs assistance: Contact guard assist Stair Management:  (with IV pole) Number of Stairs: 10 General stair comments: Pt performed standing marches with unilateral UE support on IV pole to simulate railing in house. She was able  to achieve great clearence with good hip/knee ext to clear a step without issue. Discussed how her family  should be positioned to support her at home.  Wheelchair Mobility     Tilt Bed    Modified Rankin (Stroke Patients Only)       Balance Overall balance assessment: Needs assistance Sitting-balance support: No upper extremity supported, Feet supported Sitting balance-Leahy Scale: Good     Standing balance support: Single extremity supported, During functional activity Standing balance-Leahy Scale: Fair Standing balance comment: Pt unsteady during gait.                             Pertinent Vitals/Pain Pain Assessment Pain Assessment: 0-10 Pain Score: 7  Pain Location: Back Pain Descriptors / Indicators: Stabbing, Discomfort, Shooting    Home Living Family/patient expects to be discharged to:: Private residence Living Arrangements: Children;Other relatives (Son & Daughter-in-Law) Available Help at Discharge: Family;Available PRN/intermittently;Available 24 hours/day (Daughter-in-Law is home most the time; Son works, but can be from home) Type of Home: House Home Access: Level entry     Alternate Level Stairs-Number of Steps: 8 Home Layout: Two level;Bed/bath upstairs;1/2 bath on main level Home Equipment: Shower seat;Wheelchair - Forensic psychologist (2 wheels);Hand held shower head;Grab bars - tub/shower;Grab bars - toilet;Cane - single point      Prior Function Prior Level of Function : Independent/Modified Independent             Mobility Comments: Indep without AD. 3 falls in the past 1mo. ADLs Comments: Indep with ADLs. Son manages her medications and household chores. Relies on family for transportation.     Extremity/Trunk Assessment   Upper Extremity Assessment Upper Extremity Assessment: Defer to OT evaluation    Lower Extremity Assessment Lower Extremity Assessment: Overall WFL for tasks assessed       Communication   Communication Communication: No apparent difficulties    Cognition Arousal: Alert Behavior During Therapy: WFL  for tasks assessed/performed   PT - Cognitive impairments: No family/caregiver present to determine baseline                       PT - Cognition Comments: Pt A,Ox4 Following commands: Intact       Cueing Cueing Techniques: Verbal cues     General Comments General comments (skin integrity, edema, etc.): Pt greeted on 2L O2 via [place] and SpO2 100%. Weaned to RA with SpO2 >93% throughout session. Pt tachycardic. At rest 110s. During activity she reached a HR max of 155bpm, cued PLB with pt able to recover into the 130s.    Exercises     Assessment/Plan    PT Assessment Patient needs continued PT services  PT Problem List Cardiopulmonary status limiting activity;Decreased activity tolerance;Decreased balance;Decreased mobility;Decreased knowledge of use of DME       PT Treatment Interventions DME instruction;Gait training;Stair training;Functional mobility training;Therapeutic activities;Therapeutic exercise;Balance training;Patient/family education    PT Goals (Current goals can be found in the Care Plan section)  Acute Rehab PT Goals Patient Stated Goal: Return Home today PT Goal Formulation: With patient Time For Goal Achievement: 04/17/24 Potential to Achieve Goals: Good    Frequency Min 2X/week     Co-evaluation               AM-PAC PT 6 Clicks Mobility  Outcome Measure Help needed turning from your back to your side while in a flat bed without using bedrails?: A Little  Help needed moving from lying on your back to sitting on the side of a flat bed without using bedrails?: A Little Help needed moving to and from a bed to a chair (including a wheelchair)?: A Little Help needed standing up from a chair using your arms (e.g., wheelchair or bedside chair)?: A Little Help needed to walk in hospital room?: A Little Help needed climbing 3-5 steps with a railing? : A Little 6 Click Score: 18    End of Session Equipment Utilized During Treatment: Gait  belt Activity Tolerance: Patient tolerated treatment well Patient left: in bed;with call bell/phone within reach Nurse Communication: Mobility status PT Visit Diagnosis: Unsteadiness on feet (R26.81)    Time: 0830-0900 PT Time Calculation (min) (ACUTE ONLY): 30 min   Charges:   PT Evaluation $PT Eval Low Complexity: 1 Low PT Treatments $Gait Training: 8-22 mins PT General Charges $$ ACUTE PT VISIT: 1 Visit         Randall SAUNDERS, PT, DPT Acute Rehabilitation Services Office: [phone] Secure Chat Preferred  Delon CHRISTELLA Callander 04/03/2024, 9:27 AM

## 2024-04-03 NOTE — ED Notes (Signed)
 Pt attempted to use bedside commode when she called out with the call light that she had spilled her urine basin on the floor. Pt was escorted to the bathroom and gown was changed, room was cleaned up.

## 2024-04-03 NOTE — TOC Initial Note (Signed)
 Transition of Care [facility]) - Initial/Assessment Note    Patient Details  Name: Theresa Mendoza MRN: [id number] Date of Birth: 02-Feb-1946  Transition of Care [facility]) CM/SW Contact:    Sudie Erminio Deems, RN Phone Number: 04/03/2024, 4:27 PM  Clinical Narrative:  Patient presented for chest pain. PTA patient was from home with son and his wife. Patient has DME: rolling walker and oxygen -(has not worn in 3 years). Son is at the bedside and he requests that the staff call him with pertinent information. Staff Water engineer are aware. Inpatient Case Manager spoke with patient and son and both agreeable for [facility] PT/OT. Patient has used [facility] in the past and wants to use them again. Referral submitted and start of care to begin within 24-48 hours post transition home. No further needs identified at this time.                Expected Discharge Plan: Home w Home Health Services Barriers to Discharge: No Barriers Identified   Patient Goals and CMS Choice Patient states their goals for this hospitalization and ongoing recovery are:: plan to return home.   Choice offered to / list presented to : NA      Expected Discharge Plan and Services In-house Referral: NA Discharge Planning Services: CM Consult Post Acute Care Choice: NA Living arrangements for the past 2 months: Single Family Home                   DME Agency: NA                  Prior Living Arrangements/Services Living arrangements for the past 2 months: Single Family Home Lives with:: Adult Children Patient language and need for interpreter reviewed:: Yes Do you feel safe going back to the place where you live?: Yes      Need for Family Participation in Patient Care: Yes (Comment) Care giver support system in place?: Yes (comment) Current home services: DME (walker and oxygen  via [employer]- per son has not worn in the last 3 years) Criminal Activity/Legal Involvement Pertinent to Current Situation/Hospitalization:  No - Comment as needed  Activities of Daily Living   ADL Screening (condition at time of admission) Independently performs ADLs?: Yes (appropriate for developmental age) Is the patient deaf or have difficulty hearing?: Yes Does the patient have difficulty seeing, even when wearing glasses/contacts?: No Does the patient have difficulty concentrating, remembering, or making decisions?: No  Permission Sought/Granted Permission sought to share information with : Family Supports, Magazine features editor, Case Manager   Emotional Assessment Appearance:: Appears stated age Attitude/Demeanor/Rapport: Engaged Affect (typically observed): Appropriate Orientation: : Oriented to Self Alcohol / Substance Use: Not Applicable Psych Involvement: No (comment)  Admission diagnosis:  SOB (shortness of breath) [R06.02] Atrial fibrillation with RVR (HCC) [I48.91] Chest pain, unspecified type [R07.9] Patient Active Problem List   Diagnosis Date Noted   Atrial fibrillation with RVR (HCC) 04/02/2024   CKD stage 3a, GFR 45-59 ml/min (HCC) 02/20/2024   Aortic valve disease 02/18/2024   Type 2 diabetes mellitus with hyperlipidemia (HCC) 02/18/2024   Benign hypertension 02/18/2024   Aortic stenosis 11/06/2023   Stage 3a chronic kidney disease (HCC) 11/06/2023   Chest pain 09/22/2023   Diabetes mellitus type II, non insulin  dependent (HCC) 10/14/2021   COPD exacerbation (HCC) 10/14/2021   Acute exacerbation of chronic obstructive pulmonary disease (COPD) (HCC) 10/13/2021   Chronic combined systolic and diastolic CHF (congestive heart failure) (HCC) 10/13/2021   Depression  with anxiety 10/13/2021   Chronic back pain 10/13/2021   Pain due to onychomycosis of toenails of both feet 10/20/2020   Coagulation disorder (HCC) 10/20/2020   Chronic CHF (congestive heart failure) (HCC)    Essential hypertension    Major depressive disorder, recurrent episode, moderate (HCC) 10/09/2020   Heart failure  with improved ejection fraction (HFimpEF) (HCC) 10/07/2020   Encounter for tobacco use cessation counseling 12/04/2017   Chronic respiratory failure with hypoxia (HCC) 06/18/2017   COPD (chronic obstructive pulmonary disease) (HCC) 08/24/2016   Chronic bronchitis (HCC) 08/24/2016   Hypersomnia 08/24/2016   Hyperlipidemia 12/25/2015   CAD (coronary artery disease), native coronary artery 12/24/2015   Old MI (myocardial infarction) 12/24/2015   Permanent atrial fibrillation (HCC) 12/24/2015   Cognitive and behavioral changes 12/15/2015   Tobacco use disorder 12/15/2015   PCP:  Regino Slater, MD Pharmacy:   [facility] [phone] [address] [place], [place] - [address] [place] [address] Phone: [phone] Fax: [phone]  [id number] - [place], [place] - [address] [place] [address] Phone: [phone] Fax: [phone]  Social Drivers of Health (SDOH) Social History: SDOH Screenings   Food Insecurity: No Food Insecurity (04/03/2024)  Housing: Low Risk  (04/03/2024)  Transportation Needs: No Transportation Needs (04/03/2024)  Utilities: Not At Risk (04/03/2024)  Alcohol Screen: Low Risk  (10/13/2020)  Depression (PHQ2-9): Low Risk  (01/24/2024)  Financial Resource Strain: Low Risk  (01/24/2024)  Physical Activity: Inactive (01/24/2024)  Social Connections: Socially Isolated (04/03/2024)  Tobacco Use: High Risk (04/02/2024)   SDOH Interventions:     Readmission Risk Interventions    02/18/2024    5:56 PM 09/24/2023   12:34 PM  Readmission Risk Prevention Plan  Post Dischage Appt  Complete  Medication Screening  Complete  Transportation Screening Complete Complete  HRI or Home Care Consult Complete   Palliative Care Screening Not Applicable   Medication Review (RN Care Manager) Complete

## 2024-04-03 NOTE — ED Notes (Signed)
 Pt restless, unable to go to sleep. Asking for anxiety medications. Pt has been informed she has been given 2 separate doses of Valium  per request to MD. Pt still endorsing anxiety and insomnia. MD notified and aware

## 2024-04-03 NOTE — Evaluation (Signed)
 Occupational Therapy Evaluation Patient Details Name: Theresa Mendoza MRN: [id number] DOB: [birth year] Today's Date: 04/03/2024   History of Present Illness   Theresa Mendoza is a 78 y.o. female who presented to [facility] ED 04/02/24 from cardiology office for evaluation for A-fib with RVR and dyspnea. PMHx: A-Fib, aortic stenosis, pulmonary hypertension, HTN, HLD, CKD3a, HFpEF, RA, anxiety/depression/BPD, COPD, glaucoma, migraine headaches, mild cognitive impairment, T2DM, and chronic back pain .     Clinical Impressions Theresa Mendoza was evaluated s/p the above admission list. She is indep at baseline but reports 3 recent falls. Upon evaluation the pt was limited by weakness, elevated HR, mild dizziness, mild agitation (pt states this happens when she is on a steroid), unsteady gait and limited activity tolerance. Overall she transferred and mobilized with superivsion A, she would benefit from AD but declined. Due to the deficits listed below the pt also needs up to supervision A for ADLs with cues for pacing and safety. Pt will benefit from continued acute OT services and HHOT.      If plan is discharge home, recommend the following:   A little help with walking and/or transfers;A little help with bathing/dressing/bathroom;Assistance with cooking/housework;Assist for transportation     Functional Status Assessment   Patient has had a recent decline in their functional status and demonstrates the ability to make significant improvements in function in a reasonable and predictable amount of time.     Equipment Recommendations   None recommended by OT      Precautions/Restrictions   Precautions Precautions: Fall Recall of Precautions/Restrictions: Intact Precaution/Restrictions Comments: HR goal <130 Restrictions Weight Bearing Restrictions Per Provider Order: No     Mobility Bed Mobility Overal bed mobility: Needs Assistance Bed Mobility: Supine to Sit, Sit to Supine      Supine to sit: Supervision Sit to supine: Supervision        Transfers Overall transfer level: Needs assistance Equipment used: None Transfers: Sit to/from Stand, Bed to chair/wheelchair/BSC Sit to Stand: Supervision     Step pivot transfers: Supervision     General transfer comment: HR to 140s when OOB      Balance Overall balance assessment: Needs assistance Sitting-balance support: No upper extremity supported, Feet supported Sitting balance-Leahy Scale: Good     Standing balance support: Single extremity supported, During functional activity Standing balance-Leahy Scale: Fair Standing balance comment: Pt unsteady during gait.                           ADL either performed or assessed with clinical judgement   ADL Overall ADL's : Needs assistance/impaired Eating/Feeding: Independent   Grooming: Set up;Sitting   Upper Body Bathing: Set up;Sitting   Lower Body Bathing: Supervison/ safety;Sit to/from stand   Upper Body Dressing : Set up;Sitting   Lower Body Dressing: Supervision/safety;Sit to/from stand   Toilet Transfer: Supervision/safety   Toileting- Architect and Hygiene: Supervision/safety       Functional mobility during ADLs: Supervision/safety General ADL Comments: limited by HR, dizziness and balance     Vision Baseline Vision/History: 0 No visual deficits Vision Assessment?: No apparent visual deficits     Perception Perception: Not tested       Praxis Praxis: Not tested       Pertinent Vitals/Pain Pain Assessment Pain Location: Back Pain Descriptors / Indicators: Stabbing, Discomfort, Shooting     Extremity/Trunk Assessment Upper Extremity Assessment Upper Extremity Assessment: Generalized weakness   Lower Extremity Assessment Lower Extremity Assessment: Defer  to PT evaluation   Cervical / Trunk Assessment Cervical / Trunk Assessment: Normal   Communication Communication Communication: No apparent  difficulties   Cognition Arousal: Alert Behavior During Therapy: WFL for tasks assessed/performed                                 Following commands: Intact       Cueing  General Comments   Cueing Techniques: Verbal cues  SpO2 stable on RA, HR 140s on BSC, resting in bed was 120s   Exercises     Shoulder Instructions      Home Living Family/patient expects to be discharged to:: Private residence Living Arrangements: Children;Other relatives (Son & Daughter-in-Law) Available Help at Discharge: Family;Available PRN/intermittently;Available 24 hours/day (Daughter-in-Law is home most the time; Son works, but can be from home) Type of Home: House Home Access: Level entry     Home Layout: Two level;Bed/bath upstairs;1/2 bath on main level Alternate Level Stairs-Number of Steps: 8 Alternate Level Stairs-Rails: Left Bathroom Shower/Tub: Producer, television/film/video: Standard     Home Equipment: Shower seat;Wheelchair - Forensic psychologist (2 wheels);Hand held shower head;Grab bars - tub/shower;Grab bars - toilet;Cane - single point          Prior Functioning/Environment Prior Level of Function : Independent/Modified Independent             Mobility Comments: Indep without AD. 3 falls in the past 35mo. ADLs Comments: Indep with ADLs. Son manages her medications and household chores. Relies on family for transportation.    OT Problem List: Decreased strength;Decreased range of motion;Decreased activity tolerance;Impaired balance (sitting and/or standing);Decreased safety awareness;Decreased knowledge of use of DME or AE;Decreased knowledge of precautions   OT Treatment/Interventions: Self-care/ADL training;Therapeutic exercise;DME and/or AE instruction;Energy conservation;Patient/family education;Balance training      OT Goals(Current goals can be found in the care plan section)   Acute Rehab OT Goals Patient Stated Goal: to go home OT Goal  Formulation: With patient Time For Goal Achievement: 04/04/24 Potential to Achieve Goals: Good ADL Goals Pt Will Perform Grooming: with modified independence Pt Will Perform Lower Body Dressing: with modified independence Pt Will Transfer to Toilet: with modified independence Additional ADL Goal #1: Pt will complete at least 10 minutes of OOB activity to demonstrate improved tolerance for ADL/IADLs   OT Frequency:  Min 2X/week    Co-evaluation              AM-PAC OT 6 Clicks Daily Activity     Outcome Measure Help from another person eating meals?: None Help from another person taking care of personal grooming?: A Little Help from another person toileting, which includes using toliet, bedpan, or urinal?: A Little Help from another person bathing (including washing, rinsing, drying)?: A Little Help from another person to put on and taking off regular upper body clothing?: A Little Help from another person to put on and taking off regular lower body clothing?: A Little 6 Click Score: 19   End of Session Nurse Communication: Mobility status  Activity Tolerance: Patient tolerated treatment well Patient left: in bed  OT Visit Diagnosis: Unsteadiness on feet (R26.81);Other abnormalities of gait and mobility (R26.89);Muscle weakness (generalized) (M62.81)                Time: 8889-8874 OT Time Calculation (min): 15 min Charges:  OT General Charges $OT Visit: 1 Visit OT Evaluation $OT Eval Moderate Complexity: 1 Mod  Lucie Kendall, OTR/L Acute Rehabilitation Services Office [phone] Secure Chat Communication Preferred   Lucie JONETTA Kendall 04/03/2024, 11:54 AM

## 2024-04-04 ENCOUNTER — Telehealth: Payer: Self-pay | Admitting: Student in an Organized Health Care Education/Training Program

## 2024-04-04 DIAGNOSIS — I272 Pulmonary hypertension, unspecified: Secondary | ICD-10-CM | POA: Diagnosis not present

## 2024-04-04 DIAGNOSIS — G3184 Mild cognitive impairment, so stated: Secondary | ICD-10-CM | POA: Diagnosis not present

## 2024-04-04 DIAGNOSIS — N179 Acute kidney failure, unspecified: Secondary | ICD-10-CM | POA: Diagnosis not present

## 2024-04-04 DIAGNOSIS — J441 Chronic obstructive pulmonary disease with (acute) exacerbation: Secondary | ICD-10-CM | POA: Diagnosis not present

## 2024-04-04 DIAGNOSIS — Z7984 Long term (current) use of oral hypoglycemic drugs: Secondary | ICD-10-CM | POA: Diagnosis not present

## 2024-04-04 DIAGNOSIS — E1169 Type 2 diabetes mellitus with other specified complication: Secondary | ICD-10-CM | POA: Diagnosis not present

## 2024-04-04 DIAGNOSIS — E785 Hyperlipidemia, unspecified: Secondary | ICD-10-CM | POA: Diagnosis not present

## 2024-04-04 DIAGNOSIS — E876 Hypokalemia: Secondary | ICD-10-CM | POA: Diagnosis not present

## 2024-04-04 DIAGNOSIS — I4891 Unspecified atrial fibrillation: Secondary | ICD-10-CM | POA: Diagnosis not present

## 2024-04-04 DIAGNOSIS — E860 Dehydration: Secondary | ICD-10-CM | POA: Diagnosis not present

## 2024-04-04 DIAGNOSIS — R0602 Shortness of breath: Secondary | ICD-10-CM

## 2024-04-04 DIAGNOSIS — F319 Bipolar disorder, unspecified: Secondary | ICD-10-CM | POA: Diagnosis not present

## 2024-04-04 DIAGNOSIS — I5032 Chronic diastolic (congestive) heart failure: Secondary | ICD-10-CM | POA: Diagnosis not present

## 2024-04-04 DIAGNOSIS — I4811 Longstanding persistent atrial fibrillation: Secondary | ICD-10-CM | POA: Diagnosis not present

## 2024-04-04 DIAGNOSIS — E871 Hypo-osmolality and hyponatremia: Secondary | ICD-10-CM

## 2024-04-04 DIAGNOSIS — F17210 Nicotine dependence, cigarettes, uncomplicated: Secondary | ICD-10-CM | POA: Diagnosis not present

## 2024-04-04 DIAGNOSIS — N1831 Chronic kidney disease, stage 3a: Secondary | ICD-10-CM | POA: Diagnosis not present

## 2024-04-04 DIAGNOSIS — I340 Nonrheumatic mitral (valve) insufficiency: Secondary | ICD-10-CM | POA: Diagnosis not present

## 2024-04-04 DIAGNOSIS — Z66 Do not resuscitate: Secondary | ICD-10-CM | POA: Diagnosis not present

## 2024-04-04 DIAGNOSIS — E872 Acidosis, unspecified: Secondary | ICD-10-CM | POA: Diagnosis not present

## 2024-04-04 DIAGNOSIS — E119 Type 2 diabetes mellitus without complications: Secondary | ICD-10-CM | POA: Diagnosis not present

## 2024-04-04 DIAGNOSIS — Z7901 Long term (current) use of anticoagulants: Secondary | ICD-10-CM | POA: Diagnosis not present

## 2024-04-04 DIAGNOSIS — I130 Hypertensive heart and chronic kidney disease with heart failure and stage 1 through stage 4 chronic kidney disease, or unspecified chronic kidney disease: Secondary | ICD-10-CM | POA: Diagnosis not present

## 2024-04-04 DIAGNOSIS — M069 Rheumatoid arthritis, unspecified: Secondary | ICD-10-CM | POA: Diagnosis not present

## 2024-04-04 DIAGNOSIS — Z1152 Encounter for screening for COVID-19: Secondary | ICD-10-CM | POA: Diagnosis not present

## 2024-04-04 DIAGNOSIS — E1122 Type 2 diabetes mellitus with diabetic chronic kidney disease: Secondary | ICD-10-CM | POA: Diagnosis not present

## 2024-04-04 DIAGNOSIS — I2510 Atherosclerotic heart disease of native coronary artery without angina pectoris: Secondary | ICD-10-CM | POA: Diagnosis not present

## 2024-04-04 LAB — BASIC METABOLIC PANEL WITH GFR
Anion gap: 12 (ref 5–15)
Anion gap: 11 (ref 5–15)
BUN: 43 mg/dL — ABNORMAL HIGH (ref 8–23)
BUN: 41 mg/dL — ABNORMAL HIGH (ref 8–23)
CO2: 19 mmol/L — ABNORMAL LOW (ref 22–32)
CO2: 23 mmol/L (ref 22–32)
Calcium: 8.3 mg/dL — ABNORMAL LOW (ref 8.9–10.3)
Calcium: 8.8 mg/dL — ABNORMAL LOW (ref 8.9–10.3)
Chloride: 93 mmol/L — ABNORMAL LOW (ref 98–111)
Chloride: 93 mmol/L — ABNORMAL LOW (ref 98–111)
Creatinine, Ser: 1.4 mg/dL — ABNORMAL HIGH (ref 0.44–1.00)
Creatinine, Ser: 1.27 mg/dL — ABNORMAL HIGH (ref 0.44–1.00)
GFR, Estimated: 39 mL/min — ABNORMAL LOW (ref >60)
GFR, Estimated: 44 mL/min — ABNORMAL LOW (ref >60)
Glucose, Bld: 184 mg/dL — ABNORMAL HIGH (ref 70–99)
Glucose, Bld: 233 mg/dL — ABNORMAL HIGH (ref 70–99)
Potassium: 4.8 mmol/L (ref 3.5–5.1)
Potassium: 4.6 mmol/L (ref 3.5–5.1)
Sodium: 124 mmol/L — ABNORMAL LOW (ref 135–145)
Sodium: 127 mmol/L — ABNORMAL LOW (ref 135–145)

## 2024-04-04 LAB — GLUCOSE, CAPILLARY
Glucose-Capillary: 171 mg/dL — ABNORMAL HIGH (ref 70–99)
Glucose-Capillary: 288 mg/dL — ABNORMAL HIGH (ref 70–99)
Glucose-Capillary: 133 mg/dL — ABNORMAL HIGH (ref 70–99)
Glucose-Capillary: 132 mg/dL — ABNORMAL HIGH (ref 70–99)

## 2024-04-04 LAB — CBC
HCT: 37.9 % (ref 36.0–46.0)
Hemoglobin: 11.7 g/dL — ABNORMAL LOW (ref 12.0–15.0)
MCH: 22.8 pg — ABNORMAL LOW (ref 26.0–34.0)
MCHC: 30.9 g/dL (ref 30.0–36.0)
MCV: 73.9 fL — ABNORMAL LOW (ref 80.0–100.0)
Platelets: 449 K/uL — ABNORMAL HIGH (ref 150–400)
RBC: 5.13 MIL/uL — ABNORMAL HIGH (ref 3.87–5.11)
RDW: 19.9 % — ABNORMAL HIGH (ref 11.5–15.5)
WBC: 21.1 K/uL — ABNORMAL HIGH (ref 4.0–10.5)
nRBC: 0 % (ref 0.0–0.2)

## 2024-04-04 LAB — OSMOLALITY, URINE: Osmolality, Ur: 241 mosm/kg — ABNORMAL LOW (ref 300–900)

## 2024-04-04 LAB — OSMOLALITY: Osmolality: 299 mosm/kg — ABNORMAL HIGH (ref 275–295)

## 2024-04-04 LAB — SODIUM, URINE, RANDOM: Sodium, Ur: <30 mmol/L

## 2024-04-04 MED ORDER — LEVALBUTEROL HCL 0.63 MG/3ML IN NEBU: 0.6300 mg | INHALATION_SOLUTION | Freq: Four times a day (QID) | RESPIRATORY_TRACT | Status: DC | PRN

## 2024-04-04 MED ORDER — SODIUM CHLORIDE 0.9 % IV SOLN: INTRAVENOUS | Status: DC

## 2024-04-04 MED ORDER — DULOXETINE HCL 60 MG PO CPEP
60.0000 mg | ORAL_CAPSULE | Freq: Every day | ORAL | Status: DC
  Administered 2024-04-04 – 2024-04-05 (×2): 60 mg via ORAL
  Filled 2024-04-04 (×2): qty 1

## 2024-04-04 MED ORDER — INSULIN ASPART 100 UNIT/ML IJ SOLN
3.0000 [IU] | Freq: Three times a day (TID) | INTRAMUSCULAR | Status: DC
  Administered 2024-04-04 – 2024-04-05 (×2): 3 [IU] via SUBCUTANEOUS

## 2024-04-04 MED ORDER — GLUCERNA SHAKE PO LIQD
237.0000 mL | Freq: Every day | ORAL | Status: DC
  Administered 2024-04-05: 237 mL via ORAL

## 2024-04-04 NOTE — Progress Notes (Signed)
 Initial Nutrition Assessment  DOCUMENTATION CODES:   Not applicable  INTERVENTION:  Carb Modified, 1500 ml fluid diet Glucerna Shake po daily, each supplement provides 220 kcal and 10 grams of protein    NUTRITION DIAGNOSIS:   Altered nutrition lab value related to other (see comment) (diabetes) as evidenced by other (comment) (elevated serum glucose/CBG, requirng SSI).    GOAL:   Patient will meet greater than or equal to 90% of their needs   MONITOR:   Labs, PO intake  REASON FOR ASSESSMENT:   Consult Assessment of nutrition requirement/status  ASSESSMENT:   PMH of atrial fibrillation, aortic stenosis, pulmonary hypertension,hypertension, hyperlipidemia, CKD3a, HFpEF, rheumatoid arthritis, anxiety/depression/BPD, COPD, glaucoma, migraine headaches, mild cognitive impairment, type 2 diabetes, and chronic back pain. Presents for eval for A-fib with RVR, and dyspnea  Met with patient at bedside. Reports she ate well at breakfast and has a good appetite at home. Eats 3 meals per day and will do ensure intermittently, prefers chocolate flavor. Denied any recent weight changes, reports UBW of 159 lbs. Current admit weight of 163 lbs, weight stable in chart history. Patient with mild diarrhea, no BM reported today. RD brought patient sprite and ice at end of visit.   Meds: SSI Novolog  TID with meals  SSI Novolog  daily  3 units Novolog  TID with meals Protonix    Labs: CBG 145-288 Sodium 127 Glu 233 BUN 41 Cr 1.27 Calcium  8.8  GFR 44  A1c 7.6 (09/22/23) NUTRITION - FOCUSED PHYSICAL EXAM:  Flowsheet Row Most Recent Value  Orbital Region No depletion  Upper Arm Region No depletion  Thoracic and Lumbar Region No depletion  Buccal Region No depletion  Temple Region No depletion  Clavicle Bone Region No depletion  Clavicle and Acromion Bone Region Mild depletion  Dorsal Hand No depletion  Patellar Region No depletion  Anterior Thigh Region No depletion  Posterior  Calf Region No depletion  Edema (RD Assessment) None  Hair Reviewed  Eyes Reviewed  Mouth Reviewed  Skin Reviewed  Nails Reviewed    Diet Order:   Diet Order             Diet heart healthy/carb modified Room service appropriate? Yes; Fluid consistency: Thin; Fluid restriction: 1500 mL Fluid  Diet effective now                   EDUCATION NEEDS:   No education needs have been identified at this time  Skin:  Skin Assessment: Reviewed RN Assessment  Last BM:  PTA  Height:   Ht Readings from Last 1 Encounters:  04/03/24 5' 5 (1.651 m)    Weight:   Wt Readings from Last 1 Encounters:  04/03/24 (P) 74.4 kg    Ideal Body Weight:  56.81 kg  BMI:  Body mass index is 27.29 kg/m (pended).  Estimated Nutritional Needs:   Kcal:  8362-8139 kcals  Protein:  74-89 grams  Fluid:  >1.5L/d    Alcide Memoli, MS, RD, LDN Clinical Dietitian  Please see AMiON for contact information.

## 2024-04-04 NOTE — Telephone Encounter (Signed)
 AF/RVR better controlled rates s/p COPD tx and rate control. Son had called earlier today and was frustrated that he wasn't updated with her care plan and wanted to discuss pacemaker. I left him a VM as he didn't answer. Several others tried to contact today as well. He had said there was some component of dementia with Ms. Doshier. I have had several in depth conversations with her and she is A&O x4 without any memory troubles during our discussion. She did mention that her and her son often get into arguments regarding her medical care. Either way no indication for PPM at this point with better rate controlled AF. She was not at all interested in pursuing further rhythm control with medications or AVNA. EP will sign off. I can update the son if I am able to reach him.   Donnice DELENA Primus, MD [facility]  Cardiac Electrophysiology

## 2024-04-04 NOTE — Progress Notes (Signed)
 Telemetry, chart check, d/w Dr. Almetta AFib 90'-120's, mostly 110s BP variable, suspect higher one may be spurious/machine error. Patient has firmly declined PPM and we are not likely to achieve durable rhythm control, she is largely asymptomatic from her AFib Rate control as able is recommended Continue current diltiazem  and metoprolol .  Continue care otherwise, pulm toilet/management with IM/attending service  EP will sign off though remain available. Will arrange cardiology follow up Please recall if needed.  Charlies Arthur, PA-C

## 2024-04-04 NOTE — Progress Notes (Signed)
 Physical Therapy Treatment Patient Details Name: Theresa Mendoza MRN: [id number] DOB: Aug 05, 1945 Today's Date: 04/04/2024   History of Present Illness Theresa Mendoza is a 78 y.o. female who presented [facility] ED 04/02/24 from cardiology office for evaluation for A-fib with RVR and dyspnea. PMHx: A-Fib, aortic stenosis, pulmonary hypertension, HTN, HLD, CKD3a, HFpEF, RA, anxiety/depression/BPD, COPD, glaucoma, migraine headaches, mild cognitive impairment, T2DM, and chronic back pain .    PT Comments  Pt received in chair, agreeable to therapy session but defers hallway distance gait trial or stair negotiation due to c/o increased back and R shoulder pain. RN notified (via charge RN, her RN on lunch) of pt c/o pain and that her bed alarm unable to bed set due to weight not registering. Pt needing up to Supervision for safety with transfers/gait short distance at bedside and cues for line awareness with IV/SpO2 cords. VSS on RA. Pt continues to benefit from PT services to progress toward functional mobility goals, plan to work more on gait/stair negotiation next session.    If plan is discharge home, recommend the following: A little help with walking and/or transfers;Assistance with cooking/housework;Assist for transportation;Help with stairs or ramp for entrance   Can travel by private vehicle        Equipment Recommendations  None recommended by PT    Recommendations for Other Services       Precautions / Restrictions Precautions Precautions: Fall Recall of Precautions/Restrictions: Intact Precaution/Restrictions Comments: HR goal <130 Restrictions Weight Bearing Restrictions Per Provider Order: No     Mobility  Bed Mobility Overal bed mobility: Needs Assistance Bed Mobility: Sit to Supine       Sit to supine: Supervision   General bed mobility comments: assist with line mgmt    Transfers Overall transfer level: Needs assistance Equipment used: None Transfers: Sit  to/from Stand, Bed to chair/wheelchair/BSC Sit to Stand: Supervision           General transfer comment: HR 93 bpm sitting in chair prior to transfer, no acute s/sx distress while standing; limited due to pt c/o back pain    Ambulation/Gait Ambulation/Gait assistance: Contact guard assist Gait Distance (Feet): 8 Feet Assistive device: None Gait Pattern/deviations: Step-through pattern, Decreased stride length, Shuffle       General Gait Details: pt defers longer distance due to c/o L shoulder and back pain, requesting return to supine after standing. RN notified of pt c/o pain (RN on lunch, charge RN notified at desk).   Stairs             Wheelchair Mobility     Tilt Bed    Modified Rankin (Stroke Patients Only)       Balance Overall balance assessment: Needs assistance Sitting-balance support: No upper extremity supported, Feet supported Sitting balance-Leahy Scale: Good     Standing balance support: Single extremity supported, During functional activity Standing balance-Leahy Scale: Fair Standing balance comment: no buckling or LOB; limited standing today                            Communication Communication Communication: No apparent difficulties  Cognition Arousal: Alert Behavior During Therapy: Flat affect   PT - Cognitive impairments: No family/caregiver present to determine baseline, Safety/Judgement                       PT - Cognition Comments: self-limiting due to c/o pain and fatigue; appears oriented to situation/location and self,  not further assessed. Following commands: Intact      Cueing Cueing Techniques: Verbal cues  Exercises Other Exercises Other Exercises: pt performed ankle pumps, hip flexion, LAQ a few reps ea for teachback to perform TID once pain less severe, pt agreeable, Pt given IS and encouraged to perform 5-10x hourly    General Comments General comments (skin integrity, edema, etc.): BP 140/60  (76) HR 93 bpm SpO2 100% on RA sitting in chair prior to return to supine      Pertinent Vitals/Pain Pain Assessment Pain Assessment: 0-10 Pain Score: 7  Pain Location: Back and R shoulder Pain Descriptors / Indicators: Stabbing, Discomfort, Shooting, Grimacing, Guarding Pain Intervention(s): Limited activity within patient's tolerance, Monitored during session, Repositioned, Patient requesting pain meds-RN notified, Heat applied (heat for R shoulder)    Home Living                          Prior Function            PT Goals (current goals can now be found in the care plan section) Acute Rehab PT Goals Patient Stated Goal: Return Home today PT Goal Formulation: With patient Time For Goal Achievement: 04/17/24 Progress towards PT goals: Progressing toward goals (slowly-pain)    Frequency    Min 2X/week      PT Plan      Co-evaluation              AM-PAC PT 6 Clicks Mobility   Outcome Measure  Help needed turning from your back to your side while in a flat bed without using bedrails?: None Help needed moving from lying on your back to sitting on the side of a flat bed without using bedrails?: A Little Help needed moving to and from a bed to a chair (including a wheelchair)?: A Little Help needed standing up from a chair using your arms (e.g., wheelchair or bedside chair)?: A Little Help needed to walk in hospital room?: A Little Help needed climbing 3-5 steps with a railing? : A Little 6 Click Score: 19    End of Session Equipment Utilized During Treatment: Gait belt Activity Tolerance: Patient limited by pain Patient left: in bed;with call bell/phone within reach;Other (comment) (RN and NT notified pt bed alarm not able to be set due to bed scale not working properly -may need bed weight reset or new bed sent.) Nurse Communication: Mobility status;Patient requests pain meds PT Visit Diagnosis: Unsteadiness on feet (R26.81)     Time:  8599-8586 PT Time Calculation (min) (ACUTE ONLY): 13 min  Charges:    $Therapeutic Activity: 8-22 mins PT General Charges $$ ACUTE PT VISIT: 1 Visit                     Theresa Forrest P., PTA Acute Rehabilitation Services Secure Chat Preferred 9a-5:30pm Office: [phone]    Connell HERO [facility] 04/04/2024, 4:10 PM

## 2024-04-04 NOTE — Plan of Care (Signed)
  Problem: Clinical Measurements: Goal: Respiratory complications will improve Outcome: Progressing   Problem: Activity: Goal: Risk for activity intolerance will decrease Outcome: Progressing   Problem: Nutrition: Goal: Adequate nutrition will be maintained Outcome: Progressing   Problem: Pain Managment: Goal: General experience of comfort will improve and/or be controlled Outcome: Progressing   Problem: Safety: Goal: Ability to remain free from injury will improve Outcome: Progressing   Problem: Respiratory: Goal: Ability to maintain a clear airway will improve Outcome: Progressing Goal: Levels of oxygenation will improve Outcome: Progressing   Problem: Activity: Goal: Ability to tolerate increased activity will improve Outcome: Progressing

## 2024-04-04 NOTE — Progress Notes (Signed)
 PROGRESS NOTE    Theresa Mendoza  FMW:[id number] DOB: 01-Feb-1946 DOA: 04/02/2024 PCP: Regino Slater, MD    Brief Narrative:  Theresa Mendoza  is a 78 y.o. female, with the past medical history of atrial fibrillation, aortic stenosis, pulmonary hypertension,hypertension, hyperlipidemia, CKD3a, HFpEF, rheumatoid arthritis, anxiety/depression/BPD, COPD, glaucoma, migraine headaches, mild cognitive impairment, type 2 diabetes, and chronic back pain . - Patient was sent from cardiology office for evaluation for A-fib with RVR, and dyspnea.  Patient with recent hospitalization, for A-fib with RVR, and chest pain, discharged 02/20/2024, she did follow-up with her cardiology office today, where she was noted to be in A-fib with RVR, when she was instructed to come to ED for further evaluation, she was transported to ED via EMS, she does complaints of shortness of breath, and chest pain, reports symptoms started yesterday, he does report dry cough, wheezing, as well palpitation and racing heart rate, she has some mild lower extremity edema as well, denies fever or chills - In ED she was noted to be in A-fib with RVR, heart rate on EKG showing 131, blood work significant for hypokalemia potassium of 3.3, BNP elevated at 169 (recent BNP was 376 last month), opponents mildly elevated at 25, no acute finding on chest x-ray, was started on IV steroids and Cardizem  drip in ED, and [facility] requested to admit.  Assessment and Plan: Permanent A-fib with RVR -Heart rate uncontrolled, in the 130s -  Eliquis  for anticoagulation. -Per cards: Patient has FIRMLY declined PPM, not interested in any attempts for rhythm control (meds or otherwise >> not likely to be successful) Will resume her PO dilt and home metoprolol    Hyponatremia -recheck -get osmos/urine studies -suspect due to dehydration-- will do IVF  COPD exacerbation - She is with significant wheezing, dyspnea presentation, she reports cough,  with productive phlegm. - Admitted under COPD pathway, will continue with Xopenex /ipratropium scheduled, will avoid albuterol  due to A-fib with RVR. - Will start on scheduled inhaled steroid Pulmicort , she can be transitioned to Trelegy on discharge - He has been tried on Advair and Trelegy in the past without much improvement but she did not follow-up , she will need to follow with her pulmonary on discharge, will need ambulatory referral to pulmonary on discharge.  -wean O2 to RA  Hypokalemia -Replaced   Chest pain -Not typical, related to COPD, recent nuclear stress test no evidence of ischemia or infarction, troponins mildly elevated in the setting of A-fib with RVR, and COPD exacerbation     Heart failure with improved ejection fraction -Received IV Lasix  in ED, at this point appears to be euvolemic, will continue with home dose Lasix . -Echocardiogram from 09/2023 with RVSP 45.9 mmHg, LA and RA with mild dilatation, mild mitral valve stenosis, moderate aortic valve stenosis.      CKD stage 3a, GFR 45-59 ml/min (HCC) -At baseline, avoid nephrotoxic medications   Type 2 diabetes mellitus with hyperlipidemia (HCC) -Keep on insulin  sliding scale during hospital stay as she is on steroids.   Tobacco abuse -She was counseled, will start on nicotine  patch     DVT prophylaxis:  apixaban  (ELIQUIS ) tablet 5 mg    Code Status: Limited: Do not attempt resuscitation (DNR) -DNR-LIMITED -Do Not Intubate/DNI  Family Communication: called son, no answer  Disposition Plan:  Level of care: Telemetry Cardiac Status is: Observation     Consultants:  Cardiology  Subjective: Sleepy this AM  Objective: Vitals:   04/04/24 0309 04/04/24 0811 04/04/24 1029 04/04/24 1030  BP: (!) 140/117 (!) 124/100 121/65 121/65  Pulse: (!) 108 100 93 (!) 51  Resp: 19 18  18   Temp: (!) 97.4 F (36.3 C)   97.8 F (36.6 C)  TempSrc: Oral   Oral  SpO2: 99% 96%  94%  Weight:      Height:         Intake/Output Summary (Last 24 hours) at 04/04/2024 1040 Last data filed at 04/03/2024 1046 Gross per 24 hour  Intake 252.78 ml  Output --  Net 252.78 ml   Filed Weights   04/03/24 1230  Weight: (P) 74.4 kg    Examination:    General: Appearance:     Overweight female in no acute distress     Lungs:    respirations unlabored, not on O2  Heart:    Bradycardic.     MS:   All extremities are intact.   Neurologic:   Awake, alert, oriented x 3. No apparent focal neurological           defect.        Data Reviewed: I have personally reviewed following labs and imaging studies  CBC: Recent Labs  Lab 04/02/24 1249 04/03/24 0407 04/04/24 0501  WBC 9.5 9.2 21.1*  NEUTROABS 6.7  --   --   HGB 11.5* 11.5* 11.7*  HCT 39.0 38.3 37.9  MCV 76.6* 76.1* 73.9*  PLT 319 361 449*   Basic Metabolic Panel: Recent Labs  Lab 04/02/24 1249 04/03/24 0407 04/04/24 0501  NA 135 132* 124*  K 3.3* 4.1 4.8  CL 98 95* 93*  CO2 20* 20* 19*  GLUCOSE 124* 183* 184*  BUN 10 19 43*  CREATININE 0.95 1.25* 1.40*  CALCIUM  8.6* 8.6* 8.3*  MG 2.0 2.0  --    GFR: Estimated Creatinine Clearance: 34.2 mL/min (A) (by C-G formula based on SCr of 1.4 mg/dL (H)). Liver Function Tests: No results for input(s): AST, ALT, ALKPHOS, BILITOT, PROT, ALBUMIN in the last 168 hours. No results for input(s): LIPASE, AMYLASE in the last 168 hours. No results for input(s): AMMONIA in the last 168 hours. Coagulation Profile: No results for input(s): INR, PROTIME in the last 168 hours. Cardiac Enzymes: No results for input(s): CKTOTAL, CKMB, CKMBINDEX, TROPONINI in the last 168 hours. BNP (last 3 results) No results for input(s): PROBNP in the last 8760 hours. HbA1C: No results for input(s): HGBA1C in the last 72 hours. CBG: Recent Labs  Lab 04/03/24 0904 04/03/24 1428 04/03/24 1709 04/03/24 2129 04/04/24 0805  GLUCAP 267* 206* 145* 213* 171*   Lipid  Profile: No results for input(s): CHOL, HDL, LDLCALC, TRIG, CHOLHDL, LDLDIRECT in the last 72 hours. Thyroid  Function Tests: Recent Labs    04/02/24 1436  TSH 0.839   Anemia Panel: No results for input(s): VITAMINB12, FOLATE, FERRITIN, TIBC, IRON, RETICCTPCT in the last 72 hours. Sepsis Labs: No results for input(s): PROCALCITON, LATICACIDVEN in the last 168 hours.  Recent Results (from the past 240 hours)  Resp panel by RT-PCR (RSV, Flu A&B, Covid) Anterior Nasal Swab     Status: None   Collection Time: 04/02/24 12:49 PM   Specimen: Anterior Nasal Swab  Result Value Ref Range Status   SARS Coronavirus 2 by RT PCR NEGATIVE NEGATIVE Final   Influenza A by PCR NEGATIVE NEGATIVE Final   Influenza B by PCR NEGATIVE NEGATIVE Final    Comment: (NOTE) The Xpert Xpress SARS-CoV-2/FLU/RSV plus assay is intended as an aid in the diagnosis of influenza from Nasopharyngeal swab  specimens and should not be used as a sole basis for treatment. Nasal washings and aspirates are unacceptable for Xpert Xpress SARS-CoV-2/FLU/RSV testing.  Fact Sheet for Patients: BloggerCourse.com  Fact Sheet for Healthcare Providers: SeriousBroker.it  This test is not yet approved or cleared by the [place]  FDA and has been authorized for detection and/or diagnosis of SARS-CoV-2 by FDA under an Emergency Use Authorization (EUA). This EUA will remain in effect (meaning this test can be used) for the duration of the COVID-19 declaration under Section 564(b)(1) of the Act, 21 U.S.C. section 360bbb-3(b)(1), unless the authorization is terminated or revoked.     Resp Syncytial Virus by PCR NEGATIVE NEGATIVE Final    Comment: (NOTE) Fact Sheet for Patients: BloggerCourse.com  Fact Sheet for Healthcare Providers: SeriousBroker.it  This test is not yet approved or cleared by the  [place]  FDA and has been authorized for detection and/or diagnosis of SARS-CoV-2 by FDA under an Emergency Use Authorization (EUA). This EUA will remain in effect (meaning this test can be used) for the duration of the COVID-19 declaration under Section 564(b)(1) of the Act, 21 U.S.C. section 360bbb-3(b)(1), unless the authorization is terminated or revoked.  Performed at [facility], [address], [place], [place] [address]          Radiology Studies: DG Chest Port 1 View Result Date: 04/02/2024 CLINICAL DATA:  cough EXAM: PORTABLE CHEST - 1 VIEW COMPARISON:  02/14/2024 FINDINGS: Chronic coarsening of the pulmonary interstitium without overt pulmonary edema. No focal airspace consolidation, pleural effusion, or pneumothorax. No cardiomegaly. Tortuous aorta with aortic atherosclerosis. No acute fracture or destructive lesions. Multilevel thoracic osteophytosis. IMPRESSION: No acute cardiopulmonary abnormality. Electronically Signed   By: Rogelia Myers M.D.   On: 04/02/2024 13:45        Scheduled Meds:  apixaban   5 mg Oral BID   budesonide  (PULMICORT ) nebulizer solution  2 mg Nebulization Q12H   diltiazem   360 mg Oral Daily   empagliflozin   25 mg Oral Daily   insulin  aspart  0-15 Units Subcutaneous TID WC   insulin  aspart  0-5 Units Subcutaneous QHS   ipratropium  0.5 mg Nebulization Q6H   levalbuterol   0.63 mg Nebulization Q6H   metoprolol  succinate  100 mg Oral Daily   pantoprazole   40 mg Oral Daily   pravastatin   40 mg Oral q1800   predniSONE   40 mg Oral Q breakfast   Continuous Infusions:  cefTRIAXone  (ROCEPHIN )  IV 1 g (04/03/24 1808)     LOS: 0 days    Time spent: 45 minutes spent on chart review, discussion with nursing staff, consultants, updating family and interview/physical exam; more than 50% of that time was spent in counseling and/or coordination of care.    Harlene RAYMOND Bowl, DO [facility] Available via Epic secure chat  7am-7pm After these hours, please refer to coverage provider listed on [email or web address] 04/04/2024, 10:40 AM

## 2024-04-04 NOTE — Inpatient Diabetes Management (Signed)
 Inpatient Diabetes Program Recommendations  AACE/ADA: New Consensus Statement on Inpatient Glycemic Control (2015)  Target Ranges:  Prepandial:   less than 140 mg/dL      Peak postprandial:   less than 180 mg/dL (1-2 hours)      Critically ill patients:  140 - 180 mg/dL   Lab Results  Component Value Date   GLUCAP 288 (H) 04/04/2024   HGBA1C 7.6 (H) 09/22/2023    Review of Glycemic Control  Latest Reference Range & Units 04/03/24 14:28 04/03/24 17:09 04/03/24 21:29 04/04/24 08:05 04/04/24 11:45  Glucose-Capillary 70 - 99 mg/dL 793 (H) 854 (H) 786 (H) 171 (H) 288 (H)   Diabetes history: DM Outpatient Diabetes medications:  Jardiance  25 mg daily Metformin  500 mg bid Current orders for Inpatient glycemic control:  Prednisone  40 mg daily Novolog  0-15 units tid with meals and HS Jardiance  25 mg daily Inpatient Diabetes Program Recommendations:   Consider adding Novolog  3 units tid with meals (hold if patient eats less than 50% or NPO).   Thanks,  Randall Bullocks, RN, BC-ADM Inpatient Diabetes Coordinator Pager [phone]  (8a-5p)

## 2024-04-04 NOTE — Progress Notes (Signed)
 Rufino [name] DASEN (son) is the Healthcare Power of Attorney/contact person for this pt and he wants to be contacted when rounding with pt or for decisions and updates about pt. He stated that pt has dementia, cannot make medical decisions, and very forgetful (cannot remember a thing said to her).

## 2024-04-04 NOTE — Plan of Care (Signed)
  Problem: Coping: Goal: Ability to adjust to condition or change in health will improve Outcome: Progressing   Problem: Health Behavior/Discharge Planning: Goal: Ability to manage health-related needs will improve Outcome: Progressing   Problem: Skin Integrity: Goal: Risk for impaired skin integrity will decrease Outcome: Progressing   Problem: Education: Goal: Knowledge of General Education information will improve Description: Including pain rating scale, medication(s)/side effects and non-pharmacologic comfort measures Outcome: Progressing   Problem: Health Behavior/Discharge Planning: Goal: Ability to manage health-related needs will improve Outcome: Progressing   Problem: Clinical Measurements: Goal: Cardiovascular complication will be avoided Outcome: Progressing   Problem: Skin Integrity: Goal: Risk for impaired skin integrity will decrease Outcome: Progressing

## 2024-04-04 NOTE — Progress Notes (Signed)
 Mobility Specialist Progress Note:    04/04/24 1531  Mobility  Activity Ambulated with assistance;Pivoted/transferred to/from [facility]  Level of Assistance Independent after set-up  Assistive Device BSC  Distance Ambulated (ft) 5 ft  Activity Response Tolerated well  Mobility Referral Yes  Mobility visit 1 Mobility  Mobility Specialist Start Time (ACUTE ONLY) 1531  Mobility Specialist Stop Time (ACUTE ONLY) 1541  Mobility Specialist Time Calculation (min) (ACUTE ONLY) 10 min   Pt requesting for assistance to go to bathroom. Pt not able to use room bathroom but can use BSC. Pt able to move and ambulate on own with assistance and no main AD. Pt needs cueing to not rush and trap herself in lines. Returned pt to bed w/ all needs met.   Venetia Keel Mobility Specialist Please Neurosurgeon or Rehab Office at [phone]

## 2024-04-05 ENCOUNTER — Other Ambulatory Visit (HOSPITAL_COMMUNITY): Payer: Self-pay

## 2024-04-05 DIAGNOSIS — E119 Type 2 diabetes mellitus without complications: Secondary | ICD-10-CM

## 2024-04-05 LAB — CBC
HCT: 40.1 % (ref 36.0–46.0)
Hemoglobin: 12.2 g/dL (ref 12.0–15.0)
MCH: 23.1 pg — ABNORMAL LOW (ref 26.0–34.0)
MCHC: 30.4 g/dL (ref 30.0–36.0)
MCV: 75.8 fL — ABNORMAL LOW (ref 80.0–100.0)
Platelets: 424 K/uL — ABNORMAL HIGH (ref 150–400)
RBC: 5.29 MIL/uL — ABNORMAL HIGH (ref 3.87–5.11)
RDW: 20.6 % — ABNORMAL HIGH (ref 11.5–15.5)
WBC: 17.6 K/uL — ABNORMAL HIGH (ref 4.0–10.5)
nRBC: 0.2 % (ref 0.0–0.2)

## 2024-04-05 LAB — BASIC METABOLIC PANEL WITH GFR
Anion gap: 11 (ref 5–15)
BUN: 39 mg/dL — ABNORMAL HIGH (ref 8–23)
CO2: 23 mmol/L (ref 22–32)
Calcium: 8.6 mg/dL — ABNORMAL LOW (ref 8.9–10.3)
Chloride: 100 mmol/L (ref 98–111)
Creatinine, Ser: 1.23 mg/dL — ABNORMAL HIGH (ref 0.44–1.00)
GFR, Estimated: 46 mL/min — ABNORMAL LOW (ref >60)
Glucose, Bld: 131 mg/dL — ABNORMAL HIGH (ref 70–99)
Potassium: 4.3 mmol/L (ref 3.5–5.1)
Sodium: 134 mmol/L — ABNORMAL LOW (ref 135–145)

## 2024-04-05 LAB — GLUCOSE, CAPILLARY: Glucose-Capillary: 132 mg/dL — ABNORMAL HIGH (ref 70–99)

## 2024-04-05 MED ORDER — DILTIAZEM HCL ER COATED BEADS 360 MG PO CP24
360.0000 mg | ORAL_CAPSULE | Freq: Every day | ORAL | 0 refills | Status: DC
  Filled 2024-04-05: qty 30, 30d supply, fill #0

## 2024-04-05 MED ORDER — METOPROLOL SUCCINATE ER 100 MG PO TB24
100.0000 mg | ORAL_TABLET | Freq: Every day | ORAL | 0 refills | Status: DC
  Filled 2024-04-05: qty 30, 30d supply, fill #0

## 2024-04-05 MED ORDER — HYDROCODONE-ACETAMINOPHEN 10-325 MG PO TABS
1.0000 | ORAL_TABLET | Freq: Three times a day (TID) | ORAL | Status: DC | PRN
Start: 2024-04-05 — End: 2024-04-05
  Administered 2024-04-05: 1 via ORAL
  Filled 2024-04-05: qty 1

## 2024-04-05 MED ORDER — HYDROXYZINE HCL 25 MG PO TABS
25.0000 mg | ORAL_TABLET | Freq: Three times a day (TID) | ORAL | 0 refills | Status: AC | PRN
  Filled 2024-04-05: qty 30, 10d supply, fill #0

## 2024-04-05 MED ORDER — PREDNISONE 20 MG PO TABS
40.0000 mg | ORAL_TABLET | Freq: Every day | ORAL | 0 refills | Status: DC
  Filled 2024-04-05: qty 4, 2d supply, fill #0

## 2024-04-05 NOTE — Progress Notes (Signed)
 Patient transferred to Dc lounge, attempted to call Son [name] unsuccessfully. HIPAA compliance voice message left that patient will discharge home via Taxi.

## 2024-04-05 NOTE — Discharge Summary (Addendum)
 Physician Discharge Summary  Theresa Mendoza FMW:[id number] DOB: Feb 13, 1946 DOA: 04/02/2024  PCP: Regino Slater, MD  Admit date: 04/02/2024 Discharge date: 04/05/2024  Admitted From:  Discharge disposition: Home   Recommendations for Outpatient Follow-Up:   Home health Continue to follow with neurology/neuropsych for evaluation of cognitive issues-per last chart review it appears her psych issues are the main cause Referral to outpatient pulmonology for follow-up if not already done Close outpatient psych follow-up: Patient has anxiety and says she takes her ex-husband's Valium  at home-Will do a trial of hydroxyzine  and defer to her psychiatrist   Discharge Diagnosis:   Active Problems:   Essential hypertension   Type 2 diabetes mellitus with hyperlipidemia (HCC)   Hyperlipidemia   Stage 3a chronic kidney disease (HCC)   Atrial fibrillation with RVR (HCC)    Discharge Condition: Improved.  Diet recommendation:  Carbohydrate-modified.    Wound care: None.  Code status: Full.   History of Present Illness:   Theresa Mendoza  is a 78 y.o. female, with the past medical history of atrial fibrillation, aortic stenosis, pulmonary hypertension,hypertension, hyperlipidemia, CKD3a, HFpEF, rheumatoid arthritis, anxiety/depression/BPD, COPD, glaucoma, migraine headaches, mild cognitive impairment, type 2 diabetes, and chronic back pain . - Patient was sent from cardiology office for evaluation for A-fib with RVR, and dyspnea.  Patient with recent hospitalization, for A-fib with RVR, and chest pain, discharged 02/20/2024, she did follow-up with her cardiology office today, where she was noted to be in A-fib with RVR, when she was instructed to come to ED for further evaluation, she was transported to ED via EMS, she does complaints of shortness of breath, and chest pain, reports symptoms started yesterday, he does report dry cough, wheezing, as well palpitation and racing heart  rate, she has some mild lower extremity edema as well, denies fever or chills - In ED she was noted to be in A-fib with RVR, heart rate on EKG showing 131, blood work significant for hypokalemia potassium of 3.3, BNP elevated at 169 (recent BNP was 376 last month), opponents mildly elevated at 25, no acute finding on chest x-ray, was started on IV steroids and Cardizem  drip in ED, and [facility] hospitalist requested to admit.     Hospital Course by Problem:   Permanent A-fib with RVR -Heart rate uncontrolled, in the 130s -  Eliquis  for anticoagulation. -Per cards: Patient has FIRMLY declined PPM, not interested in any attempts for rhythm control (meds or otherwise >> not likely to be successful and currently not indicated Will resume her PO dilt and home metoprolol    Hyponatremia Suspect due to dehydration, resolved with IV fluids   COPD exacerbation - She is with significant wheezing, dyspnea presentation, she reports cough, with productive phlegm. - Admitted under COPD pathway, will continue with Xopenex /ipratropium scheduled, will avoid albuterol  due to A-fib with RVR. - Will start on scheduled inhaled steroid Pulmicort , she can be transitioned to Trelegy on discharge - He has been tried on Advair and Trelegy in the past without much improvement but she did not follow-up , she will need to follow with her pulmonary on discharge, will need ambulatory referral to pulmonary on discharge.  -weaned O2 to RA   Hypokalemia -Replaced   Chest pain -Not typical, related to COPD, recent nuclear stress test no evidence of ischemia or infarction, troponins mildly elevated in the setting of A-fib with RVR, and COPD exacerbation     Heart failure with improved ejection fraction -Received IV Lasix  in ED,  at this point appears to be euvolemic, will continue with home dose Lasix . -Echocardiogram from 09/2023 with RVSP 45.9 mmHg, LA and RA with mild dilatation, mild mitral valve stenosis, moderate aortic  valve stenosis.      AKI on CKD stage 3a, GFR 45-59 ml/min (HCC) -Improved with IV fluids   Type 2 diabetes mellitus with hyperlipidemia (HCC) -Resume home meds.   Tobacco abuse -She was counseled      Medical Consultants:    Cardiology  Discharge Exam:   Vitals:   04/05/24 0814 04/05/24 0820  BP: (!) 148/98 (!) 143/96  Pulse: (!) 106 98  Resp: 20 18  Temp:  97.7 F (36.5 C)  SpO2: 97% 100%   Vitals:   04/04/24 2311 04/05/24 0525 04/05/24 0814 04/05/24 0820  BP: 135/80 (!) 148/86 (!) 148/98 (!) 143/96  Pulse: (!) 101 98 (!) 106 98  Resp: 18 20 20 18   Temp: 98 F (36.7 C) 98.2 F (36.8 C)  97.7 F (36.5 C)  TempSrc: Oral Oral  Oral  SpO2: 97% 98% 97% 100%  Weight:      Height:        General exam: Appears calm and comfortable.  Would like to go home   The results of significant diagnostics from this hospitalization (including imaging, microbiology, ancillary and laboratory) are listed below for reference.     Procedures and Diagnostic Studies:   DG Chest Port 1 View Result Date: 04/02/2024 CLINICAL DATA:  cough EXAM: PORTABLE CHEST - 1 VIEW COMPARISON:  02/14/2024 FINDINGS: Chronic coarsening of the pulmonary interstitium without overt pulmonary edema. No focal airspace consolidation, pleural effusion, or pneumothorax. No cardiomegaly. Tortuous aorta with aortic atherosclerosis. No acute fracture or destructive lesions. Multilevel thoracic osteophytosis. IMPRESSION: No acute cardiopulmonary abnormality. Electronically Signed   By: Rogelia Myers M.D.   On: 04/02/2024 13:45     Labs:   Basic Metabolic Panel: Recent Labs  Lab 04/02/24 1249 04/03/24 0407 04/04/24 0501 04/04/24 1330 04/05/24 0606  NA 135 132* 124* 127* 134*  K 3.3* 4.1 4.8 4.6 4.3  CL 98 95* 93* 93* 100  CO2 20* 20* 19* 23 23  GLUCOSE 124* 183* 184* 233* 131*  BUN 10 19 43* 41* 39*  CREATININE 0.95 1.25* 1.40* 1.27* 1.23*  CALCIUM  8.6* 8.6* 8.3* 8.8* 8.6*  MG 2.0 2.0  --   --    --    GFR Estimated Creatinine Clearance: 38.9 mL/min (A) (by C-G formula based on SCr of 1.23 mg/dL (H)). Liver Function Tests: No results for input(s): AST, ALT, ALKPHOS, BILITOT, PROT, ALBUMIN in the last 168 hours. No results for input(s): LIPASE, AMYLASE in the last 168 hours. No results for input(s): AMMONIA in the last 168 hours. Coagulation profile No results for input(s): INR, PROTIME in the last 168 hours.  CBC: Recent Labs  Lab 04/02/24 1249 04/03/24 0407 04/04/24 0501 04/05/24 0606  WBC 9.5 9.2 21.1* 17.6*  NEUTROABS 6.7  --   --   --   HGB 11.5* 11.5* 11.7* 12.2  HCT 39.0 38.3 37.9 40.1  MCV 76.6* 76.1* 73.9* 75.8*  PLT 319 361 449* 424*   Cardiac Enzymes: No results for input(s): CKTOTAL, CKMB, CKMBINDEX, TROPONINI in the last 168 hours. BNP: Invalid input(s): POCBNP CBG: Recent Labs  Lab 04/04/24 0805 04/04/24 1145 04/04/24 1639 04/04/24 2051 04/05/24 0812  GLUCAP 171* 288* 133* 132* 132*   D-Dimer No results for input(s): DDIMER in the last 72 hours. Hgb A1c No results for input(s):  HGBA1C in the last 72 hours. Lipid Profile No results for input(s): CHOL, HDL, LDLCALC, TRIG, CHOLHDL, LDLDIRECT in the last 72 hours. Thyroid  function studies Recent Labs    04/02/24 1436  TSH 0.839   Anemia work up No results for input(s): VITAMINB12, FOLATE, FERRITIN, TIBC, IRON, RETICCTPCT in the last 72 hours. Microbiology Recent Results (from the past 240 hours)  Resp panel by RT-PCR (RSV, Flu A&B, Covid) Anterior Nasal Swab     Status: None   Collection Time: 04/02/24 12:49 PM   Specimen: Anterior Nasal Swab  Result Value Ref Range Status   SARS Coronavirus 2 by RT PCR NEGATIVE NEGATIVE Final   Influenza A by PCR NEGATIVE NEGATIVE Final   Influenza B by PCR NEGATIVE NEGATIVE Final    Comment: (NOTE) The Xpert Xpress SARS-CoV-2/FLU/RSV plus assay is intended as an aid in the diagnosis of  influenza from Nasopharyngeal swab specimens and should not be used as a sole basis for treatment. Nasal washings and aspirates are unacceptable for Xpert Xpress SARS-CoV-2/FLU/RSV testing.  Fact Sheet for Patients: BloggerCourse.com  Fact Sheet for Healthcare Providers: SeriousBroker.it  This test is not yet approved or cleared by the [place]  FDA and has been authorized for detection and/or diagnosis of SARS-CoV-2 by FDA under an Emergency Use Authorization (EUA). This EUA will remain in effect (meaning this test can be used) for the duration of the COVID-19 declaration under Section 564(b)(1) of the Act, 21 U.S.C. section 360bbb-3(b)(1), unless the authorization is terminated or revoked.     Resp Syncytial Virus by PCR NEGATIVE NEGATIVE Final    Comment: (NOTE) Fact Sheet for Patients: BloggerCourse.com  Fact Sheet for Healthcare Providers: SeriousBroker.it  This test is not yet approved or cleared by the [place]  FDA and has been authorized for detection and/or diagnosis of SARS-CoV-2 by FDA under an Emergency Use Authorization (EUA). This EUA will remain in effect (meaning this test can be used) for the duration of the COVID-19 declaration under Section 564(b)(1) of the Act, 21 U.S.C. section 360bbb-3(b)(1), unless the authorization is terminated or revoked.  Performed at [facility], [address] [place], [place] [address]      Discharge Instructions:   Discharge Instructions     Diet - low sodium heart healthy   Complete by: As directed    Diet Carb Modified   Complete by: As directed    Discharge instructions   Complete by: As directed    Stop smoking   Increase activity slowly   Complete by: As directed       Allergies as of 04/05/2024       Reactions   Lithobid [lithium] Other (See Comments)   Suicidal ideation   Tegretol  [carbamazepine] Other (See Comments)   Melted her skin        Medication List     STOP taking these medications    losartan  25 MG tablet Commonly known as: COZAAR        TAKE these medications    diltiazem  360 MG 24 hr capsule Commonly known as: CARDIZEM  CD Take 1 capsule (360 mg total) by mouth daily. Start taking on: April 06, 2024 What changed:  medication strength how much to take Another medication with the same name was removed. Continue taking this medication, and follow the directions you see here.   DULoxetine  60 MG capsule Commonly known as: CYMBALTA  TAKE 2 CAPSULES BY MOUTH EVERY DAY   Eliquis  5 MG Tabs tablet Generic drug: apixaban  Take 1  tablet (5 mg total) by mouth 2 (two) times daily. NEEDS CARDIOLOGY APPT FOR ELIQUIS  REFILLS, PLEASE CALL OFFICE   empagliflozin  25 MG Tabs tablet Commonly known as: Jardiance  Take 1 tablet (25 mg total) by mouth daily.   furosemide  20 MG tablet Commonly known as: LASIX  Take 20 mg by mouth every other day.   HYDROcodone -acetaminophen  10-325 MG tablet Commonly known as: NORCO Take 1 tablet by mouth 3 (three) times daily as needed for moderate pain (pain score 4-6).   hydrOXYzine  25 MG tablet Commonly known as: ATARAX  Take 1 tablet (25 mg total) by mouth 3 (three) times daily as needed for anxiety.   metFORMIN  500 MG tablet Commonly known as: Glucophage  Take 1 tablet (500 mg total) by mouth 2 (two) times daily with a meal. What changed: when to take this   metoprolol  succinate 100 MG 24 hr tablet Commonly known as: TOPROL -XL Take 1 tablet (100 mg total) by mouth daily. Take with or immediately following a meal. Start taking on: April 06, 2024 What changed: Another medication with the same name was removed. Continue taking this medication, and follow the directions you see here.   nitroGLYCERIN  0.4 MG SL tablet Commonly known as: NITROSTAT  Place 1 tablet (0.4 mg total) under the tongue every 5 (five)  minutes as needed for chest pain.   omeprazole 40 MG capsule Commonly known as: PRILOSEC Take 40 mg by mouth daily as needed (heartburn).   ondansetron  4 MG tablet Commonly known as: ZOFRAN  Take 4 mg by mouth every 8 (eight) hours as needed for vomiting or nausea.   pravastatin  40 MG tablet Commonly known as: PRAVACHOL  Take 1 tablet by mouth daily.   predniSONE  20 MG tablet Commonly known as: DELTASONE  Take 2 tablets (40 mg total) by mouth daily with breakfast. Start taking on: April 06, 2024        Follow-up Information     [facility] Follow up.   Specialty: Home Health Services Why: Physical and Occupational Therapy-office to call with visi times. Contact information: [address] [place] [address] [phone]         Koirala, Dibas, MD Follow up.   Specialty: Family Medicine Contact information: [address] [place] [address] [phone]                  Time coordinating discharge: 45 minutes  Signed:  Harlene RAYMOND Bowl DO  [facility] 04/05/2024, 9:14 AM

## 2024-04-05 NOTE — Plan of Care (Signed)
  Problem: Metabolic: Goal: Ability to maintain appropriate glucose levels will improve Outcome: Progressing   Problem: Clinical Measurements: Goal: Respiratory complications will improve Outcome: Progressing Goal: Cardiovascular complication will be avoided Outcome: Progressing   Problem: Activity: Goal: Risk for activity intolerance will decrease Outcome: Progressing   Problem: Nutrition: Goal: Adequate nutrition will be maintained Outcome: Progressing   Problem: Pain Managment: Goal: General experience of comfort will improve and/or be controlled Outcome: Progressing   Problem: Safety: Goal: Ability to remain free from injury will improve Outcome: Progressing   Problem: Activity: Goal: Ability to tolerate increased activity will improve Outcome: Progressing   Problem: Activity: Goal: Ability to tolerate increased activity will improve Outcome: Progressing   Problem: Cardiac: Goal: Ability to achieve and maintain adequate cardiopulmonary perfusion will improve Outcome: Progressing

## 2024-04-05 NOTE — TOC CM/SW Note (Signed)
 Pt has been DC. Notified Joane at [facility] that pt has been DC. Pt needs assistance with transportation. The DC lounge will provide a taxi voucher.

## 2024-04-05 NOTE — Progress Notes (Signed)
 Discharge Nurse Summary: DC order noted per MD. DC RN at bedside with patient. Patient observed sleeping, arousable with verbal stimulation. Noted the patient had an elopement event early this am, concerned for appropriateness to the dc lounge - CM/Dr Juvenal informed to discuss discharge plan. States plan is for patient to dc home with taxi voucher d/t family out of town.   To ensure patient safety, this RN assessed patient behavior and mobility. Noted patient received dose of norco this am around 9am. Patient demonstrated ability to transfer with touch/supervision and ambulate greater than 50 ft with touch/supervision, slow gait, and balance correction x3. CM/Dr. Juvenal informed patient is only safe to return home with supervision during ambulation due to high fall risk.     Per discussion with primary RN, states the patient's son will be present later today after patient returns home. States patient is okay to discharge home, CM/Dr. Juvenal informed. Patient agreeable with discharge plan, agreeable to transport to dc lounge for taxi voucher.  AVS printed/reviewed. PIV removed, skin intact. Telemonitor returned to charging station. No DME needs. CP/Edu resolved. Patient dressed and ready to go. All belongings accounted for. Patient wheeled patient downstairs to [facility]. TOC meds picked up on the way. Taxi voucher provided.   Rosario EMERSON Lund, RN

## 2024-04-05 NOTE — Progress Notes (Signed)
 At around 0330 hrs, patient got out of the unit through emergency exit door and went to [facility]. Emergency exit door alarm did not went off. Charge RN received a call from [employer] that patient was found in their unit. Patient was transported back to [facility] via wheelchair. Patient is alert and oriented to self, place and time, no signs of any trauma, denies any symptoms. At 0334hrs, assisted patient back to her room, placed back on bed, attached her back on cardiac monitor, call bell within reach and put the bed alarm on. MD made aware. Facility maintenance staff was notified of the emergency exit door alarm not working.

## 2024-04-06 DIAGNOSIS — I4891 Unspecified atrial fibrillation: Secondary | ICD-10-CM | POA: Diagnosis not present

## 2024-04-10 ENCOUNTER — Emergency Department (HOSPITAL_COMMUNITY)

## 2024-04-10 ENCOUNTER — Emergency Department (HOSPITAL_COMMUNITY)
Admission: EM | Admit: 2024-04-10 | Discharge: 2024-04-10 | Disposition: A | Discharge status: Home / Self Care | Attending: Emergency Medicine | Admitting: Emergency Medicine

## 2024-04-10 ENCOUNTER — Other Ambulatory Visit: Payer: Self-pay

## 2024-04-10 ENCOUNTER — Encounter (HOSPITAL_COMMUNITY): Payer: Self-pay

## 2024-04-10 DIAGNOSIS — J449 Chronic obstructive pulmonary disease, unspecified: Secondary | ICD-10-CM | POA: Diagnosis not present

## 2024-04-10 DIAGNOSIS — I499 Cardiac arrhythmia, unspecified: Secondary | ICD-10-CM | POA: Diagnosis not present

## 2024-04-10 DIAGNOSIS — E1165 Type 2 diabetes mellitus with hyperglycemia: Secondary | ICD-10-CM | POA: Diagnosis not present

## 2024-04-10 DIAGNOSIS — R110 Nausea: Secondary | ICD-10-CM | POA: Diagnosis not present

## 2024-04-10 DIAGNOSIS — R079 Chest pain, unspecified: Secondary | ICD-10-CM | POA: Diagnosis not present

## 2024-04-10 DIAGNOSIS — Z79899 Other long term (current) drug therapy: Secondary | ICD-10-CM | POA: Diagnosis not present

## 2024-04-10 DIAGNOSIS — I4891 Unspecified atrial fibrillation: Secondary | ICD-10-CM | POA: Diagnosis present

## 2024-04-10 DIAGNOSIS — N1831 Chronic kidney disease, stage 3a: Secondary | ICD-10-CM | POA: Diagnosis not present

## 2024-04-10 DIAGNOSIS — Z7901 Long term (current) use of anticoagulants: Secondary | ICD-10-CM | POA: Diagnosis not present

## 2024-04-10 DIAGNOSIS — R231 Pallor: Secondary | ICD-10-CM | POA: Diagnosis not present

## 2024-04-10 DIAGNOSIS — R42 Dizziness and giddiness: Secondary | ICD-10-CM | POA: Diagnosis not present

## 2024-04-10 DIAGNOSIS — I4821 Permanent atrial fibrillation: Secondary | ICD-10-CM | POA: Diagnosis not present

## 2024-04-10 LAB — RESP PANEL BY RT-PCR (RSV, FLU A&B, COVID)  RVPGX2
Influenza A by PCR: NEGATIVE
Influenza B by PCR: NEGATIVE
Resp Syncytial Virus by PCR: NEGATIVE
SARS Coronavirus 2 by RT PCR: NEGATIVE

## 2024-04-10 LAB — COMPREHENSIVE METABOLIC PANEL WITH GFR
ALT: 21 U/L (ref 0–44)
AST: 12 U/L — ABNORMAL LOW (ref 15–41)
Albumin: 3 g/dL — ABNORMAL LOW (ref 3.5–5.0)
Alkaline Phosphatase: 74 U/L (ref 38–126)
Anion gap: 9 (ref 5–15)
BUN: 7 mg/dL — ABNORMAL LOW (ref 8–23)
CO2: 27 mmol/L (ref 22–32)
Calcium: 8.4 mg/dL — ABNORMAL LOW (ref 8.9–10.3)
Chloride: 102 mmol/L (ref 98–111)
Creatinine, Ser: 0.87 mg/dL (ref 0.44–1.00)
GFR, Estimated: >60 mL/min
Glucose, Bld: 96 mg/dL (ref 70–99)
Potassium: 3.6 mmol/L (ref 3.5–5.1)
Sodium: 138 mmol/L (ref 135–145)
Total Bilirubin: 0.6 mg/dL (ref 0.0–1.2)
Total Protein: 5.7 g/dL — ABNORMAL LOW (ref 6.5–8.1)

## 2024-04-10 LAB — CBC WITH DIFFERENTIAL/PLATELET
Abs Immature Granulocytes: 0.08 10*3/uL — ABNORMAL HIGH (ref 0.00–0.07)
Basophils Absolute: 0.1 10*3/uL (ref 0.0–0.1)
Basophils Relative: 1 %
Eosinophils Absolute: 0.2 10*3/uL (ref 0.0–0.5)
Eosinophils Relative: 2 %
HCT: 41.4 % (ref 36.0–46.0)
Hemoglobin: 12.1 g/dL (ref 12.0–15.0)
Immature Granulocytes: 1 %
Lymphocytes Relative: 30 %
Lymphs Abs: 3.7 10*3/uL (ref 0.7–4.0)
MCH: 22.6 pg — ABNORMAL LOW (ref 26.0–34.0)
MCHC: 29.2 g/dL — ABNORMAL LOW (ref 30.0–36.0)
MCV: 77.2 fL — ABNORMAL LOW (ref 80.0–100.0)
Monocytes Absolute: 0.9 10*3/uL (ref 0.1–1.0)
Monocytes Relative: 8 %
Neutro Abs: 7.2 10*3/uL (ref 1.7–7.7)
Neutrophils Relative %: 58 %
Platelets: 405 10*3/uL — ABNORMAL HIGH (ref 150–400)
RBC: 5.36 MIL/uL — ABNORMAL HIGH (ref 3.87–5.11)
RDW: 19.7 % — ABNORMAL HIGH (ref 11.5–15.5)
WBC: 12.2 10*3/uL — ABNORMAL HIGH (ref 4.0–10.5)
nRBC: 0 % (ref 0.0–0.2)

## 2024-04-10 LAB — I-STAT CG4 LACTIC ACID, ED: Lactic Acid, Venous: 1.4 mmol/L (ref 0.5–1.9)

## 2024-04-10 LAB — URINALYSIS, ROUTINE W REFLEX MICROSCOPIC
Bacteria, UA: NONE SEEN
Bilirubin Urine: NEGATIVE
Glucose, UA: >=500 mg/dL — AB
Hgb urine dipstick: NEGATIVE
Ketones, ur: NEGATIVE mg/dL
Nitrite: NEGATIVE
Protein, ur: NEGATIVE mg/dL
Specific Gravity, Urine: 1.012 (ref 1.005–1.030)
pH: 7 (ref 5.0–8.0)

## 2024-04-10 LAB — MAGNESIUM: Magnesium: 2 mg/dL (ref 1.7–2.4)

## 2024-04-10 MED ORDER — SODIUM CHLORIDE 0.9 % IV BOLUS
1000.0000 mL | Freq: Once | INTRAVENOUS | Status: AC
  Administered 2024-04-10: 1000 mL via INTRAVENOUS

## 2024-04-10 MED ORDER — ACETAMINOPHEN 500 MG PO TABS
1000.0000 mg | ORAL_TABLET | Freq: Once | ORAL | Status: AC
  Administered 2024-04-10: 1000 mg via ORAL
  Filled 2024-04-10: qty 2

## 2024-04-10 NOTE — ED Triage Notes (Addendum)
 Per EMS:  A. Fib with RVR Dizziness EMS meds in route 20mg  of Cardizem  Initial HR 150-170 After HR 80-120 of IVF

## 2024-04-10 NOTE — ED Provider Notes (Signed)
 [place] EMERGENCY DEPARTMENT AT [facility] Provider Note   CSN: [id number] Arrival date & time: 04/10/24  1543     Patient presents with: No chief complaint on file.   Syndey Jaskolski Mendoza is a 78 y.o. female.   78 yo F with a chief complaints of not feeling well.  She tells me has been going on for a long time ever since she has been diagnosed with atrial fibrillation.  Does not say that she was significantly different today than she has been previously.  Felt that when her heart was going fast feels better after EMS have given her medication.  Has been coughing a little bit.  No fevers that she knows of.  No nausea vomiting or diarrhea.  Denies urinary symptoms.         Prior to Admission medications   Medication Sig Start Date End Date Taking? Authorizing Provider  apixaban  (ELIQUIS ) 5 MG TABS tablet Take 1 tablet (5 mg total) by mouth 2 (two) times daily. NEEDS CARDIOLOGY APPT FOR ELIQUIS  REFILLS, PLEASE CALL OFFICE 02/20/24   Arrien, Elidia Sieving, MD  diltiazem  (CARDIZEM  CD) 360 MG 24 hr capsule Take 1 capsule (360 mg total) by mouth daily. 04/06/24   Vann, Jessica U, DO  DULoxetine  (CYMBALTA ) 60 MG capsule TAKE 2 CAPSULES BY MOUTH EVERY DAY 10/09/19   Plovsky, Elna, MD  empagliflozin  (JARDIANCE ) 25 MG TABS tablet Take 1 tablet (25 mg total) by mouth daily. 02/20/24   Arrien, Elidia Sieving, MD  furosemide  (LASIX ) 20 MG tablet Take 20 mg by mouth every other day.    [provider]  HYDROcodone -acetaminophen  (NORCO) 10-325 MG tablet Take 1 tablet by mouth 3 (three) times daily as needed for moderate pain (pain score 4-6). 03/09/24   [provider]  hydrOXYzine  (ATARAX ) 25 MG tablet Take 1 tablet (25 mg total) by mouth 3 (three) times daily as needed for anxiety. 04/05/24   Vann, Jessica U, DO  metFORMIN  (GLUCOPHAGE ) 500 MG tablet Take 1 tablet (500 mg total) by mouth 2 (two) times daily with a meal. Patient taking differently: Take 500 mg by mouth  daily. 10/15/21 04/26/24  Gonfa, Taye T, MD  metoprolol  succinate (TOPROL -XL) 100 MG 24 hr tablet Take 1 tablet (100 mg total) by mouth daily. Take with or immediately following a meal. 04/06/24   Juvenal Raisin U, DO  nitroGLYCERIN  (NITROSTAT ) 0.4 MG SL tablet Place 1 tablet (0.4 mg total) under the tongue every 5 (five) minutes as needed for chest pain. 09/24/23   Cheryle Page, MD  omeprazole (PRILOSEC) 40 MG capsule Take 40 mg by mouth daily as needed (heartburn). 09/07/20   [provider]  ondansetron  (ZOFRAN ) 4 MG tablet Take 4 mg by mouth every 8 (eight) hours as needed for vomiting or nausea. 02/26/24   [provider]  pravastatin  (PRAVACHOL ) 40 MG tablet Take 1 tablet by mouth daily. 03/17/21   [provider]  predniSONE  (DELTASONE ) 20 MG tablet Take 2 tablets (40 mg total) by mouth daily with breakfast. 04/06/24   Juvenal Raisin PENNER, DO    Allergies: Lithobid [lithium] and Tegretol [carbamazepine]    Review of Systems  Updated Vital Signs BP (!) 152/110   Pulse 68   Temp 98 F (36.7 C) (Oral)   Resp 19   SpO2 96%   Physical Exam Vitals and nursing note reviewed.  Constitutional:      General: She is not in acute distress.    Appearance: She is well-developed. She  is not diaphoretic.     Comments: Skin is quite warm to the touch.  HENT:     Head: Normocephalic and atraumatic.  Eyes:     Pupils: Pupils are equal, round, and reactive to light.  Cardiovascular:     Rate and Rhythm: Normal rate. Rhythm irregular.     Heart sounds: No murmur heard.    No friction rub. No gallop.  Pulmonary:     Effort: Pulmonary effort is normal.     Breath sounds: No wheezing or rales.  Abdominal:     General: There is no distension.     Palpations: Abdomen is soft.     Tenderness: There is no abdominal tenderness.  Musculoskeletal:        General: No tenderness.     Cervical back: Normal range of motion and neck supple.  Skin:    General: Skin is warm and dry.   Neurological:     Mental Status: She is alert and oriented to person, place, and time.  Psychiatric:        Behavior: Behavior normal.     (all labs ordered are listed, but only abnormal results are displayed) Labs Reviewed  CBC WITH DIFFERENTIAL/PLATELET - Abnormal; Notable for the following components:      Result Value   WBC 12.2 (*)    RBC 5.36 (*)    MCV 77.2 (*)    MCH 22.6 (*)    MCHC 29.2 (*)    RDW 19.7 (*)    Platelets 405 (*)    Abs Immature Granulocytes 0.08 (*)    All other components within normal limits  COMPREHENSIVE METABOLIC PANEL WITH GFR - Abnormal; Notable for the following components:   BUN 7 (*)    Calcium  8.4 (*)    Total Protein 5.7 (*)    Albumin 3.0 (*)    AST 12 (*)    All other components within normal limits  URINALYSIS, ROUTINE W REFLEX MICROSCOPIC - Abnormal; Notable for the following components:   Glucose, UA >=500 (*)    Leukocytes,Ua MODERATE (*)    All other components within normal limits  RESP PANEL BY RT-PCR (RSV, FLU A&B, COVID)  RVPGX2  CULTURE, BLOOD (ROUTINE X 2)  CULTURE, BLOOD (ROUTINE X 2)  MAGNESIUM   I-STAT CG4 LACTIC ACID, ED  I-STAT CG4 LACTIC ACID, ED    EKG: None  Radiology: [facility] Chest Port 1 View Result Date: 04/10/2024 CLINICAL DATA:  Fatigue.  Atrial fibrillation. EXAM: PORTABLE CHEST 1 VIEW COMPARISON:  04/02/2024 FINDINGS: Heart size and pulmonary vascularity are normal for technique. Lungs are clear. No pleural effusion or pneumothorax. Mediastinal contours appear intact. Degenerative changes in the spine and shoulders. Calcification of the aorta. IMPRESSION: No active disease. Electronically Signed   By: Elsie Gravely M.D.   On: 04/10/2024 18:12     Procedures   Medications Ordered in the ED  sodium chloride  0.9 % bolus 1,000 mL (0 mLs Intravenous Stopped 04/10/24 1843)  acetaminophen  (TYLENOL ) tablet 1,000 mg (1,000 mg Oral Given 04/10/24 1659)                                    Medical Decision  Making Amount and/or Complexity of Data Reviewed Labs: ordered. Radiology: ordered.  Risk OTC drugs.   78 yo F with a history of atrial fibrillation comes in with a chief complaints of a rapid ventricular rate.  She said  that she was feeling really bad when this occurred and feels better after giving medicine with EMS.  On record review the patient has a diagnosis of permanent atrial fibrillation.  She was recently taken off digoxin .  She has been complaining of cough and feels quite warm on exam.  Concern for possible infection will obtain blood work bolus of IV fluids reassess.  Patient feeling better on repeat exam.  Continues to have heart rates in the 80s and 90s.  Chest x-ray independently interpreted by me without focal infiltrate or pneumothorax.  Lactate normal.  No significant electrolyte abnormalities.  Magnesium  normal.  UA without obvious infection.  COVID flu and RSV are negative.  I discussed the results with patient and family.  Patient is very enthusiastic about going home.  Family is a bit more concerned.  They reported seeing the PCP earlier today and there is some concern for persistent tachycardia as well as worsening lung sounds per them.  I do not feel right to hold the patient against her will.  Test here were reassuring.  Will have her follow-up with her PCP in clinic.  Given cardiology follow-up as well.  7:03 PM:  I have discussed the diagnosis/risks/treatment options with the patient and family.  Evaluation and diagnostic testing in the emergency department does not suggest an emergent condition requiring admission or immediate intervention beyond what has been performed at this time.  They will follow up with PCP, cards. We also discussed returning to the ED immediately if new or worsening sx occur. We discussed the sx which are most concerning (e.g., sudden worsening pain, fever, inability to tolerate by mouth) that necessitate immediate return. Medications  administered to the patient during their visit and any new prescriptions provided to the patient are listed below.  Medications given during this visit Medications  sodium chloride  0.9 % bolus 1,000 mL (0 mLs Intravenous Stopped 04/10/24 1843)  acetaminophen  (TYLENOL ) tablet 1,000 mg (1,000 mg Oral Given 04/10/24 1659)     The patient appears reasonably screen and/or stabilized for discharge and I doubt any other medical condition or other [facility] requiring further screening, evaluation, or treatment in the ED at this time prior to discharge.       Final diagnoses:  Atrial fibrillation with rapid ventricular response [facility])    ED Discharge Orders          Ordered    Ambulatory referral to Cardiology       Comments: If you have not heard from the Cardiology office within the next 72 hours please call [phone].   04/10/24 1839               Emil Share, DO 04/10/24 1903

## 2024-04-10 NOTE — Discharge Instructions (Signed)
 Follow up with your PCP and cardiologist in the office.   Take tylenol  2 pills 4 times a day.  Drink plenty of fluids.  Return for worsening shortness of breath, headache, confusion. Follow up with your family doctor.

## 2024-04-11 DIAGNOSIS — I4891 Unspecified atrial fibrillation: Secondary | ICD-10-CM | POA: Diagnosis not present

## 2024-04-14 DIAGNOSIS — E1122 Type 2 diabetes mellitus with diabetic chronic kidney disease: Secondary | ICD-10-CM | POA: Diagnosis not present

## 2024-04-14 DIAGNOSIS — N179 Acute kidney failure, unspecified: Secondary | ICD-10-CM | POA: Diagnosis not present

## 2024-04-14 DIAGNOSIS — I130 Hypertensive heart and chronic kidney disease with heart failure and stage 1 through stage 4 chronic kidney disease, or unspecified chronic kidney disease: Secondary | ICD-10-CM | POA: Diagnosis not present

## 2024-04-14 DIAGNOSIS — E785 Hyperlipidemia, unspecified: Secondary | ICD-10-CM | POA: Diagnosis not present

## 2024-04-14 DIAGNOSIS — I272 Pulmonary hypertension, unspecified: Secondary | ICD-10-CM | POA: Diagnosis not present

## 2024-04-14 DIAGNOSIS — E1169 Type 2 diabetes mellitus with other specified complication: Secondary | ICD-10-CM | POA: Diagnosis not present

## 2024-04-14 DIAGNOSIS — N1831 Chronic kidney disease, stage 3a: Secondary | ICD-10-CM | POA: Diagnosis not present

## 2024-04-14 DIAGNOSIS — E1136 Type 2 diabetes mellitus with diabetic cataract: Secondary | ICD-10-CM | POA: Diagnosis not present

## 2024-04-14 DIAGNOSIS — I4821 Permanent atrial fibrillation: Secondary | ICD-10-CM | POA: Diagnosis not present

## 2024-04-14 DIAGNOSIS — E871 Hypo-osmolality and hyponatremia: Secondary | ICD-10-CM | POA: Diagnosis not present

## 2024-04-14 DIAGNOSIS — J441 Chronic obstructive pulmonary disease with (acute) exacerbation: Secondary | ICD-10-CM | POA: Diagnosis not present

## 2024-04-14 DIAGNOSIS — I5032 Chronic diastolic (congestive) heart failure: Secondary | ICD-10-CM | POA: Diagnosis not present

## 2024-04-14 DIAGNOSIS — I080 Rheumatic disorders of both mitral and aortic valves: Secondary | ICD-10-CM | POA: Diagnosis not present

## 2024-04-15 LAB — CULTURE, BLOOD (ROUTINE X 2)
Culture: NO GROWTH
Culture: NO GROWTH
Special Requests: ADEQUATE
Special Requests: ADEQUATE

## 2024-04-23 ENCOUNTER — Ambulatory Visit (INDEPENDENT_AMBULATORY_CARE_PROVIDER_SITE_OTHER): Admitting: Physician Assistant

## 2024-04-23 ENCOUNTER — Encounter: Payer: Self-pay | Admitting: Physician Assistant

## 2024-04-23 DIAGNOSIS — F439 Reaction to severe stress, unspecified: Secondary | ICD-10-CM

## 2024-04-23 DIAGNOSIS — Z9151 Personal history of suicidal behavior: Secondary | ICD-10-CM | POA: Diagnosis not present

## 2024-04-23 DIAGNOSIS — F17200 Nicotine dependence, unspecified, uncomplicated: Secondary | ICD-10-CM | POA: Diagnosis not present

## 2024-04-23 DIAGNOSIS — F4323 Adjustment disorder with mixed anxiety and depressed mood: Secondary | ICD-10-CM | POA: Diagnosis not present

## 2024-04-23 MED ORDER — DULOXETINE HCL 60 MG PO CPEP: 120.0000 mg | ORAL_CAPSULE | Freq: Every day | ORAL | 0 refills | Status: DC
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
## 2024-04-28 DIAGNOSIS — E1165 Type 2 diabetes mellitus with hyperglycemia: Secondary | ICD-10-CM | POA: Diagnosis not present

## 2024-04-28 DIAGNOSIS — Z23 Encounter for immunization: Secondary | ICD-10-CM | POA: Diagnosis not present

## 2024-04-28 DIAGNOSIS — N1831 Chronic kidney disease, stage 3a: Secondary | ICD-10-CM | POA: Diagnosis not present

## 2024-04-28 DIAGNOSIS — E782 Mixed hyperlipidemia: Secondary | ICD-10-CM | POA: Diagnosis not present

## 2024-04-28 DIAGNOSIS — I10 Essential (primary) hypertension: Secondary | ICD-10-CM | POA: Diagnosis not present

## 2024-04-28 DIAGNOSIS — F17200 Nicotine dependence, unspecified, uncomplicated: Secondary | ICD-10-CM | POA: Diagnosis not present

## 2024-04-28 DIAGNOSIS — J449 Chronic obstructive pulmonary disease, unspecified: Secondary | ICD-10-CM | POA: Diagnosis not present

## 2024-04-28 DIAGNOSIS — I4821 Permanent atrial fibrillation: Secondary | ICD-10-CM | POA: Diagnosis not present

## 2024-04-28 DIAGNOSIS — M5416 Radiculopathy, lumbar region: Secondary | ICD-10-CM | POA: Diagnosis not present

## 2024-04-29 ENCOUNTER — Encounter: Payer: Self-pay | Admitting: *Deleted

## 2024-04-29 NOTE — Progress Notes (Signed)
 "     OFFICE NOTE:    Date:  04/30/2024  ID:  Rock ORN Rens, DOB 1945-12-24, MRN [id number] PCP: Regino Slater, MD  [facility] Providers Cardiologist:  Wilbert Bihari, MD       Remote hx of MI (Records from [facility]: no CAD by cath in 2016) MPI 02/01/16: no ischemia, EF 65 MPI 02/19/2024: No ischemia or infarction, EF 50, low risk Permanent atrial fibrillation  Dig stopped 10/2023 due to bradycardia  HFimpEF (heart failure with improved ejection fraction)   Mild mitral regurgitation Mod aortic stenosis  TTE 10/03/2020: EF 40-45, mild MR, mild-moderate aortic stenosis (mean gradient 14.5, V-max 246 cm/s, DI 0.44) TTE 09/24/23: EF 60-65, no RWMA, NL RVSF, mod pulmonary HTN, RVSP 45.9, mild BAE, no MR, mild MS (mean MV gradient 6 mmHg), severe AV Ca2+, mild AI, mod AS (mean AV gradient 33 mmHg, Vmax 357.6 cm/s, DI 0.48) Right Bundle Branch Block Hyperlipidemia  Chronic Obstructive Pulmonary Disease Pulmonary hypertension  LHC 09/2014 ([place]): PASP 44 mmHg TTE 09/2023: RVSP 45.9 Rheumatoid arthritis       Discussed the use of AI scribe software for clinical note transcription with the patient, who gave verbal consent to proceed. History of Present Illness Theresa Mendoza is a 78 y.o. female who returns for post hospital follow up. She was last seen 04/02/24. She was sent to the ED for evaluation due to worsening shortness of breath and AF w RVR. She was admitted 9/17-9/20 by the internal medicine service. She was rate controlled initially with IV Dilt. She was tx for AECOPD with inhaled bronchodilators and inhaled steroids. Recommendation was for pulmonology referral as an OP. She was diuresed with IV Lasix . Hospital stay was c/b AKI with peak SCr 1.40.   During her admission, she was evaluated by Dr. Almetta with EP. It was felt the pt is in longstanding persistent atrial fibrillation. She has had variable rate control. It was not clear if her shortness of breath and malaise is due to  COPD, AFib +/- RVR or a mix of both. 3 options were discussed: (1) Continue current AVN blocking agents which have not provided adequate rate control in the past, (2) AAD therapy with Amiodarone  being the only reasonable option which poses potential issues with baseline pulmonary disease or (3) AVN ablation + PPM implantation. The pt did not want any procedures and also did not want to risk worsening lung disease with Amiodarone . It was felt she would not maintain NSR w/o AAD. She opted for rate control Rx only.   She was seen in the ED 04/10/24 w AF w RVR. Notes indicate she was given Dilt IV 20 mg x 1. Her HR improved and she felt better. She was DC to home. Resp panel was neg. SCr, K+, Mg, and Hgb were normal. Lactate was normal. CXR was normal. Bld Cx's x 2 were neg.   She is accompanied by her son, [name]. She describes feeling 'real heavy' in the chest without pain and is experiencing difficulty breathing. Her son notes that she felt fine yesterday but felt weak this morning and progressively worse as she arrived at the office. She became sweaty and hot during the EKG, with clothes soaked in sweat. She feels nauseated and 'really bad,' similar to when she had a heart attack. No current chest pain but she reports feeling sicker and more nauseated than usual. She has an inhaler at home, which she does not use regularly, and she refuses to take prednisone   due to adverse effects. She has an oxygen  generator at home but does not use it regularly. Her son notes that her stomach issues have preceded her episodes of atrial fibrillation in the past.    ROS-See HPI    Studies Reviewed:  EKG Interpretation Date/Time:  Wednesday April 30 2024 15:30:08 EDT Ventricular Rate:  132 PR Interval:    QRS Duration:  138 QT Interval:  364 QTC Calculation: 539 R Axis:   -82  Text Interpretation: Atrial fibrillation with rapid ventricular response Left axis deviation Right bundle branch block Minimal voltage  criteria for LVH, may be normal variant No significant change since last tracing Confirmed by Lelon Hamilton [id number]) on 04/30/2024 3:33:41 PM    04/02/24: TSH 0.839  04/05/24: SCr 1.23 04/10/24: K 3.6, SCr 0.87, eGFR > 60, Alb 3, ALT 21, TP 5.7, Ca 8.4, WBC 12.2 (<< 17.6), Hgb 12.1, MCV 77.2, PLT 405K 04/28/24: Hgb 14, Plt 581K, SCr 1.23, K 6, Na 138, alb 4.5, ALT 10  Risk Assessment/Calculations: CHA2DS2-VASc Score = 6   This indicates a 9.7% annual risk of stroke. The patient's score is based upon: CHF History: 1 HTN History: 1 Diabetes History: 0 Stroke History: 0 Vascular Disease History: 1 Age Score: 2 Gender Score: 1            Physical Exam:  VS:  BP 122/72 (BP Location: Left Arm, Patient Position: Sitting, Cuff Size: Normal)   Pulse (!) 132   Ht 5' 5 (1.651 m)   Wt 158 lb (71.7 kg)   BMI 26.29 kg/m        Wt Readings from Last 3 Encounters:  04/30/24 158 lb (71.7 kg)  04/03/24 (P) 164 lb 0.4 oz (74.4 kg)  04/02/24 160 lb 12.8 oz (72.9 kg)    Constitutional:      Appearance: Chronically ill-appearing.  Neck:     Vascular: JVD normal.  Pulmonary:     Breath sounds: Diffuse Wheezing present. No rales.  Cardiovascular:     Tachycardia present. Irregularly irregular rhythm.     Murmurs: There is no murmur.  Edema:    Peripheral edema absent.  Abdominal:     Palpations: Abdomen is soft.     Tenderness: There is abdominal tenderness (RUQ).  Skin:    General: Skin is moist.       Assessment and Plan:    Assessment & Plan Atrial fibrillation with RVR (HCC) She has had multiple admissions with uncontrolled rate with atrial fibrillation. She was taken off of Digoxin  in the past due to symptomatic bradycardia. She was admitted again in Sept 2025 with uncontrolled rate in the setting of possible AECOPD. She is being referred to pulmonology. She was seen by Dr. Almetta in the hospital who had a long discussion with her on 2 occasions to discussion management  options. This included rate controlling Rx, AAD therapy or AV nodal ablation + PPM. The patient opted for rate control Rx. She was seen several days after DC in the ED with AF w RVR. Her rate improved with one dose of IV Diltiazem . Today, she has a HR in the 130s. She again feels poorly with chest pain, nausea, worsening shortness of breath and is diaphoretic. We reviewed her options for management including adding prn short acting diltiazem , amiodarone  for rate control with an eye towards rhythm control, follow up with Dr. Almetta in the office or referral back to the emergency room. I favor referral back to the emergency based upon how she  currently feels and looks. I explained to her that going back to the hospital will also allow us  to expedite AV nodal ablation and pacemaker implantation. I reviewed this with Dr. Francyne (attending MD) who agreed. The patient was hesitant to pursue this route. But after discussion with her and her son further, she agrees. - Transport to [facility] ED via EMS. - I have notified charge nurse at the ED that pt will need to be admitted by Int Med - I have notified our team that the patient needs EP to see her tomorrow to arrange AVN ablation and pacemaker implant - Continue Eliquis  5 mg twice daily - Rate control per admitting team, EP Heart failure with improved ejection fraction (HFimpEF) (HCC) EF was 40-45 in March 2022.  This improved to 60-65 by echocardiogram March 2025.  Volume status currently seems stable.  - Continue Lasix  20 mg every other day Coronary artery disease involving native coronary artery of native heart without angina pectoris Remote history of myocardial infarction.  Recent nuclear stress test during admission in 02/2024 was neg for ischemia.  Recent admission with AF w RVR with mildly elevated hsTrops that were flat and not c/w ACS. She is having chest pain which is likely related to COPD, AF w RVR. She will need workup in the ED with serial  hsTroponins.  Nonrheumatic aortic valve stenosis Moderate aortic stenosis by echocardiogram March 2025 with mean gradient 33 mmHg V-max 357.6 cm/s.  Chronic obstructive pulmonary disease, unspecified COPD type (HCC) She continues to smoke.  She is being referred to Pulmonology by primary care.          Dispo:  Return for Follow up After Dischage from Hospital.  Signed, Glendia Ferrier, PA-C   "

## 2024-04-30 ENCOUNTER — Ambulatory Visit: Attending: Cardiovascular Disease | Admitting: Physician Assistant

## 2024-04-30 ENCOUNTER — Inpatient Hospital Stay (HOSPITAL_COMMUNITY)
Admission: EM | Admit: 2024-04-30 | Discharge: 2024-05-03 | DRG: 309 | Disposition: A | Discharge status: Home Health | Source: Ambulatory Visit | Attending: Hospitalist | Admitting: Hospitalist

## 2024-04-30 ENCOUNTER — Emergency Department (HOSPITAL_COMMUNITY)

## 2024-04-30 ENCOUNTER — Encounter: Payer: Self-pay | Admitting: Physician Assistant

## 2024-04-30 ENCOUNTER — Other Ambulatory Visit: Payer: Self-pay

## 2024-04-30 VITALS — BP 122/72 | HR 132 | Ht 65.0 in | Wt 158.0 lb

## 2024-04-30 DIAGNOSIS — F17200 Nicotine dependence, unspecified, uncomplicated: Secondary | ICD-10-CM | POA: Diagnosis not present

## 2024-04-30 DIAGNOSIS — N1831 Chronic kidney disease, stage 3a: Secondary | ICD-10-CM | POA: Diagnosis not present

## 2024-04-30 DIAGNOSIS — I10 Essential (primary) hypertension: Principal | ICD-10-CM

## 2024-04-30 DIAGNOSIS — K219 Gastro-esophageal reflux disease without esophagitis: Secondary | ICD-10-CM | POA: Diagnosis present

## 2024-04-30 DIAGNOSIS — I350 Nonrheumatic aortic (valve) stenosis: Secondary | ICD-10-CM | POA: Diagnosis not present

## 2024-04-30 DIAGNOSIS — F319 Bipolar disorder, unspecified: Secondary | ICD-10-CM | POA: Diagnosis present

## 2024-04-30 DIAGNOSIS — I5032 Chronic diastolic (congestive) heart failure: Secondary | ICD-10-CM | POA: Diagnosis present

## 2024-04-30 DIAGNOSIS — I252 Old myocardial infarction: Secondary | ICD-10-CM

## 2024-04-30 DIAGNOSIS — I452 Bifascicular block: Secondary | ICD-10-CM | POA: Diagnosis present

## 2024-04-30 DIAGNOSIS — F419 Anxiety disorder, unspecified: Secondary | ICD-10-CM | POA: Diagnosis present

## 2024-04-30 DIAGNOSIS — I5020 Unspecified systolic (congestive) heart failure: Secondary | ICD-10-CM | POA: Diagnosis not present

## 2024-04-30 DIAGNOSIS — Z820 Family history of epilepsy and other diseases of the nervous system: Secondary | ICD-10-CM

## 2024-04-30 DIAGNOSIS — E1122 Type 2 diabetes mellitus with diabetic chronic kidney disease: Secondary | ICD-10-CM | POA: Diagnosis present

## 2024-04-30 DIAGNOSIS — D6869 Other thrombophilia: Secondary | ICD-10-CM | POA: Diagnosis present

## 2024-04-30 DIAGNOSIS — I2510 Atherosclerotic heart disease of native coronary artery without angina pectoris: Secondary | ICD-10-CM | POA: Diagnosis not present

## 2024-04-30 DIAGNOSIS — Z7901 Long term (current) use of anticoagulants: Secondary | ICD-10-CM

## 2024-04-30 DIAGNOSIS — M069 Rheumatoid arthritis, unspecified: Secondary | ICD-10-CM | POA: Diagnosis present

## 2024-04-30 DIAGNOSIS — Z66 Do not resuscitate: Secondary | ICD-10-CM | POA: Diagnosis present

## 2024-04-30 DIAGNOSIS — Z831 Family history of other infectious and parasitic diseases: Secondary | ICD-10-CM

## 2024-04-30 DIAGNOSIS — Z7984 Long term (current) use of oral hypoglycemic drugs: Secondary | ICD-10-CM | POA: Diagnosis not present

## 2024-04-30 DIAGNOSIS — Z801 Family history of malignant neoplasm of trachea, bronchus and lung: Secondary | ICD-10-CM

## 2024-04-30 DIAGNOSIS — J449 Chronic obstructive pulmonary disease, unspecified: Secondary | ICD-10-CM | POA: Diagnosis not present

## 2024-04-30 DIAGNOSIS — Z818 Family history of other mental and behavioral disorders: Secondary | ICD-10-CM

## 2024-04-30 DIAGNOSIS — I080 Rheumatic disorders of both mitral and aortic valves: Secondary | ICD-10-CM | POA: Diagnosis present

## 2024-04-30 DIAGNOSIS — Z79899 Other long term (current) drug therapy: Secondary | ICD-10-CM

## 2024-04-30 DIAGNOSIS — F17210 Nicotine dependence, cigarettes, uncomplicated: Secondary | ICD-10-CM | POA: Diagnosis present

## 2024-04-30 DIAGNOSIS — I495 Sick sinus syndrome: Secondary | ICD-10-CM | POA: Diagnosis present

## 2024-04-30 DIAGNOSIS — E785 Hyperlipidemia, unspecified: Secondary | ICD-10-CM | POA: Diagnosis present

## 2024-04-30 DIAGNOSIS — I130 Hypertensive heart and chronic kidney disease with heart failure and stage 1 through stage 4 chronic kidney disease, or unspecified chronic kidney disease: Secondary | ICD-10-CM | POA: Diagnosis present

## 2024-04-30 DIAGNOSIS — I4891 Unspecified atrial fibrillation: Secondary | ICD-10-CM

## 2024-04-30 DIAGNOSIS — E1165 Type 2 diabetes mellitus with hyperglycemia: Secondary | ICD-10-CM | POA: Diagnosis present

## 2024-04-30 DIAGNOSIS — I272 Pulmonary hypertension, unspecified: Secondary | ICD-10-CM | POA: Diagnosis present

## 2024-04-30 DIAGNOSIS — G43909 Migraine, unspecified, not intractable, without status migrainosus: Secondary | ICD-10-CM | POA: Diagnosis present

## 2024-04-30 DIAGNOSIS — I482 Chronic atrial fibrillation, unspecified: Secondary | ICD-10-CM | POA: Diagnosis not present

## 2024-04-30 DIAGNOSIS — I4821 Permanent atrial fibrillation: Secondary | ICD-10-CM | POA: Diagnosis not present

## 2024-04-30 DIAGNOSIS — I4811 Longstanding persistent atrial fibrillation: Secondary | ICD-10-CM | POA: Diagnosis not present

## 2024-04-30 DIAGNOSIS — Z950 Presence of cardiac pacemaker: Secondary | ICD-10-CM

## 2024-04-30 DIAGNOSIS — Z90710 Acquired absence of both cervix and uterus: Secondary | ICD-10-CM

## 2024-04-30 DIAGNOSIS — Z888 Allergy status to other drugs, medicaments and biological substances status: Secondary | ICD-10-CM

## 2024-04-30 LAB — I-STAT CHEM 8, ED
BUN: 28 mg/dL — ABNORMAL HIGH (ref 8–23)
Calcium, Ion: 1.08 mmol/L — ABNORMAL LOW (ref 1.15–1.40)
Chloride: 102 mmol/L (ref 98–111)
Creatinine, Ser: 1.1 mg/dL — ABNORMAL HIGH (ref 0.44–1.00)
Glucose, Bld: 121 mg/dL — ABNORMAL HIGH (ref 70–99)
HCT: 47 % — ABNORMAL HIGH (ref 36.0–46.0)
Hemoglobin: 16 g/dL — ABNORMAL HIGH (ref 12.0–15.0)
Potassium: 4.5 mmol/L (ref 3.5–5.1)
Sodium: 136 mmol/L (ref 135–145)
TCO2: 24 mmol/L (ref 22–32)

## 2024-04-30 LAB — CBC WITH DIFFERENTIAL/PLATELET
Abs Immature Granulocytes: 0.05 K/uL (ref 0.00–0.07)
Basophils Absolute: 0.1 K/uL (ref 0.0–0.1)
Basophils Relative: 1 %
Eosinophils Absolute: 0.2 K/uL (ref 0.0–0.5)
Eosinophils Relative: 2 %
HCT: 46.2 % — ABNORMAL HIGH (ref 36.0–46.0)
Hemoglobin: 13.9 g/dL (ref 12.0–15.0)
Immature Granulocytes: 1 %
Lymphocytes Relative: 25 %
Lymphs Abs: 2.5 K/uL (ref 0.7–4.0)
MCH: 22.5 pg — ABNORMAL LOW (ref 26.0–34.0)
MCHC: 30.1 g/dL (ref 30.0–36.0)
MCV: 74.9 fL — ABNORMAL LOW (ref 80.0–100.0)
Monocytes Absolute: 1.2 K/uL — ABNORMAL HIGH (ref 0.1–1.0)
Monocytes Relative: 12 %
Neutro Abs: 5.7 K/uL (ref 1.7–7.7)
Neutrophils Relative %: 59 %
Platelets: 573 K/uL — ABNORMAL HIGH (ref 150–400)
RBC: 6.17 MIL/uL — ABNORMAL HIGH (ref 3.87–5.11)
RDW: 19.5 % — ABNORMAL HIGH (ref 11.5–15.5)
WBC: 9.8 K/uL (ref 4.0–10.5)
nRBC: 0 % (ref 0.0–0.2)

## 2024-04-30 LAB — COMPREHENSIVE METABOLIC PANEL WITH GFR
ALT: 12 U/L (ref 0–44)
AST: 16 U/L (ref 15–41)
Albumin: 4 g/dL (ref 3.5–5.0)
Alkaline Phosphatase: 97 U/L (ref 38–126)
Anion gap: 11 (ref 5–15)
BUN: 25 mg/dL — ABNORMAL HIGH (ref 8–23)
CO2: 23 mmol/L (ref 22–32)
Calcium: 9.1 mg/dL (ref 8.9–10.3)
Chloride: 100 mmol/L (ref 98–111)
Creatinine, Ser: 1.13 mg/dL — ABNORMAL HIGH (ref 0.44–1.00)
GFR, Estimated: 50 mL/min — ABNORMAL LOW (ref >60)
Glucose, Bld: 127 mg/dL — ABNORMAL HIGH (ref 70–99)
Potassium: 4.4 mmol/L (ref 3.5–5.1)
Sodium: 134 mmol/L — ABNORMAL LOW (ref 135–145)
Total Bilirubin: 0.8 mg/dL (ref 0.0–1.2)
Total Protein: 6.7 g/dL (ref 6.5–8.1)

## 2024-04-30 LAB — BRAIN NATRIURETIC PEPTIDE: B Natriuretic Peptide: 329.4 pg/mL — ABNORMAL HIGH (ref 0.0–100.0)

## 2024-04-30 LAB — TROPONIN I (HIGH SENSITIVITY)
Troponin I (High Sensitivity): 19 ng/L — ABNORMAL HIGH (ref <18)
Troponin I (High Sensitivity): 18 ng/L — ABNORMAL HIGH (ref <18)

## 2024-04-30 LAB — MAGNESIUM: Magnesium: 2.2 mg/dL (ref 1.7–2.4)

## 2024-04-30 MED ORDER — HYDROXYZINE HCL 25 MG PO TABS
25.0000 mg | ORAL_TABLET | Freq: Three times a day (TID) | ORAL | Status: DC | PRN
  Administered 2024-04-30 – 2024-05-02 (×6): 25 mg via ORAL
  Filled 2024-04-30 (×6): qty 1

## 2024-04-30 MED ORDER — SODIUM CHLORIDE 0.9 % IV BOLUS
1000.0000 mL | Freq: Once | INTRAVENOUS | Status: AC
  Administered 2024-04-30: 1000 mL via INTRAVENOUS

## 2024-04-30 MED ORDER — HYDROCODONE-ACETAMINOPHEN 10-325 MG PO TABS
1.0000 | ORAL_TABLET | Freq: Three times a day (TID) | ORAL | Status: DC | PRN
  Administered 2024-04-30 – 2024-05-02 (×5): 1 via ORAL
  Filled 2024-04-30 (×5): qty 1

## 2024-04-30 MED ORDER — IPRATROPIUM-ALBUTEROL 0.5-2.5 (3) MG/3ML IN SOLN: 3.0000 mL | Freq: Four times a day (QID) | RESPIRATORY_TRACT | Status: DC | PRN

## 2024-04-30 MED ORDER — PANTOPRAZOLE SODIUM 40 MG PO TBEC
40.0000 mg | DELAYED_RELEASE_TABLET | Freq: Every day | ORAL | Status: DC
  Administered 2024-04-30 – 2024-05-03 (×4): 40 mg via ORAL
  Filled 2024-04-30 (×4): qty 1

## 2024-04-30 MED ORDER — FUROSEMIDE 20 MG PO TABS
20.0000 mg | ORAL_TABLET | ORAL | Status: DC
  Administered 2024-05-01 – 2024-05-03 (×2): 20 mg via ORAL
  Filled 2024-04-30 (×2): qty 1

## 2024-04-30 MED ORDER — DILTIAZEM HCL-DEXTROSE 125-5 MG/125ML-% IV SOLN (PREMIX)
5.0000 mg/h | INTRAVENOUS | Status: AC
  Administered 2024-04-30 – 2024-05-01 (×2): 10 mg/h via INTRAVENOUS
  Filled 2024-04-30 (×2): qty 125

## 2024-04-30 MED ORDER — ONDANSETRON HCL 4 MG/2ML IJ SOLN
4.0000 mg | Freq: Once | INTRAMUSCULAR | Status: AC
  Administered 2024-04-30: 4 mg via INTRAVENOUS
  Filled 2024-04-30: qty 2

## 2024-04-30 MED ORDER — SENNOSIDES-DOCUSATE SODIUM 8.6-50 MG PO TABS: 1.0000 | ORAL_TABLET | Freq: Every evening | ORAL | Status: DC | PRN

## 2024-04-30 MED ORDER — DULOXETINE HCL 60 MG PO CPEP
120.0000 mg | ORAL_CAPSULE | Freq: Every day | ORAL | Status: DC
  Administered 2024-04-30 – 2024-05-03 (×4): 120 mg via ORAL
  Filled 2024-04-30 (×4): qty 2

## 2024-04-30 MED ORDER — APIXABAN 5 MG PO TABS
5.0000 mg | ORAL_TABLET | Freq: Two times a day (BID) | ORAL | Status: DC
  Administered 2024-04-30 – 2024-05-01 (×2): 5 mg via ORAL
  Filled 2024-04-30 (×2): qty 1

## 2024-04-30 MED ORDER — DILTIAZEM LOAD VIA INFUSION
15.0000 mg | Freq: Once | INTRAVENOUS | Status: AC
  Administered 2024-04-30: 15 mg via INTRAVENOUS
  Filled 2024-04-30: qty 15

## 2024-04-30 MED ORDER — BISACODYL 5 MG PO TBEC: 5.0000 mg | DELAYED_RELEASE_TABLET | Freq: Every day | ORAL | Status: DC | PRN

## 2024-04-30 MED ORDER — IPRATROPIUM-ALBUTEROL 0.5-2.5 (3) MG/3ML IN SOLN
3.0000 mL | Freq: Once | RESPIRATORY_TRACT | Status: AC
  Administered 2024-04-30: 3 mL via RESPIRATORY_TRACT
  Filled 2024-04-30: qty 3

## 2024-04-30 MED ORDER — ONDANSETRON HCL 4 MG/2ML IJ SOLN: 4.0000 mg | Freq: Four times a day (QID) | INTRAMUSCULAR | Status: DC | PRN

## 2024-04-30 MED ORDER — ACETAMINOPHEN 650 MG RE SUPP: 650.0000 mg | Freq: Four times a day (QID) | RECTAL | Status: DC | PRN

## 2024-04-30 MED ORDER — PRAVASTATIN SODIUM 40 MG PO TABS
40.0000 mg | ORAL_TABLET | Freq: Every day | ORAL | Status: DC
  Administered 2024-04-30 – 2024-05-03 (×4): 40 mg via ORAL
  Filled 2024-04-30 (×4): qty 1

## 2024-04-30 MED ORDER — FLUTICASONE FUROATE-VILANTEROL 200-25 MCG/ACT IN AEPB
1.0000 | INHALATION_SPRAY | Freq: Every day | RESPIRATORY_TRACT | Status: DC
  Administered 2024-05-01 – 2024-05-03 (×3): 1 via RESPIRATORY_TRACT
  Filled 2024-04-30: qty 28

## 2024-04-30 MED ORDER — ONDANSETRON HCL 4 MG PO TABS: 4.0000 mg | ORAL_TABLET | Freq: Four times a day (QID) | ORAL | Status: DC | PRN

## 2024-04-30 MED ORDER — ACETAMINOPHEN 325 MG PO TABS: 650.0000 mg | ORAL_TABLET | Freq: Four times a day (QID) | ORAL | Status: DC | PRN

## 2024-04-30 NOTE — Assessment & Plan Note (Addendum)
 Remote history of myocardial infarction.  Recent nuclear stress test during admission in 02/2024 was neg for ischemia.  Recent admission with AF w RVR with mildly elevated hsTrops that were flat and not c/w ACS. She is having chest pain which is likely related to COPD, AF w RVR. She will need workup in the ED with serial hsTroponins.

## 2024-04-30 NOTE — Assessment & Plan Note (Addendum)
 She has had multiple admissions with uncontrolled rate with atrial fibrillation. She was taken off of Digoxin  in the past due to symptomatic bradycardia. She was admitted again in Sept 2025 with uncontrolled rate in the setting of possible AECOPD. She is being referred to pulmonology. She was seen by Dr. Almetta in the hospital who had a long discussion with her on 2 occasions to discussion management options. This included rate controlling Rx, AAD therapy or AV nodal ablation + PPM. The patient opted for rate control Rx. She was seen several days after DC in the ED with AF w RVR. Her rate improved with one dose of IV Diltiazem . Today, she has a HR in the 130s. She again feels poorly with chest pain, nausea, worsening shortness of breath and is diaphoretic. We reviewed her options for management including adding prn short acting diltiazem , amiodarone  for rate control with an eye towards rhythm control, follow up with Dr. Almetta in the office or referral back to the emergency room. I favor referral back to the emergency based upon how she currently feels and looks. I explained to her that going back to the hospital will also allow us  to expedite AV nodal ablation and pacemaker implantation. I reviewed this with Dr. Francyne (attending MD) who agreed. The patient was hesitant to pursue this route. But after discussion with her and her son further, she agrees. - Transport to [facility] ED via EMS. - I have notified charge nurse at the ED that pt will need to be admitted by Int Med - I have notified our team that the patient needs EP to see her tomorrow to arrange AVN ablation and pacemaker implant - Continue Eliquis  5 mg twice daily - Rate control per admitting team, EP

## 2024-04-30 NOTE — ED Triage Notes (Signed)
 Pt. BIB [facility] from her cardiology appointment; pt. States that she is feeling lightheaded, weak, short if breath, and nauseas. Pt. Has a Hx of A-fib and states that she is compliant with her medications; Pt. Is A/O x4.   VS per [facility]:  BP: 114/50 HR: 146 RR: 22 SpO2: 96%

## 2024-04-30 NOTE — Assessment & Plan Note (Addendum)
 She continues to smoke.  She is being referred to Pulmonology by primary care.

## 2024-04-30 NOTE — Assessment & Plan Note (Addendum)
 EF was 40-45 in March 2022.  This improved to 60-65 by echocardiogram March 2025.  Volume status currently seems stable.  - Continue Lasix  20 mg every other day

## 2024-04-30 NOTE — ED Provider Notes (Signed)
 [place] EMERGENCY DEPARTMENT AT [facility] Provider Note  CSN: [id number] Arrival date & time: 04/30/24 1656  Chief Complaint(s) Shortness of Breath  HPI Theresa Mendoza is a 78 y.o. female history atrial fibrillation, bipolar disorder, COPD, coronary artery disease, pulmonary retention, rheumatoid arthritis, CKD presenting to the emergency department for generalized weakness.  Patient reports feeling increased weakness over the past couple of days as well as weakness, nausea.  No vomiting.  No chest pain.  Reports mild shortness of breath.  No cough.  Went to cardiology appointment today, found that she was in A-fib with RVR.  She has had a persistent history of this.  She has been on rate control medications but has not really helped.  They recommended she come to the hospital for admission for ablation and PPM placement.  Patient was agreeable to this and brought to the hospital by EMS.  Reports she is compliant with her Eliquis  and her other medications.   Past Medical History Past Medical History:  Diagnosis Date   A-fib [facility])    Anxiety    Bipolar 1 disorder (HCC)    Bowel obstruction (HCC)    Cataract    Chronic atrial fibrillation (HCC) 09/2014   COPD (chronic obstructive pulmonary disease) (HCC)    Dyslipidemia, goal LDL below 70 12/25/2015   Encounter for tobacco use cessation counseling 12/04/2017   Glaucoma    Hx of migraine headaches    Memory loss    Mitral regurgitation    mild to moderate on echo 09/2014   Old MI (myocardial infarction) 09/2014   cath with normal coronary arteries and EF 50-55%   Osteoarthritis    Pulmonary HTN (HCC) 09/2014   Mild with PASP at cath   RBBB    Rheumatoid arthritis (HCC)    Tobacco abuse    Patient Active Problem List   Diagnosis Date Noted   Atrial fibrillation with RVR (HCC) 04/02/2024   CKD stage 3a, GFR 45-59 ml/min (HCC) 02/20/2024   Aortic valve disease 02/18/2024   Type 2 diabetes mellitus with  hyperlipidemia (HCC) 02/18/2024   Benign hypertension 02/18/2024   Aortic stenosis 11/06/2023   Stage 3a chronic kidney disease (HCC) 11/06/2023   Chest pain 09/22/2023   Diabetes mellitus type II, non insulin  dependent (HCC) 10/14/2021   COPD exacerbation (HCC) 10/14/2021   Acute exacerbation of chronic obstructive pulmonary disease (COPD) (HCC) 10/13/2021   Chronic combined systolic and diastolic CHF (congestive heart failure) (HCC) 10/13/2021   Depression with anxiety 10/13/2021   Chronic back pain 10/13/2021   Pain due to onychomycosis of toenails of both feet 10/20/2020   Coagulation disorder 10/20/2020   Chronic CHF (congestive heart failure) (HCC)    Essential hypertension    Major depressive disorder, recurrent episode, moderate (HCC) 10/09/2020   Heart failure with improved ejection fraction (HFimpEF) (HCC) 10/07/2020   Encounter for tobacco use cessation counseling 12/04/2017   Chronic respiratory failure with hypoxia (HCC) 06/18/2017   COPD (chronic obstructive pulmonary disease) (HCC) 08/24/2016   Chronic bronchitis (HCC) 08/24/2016   Hypersomnia 08/24/2016   Hyperlipidemia 12/25/2015   CAD (coronary artery disease), native coronary artery 12/24/2015   Old MI (myocardial infarction) 12/24/2015   Longstanding persistent atrial fibrillation (HCC) 12/24/2015   Cognitive and behavioral changes 12/15/2015   Tobacco use disorder 12/15/2015   Home Medication(s) Prior to Admission medications   Medication Sig Start Date End Date Taking? Authorizing Provider  apixaban  (ELIQUIS ) 5 MG TABS tablet Take 1 tablet (5  mg total) by mouth 2 (two) times daily. NEEDS CARDIOLOGY APPT FOR ELIQUIS  REFILLS, PLEASE CALL OFFICE 02/20/24   Arrien, Elidia Sieving, MD  diltiazem  (CARDIZEM  CD) 360 MG 24 hr capsule Take 1 capsule (360 mg total) by mouth daily. 04/06/24   Vann, Jessica U, DO  DULoxetine  (CYMBALTA ) 60 MG capsule Take 2 capsules (120 mg total) by mouth daily. 04/23/24   Rhys Boyer T,  PA-C  empagliflozin  (JARDIANCE ) 25 MG TABS tablet Take 1 tablet (25 mg total) by mouth daily. 02/20/24   Arrien, Elidia Sieving, MD  furosemide  (LASIX ) 20 MG tablet Take 20 mg by mouth every other day.    [provider]  HYDROcodone -acetaminophen  (NORCO) 10-325 MG tablet Take 1 tablet by mouth 3 (three) times daily as needed for moderate pain (pain score 4-6). 03/09/24   [provider]  hydrOXYzine  (ATARAX ) 25 MG tablet Take 1 tablet (25 mg total) by mouth 3 (three) times daily as needed for anxiety. 04/05/24   Vann, Jessica U, DO  metFORMIN  (GLUCOPHAGE ) 500 MG tablet Take 1 tablet (500 mg total) by mouth 2 (two) times daily with a meal. 10/15/21 04/30/24  Kathrin Mignon DASEN, MD  metoprolol  succinate (TOPROL -XL) 100 MG 24 hr tablet Take 1 tablet (100 mg total) by mouth daily. Take with or immediately following a meal. 04/06/24   Juvenal Harlene PENNER, DO  nitroGLYCERIN  (NITROSTAT ) 0.4 MG SL tablet Place 1 tablet (0.4 mg total) under the tongue every 5 (five) minutes as needed for chest pain. 09/24/23   Cheryle Page, MD  omeprazole (PRILOSEC) 40 MG capsule Take 40 mg by mouth daily as needed (heartburn). 09/07/20   [provider]  ondansetron  (ZOFRAN ) 4 MG tablet Take 4 mg by mouth every 8 (eight) hours as needed for vomiting or nausea. 02/26/24   [provider]  pravastatin  (PRAVACHOL ) 40 MG tablet Take 1 tablet by mouth daily. 03/17/21   [provider]                                                                                                                                    Past Surgical History Past Surgical History:  Procedure Laterality Date   ADENOIDECTOMY     BACK SURGERY     bowel obstruction     CARDIAC CATHETERIZATION  09/2014   normal coronary arteries with mild pulmonary HTN, low normal LVF   CHOLECYSTECTOMY     NOSE SURGERY     TONSILLECTOMY     TRANSESOPHAGEAL ECHOCARDIOGRAM WITH CARDIOVERSION  09/2014   VAGINAL HYSTERECTOMY     with BSO    Family History Family History  Problem Relation Age of Onset   Alzheimer's disease Mother    Lung cancer Mother    Tuberculosis Father    Depression Father     Social History Social History   Tobacco Use   Smoking status: Every Day    Current packs/day: 1.00  Average packs/day: 1 pack/day for 60.0 years (60.0 ttl pk-yrs)    Types: Cigarettes    Passive exposure: Current   Smokeless tobacco: Never   Tobacco comments:    03/26/2024 Patient smokes a pack daily    Doesn't want to quit. 01/24/2024  Vaping Use   Vaping status: Never Used  Substance Use Topics   Alcohol use: No    Alcohol/week: 0.0 standard drinks of alcohol   Drug use: No   Allergies Lithobid [lithium] and Tegretol [carbamazepine]  Review of Systems Review of Systems  All other systems reviewed and are negative.   Physical Exam Vital Signs  I have reviewed the triage vital signs BP (!) 116/55   Pulse 88   Temp 97.9 F (36.6 C) (Oral)   Resp (!) 28   SpO2 97%  Physical Exam Vitals and nursing note reviewed.  Constitutional:      General: She is not in acute distress.    Appearance: She is well-developed.  HENT:     Head: Normocephalic and atraumatic.     Mouth/Throat:     Mouth: Mucous membranes are moist.  Eyes:     Pupils: Pupils are equal, round, and reactive to light.  Cardiovascular:     Rate and Rhythm: Tachycardia present. Rhythm irregular.     Heart sounds: No murmur heard. Pulmonary:     Effort: Pulmonary effort is normal. Tachypnea present. No respiratory distress.     Breath sounds: Examination of the right-upper field reveals wheezing. Examination of the left-upper field reveals wheezing. Examination of the right-middle field reveals wheezing. Examination of the left-middle field reveals wheezing. Examination of the right-lower field reveals wheezing. Examination of the left-lower field reveals wheezing. Wheezing present.  Abdominal:     General: Abdomen is flat.      Palpations: Abdomen is soft.     Tenderness: There is no abdominal tenderness.  Musculoskeletal:        General: No tenderness.     Right lower leg: No edema.     Left lower leg: No edema.  Skin:    General: Skin is warm and dry.  Neurological:     General: No focal deficit present.     Mental Status: She is alert. Mental status is at baseline.  Psychiatric:        Mood and Affect: Mood normal.        Behavior: Behavior normal.     ED Results and Treatments Labs (all labs ordered are listed, but only abnormal results are displayed) Labs Reviewed  COMPREHENSIVE METABOLIC PANEL WITH GFR - Abnormal; Notable for the following components:      Result Value   Sodium 134 (*)    Glucose, Bld 127 (*)    BUN 25 (*)    Creatinine, Ser 1.13 (*)    GFR, Estimated 50 (*)    All other components within normal limits  CBC WITH DIFFERENTIAL/PLATELET - Abnormal; Notable for the following components:   RBC 6.17 (*)    HCT 46.2 (*)    MCV 74.9 (*)    MCH 22.5 (*)    RDW 19.5 (*)    Platelets 573 (*)    Monocytes Absolute 1.2 (*)    All other components within normal limits  BRAIN NATRIURETIC PEPTIDE - Abnormal; Notable for the following components:   B Natriuretic Peptide 329.4 (*)    All other components within normal limits  I-STAT CHEM 8, ED - Abnormal; Notable for the following components:  BUN 28 (*)    Creatinine, Ser 1.10 (*)    Glucose, Bld 121 (*)    Calcium , Ion 1.08 (*)    Hemoglobin 16.0 (*)    HCT 47.0 (*)    All other components within normal limits  TROPONIN I (HIGH SENSITIVITY) - Abnormal; Notable for the following components:   Troponin I (High Sensitivity) 19 (*)    All other components within normal limits  TROPONIN I (HIGH SENSITIVITY) - Abnormal; Notable for the following components:   Troponin I (High Sensitivity) 18 (*)    All other components within normal limits  MAGNESIUM   BASIC METABOLIC PANEL WITH GFR  CBC                                                                                                                           Radiology DG Chest Portable 1 View Result Date: 04/30/2024 CLINICAL DATA:  weakness EXAM: PORTABLE CHEST - 1 VIEW COMPARISON:  04/10/2024 FINDINGS: Mild elevation of the right hemidiaphragm. No focal airspace consolidation, pleural effusion, or pneumothorax. No cardiomegaly. Tortuous aorta with aortic atherosclerosis. No acute fracture or destructive lesions. Multilevel thoracic osteophytosis. Cholecystectomy clips. IMPRESSION: No acute cardiopulmonary abnormality. Electronically Signed   By: Rogelia Myers M.D.   On: 04/30/2024 17:44    Pertinent labs & imaging results that were available during my care of the patient were reviewed by me and considered in my medical decision making (see MDM for details).  Medications Ordered in ED Medications  diltiazem  (CARDIZEM ) 1 mg/mL load via infusion 15 mg (15 mg Intravenous Bolus from Bag 04/30/24 1758)    And  diltiazem  (CARDIZEM ) 125 mg in dextrose  5% 125 mL (1 mg/mL) infusion (10 mg/hr Intravenous New Bag/Given 04/30/24 1757)  acetaminophen  (TYLENOL ) tablet 650 mg (has no administration in time range)    Or  acetaminophen  (TYLENOL ) suppository 650 mg (has no administration in time range)  senna-docusate (Senokot-S) tablet 1 tablet (has no administration in time range)  bisacodyl (DULCOLAX) EC tablet 5 mg (has no administration in time range)  ondansetron  (ZOFRAN ) tablet 4 mg (has no administration in time range)    Or  ondansetron  (ZOFRAN ) injection 4 mg (has no administration in time range)  ipratropium-albuterol  (DUONEB) 0.5-2.5 (3) MG/3ML nebulizer solution 3 mL (3 mLs Nebulization Given 04/30/24 1759)  sodium chloride  0.9 % bolus 1,000 mL (0 mLs Intravenous Stopped 04/30/24 1858)  ondansetron  (ZOFRAN ) injection 4 mg (4 mg Intravenous Given 04/30/24 1758)  Procedures .Critical Care  Performed by: Francesca Elsie CROME, MD Authorized by: Francesca Elsie CROME, MD   Critical care provider statement:    Critical care time (minutes):  30   Critical care was necessary to treat or prevent imminent or life-threatening deterioration of the following conditions:  Cardiac failure   Critical care was time spent personally by me on the following activities:  Development of treatment plan with patient or surrogate, discussions with consultants, evaluation of patient's response to treatment, examination of patient, ordering and review of laboratory studies, ordering and review of radiographic studies, ordering and performing treatments and interventions, pulse oximetry, re-evaluation of patient's condition and review of old charts   (including critical care time)  Medical Decision Making / ED Course   MDM:  78 year old presenting to the emergency department with weakness.  Patient overall well-appearing, exam notable for mild dyspnea but no distress.  Does have some wheezing on exam.  No leg swelling.  Heart rate is tachycardic.  Symptoms seem consistent with A-fib with RVR, possible contributor of underlying COPD.  She does have some wheezing.  Will give breathing treatment.  Will start on diltiazem .  Will check labs including electrolytes, magnesium .  Given her weakness, nausea and diaphoresis will check troponin but seems more consistent with A-fib with RVR.  Patient is on chronic anticoagulation so doubt other process such as pulmonary embolism.  Patient will need admission for further care.  Cardiology has already been involved with the patient's care today.  Clinical Course as of 04/30/24 2031  Wed Apr 30, 2024  2029 Patient admitted by Dr. Lou  [WS]    Clinical Course User Index [WS] Francesca Elsie CROME, MD     Additional history obtained: -Additional history obtained from ems -External records from outside source  obtained and reviewed including: Chart review including previous notes, labs, imaging, consultation notes including prior notes    Lab Tests: -I ordered, reviewed, and interpreted labs.   The pertinent results include:   Labs Reviewed  COMPREHENSIVE METABOLIC PANEL WITH GFR - Abnormal; Notable for the following components:      Result Value   Sodium 134 (*)    Glucose, Bld 127 (*)    BUN 25 (*)    Creatinine, Ser 1.13 (*)    GFR, Estimated 50 (*)    All other components within normal limits  CBC WITH DIFFERENTIAL/PLATELET - Abnormal; Notable for the following components:   RBC 6.17 (*)    HCT 46.2 (*)    MCV 74.9 (*)    MCH 22.5 (*)    RDW 19.5 (*)    Platelets 573 (*)    Monocytes Absolute 1.2 (*)    All other components within normal limits  BRAIN NATRIURETIC PEPTIDE - Abnormal; Notable for the following components:   B Natriuretic Peptide 329.4 (*)    All other components within normal limits  I-STAT CHEM 8, ED - Abnormal; Notable for the following components:   BUN 28 (*)    Creatinine, Ser 1.10 (*)    Glucose, Bld 121 (*)    Calcium , Ion 1.08 (*)    Hemoglobin 16.0 (*)    HCT 47.0 (*)    All other components within normal limits  TROPONIN I (HIGH SENSITIVITY) - Abnormal; Notable for the following components:   Troponin I (High Sensitivity) 19 (*)    All other components within normal limits  TROPONIN I (HIGH SENSITIVITY) - Abnormal; Notable for the following components:   Troponin I (High Sensitivity)  18 (*)    All other components within normal limits  MAGNESIUM   BASIC METABOLIC PANEL WITH GFR  CBC    Notable for mild troponin   Imaging Studies ordered: I ordered imaging studies including CXR On my interpretation imaging demonstrates no acute process I independently visualized and interpreted imaging. I agree with the radiologist interpretation   Medicines ordered and prescription drug management: Meds ordered this encounter  Medications    ipratropium-albuterol  (DUONEB) 0.5-2.5 (3) MG/3ML nebulizer solution 3 mL   AND Linked Order Group    diltiazem  (CARDIZEM ) 1 mg/mL load via infusion 15 mg    diltiazem  (CARDIZEM ) 125 mg in dextrose  5% 125 mL (1 mg/mL) infusion   sodium chloride  0.9 % bolus 1,000 mL   ondansetron  (ZOFRAN ) injection 4 mg   OR Linked Order Group    acetaminophen  (TYLENOL ) tablet 650 mg    acetaminophen  (TYLENOL ) suppository 650 mg   senna-docusate (Senokot-S) tablet 1 tablet   bisacodyl (DULCOLAX) EC tablet 5 mg   OR Linked Order Group    ondansetron  (ZOFRAN ) tablet 4 mg    ondansetron  (ZOFRAN ) injection 4 mg    -I have reviewed the patients home medicines and have made adjustments as needed   Reevaluation: After the interventions noted above, I reevaluated the patient and found that their symptoms have improved  Co morbidities that complicate the patient evaluation  Past Medical History:  Diagnosis Date   A-fib (HCC)    Anxiety    Bipolar 1 disorder (HCC)    Bowel obstruction (HCC)    Cataract    Chronic atrial fibrillation (HCC) 09/2014   COPD (chronic obstructive pulmonary disease) (HCC)    Dyslipidemia, goal LDL below 70 12/25/2015   Encounter for tobacco use cessation counseling 12/04/2017   Glaucoma    Hx of migraine headaches    Memory loss    Mitral regurgitation    mild to moderate on echo 09/2014   Old MI (myocardial infarction) 09/2014   cath with normal coronary arteries and EF 50-55%   Osteoarthritis    Pulmonary HTN (HCC) 09/2014   Mild with PASP at cath   RBBB    Rheumatoid arthritis (HCC)    Tobacco abuse       Dispostion: Disposition decision including need for hospitalization was considered, and patient discharged from emergency department.    Final Clinical Impression(s) / ED Diagnoses Final diagnoses:  Atrial fibrillation with RVR (HCC)     This chart was dictated using voice recognition software.  Despite best efforts to proofread,  errors can occur  which can change the documentation meaning.    Francesca Elsie CROME, MD 04/30/24 2031

## 2024-04-30 NOTE — Assessment & Plan Note (Addendum)
 Moderate aortic stenosis by echocardiogram March 2025 with mean gradient 33 mmHg V-max 357.6 cm/s.***

## 2024-04-30 NOTE — H&P (Signed)
 History and Physical  Theresa Mendoza FMW:[id number] DOB: Jan 12, 1946 DOA: 04/30/2024  PCP: Regino Slater, MD   Chief Complaint: Shortness of breath, A-fib with RVR  HPI: Theresa Mendoza is a 78 y.o. female with medical history significant for atrial fibrillation on Eliquis , aortic stenosis, pulmonary hypertension, hypertension, hyperlipidemia, CKD3a, HFpEF, rheumatoid arthritis, anxiety/depression/BPD, COPD, glaucoma, migraine headaches, mild cognitive impairment, type 2 diabetes, and chronic back pain who was sent directly from cardiology office for evaluation and management of A-fib with RVR.  Patient reports that over the last few days, she has had increased weakness, fatigue with no energy.  She also endorsed nausea and mild shortness of breath but no vomiting, abdominal pain, cough, chest pain, dizziness, fevers or chills.  She was seen in the cardiology office and found to have A-fib with RVR.  She was advised to present to the ED after agreeing to plan for possible ablation versus pacemaker placement.  ED Course: Initial vitals show patient afebrile, RR 19-27, HR 130s, SBP 110s-130s, SpO2 100% on room air. Initial labs significant for sodium 134, glucose 127, creatinine 1.13, mag 2.2, BNP 329, creatinine 19, WBC 9.8, Hgb 13.9, platelet 573. EKG shows A-fib with RVR, RBBB and LVH. CXR shows no acute abnormalities. Pt received IV NS 1 L bolus, IV Zofran , DuoNeb x 1 and started on Cardizem  drip. TRH was consulted for admission.   Review of Systems: Please see HPI for pertinent positives and negatives. A complete 10 system review of systems are otherwise negative.  Past Medical History:  Diagnosis Date   A-fib [facility])    Anxiety    Bipolar 1 disorder (HCC)    Bowel obstruction (HCC)    Cataract    Chronic atrial fibrillation (HCC) 09/2014   COPD (chronic obstructive pulmonary disease) (HCC)    Dyslipidemia, goal LDL below 70 12/25/2015   Encounter for tobacco use cessation  counseling 12/04/2017   Glaucoma    Hx of migraine headaches    Memory loss    Mitral regurgitation    mild to moderate on echo 09/2014   Old MI (myocardial infarction) 09/2014   cath with normal coronary arteries and EF 50-55%   Osteoarthritis    Pulmonary HTN (HCC) 09/2014   Mild with PASP at cath   RBBB    Rheumatoid arthritis (HCC)    Tobacco abuse    Past Surgical History:  Procedure Laterality Date   ADENOIDECTOMY     BACK SURGERY     bowel obstruction     CARDIAC CATHETERIZATION  09/2014   normal coronary arteries with mild pulmonary HTN, low normal LVF   CHOLECYSTECTOMY     NOSE SURGERY     TONSILLECTOMY     TRANSESOPHAGEAL ECHOCARDIOGRAM WITH CARDIOVERSION  09/2014   VAGINAL HYSTERECTOMY     with BSO   Social History:  reports that she has been smoking cigarettes. She has a 60 pack-year smoking history. She has been exposed to tobacco smoke. She has never used smokeless tobacco. She reports that she does not drink alcohol and does not use drugs.  Allergies  Allergen Reactions   Lithobid [Lithium] Other (See Comments)    Suicidal ideation   Tegretol [Carbamazepine] Other (See Comments)    Melted her skin    Family History  Problem Relation Age of Onset   Alzheimer's disease Mother    Lung cancer Mother    Tuberculosis Father    Depression Father      Prior to Admission medications  Medication Sig Start Date End Date Taking? Authorizing Provider  apixaban  (ELIQUIS ) 5 MG TABS tablet Take 1 tablet (5 mg total) by mouth 2 (two) times daily. NEEDS CARDIOLOGY APPT FOR ELIQUIS  REFILLS, PLEASE CALL OFFICE 02/20/24   Arrien, Elidia Sieving, MD  diltiazem  (CARDIZEM  CD) 360 MG 24 hr capsule Take 1 capsule (360 mg total) by mouth daily. 04/06/24   Vann, Jessica U, DO  DULoxetine  (CYMBALTA ) 60 MG capsule Take 2 capsules (120 mg total) by mouth daily. 04/23/24   Rhys Verneita DASEN, PA-C  empagliflozin  (JARDIANCE ) 25 MG TABS tablet Take 1 tablet (25 mg total) by mouth  daily. 02/20/24   Arrien, Mauricio Daniel, MD  furosemide  (LASIX ) 20 MG tablet Take 20 mg by mouth every other day.    [provider]  HYDROcodone -acetaminophen  (NORCO) 10-325 MG tablet Take 1 tablet by mouth 3 (three) times daily as needed for moderate pain (pain score 4-6). 03/09/24   [provider]  hydrOXYzine  (ATARAX ) 25 MG tablet Take 1 tablet (25 mg total) by mouth 3 (three) times daily as needed for anxiety. 04/05/24   Vann, Jessica U, DO  metFORMIN  (GLUCOPHAGE ) 500 MG tablet Take 1 tablet (500 mg total) by mouth 2 (two) times daily with a meal. 10/15/21 04/30/24  Kathrin Mignon DASEN, MD  metoprolol  succinate (TOPROL -XL) 100 MG 24 hr tablet Take 1 tablet (100 mg total) by mouth daily. Take with or immediately following a meal. 04/06/24   Juvenal Harlene PENNER, DO  nitroGLYCERIN  (NITROSTAT ) 0.4 MG SL tablet Place 1 tablet (0.4 mg total) under the tongue every 5 (five) minutes as needed for chest pain. 09/24/23   Cheryle Page, MD  omeprazole (PRILOSEC) 40 MG capsule Take 40 mg by mouth daily as needed (heartburn). 09/07/20   [provider]  ondansetron  (ZOFRAN ) 4 MG tablet Take 4 mg by mouth every 8 (eight) hours as needed for vomiting or nausea. 02/26/24   [provider]  pravastatin  (PRAVACHOL ) 40 MG tablet Take 1 tablet by mouth daily. 03/17/21   [provider]    Physical Exam: BP (!) 116/55   Pulse 88   Temp 97.8 F (36.6 C) (Oral)   Resp (!) 28   SpO2 97%  General: Pleasant, well-appearing elderly woman laying in bed. No acute distress. HEENT: [place]/AT. Anicteric sclera CV: Tachycardia. Irregularly irregular rhythm. No murmurs, rubs, or gallops. No LE edema Pulmonary: Lungs CTAB. Normal effort. Occasional wheezes at the upper lung fields. Abdominal: Soft, nontender, nondistended. Normal bowel sounds. Extremities: Palpable radial and DP pulses. Normal ROM. Skin: Warm and dry. No obvious rash or lesions. Neuro: A&Ox3. Moves all extremities. Normal  sensation to light touch. No focal deficit. Psych: Normal mood and affect          Labs on Admission:  Basic Metabolic Panel: Recent Labs  Lab 04/30/24 1724 04/30/24 1736 04/30/24 1756  NA 134*  --  136  K 4.4  --  4.5  CL 100  --  102  CO2 23  --   --   GLUCOSE 127*  --  121*  BUN 25*  --  28*  CREATININE 1.13*  --  1.10*  CALCIUM  9.1  --   --   MG  --  2.2  --    Liver Function Tests: Recent Labs  Lab 04/30/24 1724  AST 16  ALT 12  ALKPHOS 97  BILITOT 0.8  PROT 6.7  ALBUMIN 4.0   No results for input(s): LIPASE, AMYLASE in the last 168 hours.  No results for input(s): AMMONIA in the last 168 hours. CBC: Recent Labs  Lab 04/30/24 1724 04/30/24 1756  WBC 9.8  --   NEUTROABS 5.7  --   HGB 13.9 16.0*  HCT 46.2* 47.0*  MCV 74.9*  --   PLT 573*  --    Cardiac Enzymes: No results for input(s): CKTOTAL, CKMB, CKMBINDEX, TROPONINI in the last 168 hours. BNP (last 3 results) Recent Labs    02/15/24 0033 04/02/24 1249 04/30/24 1724  BNP 376.4* 169.0* 329.4*    ProBNP (last 3 results) No results for input(s): PROBNP in the last 8760 hours.  CBG: No results for input(s): GLUCAP in the last 168 hours.  Radiological Exams on Admission: DG Chest Portable 1 View Result Date: 04/30/2024 CLINICAL DATA:  weakness EXAM: PORTABLE CHEST - 1 VIEW COMPARISON:  04/10/2024 FINDINGS: Mild elevation of the right hemidiaphragm. No focal airspace consolidation, pleural effusion, or pneumothorax. No cardiomegaly. Tortuous aorta with aortic atherosclerosis. No acute fracture or destructive lesions. Multilevel thoracic osteophytosis. Cholecystectomy clips. IMPRESSION: No acute cardiopulmonary abnormality. Electronically Signed   By: Rogelia Myers M.D.   On: 04/30/2024 17:44   Assessment/Plan Theresa Mendoza is a 78 y.o. female with medical history significant for trial fibrillation on Eliquis , aortic stenosis, pulmonary hypertension, hypertension,  hyperlipidemia, CKD3a, HFpEF, rheumatoid arthritis, anxiety/depression/BPD, COPD, glaucoma, migraine headaches, mild cognitive impairment, type 2 diabetes, and chronic back pain who was sent directly from cardiology office for evaluation and management of A-fib with RVR.   # A-fib with RVR - Patient with permanent A-fib with multiple ED visits and hospitalizations for A-fib with RVR - Previously declined pacemaker placement however patient agreeable after discussing with cardiology in the outpatient today - HR improved today 90-100s, SBP holding steady in the 120-130s - Continue diltiazem  drip, with close monitoring of BP - Cardiology following, plan for EP evaluation tomorrow for possible PPM versus ablation - Continue Eliquis  - Follow-up TSH - Telemetry  # COPD - Reported mild shortness of breath but nothing active exacerbation - Start daily ICS-LABA therapy - As needed DuoNebs - Follow-up with pulmonology in the outpatient  # T2DM - Last A1c 7.6% 7 months ago - SSI with meals, CBG monitoring   # HFimEF # Aortic stenosis - EF 40-45% in 2022, repeat TTE on 09/2023 with improved EF 60-65% with RVSP 45.9 mmHg, LA and RA with mild dilatation, mild mitral valve stenosis and moderate aortic valve stenosis  - Patient euvolemic on exam - Continue Lasix   # CKD 3A - Creatinine of 1.13 stable compared to baseline - Trend renal function, avoid nephrotoxic agents  # Anxiety and depression # Bipolar disorder - Continue Cymbalta  and as needed hydroxyzine   # CAD # HLD - Continue pravastatin   # GERD - Continue PPI  # Tobacco use disorder - Patient reports smoking half a pack of cigarettes per day, down from more than a pack - I have discussed tobacco cessation with the patient.  I have counseled the patient regarding the negative impacts of continued tobacco use including but not limited to lung cancer, worsening of her COPD, and cardiovascular disease.  I have discussed alternatives to  tobacco and modalities that may help facilitate tobacco cessation including but not limited to medications.   - Reports she is not ready to quit and does not need any nicotine  patch during the hospitalization Total time spent with tobacco counseling was 4 minutes.   DVT prophylaxis: Eliquis     Code Status: Limited: Do not attempt resuscitation (  DNR) -DNR-LIMITED -Do Not Intubate/DNI   Consults called: Cardiology  Family Communication: No family at bedside  Severity of Illness: The appropriate patient status for this patient is INPATIENT. Inpatient status is judged to be reasonable and necessary in order to provide the required intensity of service to ensure the patient's safety. The patient's presenting symptoms, physical exam findings, and initial radiographic and laboratory data in the context of their chronic comorbidities is felt to place them at high risk for further clinical deterioration. Furthermore, it is not anticipated that the patient will be medically stable for discharge from the hospital within 2 midnights of admission.   * I certify that at the point of admission it is my clinical judgment that the patient will require inpatient hospital care spanning beyond 2 midnights from the point of admission due to high intensity of service, high risk for further deterioration and high frequency of surveillance required.*  Level of care: Telemetry Cardiac    Lou Claretta HERO, MD 04/30/2024, 9:00 PM [facility] Pager: [phone] Isaiah 41:10   If 7PM-7AM, please contact night-coverage [email or web address] Password [email or web address]

## 2024-04-30 NOTE — Patient Instructions (Signed)
 Medication Instructions:  Your physician recommends that you continue on your current medications as directed. Please refer to the Current Medication list given to you today.  *If you need a refill on your cardiac medications before your next appointment, please call your pharmacy*   Follow-Up: At [facility], you and your health needs are our priority.  As part of our continuing mission to provide you with exceptional heart care, our providers are all part of one team.  This team includes your primary Cardiologist (physician) and Advanced Practice Providers or APPs (Physician Assistants and Nurse Practitioners) who all work together to provide you with the care you need, when you need it.   We recommend signing up for the patient portal called MyChart.  Sign up information is provided on this After Visit Summary.  MyChart is used to connect with patients for Virtual Visits (Telemedicine).  Patients are able to view lab/test results, encounter notes, upcoming appointments, etc.  Non-urgent messages can be sent to your provider as well.   To learn more about what you can do with MyChart, go to [email or web address].   Other Instructions       Your physician recommends you go to the emergency department

## 2024-05-01 ENCOUNTER — Encounter (HOSPITAL_COMMUNITY): Payer: Self-pay | Admitting: Student

## 2024-05-01 DIAGNOSIS — D6869 Other thrombophilia: Secondary | ICD-10-CM

## 2024-05-01 DIAGNOSIS — I482 Chronic atrial fibrillation, unspecified: Secondary | ICD-10-CM | POA: Diagnosis not present

## 2024-05-01 DIAGNOSIS — I4811 Longstanding persistent atrial fibrillation: Secondary | ICD-10-CM | POA: Diagnosis not present

## 2024-05-01 LAB — CBC
HCT: 39.4 % (ref 36.0–46.0)
Hemoglobin: 11.9 g/dL — ABNORMAL LOW (ref 12.0–15.0)
MCH: 22.6 pg — ABNORMAL LOW (ref 26.0–34.0)
MCHC: 30.2 g/dL (ref 30.0–36.0)
MCV: 74.9 fL — ABNORMAL LOW (ref 80.0–100.0)
Platelets: 480 K/uL — ABNORMAL HIGH (ref 150–400)
RBC: 5.26 MIL/uL — ABNORMAL HIGH (ref 3.87–5.11)
RDW: 18.6 % — ABNORMAL HIGH (ref 11.5–15.5)
WBC: 9.4 K/uL (ref 4.0–10.5)
nRBC: 0 % (ref 0.0–0.2)

## 2024-05-01 LAB — BASIC METABOLIC PANEL WITH GFR
Anion gap: 7 (ref 5–15)
BUN: 23 mg/dL (ref 8–23)
CO2: 24 mmol/L (ref 22–32)
Calcium: 8 mg/dL — ABNORMAL LOW (ref 8.9–10.3)
Chloride: 102 mmol/L (ref 98–111)
Creatinine, Ser: 1.15 mg/dL — ABNORMAL HIGH (ref 0.44–1.00)
GFR, Estimated: 49 mL/min — ABNORMAL LOW (ref >60)
Glucose, Bld: 142 mg/dL — ABNORMAL HIGH (ref 70–99)
Potassium: 4 mmol/L (ref 3.5–5.1)
Sodium: 133 mmol/L — ABNORMAL LOW (ref 135–145)

## 2024-05-01 LAB — GLUCOSE, CAPILLARY: Glucose-Capillary: 111 mg/dL — ABNORMAL HIGH (ref 70–99)

## 2024-05-01 LAB — MRSA NEXT GEN BY PCR, NASAL: MRSA by PCR Next Gen: DETECTED — AB

## 2024-05-01 LAB — TSH: TSH: 0.761 u[IU]/mL (ref 0.350–4.500)

## 2024-05-01 MED ORDER — DILTIAZEM HCL ER COATED BEADS 180 MG PO CP24
360.0000 mg | ORAL_CAPSULE | Freq: Every day | ORAL | Status: DC
  Administered 2024-05-01 – 2024-05-03 (×3): 360 mg via ORAL
  Filled 2024-05-01 (×3): qty 2

## 2024-05-01 MED ORDER — CHLORHEXIDINE GLUCONATE CLOTH 2 % EX PADS
6.0000 | MEDICATED_PAD | Freq: Every day | CUTANEOUS | Status: DC
  Administered 2024-05-01 – 2024-05-02 (×2): 6 via TOPICAL

## 2024-05-01 MED ORDER — METOPROLOL SUCCINATE ER 100 MG PO TB24
100.0000 mg | ORAL_TABLET | Freq: Every day | ORAL | Status: DC
  Administered 2024-05-01 – 2024-05-02 (×2): 100 mg via ORAL
  Filled 2024-05-01 (×2): qty 1

## 2024-05-01 MED ORDER — MUPIROCIN 2 % EX OINT
1.0000 | TOPICAL_OINTMENT | Freq: Two times a day (BID) | CUTANEOUS | Status: DC
  Administered 2024-05-01 – 2024-05-02 (×4): 1 via NASAL
  Filled 2024-05-01: qty 22

## 2024-05-01 MED ORDER — HEPARIN SODIUM (PORCINE) 5000 UNIT/ML IJ SOLN
INTRAMUSCULAR | Status: AC
  Filled 2024-05-01: qty 1

## 2024-05-01 MED ORDER — HEPARIN (PORCINE) 25000 UT/250ML-% IV SOLN
1150.0000 [IU]/h | INTRAVENOUS | Status: DC
  Administered 2024-05-01: 1000 [IU]/h via INTRAVENOUS
  Filled 2024-05-01: qty 250

## 2024-05-01 NOTE — Progress Notes (Signed)
 Progress Note   Patient: Theresa Mendoza FMW:[id number] DOB: 10-27-1945 DOA: 04/30/2024     1 DOS: the patient was seen and examined on 05/01/2024   Brief hospital course:  Theresa Mendoza is a 78 y.o. female with medical history significant for atrial fibrillation on Eliquis , aortic stenosis, pulmonary hypertension, hypertension, hyperlipidemia, CKD3a, HFpEF, rheumatoid arthritis, anxiety/depression/BPD, COPD, glaucoma, migraine headaches, mild cognitive impairment, type 2 diabetes, and chronic back pain who was sent directly from cardiology office for evaluation and management of A-fib with RVR.  Patient reports that over the last few days, she has had increased weakness, fatigue with no energy.  She also endorsed nausea and mild shortness of breath but no vomiting, abdominal pain, cough, chest pain, dizziness, fevers or chills.  She was seen in the cardiology office and found to have A-fib with RVR.  She was advised to present to the ED after agreeing to plan for possible ablation versus pacemaker placement.   Assessment and Plan:  # A-fib with RVR - Patient with permanent A-fib with multiple ED visits and hospitalizations for A-fib with RVR - Previously declined pacemaker placement however patient agreeable after discussing with cardiology in the outpatient today - HR improved today 90-100s, SBP holding steady in the 120-130s - Continue diltiazem  drip, with close monitoring of BP - Cardiology following, plan for EP evaluated patient, did not feel that patient is a candidate for ablation, plan for PPM likely tomorrow - Change Eliquis  to heparin  - Follow-up TSH last one was 0.839 - Telemetry   # COPD - Reported mild shortness of breath but nothing active exacerbation - Start daily ICS-LABA therapy - As needed DuoNebs - Follow-up with pulmonology in the outpatient   # T2DM - Last A1c 7.6% 7 months ago - SSI with meals, CBG monitoring    # HFimEF # Aortic stenosis - EF 40-45%  in 2022, repeat TTE on 09/2023 with improved EF 60-65% with RVSP 45.9 mmHg, LA and RA with mild dilatation, mild mitral valve stenosis and moderate aortic valve stenosis  - Patient euvolemic on exam - Continue Lasix    # CKD 3A - Creatinine of 1.13 stable compared to baseline - Trend renal function, avoid nephrotoxic agents   # Anxiety and depression # Bipolar disorder - Continue Cymbalta  and as needed hydroxyzine    # CAD # HLD - Continue pravastatin    # GERD - Continue PPI   # Tobacco use disorder - Counseling was done      Subjective: Denies any nausea vomiting chest pain or palpitations  Physical Exam: Vitals:   05/01/24 0721 05/01/24 1137 05/01/24 1138 05/01/24 1140  BP: 103/64 103/64  128/85  Pulse: 83 (!) 128 (!) 128 (!) 129  Resp: 20  16 16   Temp: 97.7 F (36.5 C)   98.8 F (37.1 C)  TempSrc: Oral   Oral  SpO2: 97%  96% 97%  Weight:      Height:       Constitutional: Alert, awake, calm, comfortable HEENT: Neck supple Respiratory: clear to auscultation bilaterally, no wheezing, no crackles. Normal respiratory effort. No accessory muscle use.  Cardiovascular: Irregular heart rhythm, no murmurs / rubs / gallops. No extremity edema. 2+ pedal pulses. No carotid bruits.  Abdomen: no tenderness, no masses palpated. No hepatosplenomegaly. Bowel sounds positive.  Musculoskeletal: no clubbing / cyanosis. No joint deformity upper and lower extremities. Good ROM, no contractures. Normal muscle tone.  Skin: no rashes, lesions, ulcers. No induration Neurologic: CN 2-12 grossly intact. Sensation intact, DTR  normal. Strength 5/5 x all 4 extremities.  Psychiatric: Normal judgment and insight. Alert and oriented x 3. Normal mood.   Data Reviewed:  Reviewed  Family Communication: None  Disposition: Status is: Inpatient Remains inpatient appropriate because: Ongoing recovery from atrial fibrillation with RVR  Planned Discharge Destination: Home    Time spent: 35  minutes  Author: Nena Rebel, MD 05/01/2024 1:33 PM  For on call review [email or web address].

## 2024-05-01 NOTE — TOC Initial Note (Addendum)
 Transition of Care [facility]) - Initial/Assessment Note    Patient Details  Name: Theresa Mendoza MRN: [id number] Date of Birth: [birth year]  [facility] CM/SW Contact:    Roxie KANDICE Stain, RN Phone Number: 05/01/2024, 11:15 AM  Clinical Narrative:                 Spoke to patient regarding transition needs. Patient lives with son and DIL. Patient's son ,[name], is looking for patient an ALF. Patient is active with [facility]. Cory with [employer] notified.  Son provides transportation. Patient has walker and cane.  Plan for patient to have pacemaker insertion.  Will need resumption home health PT orders.   Expected Discharge Plan: Home w Home Health Services Barriers to Discharge: Continued Medical Work up   Patient Goals and CMS Choice Patient states their goals for this hospitalization and ongoing recovery are:: retunr home          Expected Discharge Plan and Services   Discharge Planning Services: CM Consult   Living arrangements for the past 2 months: Single Family Home                           HH Arranged: PT HH Agency: [facility] Date [facility] Agency Contacted: 05/01/24 Time HH Agency Contacted: 1113 Representative spoke with at [facility] Agency: Darleene  Prior Living Arrangements/Services Living arrangements for the past 2 months: Single Family Home Lives with:: Adult Children, Other (Comment) (DIL) Patient language and need for interpreter reviewed:: Yes Do you feel safe going back to the place where you live?: Yes      Need for Family Participation in Patient Care: Yes (Comment) Care giver support system in place?: Yes (comment) Current home services: DME (walker) Criminal Activity/Legal Involvement Pertinent to Current Situation/Hospitalization: No - Comment as needed  Activities of Daily Living   ADL Screening (condition at time of admission) Independently performs ADLs?: Yes (appropriate for developmental age) Is the patient deaf or have  difficulty hearing?: No Does the patient have difficulty seeing, even when wearing glasses/contacts?: No Does the patient have difficulty concentrating, remembering, or making decisions?: No  Permission Sought/Granted         Permission granted to share info w AGENCY: HH        Emotional Assessment Appearance:: Appears stated age Attitude/Demeanor/Rapport: Engaged Affect (typically observed): Accepting Orientation: : Oriented to Self, Oriented to Place, Oriented to  Time, Oriented to Situation Alcohol / Substance Use: Not Applicable Psych Involvement: No (comment)  Admission diagnosis:  Atrial fibrillation with RVR (HCC) [I48.91] Patient Active Problem List   Diagnosis Date Noted   Type 2 diabetes mellitus with hyperglycemia, without long-term current use of insulin  (HCC) 04/30/2024   Atrial fibrillation with RVR (HCC) 04/02/2024   CKD stage 3a, GFR 45-59 ml/min (HCC) 02/20/2024   Aortic valve disease 02/18/2024   Type 2 diabetes mellitus with hyperlipidemia (HCC) 02/18/2024   Benign hypertension 02/18/2024   Aortic stenosis 11/06/2023   Stage 3a chronic kidney disease (HCC) 11/06/2023   Chest pain 09/22/2023   Diabetes mellitus type II, non insulin  dependent (HCC) 10/14/2021   COPD exacerbation (HCC) 10/14/2021   Acute exacerbation of chronic obstructive pulmonary disease (COPD) (HCC) 10/13/2021   Chronic combined systolic and diastolic CHF (congestive heart failure) (HCC) 10/13/2021   Depression with anxiety 10/13/2021   Chronic back pain 10/13/2021   Pain due to onychomycosis of toenails of both feet 10/20/2020   Coagulation disorder 10/20/2020  Chronic CHF (congestive heart failure) (HCC)    Essential hypertension    Major depressive disorder, recurrent episode, moderate (HCC) 10/09/2020   Heart failure with improved ejection fraction (HFimpEF) (HCC) 10/07/2020   Encounter for tobacco use cessation counseling 12/04/2017   Chronic respiratory failure with hypoxia  (HCC) 06/18/2017   COPD (chronic obstructive pulmonary disease) (HCC) 08/24/2016   Chronic bronchitis (HCC) 08/24/2016   Hypersomnia 08/24/2016   Hyperlipidemia 12/25/2015   CAD (coronary artery disease), native coronary artery 12/24/2015   Old MI (myocardial infarction) 12/24/2015   Longstanding persistent atrial fibrillation (HCC) 12/24/2015   Cognitive and behavioral changes 12/15/2015   Tobacco use disorder 12/15/2015   PCP:  Regino Slater, MD Pharmacy:   [facility] [phone] [place] [place], [place] - [address] [place] [address] Phone: [phone] Fax: [phone]  [id number] - [place], [place] - [address] [place] [address] Phone: [phone] Fax: [phone]     Social Drivers of Health (SDOH) Social History: SDOH Screenings   Food Insecurity: No Food Insecurity (04/30/2024)  Housing: Low Risk  (04/30/2024)  Transportation Needs: No Transportation Needs (04/30/2024)  Utilities: Not At Risk (04/30/2024)  Alcohol Screen: Low Risk  (10/13/2020)  Depression (PHQ2-9): Low Risk  (01/24/2024)  Financial Resource Strain: Low Risk  (01/24/2024)  Physical Activity: Inactive (01/24/2024)  Social Connections: Socially Isolated (04/30/2024)  Tobacco Use: High Risk (04/30/2024)   SDOH Interventions:     Readmission Risk Interventions    02/18/2024    5:56 PM 09/24/2023   12:34 PM  Readmission Risk Prevention Plan  Post Dischage Appt  Complete  Medication Screening  Complete  Transportation Screening Complete Complete  HRI or Home Care Consult Complete   Palliative Care Screening Not Applicable   Medication Review (RN Care Manager) Complete

## 2024-05-01 NOTE — Plan of Care (Signed)

## 2024-05-01 NOTE — Progress Notes (Addendum)
 PHARMACY - ANTICOAGULATION CONSULT NOTE  Pharmacy Consult for heparin   Indication: atrial fibrillation  Allergies  Allergen Reactions   Lithobid [Lithium] Other (See Comments)    Suicidal ideation   Tegretol [Carbamazepine] Other (See Comments)    Melted her skin    Patient Measurements: Height: 5' 5 (165.1 cm) Weight: 71.8 kg (158 lb 4.8 oz) IBW/kg (Calculated) : 57 HEPARIN  DW (KG): 71.4  Vital Signs: Temp: 97.7 F (36.5 C) (10/16 0721) Temp Source: Oral (10/16 0721) BP: 103/64 (10/16 0721) Pulse Rate: 83 (10/16 0721)  Labs: Recent Labs    04/30/24 1724 04/30/24 1756 04/30/24 1945 05/01/24 0313  HGB 13.9 16.0*  --  11.9*  HCT 46.2* 47.0*  --  39.4  PLT 573*  --   --  480*  CREATININE 1.13* 1.10*  --  1.15*  TROPONINIHS 19*  --  18*  --     Estimated Creatinine Clearance: 41.3 mL/min (A) (by C-G formula based on SCr of 1.15 mg/dL (H)).   Medical History: Past Medical History:  Diagnosis Date   A-fib [facility])    Anxiety    Bipolar 1 disorder (HCC)    Bowel obstruction (HCC)    Cataract    Chronic atrial fibrillation (HCC) 09/2014   COPD (chronic obstructive pulmonary disease) (HCC)    Dyslipidemia, goal LDL below 70 12/25/2015   Encounter for tobacco use cessation counseling 12/04/2017   Glaucoma    Hx of migraine headaches    Memory loss    Mitral regurgitation    mild to moderate on echo 09/2014   Old MI (myocardial infarction) 09/2014   cath with normal coronary arteries and EF 50-55%   Osteoarthritis    Pulmonary HTN (HCC) 09/2014   Mild with PASP at cath   RBBB    Rheumatoid arthritis (HCC)    Tobacco abuse     Medications:  Medications Prior to Admission  Medication Sig Dispense Refill Last Dose/Taking   apixaban  (ELIQUIS ) 5 MG TABS tablet Take 1 tablet (5 mg total) by mouth 2 (two) times daily. NEEDS CARDIOLOGY APPT FOR ELIQUIS  REFILLS, PLEASE CALL OFFICE 60 tablet 0 04/30/2024 at  7:30 AM   diltiazem  (CARDIZEM  CD) 360 MG 24 hr  capsule Take 1 capsule (360 mg total) by mouth daily. 30 capsule 0 04/30/2024 Morning   DULoxetine  (CYMBALTA ) 60 MG capsule Take 2 capsules (120 mg total) by mouth daily. 180 capsule 0 04/30/2024 Morning   empagliflozin  (JARDIANCE ) 25 MG TABS tablet Take 1 tablet (25 mg total) by mouth daily. 30 tablet 0 04/30/2024 Morning   furosemide  (LASIX ) 20 MG tablet Take 20 mg by mouth every other day.   04/30/2024 Morning   HYDROcodone -acetaminophen  (NORCO) 10-325 MG tablet Take 1 tablet by mouth 3 (three) times daily as needed for moderate pain (pain score 4-6).   Past Week   hydrOXYzine  (ATARAX ) 25 MG tablet Take 1 tablet (25 mg total) by mouth 3 (three) times daily as needed for anxiety. 30 tablet 0 Past Week   metFORMIN  (GLUCOPHAGE ) 500 MG tablet Take 1 tablet (500 mg total) by mouth 2 (two) times daily with a meal. 60 tablet 2 04/30/2024 Morning   metoprolol  succinate (TOPROL -XL) 100 MG 24 hr tablet Take 1 tablet (100 mg total) by mouth daily. Take with or immediately following a meal. 30 tablet 0 04/30/2024 Morning   omeprazole (PRILOSEC) 40 MG capsule Take 40 mg by mouth daily as needed (heartburn).   Past Week   ondansetron  (ZOFRAN ) 4 MG tablet  Take 4 mg by mouth every 8 (eight) hours as needed for vomiting or nausea.   Past Month   pravastatin  (PRAVACHOL ) 40 MG tablet Take 1 tablet by mouth daily.   04/30/2024 Morning   nitroGLYCERIN  (NITROSTAT ) 0.4 MG SL tablet Place 1 tablet (0.4 mg total) under the tongue every 5 (five) minutes as needed for chest pain. 30 tablet 0 Unknown   Scheduled:   Chlorhexidine Gluconate Cloth  6 each Topical Daily   diltiazem   360 mg Oral Daily   DULoxetine   120 mg Oral Daily   fluticasone  furoate-vilanterol  1 puff Inhalation Daily   furosemide   20 mg Oral QODAY   metoprolol  succinate  100 mg Oral Daily   mupirocin ointment  1 Application Nasal BID   pantoprazole   40 mg Oral Daily   pravastatin   40 mg Oral Daily    Assessment: 78 yo female with afib/RVR on  apixaban  PTA (last dose 10/16 ~ 9am) and plans noted for PPM. Pharmacy consulted to dose IV heparin     Goal of Therapy:  Heparin  level 0.3-0.7 units/ml aPTT 66-102 seconds Monitor platelets by anticoagulation protocol: Yes   Plan:  -Start heparin  1000 units/he at 10pm -aPTT in 8 hrs -Daily aPTT, heparin  level and CBC  Prentice Poisson, PharmD Clinical Pharmacist **Pharmacist phone directory can now be found on [email or web address] ([email or web address]).  Listed under [facility].  Addendum -plans are for PPM on 10/22 and pharmacy asked to resume apixaban   Plan -resume apixaban  5mg  po bid tonight  Prentice Poisson, PharmD Clinical Pharmacist **Pharmacist phone directory can now be found on [email or web address] ([email or web address]).  Listed under [facility].

## 2024-05-01 NOTE — Plan of Care (Signed)
  Problem: Education: Goal: Knowledge of General Education information will improve Description: Including pain rating scale, medication(s)/side effects and non-pharmacologic comfort measures Outcome: Progressing   Problem: Clinical Measurements: Goal: Ability to maintain clinical measurements within normal limits will improve Outcome: Progressing Goal: Will remain free from infection Outcome: Progressing Goal: Diagnostic test results will improve Outcome: Progressing   Problem: Health Behavior/Discharge Planning: Goal: Ability to manage health-related needs will improve Outcome: Progressing   Problem: Activity: Goal: Risk for activity intolerance will decrease Outcome: Progressing   Problem: Nutrition: Goal: Adequate nutrition will be maintained Outcome: Progressing

## 2024-05-01 NOTE — Hospital Course (Addendum)
 Theresa Mendoza is a 78 y.o. female with medical history significant for atrial fibrillation on Eliquis , aortic stenosis, pulmonary hypertension, hypertension, hyperlipidemia, CKD3a, HFpEF, rheumatoid arthritis, anxiety/depression/BPD, COPD, glaucoma, migraine headaches, mild cognitive impairment, type 2 diabetes, and chronic back pain who was sent directly from cardiology office for evaluation and management of A-fib with RVR.  Patient reports that over the last few days, she has had increased weakness, fatigue with no energy.  She also endorsed nausea and mild shortness of breath but no vomiting, abdominal pain, cough, chest pain, dizziness, fevers or chills.  She was seen in the cardiology office and found to have A-fib with RVR.  She was advised to present to the ED after agreeing to plan for possible ablation versus pacemaker placement.  She was admitted to the hospital, eval by cardiology and EP.  Patient was being planned for pacemaker placement on 10/17 but there was a lack of schedule available.  EP team advised to discharge the patient home and come back on 10/22 for pacemaker placement from home.  That was discussed with the patient and patient verbalized understand instruction and agree with the plan.

## 2024-05-01 NOTE — Consult Note (Addendum)
 ELECTROPHYSIOLOGY CONSULT NOTE    Patient ID: Theresa Mendoza MRN: [id number], DOB/AGE: [birth year] 78 y.o.  Admit date: 04/30/2024 Date of Consult: 05/01/2024  Primary Physician: Regino Slater, MD Primary Cardiologist: Wilbert Bihari, MD  Electrophysiologist: Dr. Almetta   Referring Provider: Dr. Roann  Patient Profile: Theresa Mendoza is a 78 y.o. female with a history of AF, HFrecEF (40-45% -> 55%), RBBB, CAD, HLD, mild MR, moderate AAS, pHTN, and RA who is being seen today for the evaluation of AFwRVR at the request of Dr. Paudel.  HPI:  Theresa Mendoza is a 78 y.o. female who presented to [facility] ER on 04/30/2024 with reports of shortness of breath in the setting of AFwRVR.  She was recently evaluated by Dr. Almetta in September 2025 for [facility].  She was not felt to be an AF ablation candidate due to significant COPD, ongoing tobacco use and longstanding persistent AF.  It was felt that her chances of maintaining NSR were low and procedural risk for general anesthesia were not insignificant.  AV node ablation and PPM implant was discussed during that time and the patient was not interested in pursuing.  Antiarrhythmic drugs and cardioversion were also discussed > specifically a trial of amiodarone  then cardioversion.  However given her underlying lung disease this was not pursued.  She was maintained with rate control at that time.  In the ER on 04/30/2024 she received 1 L NS bolus, Zofran , DuoNeb and IV Cardizem  infusion.  She was admitted per [facility] and EP consulted for evaluation of AF.  She reports she is largely asymptomatic from her AF / unaware of elevated rates.  She states she goes to the doctor's office and they send her to the ER all the time.    She denies chest pain, palpitations, dyspnea, PND, orthopnea, nausea, vomiting, dizziness, syncope, edema, weight gain, or early satiety.   Labs Potassium4.0 (10/16 9686) Magnesium   2.2 (10/15 1736) Creatinine, ser   1.15* (10/16 0313) PLT  480* (10/16 0313) HGB  11.9* (10/16 0313) WBC 9.4 (10/16 0313) Troponin I (High Sensitivity)18* (10/15 1945).    Past Medical History:  Diagnosis Date   A-fib [facility])    Anxiety    Bipolar 1 disorder (HCC)    Bowel obstruction (HCC)    Cataract    Chronic atrial fibrillation (HCC) 09/2014   COPD (chronic obstructive pulmonary disease) (HCC)    Dyslipidemia, goal LDL below 70 12/25/2015   Encounter for tobacco use cessation counseling 12/04/2017   Glaucoma    Hx of migraine headaches    Memory loss    Mitral regurgitation    mild to moderate on echo 09/2014   Old MI (myocardial infarction) 09/2014   cath with normal coronary arteries and EF 50-55%   Osteoarthritis    Pulmonary HTN (HCC) 09/2014   Mild with PASP at cath   RBBB    Rheumatoid arthritis (HCC)    Tobacco abuse      Surgical History:  Past Surgical History:  Procedure Laterality Date   ADENOIDECTOMY     BACK SURGERY     bowel obstruction     CARDIAC CATHETERIZATION  09/2014   normal coronary arteries with mild pulmonary HTN, low normal LVF   CHOLECYSTECTOMY     NOSE SURGERY     TONSILLECTOMY     TRANSESOPHAGEAL ECHOCARDIOGRAM WITH CARDIOVERSION  09/2014   VAGINAL HYSTERECTOMY     with BSO     Medications Prior to Admission  Medication Sig  Dispense Refill Last Dose/Taking   apixaban  (ELIQUIS ) 5 MG TABS tablet Take 1 tablet (5 mg total) by mouth 2 (two) times daily. NEEDS CARDIOLOGY APPT FOR ELIQUIS  REFILLS, PLEASE CALL OFFICE 60 tablet 0 04/30/2024 at  7:30 AM   diltiazem  (CARDIZEM  CD) 360 MG 24 hr capsule Take 1 capsule (360 mg total) by mouth daily. 30 capsule 0 04/30/2024 Morning   DULoxetine  (CYMBALTA ) 60 MG capsule Take 2 capsules (120 mg total) by mouth daily. 180 capsule 0 04/30/2024 Morning   empagliflozin  (JARDIANCE ) 25 MG TABS tablet Take 1 tablet (25 mg total) by mouth daily. 30 tablet 0 04/30/2024 Morning   furosemide  (LASIX ) 20 MG tablet Take 20 mg by mouth every  other day.   04/30/2024 Morning   HYDROcodone -acetaminophen  (NORCO) 10-325 MG tablet Take 1 tablet by mouth 3 (three) times daily as needed for moderate pain (pain score 4-6).   Past Week   hydrOXYzine  (ATARAX ) 25 MG tablet Take 1 tablet (25 mg total) by mouth 3 (three) times daily as needed for anxiety. 30 tablet 0 Past Week   metFORMIN  (GLUCOPHAGE ) 500 MG tablet Take 1 tablet (500 mg total) by mouth 2 (two) times daily with a meal. 60 tablet 2 04/30/2024 Morning   metoprolol  succinate (TOPROL -XL) 100 MG 24 hr tablet Take 1 tablet (100 mg total) by mouth daily. Take with or immediately following a meal. 30 tablet 0 04/30/2024 Morning   omeprazole (PRILOSEC) 40 MG capsule Take 40 mg by mouth daily as needed (heartburn).   Past Week   ondansetron  (ZOFRAN ) 4 MG tablet Take 4 mg by mouth every 8 (eight) hours as needed for vomiting or nausea.   Past Month   pravastatin  (PRAVACHOL ) 40 MG tablet Take 1 tablet by mouth daily.   04/30/2024 Morning   nitroGLYCERIN  (NITROSTAT ) 0.4 MG SL tablet Place 1 tablet (0.4 mg total) under the tongue every 5 (five) minutes as needed for chest pain. 30 tablet 0 Unknown    Inpatient Medications:   Chlorhexidine Gluconate Cloth  6 each Topical Daily   diltiazem   360 mg Oral Daily   DULoxetine   120 mg Oral Daily   fluticasone  furoate-vilanterol  1 puff Inhalation Daily   furosemide   20 mg Oral QODAY   metoprolol  succinate  100 mg Oral Daily   mupirocin ointment  1 Application Nasal BID   pantoprazole   40 mg Oral Daily   pravastatin   40 mg Oral Daily    Allergies:  Allergies  Allergen Reactions   Lithobid [Lithium] Other (See Comments)    Suicidal ideation   Tegretol [Carbamazepine] Other (See Comments)    Melted her skin    Family History  Problem Relation Age of Onset   Alzheimer's disease Mother    Lung cancer Mother    Tuberculosis Father    Depression Father      Physical Exam: Vitals:   04/30/24 2320 04/30/24 2334 05/01/24 0428 05/01/24 0721   BP: (!) 148/83  116/72 103/64  Pulse: 85 90 89 83  Resp: (!) 23 18 16 20   Temp: (!) 97.5 F (36.4 C)  97.8 F (36.6 C) 97.7 F (36.5 C)  TempSrc: Oral  Other (Comment) Oral  SpO2: 93% 96% 95% 97%  Weight:      Height:        GEN- NAD, A&O x 3, normal affect HEENT: Normocephalic, atraumatic Lungs- CTAB, Normal effort.  Heart- Irregularly irregular rate and rhythm, No M/G/R.  GI- Soft, NT, ND.  Extremities- No clubbing, cyanosis, or  edema   Radiology/Studies: DG Chest Portable 1 View Result Date: 04/30/2024 CLINICAL DATA:  weakness EXAM: PORTABLE CHEST - 1 VIEW COMPARISON:  04/10/2024 FINDINGS: Mild elevation of the right hemidiaphragm. No focal airspace consolidation, pleural effusion, or pneumothorax. No cardiomegaly. Tortuous aorta with aortic atherosclerosis. No acute fracture or destructive lesions. Multilevel thoracic osteophytosis. Cholecystectomy clips. IMPRESSION: No acute cardiopulmonary abnormality. Electronically Signed   By: Rogelia Myers M.D.   On: 04/30/2024 17:44   DG Chest Port 1 View Result Date: 04/10/2024 CLINICAL DATA:  Fatigue.  Atrial fibrillation. EXAM: PORTABLE CHEST 1 VIEW COMPARISON:  04/02/2024 FINDINGS: Heart size and pulmonary vascularity are normal for technique. Lungs are clear. No pleural effusion or pneumothorax. Mediastinal contours appear intact. Degenerative changes in the spine and shoulders. Calcification of the aorta. IMPRESSION: No active disease. Electronically Signed   By: Elsie Gravely M.D.   On: 04/10/2024 18:12   DG Chest Port 1 View Result Date: 04/02/2024 CLINICAL DATA:  cough EXAM: PORTABLE CHEST - 1 VIEW COMPARISON:  02/14/2024 FINDINGS: Chronic coarsening of the pulmonary interstitium without overt pulmonary edema. No focal airspace consolidation, pleural effusion, or pneumothorax. No cardiomegaly. Tortuous aorta with aortic atherosclerosis. No acute fracture or destructive lesions. Multilevel thoracic osteophytosis. IMPRESSION: No  acute cardiopulmonary abnormality. Electronically Signed   By: Rogelia Myers M.D.   On: 04/02/2024 13:45    EKG:04/30/24 > AF with RBBB, 124 bpm (personally reviewed)  TELEMETRY: AF 70-120's (personally reviewed)  DEVICE HISTORY: N/A  Assessment/Plan:  Atrial Fibrillation with RVR  Longstanding Persistent Atrial Fibrillation  Not felt to be an ablation candidate. -OAC for stroke prophylaxis -continue diltiazem  infusion & wean to off   -resume home Toprol , may need to increase dose before discharge  -resume home Diltiazem   -NPO after MN for possible PPM  -procedure would be staged > PPM first, rate control then AV nodal ablation in 6 weeks after PPM healed in place   Secondary Hypercoagulable State  -hold home eliquis  for now  -begin heparin  gtt per pharmacy   Obstructive Lung Disease  Tobacco Abuse  -per [facility]    For questions or updates, please contact [facility] Please consult [email or web address] for contact info under    Signed, Daphne Barrack, NP-C, AGACNP-BC [facility] - Electrophysiology  05/01/2024, 10:34 AM  I have seen, examined the patient, and reviewed the above assessment and plan.    HPI: Theresa Mendoza is a 78 year old female with past medical history notable for longstanding persistent AF, COPD, CAD, HFrecEF (40-45%->55%), RBBB, mild MR, moderate AS, pHTN, HLD, and rheumatoid arthritis.  She was sent to the emergency room from a doctor's appointment after being found to have elevated heart rates.  She has had numerous similar admissions.  These do not always correlate with symptoms.  Patient states that yesterday while at the doctor's office she felt at her usual baseline.  Although she states that she has not felt well for quite some time.  Her primary issue is shortness of breath, which she attributes to her COPD and ongoing tobacco use.  Today, at rest she reports no new or acute complaints.  General: Chronically ill appearing, in no acute  distress.  Neck: No JVD.  Cardiac: Normal rate, irregular rhythm.  Resp: Normal work of breathing.  Ext: No edema.  Neuro: No gross focal deficits.  Psych: Normal affect.   Assessment:  She has been in longstanding persistent atrial fibrillation since at least 2016. She is frequently in AF/RVR with rates in the 120-130s,  which has prompted multiple hospital admissions, although these do not always correlate with worsening symptoms.  She is not an ideal ablation candidate due to her significant COPD and reduced PFTs, all in setting of ongoing tobacco use.  The same issues make amiodarone  and undesirable medical therapy.  Tikosyn has been considered previously but there was some concern over her ability to take medication reliably.  Given that she also has longstanding bifascicular block (RBBB/LAFB), the best course of action at this time appears to be pacemaker implantation and subsequent AV node ablation.  Problem List:  #. Longstanding persistent AF:  #. Hypercoagulable state due to AF:   Plan:  -Resume home rate control medications-diltiazem  360 mg daily and metoprolol  XL 100 mg daily. -If we can accommodate based on lab schedule, we will perform dual chamber pacemaker implant then have her return as an outpatient for AV node ablation.  We will hold additional doses of Eliquis  and start heparin  this evening in the event that we can add patient onto the schedule tomorrow.  N.p.o. after midnight.  [name], MD 05/01/2024 11:06 PM

## 2024-05-02 ENCOUNTER — Encounter (HOSPITAL_COMMUNITY): Payer: Self-pay | Admitting: Student

## 2024-05-02 DIAGNOSIS — I4821 Permanent atrial fibrillation: Secondary | ICD-10-CM

## 2024-05-02 DIAGNOSIS — I4891 Unspecified atrial fibrillation: Secondary | ICD-10-CM | POA: Diagnosis not present

## 2024-05-02 DIAGNOSIS — I482 Chronic atrial fibrillation, unspecified: Secondary | ICD-10-CM | POA: Diagnosis not present

## 2024-05-02 DIAGNOSIS — D6869 Other thrombophilia: Secondary | ICD-10-CM | POA: Diagnosis not present

## 2024-05-02 LAB — BASIC METABOLIC PANEL WITH GFR
Anion gap: 11 (ref 5–15)
BUN: 24 mg/dL — ABNORMAL HIGH (ref 8–23)
CO2: 24 mmol/L (ref 22–32)
Calcium: 8.7 mg/dL — ABNORMAL LOW (ref 8.9–10.3)
Chloride: 101 mmol/L (ref 98–111)
Creatinine, Ser: 1.15 mg/dL — ABNORMAL HIGH (ref 0.44–1.00)
GFR, Estimated: 49 mL/min — ABNORMAL LOW (ref >60)
Glucose, Bld: 127 mg/dL — ABNORMAL HIGH (ref 70–99)
Potassium: 4.2 mmol/L (ref 3.5–5.1)
Sodium: 136 mmol/L (ref 135–145)

## 2024-05-02 LAB — COMPREHENSIVE METABOLIC PANEL WITH GFR
ALT: 10 U/L (ref 0–44)
AST: 14 U/L — ABNORMAL LOW (ref 15–41)
Albumin: 3.3 g/dL — ABNORMAL LOW (ref 3.5–5.0)
Alkaline Phosphatase: 72 U/L (ref 38–126)
Anion gap: 8 (ref 5–15)
BUN: 18 mg/dL (ref 8–23)
CO2: 25 mmol/L (ref 22–32)
Calcium: 8.5 mg/dL — ABNORMAL LOW (ref 8.9–10.3)
Chloride: 103 mmol/L (ref 98–111)
Creatinine, Ser: 1 mg/dL (ref 0.44–1.00)
GFR, Estimated: 58 mL/min — ABNORMAL LOW (ref >60)
Glucose, Bld: 92 mg/dL (ref 70–99)
Potassium: 4 mmol/L (ref 3.5–5.1)
Sodium: 136 mmol/L (ref 135–145)
Total Bilirubin: 0.4 mg/dL (ref 0.0–1.2)
Total Protein: 5.9 g/dL — ABNORMAL LOW (ref 6.5–8.1)

## 2024-05-02 LAB — CBC
HCT: 38.9 % (ref 36.0–46.0)
Hemoglobin: 11.8 g/dL — ABNORMAL LOW (ref 12.0–15.0)
MCH: 22.6 pg — ABNORMAL LOW (ref 26.0–34.0)
MCHC: 30.3 g/dL (ref 30.0–36.0)
MCV: 74.5 fL — ABNORMAL LOW (ref 80.0–100.0)
Platelets: 439 K/uL — ABNORMAL HIGH (ref 150–400)
RBC: 5.22 MIL/uL — ABNORMAL HIGH (ref 3.87–5.11)
RDW: 18.8 % — ABNORMAL HIGH (ref 11.5–15.5)
WBC: 9.9 K/uL (ref 4.0–10.5)
nRBC: 0 % (ref 0.0–0.2)

## 2024-05-02 LAB — APTT: aPTT: 56 s — ABNORMAL HIGH (ref 24–36)

## 2024-05-02 LAB — HEPARIN LEVEL (UNFRACTIONATED): Heparin Unfractionated: >1.1 [IU]/mL — ABNORMAL HIGH (ref 0.30–0.70)

## 2024-05-02 LAB — MAGNESIUM: Magnesium: 2.2 mg/dL (ref 1.7–2.4)

## 2024-05-02 MED ORDER — MAGNESIUM GLUCONATE 500 (27 MG) MG PO TABS
500.0000 mg | ORAL_TABLET | ORAL | Status: AC
  Administered 2024-05-02: 500 mg via ORAL
  Filled 2024-05-02: qty 1

## 2024-05-02 MED ORDER — SODIUM CHLORIDE 0.9% FLUSH
3.0000 mL | Freq: Two times a day (BID) | INTRAVENOUS | Status: DC
  Administered 2024-05-02 (×2): 3 mL via INTRAVENOUS

## 2024-05-02 MED ORDER — CHLORHEXIDINE GLUCONATE 4 % EX SOLN: 60.0000 mL | Freq: Once | CUTANEOUS | Status: DC

## 2024-05-02 MED ORDER — METOPROLOL SUCCINATE ER 50 MG PO TB24
50.0000 mg | ORAL_TABLET | Freq: Once | ORAL | Status: AC
  Administered 2024-05-02: 50 mg via ORAL
  Filled 2024-05-02: qty 1

## 2024-05-02 MED ORDER — APIXABAN 5 MG PO TABS
5.0000 mg | ORAL_TABLET | Freq: Two times a day (BID) | ORAL | Status: DC
  Administered 2024-05-02 – 2024-05-03 (×2): 5 mg via ORAL
  Filled 2024-05-02 (×2): qty 1

## 2024-05-02 MED ORDER — SODIUM CHLORIDE 0.9% FLUSH: 3.0000 mL | INTRAVENOUS | Status: DC | PRN

## 2024-05-02 MED ORDER — SODIUM CHLORIDE 0.9 % IV SOLN: INTRAVENOUS | Status: DC

## 2024-05-02 MED ORDER — SODIUM CHLORIDE 0.9 % IV SOLN
80.0000 mg | INTRAVENOUS | Status: DC
  Filled 2024-05-02: qty 2

## 2024-05-02 MED ORDER — MELATONIN 5 MG PO TABS
5.0000 mg | ORAL_TABLET | ORAL | Status: AC
  Administered 2024-05-02: 5 mg via ORAL
  Filled 2024-05-02: qty 1

## 2024-05-02 MED ORDER — CEFAZOLIN SODIUM-DEXTROSE 2-4 GM/100ML-% IV SOLN: 2.0000 g | INTRAVENOUS | Status: DC

## 2024-05-02 MED FILL — Metoprolol Succinate Tab ER 24HR 25 MG (Tartrate Equiv): 75.0000 mg | ORAL | Qty: 1 | Status: AC

## 2024-05-02 NOTE — Progress Notes (Addendum)
 Patient Name: EURA MCCAUSLIN Date of Encounter: 05/02/2024  Primary Cardiologist: Wilbert Bihari, MD Electrophysiologist: None  Interval Summary   The patient is doing well today.  At this time, the patient denies chest pain, shortness of breath, or any new concerns.  Vital Signs    Vitals:   05/02/24 0539 05/02/24 0743 05/02/24 0821 05/02/24 0859  BP: (!) 153/72 (!) 146/76 (!) 146/76   Pulse: 85 83 79   Resp: (!) 23 16    Temp:  98 F (36.7 C)    TempSrc:  Oral    SpO2: 93% 93%  93%  Weight:      Height:        Intake/Output Summary (Last 24 hours) at 05/02/2024 1109 Last data filed at 05/02/2024 0455 Gross per 24 hour  Intake 1166.04 ml  Output 2500 ml  Net -1333.96 ml   Filed Weights   04/30/24 2039  Weight: 71.8 kg    Physical Exam    GEN- NAD, Alert and oriented  Lungs- Clear to ausculation bilaterally, normal work of breathing Cardiac- Irregularly irregular rate and rhythm, no murmurs, rubs or gallops GI- soft, NT, ND, + BS Extremities- no clubbing or cyanosis. No edema  Telemetry    AF/AFL 60-100's (personally reviewed)  Hospital Course    Theresa Mendoza is a 78 y.o. female with PMH of AF, HFrecEF (40-45% -> 55%), RBBB, CAD, HLD, mild MR, moderate AAS, pHTN, and RA admitted for evaluation of shortness of breath in the setting of AFwRVR. She was started on IV diltiazem .  Home medications were restarted the following am > IV diltiazem  weaned off by 6pm.  Rates with improved control.   Assessment & Plan    Atrial Fibrillation with RVR  Longstanding Persistent Atrial Fibrillation  Not felt to be an ablation candidate. -plan for PPM with AV node ablation in 6 weeks -NPO after clear liquid breakfast  -continue home medications -hopeful to be able to complete 10/17 pending lab availability   Secondary Hypercoagulable State  -heparin  infusion stopped at 1115 for possible procedure  -hold home Eliquis    Obstructive Lung Disease  Tobacco Abuse   -per [facility]    Reviewed plan of care with son, [name], on the phone.    For questions or updates, please contact [facility] Please consult [email or web address] for contact info under     Signed, Daphne Barrack, NP-C, AGACNP-BC [facility] - Electrophysiology  05/02/2024, 11:18 AM   I have seen, examined the patient, and reviewed the above assessment and plan.    Interval:  No acute overnight events. Patient reports feeling relatively well. No new or acute complaints.   Heart rates at rest are in the 70s and 80s.  General: Chronically ill appearing, in no acute distress, in no acute distress.  Neck: No JVD.  Cardiac: Normal rate, irregular rhythm.  Resp: Normal work of breathing.  Ext: No edema.  Neuro: No gross focal deficits.  Psych: Normal affect.   Assessment:  Patient appears to have been in atrial fibrillation since 2016.  She is frequently in AF/RVR with rates in the 120-130s, which has prompted multiple hospital admissions, although these do not always correlate with worsening symptoms.  She is not an ideal ablation candidate due to her significant COPD and reduced PFTs, all in setting of ongoing tobacco use.  The same issues make amiodarone  an undesirable medical therapy.  Tikosyn has been considered previously but there was some concern over her ability to take medication  reliably.  Given that she also has longstanding bifascicular block (RBBB/LAFB), the best course of action at this time appears to be pacemaker implantation and subsequent AV node ablation.  Better rate controlled after resuming oral medications.  Unfortunately, we were unable to accommodate pacemaker implant during this hospital stay.  We have arranged for her to return next Wednesday for outpatient dual-chamber pacemaker implant.   Problem List:  #. Permanent atrial fibrillation: #. Hypercoagulable state due to AF:    Plan:  -Continue home diltiazem  360 mg daily. -Increase metoprolol  XL to  75mg  BID.  -Tentatively planning for dual-chamber pacemaker implant next Wednesday as outpatient.  We will then perform AV node ablation 6 weeks later.   [name], MD 05/02/2024 6:57 PM

## 2024-05-02 NOTE — Plan of Care (Signed)

## 2024-05-02 NOTE — Progress Notes (Signed)
 Patient c/o anxiety and unable to sleep.  Atarax  given at hs, pt reports little effect.  Requesting something for anxiety/sleep.  Dr. Shona paged, waiting for return call.

## 2024-05-02 NOTE — Progress Notes (Signed)
 Progress Note   Patient: Theresa Mendoza FMW:[id number] DOB: 1945/10/04 DOA: 04/30/2024     2 DOS: the patient was seen and examined on 05/02/2024   Brief hospital course:  Theresa Mendoza is a 78 y.o. female with medical history significant for atrial fibrillation on Eliquis , aortic stenosis, pulmonary hypertension, hypertension, hyperlipidemia, CKD3a, HFpEF, rheumatoid arthritis, anxiety/depression/BPD, COPD, glaucoma, migraine headaches, mild cognitive impairment, type 2 diabetes, and chronic back pain who was sent directly from cardiology office for evaluation and management of A-fib with RVR.  Patient reports that over the last few days, she has had increased weakness, fatigue with no energy.  She also endorsed nausea and mild shortness of breath but no vomiting, abdominal pain, cough, chest pain, dizziness, fevers or chills.  She was seen in the cardiology office and found to have A-fib with RVR.  She was advised to present to the ED after agreeing to plan for possible ablation versus pacemaker placement.   Assessment and Plan:  # A-fib with RVR - Patient with permanent A-fib with multiple ED visits and hospitalizations for A-fib with RVR - Previously declined pacemaker placement however patient agreeable after discussing with cardiology in the outpatient today - HR improved today 90-100s, SBP holding steady in the 120-130s - Continue diltiazem  drip, with close monitoring of BP - Cardiology following, plan for EP evaluated patient, did not feel that patient is a candidate for ablation, planned PPM 10/17 - Change Eliquis  to heparin  - Follow-up TSH last one was 0.839 - Telemetry   # COPD - Reported mild shortness of breath but nothing active exacerbation - Start daily ICS-LABA therapy - As needed DuoNebs - Follow-up with pulmonology in the outpatient   # T2DM - Last A1c 7.6% 7 months ago - SSI with meals, CBG monitoring    # HFimEF # Aortic stenosis - EF 40-45% in 2022,  repeat TTE on 09/2023 with improved EF 60-65% with RVSP 45.9 mmHg, LA and RA with mild dilatation, mild mitral valve stenosis and moderate aortic valve stenosis  - Patient euvolemic on exam - Continue Lasix    # CKD 3A - Creatinine of 1.13 stable compared to baseline - Trend renal function, avoid nephrotoxic agents   # Anxiety and depression # Bipolar disorder - Continue Cymbalta  and as needed hydroxyzine    # CAD # HLD - Continue pravastatin    # GERD - Continue PPI   # Tobacco use disorder - Counseling was done       Subjective: Feels better, no chest pain  Physical Exam: Vitals:   05/02/24 0743 05/02/24 0821 05/02/24 0859 05/02/24 1256  BP: (!) 146/76 (!) 146/76  (!) 146/76  Pulse: 83 79  69  Resp: 16   20  Temp: 98 F (36.7 C)   98 F (36.7 C)  TempSrc: Oral   Oral  SpO2: 93%  93% 97%  Weight:      Height:       Constitutional: Alert, awake, calm, comfortable HEENT: Neck supple Respiratory: clear to auscultation bilaterally, no wheezing, no crackles. Normal respiratory effort. No accessory muscle use.  Cardiovascular: Regular rate and rhythm, no murmurs / rubs / gallops. No extremity edema. 2+ pedal pulses. No carotid bruits.  Abdomen: no tenderness, no masses palpated. No hepatosplenomegaly. Bowel sounds positive.  Musculoskeletal: no clubbing / cyanosis. No joint deformity upper and lower extremities. Good ROM, no contractures. Normal muscle tone.  Skin: no rashes, lesions, ulcers. No induration Neurologic: CN 2-12 grossly intact. Sensation intact, DTR normal. Strength 5/5  x all 4 extremities.  Psychiatric: Normal judgment and insight. Alert and oriented x 3. Normal mood.   Data Reviewed:  Reviewed  Family Communication: None  Disposition: Status is: Inpatient Remains inpatient appropriate because: Ongoing recovery from atrial fibrillation requiring possible pacemaker  Planned Discharge Destination: Home    Time spent: 35 minutes  Author: Nena Rebel, MD 05/02/2024 1:55 PM  For on call review [email or web address].

## 2024-05-02 NOTE — Progress Notes (Signed)
 PHARMACY - ANTICOAGULATION  Pharmacy Consult for heparin   Indication: atrial fibrillation Brief A/P: aPTT subtherapeutic Increase Heparin  rate  Allergies  Allergen Reactions   Lithobid [Lithium] Other (See Comments)    Suicidal ideation   Tegretol [Carbamazepine] Other (See Comments)    Melted her skin    Patient Measurements: Height: 5' 5 (165.1 cm) Weight: 71.8 kg (158 lb 4.8 oz) IBW/kg (Calculated) : 57 HEPARIN  DW (KG): 71.4  Vital Signs: Temp: 99.2 F (37.3 C) (10/16 2307) Temp Source: Axillary (10/16 2307) BP: 153/72 (10/17 0539) Pulse Rate: 85 (10/17 0539)  Labs: Recent Labs    04/30/24 1724 04/30/24 1756 04/30/24 1945 05/01/24 0313 05/02/24 0622  HGB 13.9 16.0*  --  11.9* 11.8*  HCT 46.2* 47.0*  --  39.4 38.9  PLT 573*  --   --  480* 439*  APTT  --   --   --   --  56*  HEPARINUNFRC  --   --   --   --  >1.10*  CREATININE 1.13* 1.10*  --  1.15* 1.15*  TROPONINIHS 19*  --  18*  --   --     Estimated Creatinine Clearance: 41.3 mL/min (A) (by C-G formula based on SCr of 1.15 mg/dL (H)).   Assessment: 78 y.o. female with h/o Afib, Eliquis  on hold, for heparin   Goal of Therapy:  Heparin  level 0.3-0.7 units/ml aPTT 66-102 seconds Monitor platelets by anticoagulation protocol: Yes   Plan:  -Increase Heparin  1150 units/hr  Cathlyn Arrant, PharmD, BCPS

## 2024-05-02 NOTE — Progress Notes (Signed)
 Unfortunately, no lab availability for PPM on 10/17.  Medications adjusted for discharge.  Plan for PPM as outpatient on Wednesday 05/07/24.    Plan: -discharge on Toprol  75mg  BID + diltiazem  360 mg daily  -PPM insertion on 05/07/24 -hold Eliquis  > last dose on Sunday PM  -letter given to patient with instructions     Ok for discharge from EP standpoint on Saturday 10/18 if HR's remain stable.     Daphne Barrack, NP-C, AGACNP-BC [facility] - Electrophysiology  05/02/2024, 6:26 PM

## 2024-05-03 DIAGNOSIS — I4891 Unspecified atrial fibrillation: Secondary | ICD-10-CM | POA: Diagnosis not present

## 2024-05-03 DIAGNOSIS — D6869 Other thrombophilia: Secondary | ICD-10-CM | POA: Diagnosis not present

## 2024-05-03 LAB — CBC
HCT: 42 % (ref 36.0–46.0)
Hemoglobin: 12.5 g/dL (ref 12.0–15.0)
MCH: 22.6 pg — ABNORMAL LOW (ref 26.0–34.0)
MCHC: 29.8 g/dL — ABNORMAL LOW (ref 30.0–36.0)
MCV: 75.8 fL — ABNORMAL LOW (ref 80.0–100.0)
Platelets: 394 K/uL (ref 150–400)
RBC: 5.54 MIL/uL — ABNORMAL HIGH (ref 3.87–5.11)
RDW: 19 % — ABNORMAL HIGH (ref 11.5–15.5)
WBC: 11.5 K/uL — ABNORMAL HIGH (ref 4.0–10.5)
nRBC: 0 % (ref 0.0–0.2)

## 2024-05-03 LAB — MAGNESIUM: Magnesium: 2 mg/dL (ref 1.7–2.4)

## 2024-05-03 MED ORDER — METOPROLOL SUCCINATE ER 25 MG PO TB24: 75.0000 mg | ORAL_TABLET | Freq: Two times a day (BID) | ORAL | 0 refills | Status: DC

## 2024-05-03 MED ADMIN — Metoprolol Succinate Tab ER 24HR 25 MG (Tartrate Equiv): 75 mg | ORAL | NDC 00904632206

## 2024-05-03 NOTE — Discharge Summary (Signed)
 Physician Discharge Summary   Patient: Theresa Mendoza MRN: [id number] DOB: 1946-01-05  Admit date:     04/30/2024  Discharge date: 05/03/24  Discharge Physician: Nena Rebel   PCP: Regino Slater, MD   Recommendations at discharge:   Follow-up with EP for pacemaker placement on 05/07/2024. Continue Toprol  75 mg twice daily plus diltiazem  CD 360 mg daily  Discharge Diagnoses: Active Problems:   Atrial fibrillation with RVR (HCC)   Type 2 diabetes mellitus with hyperglycemia, without long-term current use of insulin  (HCC)   Hypercoagulable state due to permanent atrial fibrillation (HCC)  Resolved Problems:   * No resolved hospital problems. *  Hospital Course:  Theresa Mendoza is a 78 y.o. female with medical history significant for atrial fibrillation on Eliquis , aortic stenosis, pulmonary hypertension, hypertension, hyperlipidemia, CKD3a, HFpEF, rheumatoid arthritis, anxiety/depression/BPD, COPD, glaucoma, migraine headaches, mild cognitive impairment, type 2 diabetes, and chronic back pain who was sent directly from cardiology office for evaluation and management of A-fib with RVR.  Patient reports that over the last few days, she has had increased weakness, fatigue with no energy.  She also endorsed nausea and mild shortness of breath but no vomiting, abdominal pain, cough, chest pain, dizziness, fevers or chills.  She was seen in the cardiology office and found to have A-fib with RVR.  She was advised to present to the ED after agreeing to plan for possible ablation versus pacemaker placement.  She was admitted to the hospital, eval by cardiology and EP.  Patient was being planned for pacemaker placement on 10/17 but there was a lack of schedule available.  EP team advised to discharge the patient home and come back on 10/22 for pacemaker placement from home.  That was discussed with the patient and patient verbalized understand instruction and agree with the  plan.  Assessment and Plan:  # A-fib with RVR - Patient with permanent A-fib with multiple ED visits and hospitalizations for A-fib with RVR - Previously declined pacemaker placement however patient agreeable after discussing with cardiology in the outpatient today - HR improved today 90-100s, SBP holding steady in the 120-130s - Continue diltiazem  drip, with close monitoring of BP - Cardiology EP team advised that patient will have a pacemaker placement on 05/05/2024.  They advised to discharge the patient and come back on that day.  Patient will be discharged home on Cardizem  CD 360 mg by mouth and metoprolol  75 mg twice daily.  # COPD - Stable resume home medications  # T2DM - Last A1c 7.6% 7 months ago - Home medication  # HFimEF # Aortic stenosis - EF 40-45% in 2022, repeat TTE on 09/2023 with improved EF 60-65% with RVSP 45.9 mmHg, LA and RA with mild dilatation, mild mitral valve stenosis and moderate aortic valve stenosis  - Patient euvolemic on exam - Continue home dose of Lasix    # CKD 3A - Stable at baseline  # Anxiety and depression # Bipolar disorder - Continue Cymbalta  and as needed hydroxyzine    # CAD # HLD - Continue pravastatin    # GERD - Continue PPI   # Tobacco use disorder - Counseling was done           Consultants: Cardiology/EP Procedures performed: None Disposition: Home Diet recommendation:  Discharge Diet Orders (From admission, onward)     Start     Ordered   05/03/24 0000  Diet - low sodium heart healthy        05/03/24 1113  Carb modified diet DISCHARGE MEDICATION: Allergies as of 05/03/2024       Reactions   Lithobid [lithium] Other (See Comments)   Suicidal ideation   Tegretol [carbamazepine] Other (See Comments)   Melted her skin        Medication List     TAKE these medications    diltiazem  360 MG 24 hr capsule Commonly known as: CARDIZEM  CD Take 1 capsule (360 mg total) by mouth daily.    DULoxetine  60 MG capsule Commonly known as: CYMBALTA  Take 2 capsules (120 mg total) by mouth daily.   Eliquis  5 MG Tabs tablet Generic drug: apixaban  Take 1 tablet (5 mg total) by mouth 2 (two) times daily. NEEDS CARDIOLOGY APPT FOR ELIQUIS  REFILLS, PLEASE CALL OFFICE   empagliflozin  25 MG Tabs tablet Commonly known as: Jardiance  Take 1 tablet (25 mg total) by mouth daily.   furosemide  20 MG tablet Commonly known as: LASIX  Take 20 mg by mouth every other day.   HYDROcodone -acetaminophen  10-325 MG tablet Commonly known as: NORCO Take 1 tablet by mouth 3 (three) times daily as needed for moderate pain (pain score 4-6).   hydrOXYzine  25 MG tablet Commonly known as: ATARAX  Take 1 tablet (25 mg total) by mouth 3 (three) times daily as needed for anxiety.   metFORMIN  500 MG tablet Commonly known as: Glucophage  Take 1 tablet (500 mg total) by mouth 2 (two) times daily with a meal.   metoprolol  succinate 25 MG 24 hr tablet Commonly known as: TOPROL -XL Take 3 tablets (75 mg total) by mouth 2 (two) times daily. What changed:  medication strength how much to take when to take this additional instructions   nitroGLYCERIN  0.4 MG SL tablet Commonly known as: NITROSTAT  Place 1 tablet (0.4 mg total) under the tongue every 5 (five) minutes as needed for chest pain.   omeprazole 40 MG capsule Commonly known as: PRILOSEC Take 40 mg by mouth daily as needed (heartburn).   ondansetron  4 MG tablet Commonly known as: ZOFRAN  Take 4 mg by mouth every 8 (eight) hours as needed for vomiting or nausea.   pravastatin  40 MG tablet Commonly known as: PRAVACHOL  Take 1 tablet by mouth daily.        Discharge Exam: Filed Weights   04/30/24 2039  Weight: 71.8 kg   Constitutional: Alert, awake, calm, comfortable HEENT: Neck supple Respiratory: clear to auscultation bilaterally, no wheezing, no crackles. Normal respiratory effort. No accessory muscle use.  Cardiovascular: Regular rate  and rhythm, no murmurs / rubs / gallops. No extremity edema. 2+ pedal pulses. No carotid bruits.  Abdomen: no tenderness, no masses palpated. No hepatosplenomegaly. Bowel sounds positive.  Musculoskeletal: no clubbing / cyanosis. No joint deformity upper and lower extremities. Good ROM, no contractures. Normal muscle tone.  Skin: no rashes, lesions, ulcers. No induration Neurologic: CN 2-12 grossly intact. Sensation intact, DTR normal. Strength 5/5 x all 4 extremities.  Psychiatric: Normal judgment and insight. Alert and oriented x 3. Normal mood.    Condition at discharge: stable  The results of significant diagnostics from this hospitalization (including imaging, microbiology, ancillary and laboratory) are listed below for reference.   Imaging Studies: DG Chest Portable 1 View Result Date: 04/30/2024 CLINICAL DATA:  weakness EXAM: PORTABLE CHEST - 1 VIEW COMPARISON:  04/10/2024 FINDINGS: Mild elevation of the right hemidiaphragm. No focal airspace consolidation, pleural effusion, or pneumothorax. No cardiomegaly. Tortuous aorta with aortic atherosclerosis. No acute fracture or destructive lesions. Multilevel thoracic osteophytosis. Cholecystectomy clips. IMPRESSION: No acute cardiopulmonary abnormality. Electronically  Signed   By: Rogelia Myers M.D.   On: 04/30/2024 17:44   DG Chest Port 1 View Result Date: 04/10/2024 CLINICAL DATA:  Fatigue.  Atrial fibrillation. EXAM: PORTABLE CHEST 1 VIEW COMPARISON:  04/02/2024 FINDINGS: Heart size and pulmonary vascularity are normal for technique. Lungs are clear. No pleural effusion or pneumothorax. Mediastinal contours appear intact. Degenerative changes in the spine and shoulders. Calcification of the aorta. IMPRESSION: No active disease. Electronically Signed   By: Elsie Gravely M.D.   On: 04/10/2024 18:12    Microbiology: Results for orders placed or performed during the hospital encounter of 04/30/24  MRSA Next Gen by PCR, Nasal     Status:  Abnormal   Collection Time: 04/30/24  8:41 PM   Specimen: Nasal Mucosa; Nasal Swab  Result Value Ref Range Status   MRSA by PCR Next Gen DETECTED (A) NOT DETECTED Final    Comment: RESULT CALLED TO, READ BACK BY AND VERIFIED WITH: T DECAREAUS RN 05/01/2024 @ 0125 (NOTE) The GeneXpert MRSA Assay (FDA approved for NASAL specimens only), is one component of a comprehensive MRSA colonization surveillance program. It is not intended to diagnose MRSA infection nor to guide or monitor treatment for MRSA infections. Test performance is not FDA approved in patients less than 50 years old. Performed at [facility], [address], [place], [place] [address]     Labs: CBC: Recent Labs  Lab 04/30/24 1724 04/30/24 1756 05/01/24 0313 05/02/24 0622 05/03/24 0211  WBC 9.8  --  9.4 9.9 11.5*  NEUTROABS 5.7  --   --   --   --   HGB 13.9 16.0* 11.9* 11.8* 12.5  HCT 46.2* 47.0* 39.4 38.9 42.0  MCV 74.9*  --  74.9* 74.5* 75.8*  PLT 573*  --  480* 439* 394   Basic Metabolic Panel: Recent Labs  Lab 04/30/24 1724 04/30/24 1736 04/30/24 1756 05/01/24 0313 05/02/24 0622 05/02/24 1511 05/03/24 0211  NA 134*  --  136 133* 136 136  --   K 4.4  --  4.5 4.0 4.2 4.0  --   CL 100  --  102 102 101 103  --   CO2 23  --   --  24 24 25   --   GLUCOSE 127*  --  121* 142* 127* 92  --   BUN 25*  --  28* 23 24* 18  --   CREATININE 1.13*  --  1.10* 1.15* 1.15* 1.00  --   CALCIUM  9.1  --   --  8.0* 8.7* 8.5*  --   MG  --  2.2  --   --  2.2  --  2.0   Liver Function Tests: Recent Labs  Lab 04/30/24 1724 05/02/24 1511  AST 16 14*  ALT 12 10  ALKPHOS 97 72  BILITOT 0.8 0.4  PROT 6.7 5.9*  ALBUMIN 4.0 3.3*   CBG: Recent Labs  Lab 05/01/24 0436  GLUCAP 111*    Discharge time spent: greater than 30 minutes.  Signed: Nena Rebel, MD [facility] 05/03/2024

## 2024-05-03 NOTE — Progress Notes (Signed)
 Progress Note  Patient Name: Theresa Mendoza Date of Encounter: 05/03/2024  Primary Cardiologist: Wilbert Bihari, MD   Subjective   Denies chest pain or sob.   Inpatient Medications    Scheduled Meds:  apixaban   5 mg Oral BID   chlorhexidine  60 mL Topical Once   Chlorhexidine Gluconate Cloth  6 each Topical Daily   diltiazem   360 mg Oral Daily   DULoxetine   120 mg Oral Daily   fluticasone  furoate-vilanterol  1 puff Inhalation Daily   furosemide   20 mg Oral QODAY   metoprolol  succinate  75 mg Oral BID   mupirocin ointment  1 Application Nasal BID   pantoprazole   40 mg Oral Daily   pravastatin   40 mg Oral Daily   sodium chloride  flush  3 mL Intravenous Q12H   Continuous Infusions:  sodium chloride      PRN Meds: acetaminophen  **OR** acetaminophen , bisacodyl, HYDROcodone -acetaminophen , hydrOXYzine , ipratropium-albuterol , ondansetron  **OR** ondansetron  (ZOFRAN ) IV, senna-docusate, sodium chloride  flush   Vital Signs    Vitals:   05/02/24 2228 05/03/24 0403 05/03/24 0732 05/03/24 1112  BP: (!) 139/54 137/63 (!) 127/53 (!) 150/60  Pulse: 71  76 87  Resp: 18 16 19  (!) 21  Temp: (!) 97.4 F (36.3 C) (!) 96.9 F (36.1 C) 98.1 F (36.7 C) 97.8 F (36.6 C)  TempSrc: Axillary Axillary Oral Oral  SpO2: 97%  93% 92%  Weight:      Height:        Intake/Output Summary (Last 24 hours) at 05/03/2024 1207 Last data filed at 05/03/2024 1012 Gross per 24 hour  Intake 356 ml  Output --  Net 356 ml   Filed Weights   04/30/24 2039  Weight: 71.8 kg    Telemetry    Atrial fib with a controlled Vr - Personally Reviewed  ECG    none - Personally Reviewed  Physical Exam   GEN: No acute distress.   Neck: No JVD Cardiac: IRIRR, no murmurs, rubs, or gallops.  Respiratory: Clear to auscultation bilaterally. GI: Soft, nontender, non-distended  MS: No edema; No deformity. Neuro:  Nonfocal  Psych: Normal affect   Labs    Chemistry Recent Labs  Lab 04/30/24 1724  04/30/24 1756 05/01/24 0313 05/02/24 0622 05/02/24 1511  NA 134*   < > 133* 136 136  K 4.4   < > 4.0 4.2 4.0  CL 100   < > 102 101 103  CO2 23  --  24 24 25   GLUCOSE 127*   < > 142* 127* 92  BUN 25*   < > 23 24* 18  CREATININE 1.13*   < > 1.15* 1.15* 1.00  CALCIUM  9.1  --  8.0* 8.7* 8.5*  PROT 6.7  --   --   --  5.9*  ALBUMIN 4.0  --   --   --  3.3*  AST 16  --   --   --  14*  ALT 12  --   --   --  10  ALKPHOS 97  --   --   --  72  BILITOT 0.8  --   --   --  0.4  GFRNONAA 50*  --  49* 49* 58*  ANIONGAP 11  --  7 11 8    < > = values in this interval not displayed.     Hematology Recent Labs  Lab 05/01/24 0313 05/02/24 0622 05/03/24 0211  WBC 9.4 9.9 11.5*  RBC 5.26* 5.22* 5.54*  HGB  11.9* 11.8* 12.5  HCT 39.4 38.9 42.0  MCV 74.9* 74.5* 75.8*  MCH 22.6* 22.6* 22.6*  MCHC 30.2 30.3 29.8*  RDW 18.6* 18.8* 19.0*  PLT 480* 439* 394    Cardiac EnzymesNo results for input(s): TROPONINI in the last 168 hours. No results for input(s): TROPIPOC in the last 168 hours.   BNP Recent Labs  Lab 04/30/24 1724  BNP 329.4*     DDimer No results for input(s): DDIMER in the last 168 hours.   Radiology    No results found.  Cardiac Studies   none  Patient Profile     78 y.o. female admitted with afib and a RVR, pending PPM  Assessment & Plan    Atrial fib with a RVR - her rates are controlled. Ok for discharge home with return on 10/22 for PPM insertion  Tachy-brady - for PPM next week followed by AV node ablation. Ok to DC home.     For questions or updates, please contact [facility] Please consult [email or web address] for contact info under Cardiology/STEMI.      Signed, Danelle Birmingham, MD  05/03/2024, 12:07 PM

## 2024-05-03 NOTE — Plan of Care (Signed)

## 2024-05-03 NOTE — Plan of Care (Incomplete)
 Pt D/C home with family. A&OX4. IV and tele removed, personal belongings returned. AVS reviewed and follow up questions answered. No other needs voiced at this time.

## 2024-05-03 NOTE — H&P (View-Only) (Signed)
 Progress Note  Patient Name: Theresa Mendoza Date of Encounter: 05/03/2024  Primary Cardiologist: Wilbert Bihari, MD   Subjective   Denies chest pain or sob.   Inpatient Medications    Scheduled Meds:  apixaban   5 mg Oral BID   chlorhexidine  60 mL Topical Once   Chlorhexidine Gluconate Cloth  6 each Topical Daily   diltiazem   360 mg Oral Daily   DULoxetine   120 mg Oral Daily   fluticasone  furoate-vilanterol  1 puff Inhalation Daily   furosemide   20 mg Oral QODAY   metoprolol  succinate  75 mg Oral BID   mupirocin ointment  1 Application Nasal BID   pantoprazole   40 mg Oral Daily   pravastatin   40 mg Oral Daily   sodium chloride  flush  3 mL Intravenous Q12H   Continuous Infusions:  sodium chloride      PRN Meds: acetaminophen  **OR** acetaminophen , bisacodyl, HYDROcodone -acetaminophen , hydrOXYzine , ipratropium-albuterol , ondansetron  **OR** ondansetron  (ZOFRAN ) IV, senna-docusate, sodium chloride  flush   Vital Signs    Vitals:   05/02/24 2228 05/03/24 0403 05/03/24 0732 05/03/24 1112  BP: (!) 139/54 137/63 (!) 127/53 (!) 150/60  Pulse: 71  76 87  Resp: 18 16 19  (!) 21  Temp: (!) 97.4 F (36.3 C) (!) 96.9 F (36.1 C) 98.1 F (36.7 C) 97.8 F (36.6 C)  TempSrc: Axillary Axillary Oral Oral  SpO2: 97%  93% 92%  Weight:      Height:        Intake/Output Summary (Last 24 hours) at 05/03/2024 1207 Last data filed at 05/03/2024 1012 Gross per 24 hour  Intake 356 ml  Output --  Net 356 ml   Filed Weights   04/30/24 2039  Weight: 71.8 kg    Telemetry    Atrial fib with a controlled Vr - Personally Reviewed  ECG    none - Personally Reviewed  Physical Exam   GEN: No acute distress.   Neck: No JVD Cardiac: IRIRR, no murmurs, rubs, or gallops.  Respiratory: Clear to auscultation bilaterally. GI: Soft, nontender, non-distended  MS: No edema; No deformity. Neuro:  Nonfocal  Psych: Normal affect   Labs    Chemistry Recent Labs  Lab 04/30/24 1724  04/30/24 1756 05/01/24 0313 05/02/24 0622 05/02/24 1511  NA 134*   < > 133* 136 136  K 4.4   < > 4.0 4.2 4.0  CL 100   < > 102 101 103  CO2 23  --  24 24 25   GLUCOSE 127*   < > 142* 127* 92  BUN 25*   < > 23 24* 18  CREATININE 1.13*   < > 1.15* 1.15* 1.00  CALCIUM  9.1  --  8.0* 8.7* 8.5*  PROT 6.7  --   --   --  5.9*  ALBUMIN 4.0  --   --   --  3.3*  AST 16  --   --   --  14*  ALT 12  --   --   --  10  ALKPHOS 97  --   --   --  72  BILITOT 0.8  --   --   --  0.4  GFRNONAA 50*  --  49* 49* 58*  ANIONGAP 11  --  7 11 8    < > = values in this interval not displayed.     Hematology Recent Labs  Lab 05/01/24 0313 05/02/24 0622 05/03/24 0211  WBC 9.4 9.9 11.5*  RBC 5.26* 5.22* 5.54*  HGB  11.9* 11.8* 12.5  HCT 39.4 38.9 42.0  MCV 74.9* 74.5* 75.8*  MCH 22.6* 22.6* 22.6*  MCHC 30.2 30.3 29.8*  RDW 18.6* 18.8* 19.0*  PLT 480* 439* 394    Cardiac EnzymesNo results for input(s): TROPONINI in the last 168 hours. No results for input(s): TROPIPOC in the last 168 hours.   BNP Recent Labs  Lab 04/30/24 1724  BNP 329.4*     DDimer No results for input(s): DDIMER in the last 168 hours.   Radiology    No results found.  Cardiac Studies   none  Patient Profile     78 y.o. female admitted with afib and a RVR, pending PPM  Assessment & Plan    Atrial fib with a RVR - her rates are controlled. Ok for discharge home with return on 10/22 for PPM insertion  Tachy-brady - for PPM next week followed by AV node ablation. Ok to DC home.     For questions or updates, please contact [facility] Please consult [email or web address] for contact info under Cardiology/STEMI.      Signed, Danelle Birmingham, MD  05/03/2024, 12:07 PM

## 2024-05-03 NOTE — TOC Transition Note (Signed)
 Transition of Care [facility]) - Discharge Note   Patient Details  Name: Theresa Mendoza MRN: [id number] Date of Birth: April 20, 1946  Transition of Care [facility]) CM/SW Contact:  Robynn Eileen Hoose, RN Phone Number: 05/03/2024, 12:05 PM   Clinical Narrative:   Patient is being discharged today. Cory with [facility] made aware.    Final next level of care: Home w Home Health Services Barriers to Discharge: No Barriers Identified   Patient Goals and CMS Choice Patient states their goals for this hospitalization and ongoing recovery are:: retunr home          Discharge Placement                       Discharge Plan and Services Additional resources added to the After Visit Summary for     Discharge Planning Services: CM Consult                      HH Arranged: PT [facility] Agency: [facility] Date [facility] Agency Contacted: 05/01/24 Time HH Agency Contacted: 1113 Representative spoke with at [facility] [address] Agency: Darleene  Social Drivers of Health (SDOH) Interventions SDOH Screenings   Food Insecurity: No Food Insecurity (04/30/2024)  Housing: Low Risk  (04/30/2024)  Transportation Needs: No Transportation Needs (04/30/2024)  Utilities: Not At Risk (04/30/2024)  Alcohol Screen: Low Risk  (10/13/2020)  Depression (PHQ2-9): Low Risk  (01/24/2024)  Financial Resource Strain: Low Risk  (01/24/2024)  Physical Activity: Inactive (01/24/2024)  Social Connections: Socially Isolated (04/30/2024)  Tobacco Use: High Risk (05/02/2024)     Readmission Risk Interventions    02/18/2024    5:56 PM 09/24/2023   12:34 PM  Readmission Risk Prevention Plan  Post Dischage Appt  Complete  Medication Screening  Complete  Transportation Screening Complete Complete  HRI or Home Care Consult Complete   Palliative Care Screening Not Applicable   Medication Review (RN Care Manager) Complete

## 2024-05-05 ENCOUNTER — Telehealth: Payer: Self-pay | Admitting: Cardiology

## 2024-05-05 NOTE — Telephone Encounter (Signed)
 Son is returning call

## 2024-05-05 NOTE — Telephone Encounter (Signed)
Pt's son returning nurses call. Please advise 

## 2024-05-05 NOTE — Telephone Encounter (Signed)
 S/w the pt's son (dpr) and went over all directions below, he verbalized understanding:  Theresa Mendoza, April, CMA to Shar[facility] Bellissimo Brusca     05/02/24  2:33 PM Implantable Device Instructions   Akshaya Toepfer Pickar   You are scheduled for a Permanent transvenous pacemaker (PPM) on Wednesday, October 22 with Dr. Sidra Kitty.   [facility] - Main Entrance A                                                                                                                                                                   [address]                                                                                                                                                                                         [place], [place] [address]   Arrival Time: 12:30 PM   Special note: Every effort is made to have your procedure done on time. Please understand that emergencies sometimes delay  scheduled procedures. ___________________________________________________________________   Before the Procedure:   1) Labs: Completed   2) For your safety, and to allow us  to monitor your vital signs accurately during the surgery/procedure we request that if you have artificial nails, gel coating, SNS etc. Please have those removed prior to your surgery/procedure.    3) Medication Instructions:   HOLD: Eliquis  (Apixaban ) for 2 day(s) prior to your procedure. Your last dose will be Sunday, October 19, PM dose.   HOLD: Jardiance  for 3 days prior to your procedure. Your last dose will be Saturday, October 19.       4) Please notify your provider if you are started on any new medications prior to your procedure.   5) The night before your procedure and the morning of your procedure scrub your neck/chest with CHG surgical scrub.  See below for further instructions.   ________________________________________________________________  Day of Procedure:   1) NO eating or drinking after midnight  (NPO after 12:00am) except for sips of water with your medications. HOLD: Metformin , Lasix  and medications listed above the morning of your procedure.    2)  Free valet parking service is available. You will check in at ADMITTING. The support person will be asked to wait in the waiting room.  It is OK to have someone drop you off and come back when you are ready to be discharged.      3) Plan to go home the same day, you will only stay overnight if medically necessary.   4) You MUST have a responsible adult to drive you home.   5)  An adult MUST be with you the first 24 hours after you arrive home.   6) Bring a current list of your medications, and the last time and date medication taken.   7) Bring ID and current insurance cards.   8) Please wear clothes that are easy to get on and off and wear slip-on shoes.   _________________________________________________________________   After  Procedure:   1) You will follow up with the Device Clinic 10-14 days after your procedure. You will follow up with Dr. Sidra Kitty or EP APP 91 days after your procedure.  These appointments will be made for you.   *If you have ANY questions, please call the office at [phone] or send a MyChart message*   _________________________________________________________________     [facility] - Preparing For Surgery     Before surgery, you can play an important role. Because skin is not sterile, your skin needs to be as free of germs as possible. You can reduce the number of germs on your skin by washing with CHG (chlorahexidine gluconate) Soap before surgery.  CHG is an antiseptic cleaner which kills germs and bonds with the skin to continue killing germs even after washing.     Please do not use if you have an allergy to CHG or antibacterial soaps.  If your skin becomes reddened/irritated stop using the CHG.   Do not shave (including legs and underarms) for at least 48 hours prior to first CHG shower.   It is OK to shave your face.   Please follow these instructions carefully: 1. Shower the night before surgery and the morning of surgery with CHG.   2. If you choose to wash your hair, wash your hair first as usual with your normal shampoo.   3. After you shampoo, rinse your hair and body thoroughly to remove the shampoo.   4. Use CHG as you would any other liquid soap.  You can apply CHG directly to the skin and wash gently with a clean washcloth.   5. Apply the CHG Soap to your body ONLY FROM THE NECK DOWN.  Do not use on open wounds or open sores.  Avoid contact with your eyes, ears, mouth and genitals (private parts).  Wash genitals (private parts) with your normal soap.   6. Wash thoroughly, paying special attention to the area where your surgery will be performed.   7. Thoroughly rinse your body with warm water from the neck down.    8. DO NOT shower/wash with your normal soap after using and rinsing off the CHG soap.   9. Pat yourself dry with a clean towel.   10. Wear clean pajamas.   11. Place clean sheets on your bed the night of your first shower and do not  sleep with pets.   Day of Surgery: Do not apply any deodorants/lotions.  Please wear clean clothes to the hospital/surgery center.  This MyChart message has not been read.

## 2024-05-05 NOTE — Telephone Encounter (Signed)
 Left message and call back number. Informed that the instructions are also available over MyChart.

## 2024-05-05 NOTE — Telephone Encounter (Signed)
 Theresa Mendoza) stated patient misplaced her instructions for upcoming procedure.  Theresa wants a call back to get instructions.

## 2024-05-05 NOTE — Telephone Encounter (Signed)
 Left message to call back.

## 2024-05-06 ENCOUNTER — Telehealth: Payer: Self-pay | Admitting: Physician Assistant

## 2024-05-06 NOTE — Telephone Encounter (Signed)
 Reached out to the patient to discuss potential change ins schedule tomorrow changing implanting MD from Dr. Kennyth to Dr. Inocencio Sermon with [name], the patient's son and POA who reports that his Mom has dementia and would not have remembered Dr. Kennyth anyway or have apreference. He (they) are happy to change procedure time and implanting MD Procedure PPM implant will now be with Dr. Inocencio @ 5:30PM Discussed clear liquids OK until 1000 New arrival time 3:30PM  Charlies Arthur, PA-C

## 2024-05-07 ENCOUNTER — Ambulatory Visit (HOSPITAL_COMMUNITY)
Admission: RE | Disposition: A | Discharge status: Home / Self Care | Payer: Self-pay | Source: Home / Self Care | Attending: Cardiology

## 2024-05-07 ENCOUNTER — Ambulatory Visit (HOSPITAL_COMMUNITY)
Admission: RE | Admit: 2024-05-07 | Discharge: 2024-05-07 | Disposition: A | Discharge status: Home / Self Care | Attending: Cardiology | Admitting: Cardiology

## 2024-05-07 ENCOUNTER — Other Ambulatory Visit: Payer: Self-pay

## 2024-05-07 ENCOUNTER — Ambulatory Visit (HOSPITAL_COMMUNITY)

## 2024-05-07 DIAGNOSIS — I495 Sick sinus syndrome: Secondary | ICD-10-CM | POA: Diagnosis not present

## 2024-05-07 DIAGNOSIS — I4891 Unspecified atrial fibrillation: Secondary | ICD-10-CM | POA: Insufficient documentation

## 2024-05-07 DIAGNOSIS — Z7901 Long term (current) use of anticoagulants: Secondary | ICD-10-CM | POA: Diagnosis not present

## 2024-05-07 DIAGNOSIS — I4819 Other persistent atrial fibrillation: Secondary | ICD-10-CM | POA: Diagnosis not present

## 2024-05-07 HISTORY — PX: PACEMAKER IMPLANT: EP1218

## 2024-05-07 LAB — GLUCOSE, CAPILLARY: Glucose-Capillary: 118 mg/dL — ABNORMAL HIGH (ref 70–99)

## 2024-05-07 SURGERY — PACEMAKER IMPLANT

## 2024-05-07 MED ORDER — POVIDONE-IODINE 10 % EX SWAB
2.0000 | Freq: Once | CUTANEOUS | Status: AC
  Administered 2024-05-07: 2 via TOPICAL

## 2024-05-07 MED ORDER — HEPARIN (PORCINE) IN NACL 1000-0.9 UT/500ML-% IV SOLN
INTRAVENOUS | Status: DC | PRN
  Administered 2024-05-07: 500 mL

## 2024-05-07 MED ORDER — SODIUM CHLORIDE 0.9 % IV SOLN: 80.0000 mg | INTRAVENOUS | Status: AC

## 2024-05-07 MED ORDER — CHLORHEXIDINE GLUCONATE 4 % EX SOLN
4.0000 | Freq: Once | CUTANEOUS | Status: DC
  Filled 2024-05-07: qty 60

## 2024-05-07 MED ORDER — CEFAZOLIN SODIUM-DEXTROSE 2-4 GM/100ML-% IV SOLN: 2.0000 g | INTRAVENOUS | Status: AC

## 2024-05-07 MED ORDER — SODIUM CHLORIDE 0.9 % IV SOLN
INTRAVENOUS | Status: AC
  Administered 2024-05-07: 80 mg
  Filled 2024-05-07: qty 2

## 2024-05-07 MED ORDER — FENTANYL CITRATE (PF) 100 MCG/2ML IJ SOLN
INTRAMUSCULAR | Status: DC | PRN
  Administered 2024-05-07 (×2): 25 ug via INTRAVENOUS

## 2024-05-07 MED ORDER — SODIUM CHLORIDE 0.9% FLUSH: 3.0000 mL | INTRAVENOUS | Status: DC | PRN

## 2024-05-07 MED ORDER — SODIUM CHLORIDE 0.9 % IV SOLN: INTRAVENOUS | Status: DC

## 2024-05-07 MED ORDER — FENTANYL CITRATE (PF) 100 MCG/2ML IJ SOLN
INTRAMUSCULAR | Status: AC
  Filled 2024-05-07: qty 2

## 2024-05-07 MED ORDER — LIDOCAINE HCL (PF) 1 % IJ SOLN
INTRAMUSCULAR | Status: DC | PRN
  Administered 2024-05-07: 60 mL
  Administered 2024-05-07: 30 mL

## 2024-05-07 MED ORDER — CEFAZOLIN SODIUM-DEXTROSE 2-4 GM/100ML-% IV SOLN
INTRAVENOUS | Status: AC
  Administered 2024-05-07: 2 g via INTRAVENOUS
  Filled 2024-05-07: qty 100

## 2024-05-07 MED ORDER — MIDAZOLAM HCL 2 MG/2ML IJ SOLN
INTRAMUSCULAR | Status: AC
  Filled 2024-05-07: qty 2

## 2024-05-07 MED ORDER — ACETAMINOPHEN 325 MG PO TABS: 325.0000 mg | ORAL_TABLET | ORAL | Status: DC | PRN

## 2024-05-07 MED ORDER — SODIUM CHLORIDE 0.9% FLUSH: 3.0000 mL | Freq: Two times a day (BID) | INTRAVENOUS | Status: DC

## 2024-05-07 MED ORDER — MIDAZOLAM HCL 5 MG/5ML IJ SOLN
INTRAMUSCULAR | Status: DC | PRN
  Administered 2024-05-07 (×2): 1 mg via INTRAVENOUS

## 2024-05-07 MED ORDER — ONDANSETRON HCL 4 MG/2ML IJ SOLN: 4.0000 mg | Freq: Four times a day (QID) | INTRAMUSCULAR | Status: DC | PRN

## 2024-05-07 SURGICAL SUPPLY — 12 items
CABLE SURGICAL S-101-97-12 (CABLE) ×1 IMPLANT
CATH CPS LOCATOR 3D MED (CATHETERS) IMPLANT
LEAD ULTIPACE 65 LPA1231/65 (Lead) IMPLANT
PACEMAKER ASSURITY SR-SF (Pacemaker) IMPLANT
PAD DEFIB RADIO PHYSIO CONN (PAD) ×1 IMPLANT
SHEATH 7FR PRELUDE SNAP 13 (SHEATH) IMPLANT
SHEATH 9FR PRELUDE SNAP 13 (SHEATH) IMPLANT
SHEATH PROBE COVER 6X72 (BAG) IMPLANT
SLITTER AGILIS HISPRO (INSTRUMENTS) IMPLANT
TOOL HELIX LOCKING (MISCELLANEOUS) IMPLANT
TRAY PACEMAKER INSERTION (PACKS) ×1 IMPLANT
WIRE HI TORQ VERSACORE-J 145CM (WIRE) IMPLANT

## 2024-05-07 NOTE — Discharge Instructions (Addendum)
 After Your Pacemaker   You have a Abbott Pacemaker  If you have a Medtronic or Biotronik device, plug in your home monitor once you get home, and no manual interaction is required.   If you have an [employer] or [employer] device, plug your home monitor once you get home, sit near the device, and press the large activation button. Sit nearby until the process is complete, usually notated by lights on the monitor.   If you were set up for monitoring using an app on your phone, make sure the app remains open in the background and the Bluetooth remains on.  ACTIVITY Do not lift your arm above shoulder height for 1 week after your procedure. After 7 days, you may progress as below.  You should remove your sling 24 hours after your procedure, unless otherwise instructed by your provider.     Wednesday May 14, 2024  Thursday May 15, 2024 Friday May 16, 2024 Saturday May 17, 2024   Do not lift, push, pull, or carry anything over 10 pounds with the affected arm until 6 weeks (Wednesday June 18, 2024 ) after your procedure.   You may drive AFTER your wound check, unless you have been told otherwise by your provider.   Ask your healthcare provider when you can go back to work   INCISION/Dressing If you are on a blood thinner such as Coumadin , Xarelto, Eliquis , Plavix, or Pradaxa please confirm with your provider when this should be resumed.   If large square, outer bandage is left in place, this can be removed after 24 hours from your procedure. Do not remove steri-strips or glue as below.   If a PRESSURE DRESSING (a bulky dressing that usually goes up over your shoulder) was applied or left in place, please follow instructions given by your provider on when to return to have this removed.   Monitor your Pacemaker site for redness, swelling, and drainage. Call the device clinic at [phone] if you experience these symptoms or fever/chills.  If your incision is sealed  with Steri-strips or staples, you may shower 7 days after your procedure or when told by your provider. Do not remove the steri-strips or let the shower hit directly on your site. You may wash around your site with soap and water.    If you were discharged in a sling, please do not wear this during the day more than 48 hours after your surgery unless otherwise instructed. This may increase the risk of stiffness and soreness in your shoulder.   Avoid lotions, ointments, or perfumes over your incision until it is well-healed.  You may use a hot tub or a pool AFTER your wound check appointment if the incision is completely closed.  Pacemaker Alerts:  Some alerts are vibratory and others beep. These are NOT emergencies. Please call our office to let us  know. If this occurs at night or on weekends, it can wait until the next business day. Send a remote transmission.  If your device is capable of reading fluid status (for heart failure), you will be offered monthly monitoring to review this with you.   DEVICE MANAGEMENT Remote monitoring is used to monitor your pacemaker from home. This monitoring is scheduled every 91 days by our office. It allows us  to keep an eye on the functioning of your device to ensure it is working properly. You will routinely see your Electrophysiologist annually (more often if necessary).  This will appear as a REMOTE check on your  MyChart schedule. These are automatic and there is nothing for you to manually do unless otherwise instructed.  You should receive your ID card for your new device in 4-8 weeks. Keep this card with you at all times once received. Consider wearing a medical alert bracelet or necklace.  Your Pacemaker may be MRI compatible. This will be discussed at your next office visit/wound check.  You should avoid contact with strong electric or magnetic fields.   Do not use amateur (ham) radio equipment or electric (arc) welding torches. MP3 player headphones  with magnets should not be used. Some devices are safe to use if held at least 12 inches (30 cm) from your Pacemaker. These include power tools, lawn mowers, and speakers. If you are unsure if something is safe to use, ask your health care provider.  When using your cell phone, hold it to the ear that is on the opposite side from the Pacemaker. Do not leave your cell phone in a pocket over the Pacemaker.  You may safely use electric blankets, heating pads, computers, and microwave ovens.  Call the office right away if: You have chest pain. You feel more short of breath than you have felt before. You feel more light-headed than you have felt before. Your incision starts to open up.  This information is not intended to replace advice given to you by your health care provider. Make sure you discuss any questions you have with your health care provider.

## 2024-05-07 NOTE — Interval H&P Note (Signed)
 History and Physical Interval Note:  05/07/2024 2:15 PM  Theresa Mendoza  has presented today for surgery, with the diagnosis of afib.  The various methods of treatment have been discussed with the patient and family. After consideration of risks, benefits and other options for treatment, the patient has consented to  Procedure(s): PACEMAKER IMPLANT (N/A) as a surgical intervention.  The patient's history has been reviewed, patient examined, no change in status, stable for surgery.  I have reviewed the patient's chart and labs.  Questions were answered to the patient's satisfaction.     Janeann Paisley [employer]

## 2024-05-07 NOTE — Progress Notes (Signed)
 Spoke with Dr. Inocencio informed of XRAY and EKG results. Patient ok to discharge home. Verbal order with read back and confirmation for patient to restart Eliquis  Sunday, October 26,2025. Discharge instructions updated and reviewed with the patient and her son.

## 2024-05-07 NOTE — Progress Notes (Signed)
 Upon preparing patient for surgery patient states she fell and hit her head yesterday 10/21 while at home. States she did not get checked out, skin tear noted to right side of face. Unsure of exactly when she took her last Eliquis , son brought back to verify medications with MD Camntiz. MD Camnitz at bedside to discuss with patient. Patient was also unsure of exactly what time she drank a significant amount of liquids believe it is before 1000 but is often forgetful. MD Camntiz made aware.

## 2024-05-08 ENCOUNTER — Encounter (HOSPITAL_COMMUNITY): Payer: Self-pay | Admitting: Cardiology

## 2024-05-08 MED FILL — Midazolam HCl Inj 2 MG/2ML (Base Equivalent): INTRAMUSCULAR | Qty: 2 | Status: AC

## 2024-05-12 ENCOUNTER — Encounter: Payer: Self-pay | Admitting: Emergency Medicine

## 2024-05-15 ENCOUNTER — Telehealth: Payer: Self-pay | Admitting: Cardiology

## 2024-05-15 NOTE — Telephone Encounter (Signed)
 Reviewed the patient's chart in preparation for her appt on 05/20/24 for her wound check with the Device Clinic. No activation transmission has been received from the patient's home monitor.  Attempted to contact the patient's son, [name], to see if he could assist with a manual transmission for the patient (per chart pt has a dx of dementia).  No answer- I left a message to please call back to the Device Clinic.

## 2024-05-16 NOTE — Telephone Encounter (Signed)
Transmission received.

## 2024-05-19 ENCOUNTER — Ambulatory Visit: Admitting: Physician Assistant

## 2024-05-20 ENCOUNTER — Ambulatory Visit: Payer: Self-pay | Admitting: Cardiology

## 2024-05-20 ENCOUNTER — Ambulatory Visit: Attending: Cardiology

## 2024-05-20 DIAGNOSIS — R001 Bradycardia, unspecified: Secondary | ICD-10-CM | POA: Diagnosis not present

## 2024-05-20 LAB — CUP PACEART INCLINIC DEVICE CHECK
Battery Remaining Longevity: 118 mo
Battery Voltage: 3.05 V
Brady Statistic RV Percent Paced: 4.9 %
Date Time Interrogation Session: 20251104123230
Implantable Lead Connection Status: 753985
Implantable Lead Implant Date: 20251022
Implantable Lead Location: 753860
Implantable Pulse Generator Implant Date: 20251022
Lead Channel Impedance Value: 512.5 Ohm
Lead Channel Pacing Threshold Amplitude: 0.75 V
Lead Channel Pacing Threshold Amplitude: 0.75 V
Lead Channel Pacing Threshold Pulse Width: 0.5 ms
Lead Channel Pacing Threshold Pulse Width: 0.5 ms
Lead Channel Sensing Intrinsic Amplitude: 12 mV
Lead Channel Setting Pacing Amplitude: 2.5 V
Lead Channel Setting Pacing Pulse Width: 0.5 ms
Lead Channel Setting Sensing Sensitivity: 2 mV
Pulse Gen Model: 1272
Pulse Gen Serial Number: 8250520

## 2024-05-20 NOTE — Patient Instructions (Addendum)

## 2024-05-20 NOTE — Progress Notes (Signed)
 Normal dual chamber pacemaker wound check programmed VVI. Presenting rhythm: VS with irregular R-R intervals 70-121. Wound well healed. Routine testing performed. Thresholds, sensing, and impedance consistent with implant measurements. No episodes. Reviewed arm restrictions to continue for 6 weeks total post op.  Pt enrolled in remote follow-up.  Programming changes: RV AC turned off> programmed 2.5 V at 0.5 ms.

## 2024-05-29 ENCOUNTER — Telehealth: Payer: Self-pay

## 2024-05-29 DIAGNOSIS — R001 Bradycardia, unspecified: Secondary | ICD-10-CM

## 2024-05-29 DIAGNOSIS — Z01812 Encounter for preprocedural laboratory examination: Secondary | ICD-10-CM

## 2024-05-29 DIAGNOSIS — I4891 Unspecified atrial fibrillation: Secondary | ICD-10-CM

## 2024-05-29 NOTE — Telephone Encounter (Signed)
 Pt son called in stating that he was supposed to get a call back to schedule her ablation and he wants to know what's going on with that

## 2024-05-29 NOTE — Telephone Encounter (Signed)
 Spoke to son, aware I will discuss where to add pt on Camnitz's schedule.  Informed that I would discuss aiming for first week of December to put pt 6 weeks post implant. Aware will follow up next week once reviewed with Dr. Inocencio. Son is agreeable to plan.

## 2024-06-05 ENCOUNTER — Telehealth: Payer: Self-pay

## 2024-06-05 NOTE — Telephone Encounter (Signed)
 Pt physical therapist let us  know the pt hr was jumping from 145 to 34 beats. Pt is fatigue and sob,

## 2024-06-05 NOTE — Telephone Encounter (Signed)
 Spoke with Pt's physical therapist.  Advised Pt is in atrial fibrillation and heart rate fluctuates with atrial fibrillation.   Advised Pt has a new pacemaker and is awaiting procedure for AV node ablation.  At this point son says he is waiting for a call back to schedule AV node ablation and he has not been contacted yet.  Advised would message nurse to facilitate procedure.  Son thanked nurse for assistance.

## 2024-06-11 NOTE — Telephone Encounter (Signed)
 Son aware will schedule pt for 12/18 and will reach out next week.  He is agreeable to plan.

## 2024-06-18 ENCOUNTER — Ambulatory Visit: Attending: Cardiology

## 2024-06-18 DIAGNOSIS — I4891 Unspecified atrial fibrillation: Secondary | ICD-10-CM

## 2024-06-19 LAB — CUP PACEART REMOTE DEVICE CHECK
Battery Remaining Longevity: 123 mo
Battery Remaining Percentage: 95.5 %
Battery Voltage: 3.04 V
Brady Statistic RV Percent Paced: 3.8 %
Date Time Interrogation Session: 20251203020011
Implantable Lead Connection Status: 753985
Implantable Lead Implant Date: 20251022
Implantable Lead Location: 753860
Implantable Pulse Generator Implant Date: 20251022
Lead Channel Impedance Value: 490 Ohm
Lead Channel Pacing Threshold Amplitude: 0.75 V
Lead Channel Pacing Threshold Pulse Width: 0.5 ms
Lead Channel Sensing Intrinsic Amplitude: 12 mV
Lead Channel Setting Pacing Amplitude: 2.5 V
Lead Channel Setting Pacing Pulse Width: 0.5 ms
Lead Channel Setting Sensing Sensitivity: 2 mV
Pulse Gen Model: 1272
Pulse Gen Serial Number: 8250520

## 2024-06-20 ENCOUNTER — Ambulatory Visit: Payer: Self-pay | Admitting: Cardiology

## 2024-06-23 ENCOUNTER — Telehealth: Payer: Self-pay | Admitting: *Deleted

## 2024-06-23 NOTE — Telephone Encounter (Signed)
 Left message to call back   (Scheduling pt for AVN ablation on 12/18.  When is pt going to get BMET & CBC prior to procedure?  Not sure they have mychart access, how to get instructions to son?)

## 2024-06-24 ENCOUNTER — Encounter: Payer: Self-pay | Admitting: *Deleted

## 2024-06-24 NOTE — Progress Notes (Signed)
 Remote PPM Transmission

## 2024-06-24 NOTE — Telephone Encounter (Signed)
 Spoke to son, briefly reviewed instructions.  He will bring pt to our office on Monday 12/15 for pre procedure lab work.  He will also pick up letter of instructions when here that day.

## 2024-06-24 NOTE — Telephone Encounter (Signed)
 Left message for son to call back to review instructions for upcoming procedure

## 2024-06-24 NOTE — Addendum Note (Signed)
 Addended by: GRETEL MAEOLA CROME on: 06/24/2024 04:47 PM   Modules accepted: Orders

## 2024-06-25 ENCOUNTER — Encounter: Payer: Self-pay | Admitting: *Deleted

## 2024-06-25 NOTE — Telephone Encounter (Signed)
 Late Entry: Spoke to son yesterday about this.  They will stop by the office next Monday for pre procedure lab work and to pick up procedure instructions.

## 2024-06-27 ENCOUNTER — Telehealth: Payer: Self-pay

## 2024-06-27 NOTE — Telephone Encounter (Signed)
-----   Message from Nurse Maeola SQUIBB, RN sent at 06/25/2024  6:27 PM EST ----- Regarding: 07/03/24  AVN ablation Brevyn Ring I have done everything for this pt/procedure (except Kea's part/call)  Precert:  MD: Camnitz Type of ablation: AV node Diagnosis: atrial fibrillation CPT code: AV node (06349) Ablation scheduled (date/time): 07/03/24  1:30 pm  Procedure:  Added to calendar? Yes Orders entered? Yes Letter complete? Yes Scheduled with cath lab? Yes Any medications to hold? routine Labs ordered (CBC, BMET, PT/INR if on warfarin): Yes Mapping system: CARTO (lab 4 or 6) CARTO/OPAL rep notified?  Cardiac CT needed? N/a Dye allergy? N/a Pre-meds ordered and instructions given? N/a Letter method: Patient pick-up H&P:  Device: Yes, PPM-SJM  Follow-up:  Cassie/Angel, please schedule Routine.

## 2024-06-30 NOTE — Telephone Encounter (Signed)
 PT son has questions about procedure

## 2024-06-30 NOTE — Telephone Encounter (Signed)
 Clarified procedure instructions/new medication area/information. He reports pt suffers from dry mouth.  Advised ok for pt to have sips of water to help alleviate/prevent this. He appreciates the return call.

## 2024-07-01 LAB — CBC
Hematocrit: 47.8 % — ABNORMAL HIGH (ref 34.0–46.6)
Hemoglobin: 14.1 g/dL (ref 11.1–15.9)
MCH: 22.1 pg — ABNORMAL LOW (ref 26.6–33.0)
MCHC: 29.5 g/dL — ABNORMAL LOW (ref 31.5–35.7)
MCV: 75 fL — ABNORMAL LOW (ref 79–97)
Platelets: 405 x10E3/uL (ref 150–450)
RBC: 6.39 x10E6/uL — ABNORMAL HIGH (ref 3.77–5.28)
RDW: 18.3 % — ABNORMAL HIGH (ref 11.7–15.4)
WBC: 11.1 x10E3/uL — ABNORMAL HIGH (ref 3.4–10.8)

## 2024-07-01 LAB — BASIC METABOLIC PANEL WITH GFR
BUN/Creatinine Ratio: 15 (ref 12–28)
BUN: 16 mg/dL (ref 8–27)
CO2: 23 mmol/L (ref 20–29)
Calcium: 9.8 mg/dL (ref 8.7–10.3)
Chloride: 98 mmol/L (ref 96–106)
Creatinine, Ser: 1.04 mg/dL — ABNORMAL HIGH (ref 0.57–1.00)
Glucose: 116 mg/dL — ABNORMAL HIGH (ref 70–99)
Potassium: 4.9 mmol/L (ref 3.5–5.2)
Sodium: 141 mmol/L (ref 134–144)
eGFR: 55 mL/min/1.73 — ABNORMAL LOW (ref >59)

## 2024-07-03 ENCOUNTER — Ambulatory Visit (HOSPITAL_COMMUNITY)
Admission: RE | Admit: 2024-07-03 | Discharge: 2024-07-03 | Disposition: A | Discharge status: Home / Self Care | Attending: Cardiology | Admitting: Cardiology

## 2024-07-03 ENCOUNTER — Ambulatory Visit (HOSPITAL_COMMUNITY)

## 2024-07-03 ENCOUNTER — Encounter (HOSPITAL_COMMUNITY): Admission: RE | Disposition: A | Payer: Self-pay | Attending: Cardiology

## 2024-07-03 ENCOUNTER — Other Ambulatory Visit: Payer: Self-pay

## 2024-07-03 DIAGNOSIS — Z79899 Other long term (current) drug therapy: Secondary | ICD-10-CM | POA: Diagnosis not present

## 2024-07-03 DIAGNOSIS — I080 Rheumatic disorders of both mitral and aortic valves: Secondary | ICD-10-CM | POA: Insufficient documentation

## 2024-07-03 DIAGNOSIS — E119 Type 2 diabetes mellitus without complications: Secondary | ICD-10-CM | POA: Diagnosis not present

## 2024-07-03 DIAGNOSIS — J449 Chronic obstructive pulmonary disease, unspecified: Secondary | ICD-10-CM | POA: Insufficient documentation

## 2024-07-03 DIAGNOSIS — I4891 Unspecified atrial fibrillation: Secondary | ICD-10-CM | POA: Diagnosis not present

## 2024-07-03 DIAGNOSIS — Z87891 Personal history of nicotine dependence: Secondary | ICD-10-CM | POA: Insufficient documentation

## 2024-07-03 DIAGNOSIS — F319 Bipolar disorder, unspecified: Secondary | ICD-10-CM | POA: Insufficient documentation

## 2024-07-03 DIAGNOSIS — I252 Old myocardial infarction: Secondary | ICD-10-CM | POA: Insufficient documentation

## 2024-07-03 DIAGNOSIS — I2510 Atherosclerotic heart disease of native coronary artery without angina pectoris: Secondary | ICD-10-CM | POA: Diagnosis not present

## 2024-07-03 DIAGNOSIS — I4892 Unspecified atrial flutter: Secondary | ICD-10-CM | POA: Diagnosis not present

## 2024-07-03 DIAGNOSIS — F4323 Adjustment disorder with mixed anxiety and depressed mood: Secondary | ICD-10-CM

## 2024-07-03 DIAGNOSIS — I10 Essential (primary) hypertension: Secondary | ICD-10-CM | POA: Insufficient documentation

## 2024-07-03 HISTORY — PX: AV NODE ABLATION: EP1193

## 2024-07-03 LAB — GLUCOSE, CAPILLARY
Glucose-Capillary: 136 mg/dL — ABNORMAL HIGH (ref 70–99)
Glucose-Capillary: 140 mg/dL — ABNORMAL HIGH (ref 70–99)

## 2024-07-03 SURGERY — AV NODE ABLATION
Anesthesia: General

## 2024-07-03 MED ORDER — BUPIVACAINE HCL (PF) 0.25 % IJ SOLN
INTRAMUSCULAR | Status: AC
  Filled 2024-07-03: qty 30

## 2024-07-03 MED ORDER — DILTIAZEM HCL ER COATED BEADS 180 MG PO CP24
360.0000 mg | ORAL_CAPSULE | Freq: Once | ORAL | Status: AC
  Administered 2024-07-03: 16:00:00 360 mg via ORAL
  Filled 2024-07-03: qty 2

## 2024-07-03 MED ORDER — ONDANSETRON HCL 4 MG/2ML IJ SOLN: 4.0000 mg | Freq: Four times a day (QID) | INTRAMUSCULAR | Status: DC | PRN

## 2024-07-03 MED ORDER — SODIUM CHLORIDE 0.9% FLUSH: 3.0000 mL | INTRAVENOUS | Status: DC | PRN

## 2024-07-03 MED ORDER — HEPARIN (PORCINE) IN NACL 1000-0.9 UT/500ML-% IV SOLN
INTRAVENOUS | Status: DC | PRN
  Administered 2024-07-03: 14:00:00 500 mL

## 2024-07-03 MED ORDER — MIDAZOLAM HCL 5 MG/5ML IJ SOLN
INTRAMUSCULAR | Status: AC
  Filled 2024-07-03: qty 5

## 2024-07-03 MED ORDER — MIDAZOLAM HCL 5 MG/5ML IJ SOLN
INTRAMUSCULAR | Status: DC | PRN
  Administered 2024-07-03 (×3): 1 mg via INTRAVENOUS

## 2024-07-03 MED ORDER — MORPHINE SULFATE (PF) 2 MG/ML IV SOLN
INTRAVENOUS | Status: DC | PRN
  Administered 2024-07-03 (×2): 1 mg via INTRAVENOUS

## 2024-07-03 MED ORDER — ACETAMINOPHEN 325 MG PO TABS: 650.0000 mg | ORAL_TABLET | ORAL | Status: DC | PRN

## 2024-07-03 MED ORDER — METOPROLOL SUCCINATE ER 100 MG PO TB24: 100.0000 mg | ORAL_TABLET | Freq: Two times a day (BID) | ORAL | 5 refills | Status: AC

## 2024-07-03 MED ORDER — BUPIVACAINE HCL (PF) 0.25 % IJ SOLN
INTRAMUSCULAR | Status: DC | PRN
  Administered 2024-07-03: 14:00:00 20 mL

## 2024-07-03 MED ORDER — FENTANYL CITRATE (PF) 100 MCG/2ML IJ SOLN
INTRAMUSCULAR | Status: AC
  Filled 2024-07-03: qty 2

## 2024-07-03 MED ORDER — SODIUM CHLORIDE 0.9% FLUSH: 3.0000 mL | Freq: Two times a day (BID) | INTRAVENOUS | Status: DC

## 2024-07-03 MED ORDER — SODIUM CHLORIDE 0.9 % IV SOLN: INTRAVENOUS | Status: DC

## 2024-07-03 MED ORDER — FENTANYL CITRATE (PF) 100 MCG/2ML IJ SOLN
INTRAMUSCULAR | Status: DC | PRN
  Administered 2024-07-03 (×3): 25 ug via INTRAVENOUS

## 2024-07-03 MED ORDER — MORPHINE SULFATE (PF) 2 MG/ML IV SOLN
INTRAVENOUS | Status: AC
  Filled 2024-07-03: qty 1

## 2024-07-03 MED ORDER — SODIUM CHLORIDE 0.9 % IV SOLN: 250.0000 mL | INTRAVENOUS | Status: DC | PRN

## 2024-07-03 MED ORDER — CEFAZOLIN SODIUM-DEXTROSE 1-4 GM/50ML-% IV SOLN: 2.0000 g | Freq: Once | INTRAVENOUS | Status: AC

## 2024-07-03 MED ORDER — CEFAZOLIN SODIUM-DEXTROSE 2-4 GM/100ML-% IV SOLN
INTRAVENOUS | Status: AC
  Administered 2024-07-03: 14:00:00 2 g
  Filled 2024-07-03: qty 100

## 2024-07-03 SURGICAL SUPPLY — 8 items
CATH BLAZERPRIME XP LG CV 10MM (ABLATOR) IMPLANT
CLOSURE MYNX CONTROL 6F/7F (Vascular Products) IMPLANT
INTRODUCER SWARTZ SRO 8F (SHEATH) IMPLANT
PACK EP LF (CUSTOM PROCEDURE TRAY) ×1 IMPLANT
PAD DEFIB RADIO PHYSIO CONN (PAD) ×1 IMPLANT
SHEATH AGILIS NXT 8.5F 71CM (SHEATH) IMPLANT
SHEATH PINNACLE 8F 10CM (SHEATH) IMPLANT
SHEATH PROBE COVER 6X72 (BAG) IMPLANT

## 2024-07-03 NOTE — Discharge Instructions (Signed)

## 2024-07-03 NOTE — Progress Notes (Signed)
 Patient and patient son given discharge instructions, education provided no further questions at this time. Patient able to ambulate and void before discharge. Able to tolerate PO intake. Patient site is clean, dry, intact with no hematoma noted upon discharge. Patient was unsuccessful ablation, a-fib RVR heart rate from 110's-160's, per report this was the same in surgery and admission. Verified with PA Tillery about heart rate patient asymptomatic while laying in bed. Ordered at home diltiazem  order  360mg , given no changes after 30 min, called MD Camnitz, per MD no new orders at this time able to get up with bedrest. Patient ambulated 30 feet and became dizzy and states she feels weak. Walked back to bed, MD Camnitz made aware states she is still okay to discharge at this time, IV removed patient discharged.

## 2024-07-03 NOTE — H&P (View-Only) (Signed)
° °  Progress Note  Patient Name: Theresa Mendoza Date of Encounter: 07/03/2024  Primary Cardiologist: Wilbert Bihari, MD   Subjective   Today, denies symptoms of palpitations, chest pain, dyspnea, orthopnea, PND, lower extremity edema, claudication, dizziness, presyncope, syncope, bleeding, or neurologic sequela. The patient is tolerating medications without difficulties. Plan AVN ablation today.   Inpatient Medications    Scheduled Meds:   Continuous Infusions:  sodium chloride  40 mL/hr at 07/03/24 1230   PRN Meds:    Vital Signs    Vitals:   07/03/24 1223  BP: (!) 116/96  Pulse: (!) 133  Resp: 17  Temp: 98.1 F (36.7 C)  TempSrc: Oral  SpO2: 96%  Weight: 74.8 kg  Height: 5' 5 (1.651 m)   No intake or output data in the 24 hours ending 07/03/24 1235  Filed Weights   07/03/24 1223  Weight: 74.8 kg    Telemetry    Atrial fib with a controlled Vr - Personally Reviewed  ECG    none - Personally Reviewed  Physical Exam  GEN: Well nourished, well developed in no acute distress NECK: No JVD; No carotid bruits CARDIAC: Regular rate and rhythm, no murmurs, rubs, gallops RESPIRATORY:  Clear to auscultation without rales, wheezing or rhonchi  ABDOMEN: Soft, non-tender, non-distended EXTREMITIES:  No edema; No deformity    Labs    Chemistry Recent Labs  Lab 06/30/24 1447  NA 141  K 4.9  CL 98  CO2 23  GLUCOSE 116*  BUN 16  CREATININE 1.04*  CALCIUM  9.8     Hematology Recent Labs  Lab 06/30/24 1447  WBC 11.1*  RBC 6.39*  HGB 14.1  HCT 47.8*  MCV 75*  MCH 22.1*  MCHC 29.5*  RDW 18.3*  PLT 405    Cardiac EnzymesNo results for input(s): TROPONINI in the last 168 hours. No results for input(s): TROPIPOC in the last 168 hours.   BNP No results for input(s): BNP, PROBNP in the last 168 hours.    DDimer No results for input(s): DDIMER in the last 168 hours.   Radiology    No results found.  Cardiac Studies    none  Patient Profile     78 y.o. female admitted with afib and a RVR  Assessment & Plan    Atrial fib with a RVR - Theresa Mendoza has presented today for surgery, with the diagnosis of AF.  The various methods of treatment have been discussed with the patient and family. After consideration of risks, benefits and other options for treatment, the patient has consented to  Procedure(s): Catheter ablation as a surgical intervention .  Risks include but not limited to complete heart block, stroke, esophageal damage, nerve damage, bleeding, vascular damage, tamponade, perforation, MI, and death. The patient's history has been reviewed, patient examined, no change in status, stable for surgery.  I have reviewed the patient's chart and labs.  Questions were answered to the patient's satisfaction.    Theresa Boerema Inocencio, MD 07/03/2024 12:36 PM

## 2024-07-03 NOTE — Anesthesia Preprocedure Evaluation (Addendum)
 Anesthesia Evaluation  Patient identified by MRN, date of birth, ID band  Reviewed: Allergy & Precautions, NPO status , Patient's Chart, lab work & pertinent test results  Airway        Dental   Pulmonary COPD, Current Smoker and Patient abstained from smoking.          Cardiovascular hypertension, Pt. on medications + CAD and + Past MI  + dysrhythmias Atrial Fibrillation + Valvular Problems/Murmurs (Moderate MS, Moderate AS)   TTE (09/2023):  1. Left ventricular ejection fraction, by estimation, is 60 to 65%. The  left ventricle has normal function. The left ventricle has no regional  wall motion abnormalities. Left ventricular diastolic parameters are  indeterminate.   2. Right ventricular systolic function is normal. The right ventricular  size is normal. There is moderately elevated pulmonary artery systolic  pressure. The estimated right ventricular systolic pressure is 45.9 mmHg.   3. Left atrial size was mildly dilated.   4. Right atrial size was mildly dilated.   5. The mitral valve is degenerative. No evidence of mitral valve  regurgitation. Mild mitral stenosis. The mean mitral valve gradient is 6.0  mmHg with MVA 2.1 cm^2 by VTI. Moderate mitral annular and valvular  calcification.   6. The aortic valve is tricuspid. There is severe calcifcation of the  aortic valve. Aortic valve regurgitation is mild. Moderate aortic valve  stenosis. Aortic valve area, by VTI measures 1.36 cm. Aortic valve mean  gradient measures 33.0 mmHg.   7. The inferior vena cava is dilated in size with <50% respiratory  variability, suggesting right atrial pressure of 15 mmHg.   8. The patient was in atrial fibrillation.     Neuro/Psych  PSYCHIATRIC DISORDERS Anxiety Depression Bipolar Disorder      GI/Hepatic   Endo/Other  diabetes, Type 2    Renal/GU      Musculoskeletal  (+) Arthritis , Rheumatoid disorders,    Abdominal   Peds   Hematology   Anesthesia Other Findings   Reproductive/Obstetrics                              Anesthesia Physical Anesthesia Plan  ASA:   Anesthesia Plan:    Post-op Pain Management:    Induction:   PONV Risk Score and Plan:   Airway Management Planned:   Additional Equipment:   Intra-op Plan:   Post-operative Plan:   Informed Consent:   Plan Discussed with:   Anesthesia Plan Comments: (Discussed with surgeon. Anesthesia not needed for case.)        Anesthesia Quick Evaluation

## 2024-07-03 NOTE — H&P (Signed)
° °  Progress Note  Patient Name: Theresa Mendoza Date of Encounter: 07/03/2024  Primary Cardiologist: Wilbert Bihari, MD   Subjective   Today, denies symptoms of palpitations, chest pain, dyspnea, orthopnea, PND, lower extremity edema, claudication, dizziness, presyncope, syncope, bleeding, or neurologic sequela. The patient is tolerating medications without difficulties. Plan AVN ablation today.   Inpatient Medications    Scheduled Meds:   Continuous Infusions:  sodium chloride  40 mL/hr at 07/03/24 1230   PRN Meds:    Vital Signs    Vitals:   07/03/24 1223  BP: (!) 116/96  Pulse: (!) 133  Resp: 17  Temp: 98.1 F (36.7 C)  TempSrc: Oral  SpO2: 96%  Weight: 74.8 kg  Height: 5' 5 (1.651 m)   No intake or output data in the 24 hours ending 07/03/24 1235  Filed Weights   07/03/24 1223  Weight: 74.8 kg    Telemetry    Atrial fib with a controlled Vr - Personally Reviewed  ECG    none - Personally Reviewed  Physical Exam  GEN: Well nourished, well developed in no acute distress NECK: No JVD; No carotid bruits CARDIAC: Regular rate and rhythm, no murmurs, rubs, gallops RESPIRATORY:  Clear to auscultation without rales, wheezing or rhonchi  ABDOMEN: Soft, non-tender, non-distended EXTREMITIES:  No edema; No deformity    Labs    Chemistry Recent Labs  Lab 06/30/24 1447  NA 141  K 4.9  CL 98  CO2 23  GLUCOSE 116*  BUN 16  CREATININE 1.04*  CALCIUM  9.8     Hematology Recent Labs  Lab 06/30/24 1447  WBC 11.1*  RBC 6.39*  HGB 14.1  HCT 47.8*  MCV 75*  MCH 22.1*  MCHC 29.5*  RDW 18.3*  PLT 405    Cardiac EnzymesNo results for input(s): TROPONINI in the last 168 hours. No results for input(s): TROPIPOC in the last 168 hours.   BNP No results for input(s): BNP, PROBNP in the last 168 hours.    DDimer No results for input(s): DDIMER in the last 168 hours.   Radiology    No results found.  Cardiac Studies    none  Patient Profile     78 y.o. female admitted with afib and a RVR  Assessment & Plan    Atrial fib with a RVR - Theresa Mendoza has presented today for surgery, with the diagnosis of AF.  The various methods of treatment have been discussed with the patient and family. After consideration of risks, benefits and other options for treatment, the patient has consented to  Procedure(s): Catheter ablation as a surgical intervention .  Risks include but not limited to complete heart block, stroke, esophageal damage, nerve damage, bleeding, vascular damage, tamponade, perforation, MI, and death. The patient's history has been reviewed, patient examined, no change in status, stable for surgery.  I have reviewed the patient's chart and labs.  Questions were answered to the patient's satisfaction.    Theresa Boerema Inocencio, MD 07/03/2024 12:36 PM

## 2024-07-04 ENCOUNTER — Encounter (HOSPITAL_COMMUNITY): Payer: Self-pay | Admitting: Cardiology

## 2024-07-04 ENCOUNTER — Telehealth (HOSPITAL_COMMUNITY): Payer: Self-pay

## 2024-07-04 NOTE — Telephone Encounter (Signed)
 Spoke with patient's son, [name] Cross, to complete post procedure follow up call/ DPR on file. He reports no complications with groin sites.   Instructions reviewed:  Remove large bandage at puncture site after 24 hours. It is normal to have bruising, tenderness, mild swelling, and a pea or marble sized lump/knot at the groin site which can take up to three months to resolve.  Get help right away if you notice sudden swelling at the puncture site.  Check your puncture site every day for signs of infection: fever, redness, swelling, pus drainage, warmth, foul odor or excessive pain. If this occurs, please call [phone], to speak with the RN Navigator. Get help right away if your puncture site is bleeding and the bleeding does not stop after applying firm pressure to the area.  Patient may continue to have skipped beats. Take medication as prescribed.  It is very important not to miss any doses of your blood thinner Eliquis . Activity restrictions reviewed. Patient should be receiving a call from EP procedure scheduler regarding next procedure date.  [name] verbalized understanding to all instructions provided.

## 2024-07-08 ENCOUNTER — Encounter (HOSPITAL_COMMUNITY): Payer: Self-pay

## 2024-07-11 NOTE — Pre-Procedure Instructions (Signed)
 Spoke with son [name].  Reviewed the following items: Arrival time 1130 Nothing to eat or drink after midnight No meds AM of procedure Responsible person to drive you home and stay with you for 24 hrs  Have you missed any doses of anti-coagulant Eliquis - last dose tonight.

## 2024-07-13 NOTE — Anesthesia Preprocedure Evaluation (Signed)
 "                                  Anesthesia Evaluation  Patient identified by MRN, date of birth, ID band Patient awake    Reviewed: Allergy & Precautions, H&P , NPO status , Patient's Chart, lab work & pertinent test results  Airway Mallampati: II  TM Distance: >3 FB Neck ROM: Full    Dental no notable dental hx. (+) Poor Dentition, Chipped, Missing   Pulmonary neg pulmonary ROS, COPD, Current Smoker and Patient abstained from smoking.   Pulmonary exam normal breath sounds clear to auscultation       Cardiovascular Exercise Tolerance: Good hypertension, Pt. on medications and Pt. on home beta blockers + CAD, + Past MI and +CHF  negative cardio ROS Normal cardiovascular exam+ dysrhythmias Atrial Fibrillation + Valvular Problems/Murmurs (Moderate MS, Moderate AS)  Rhythm:Regular Rate:Normal  TTE (09/2023):  1. Left ventricular ejection fraction, by estimation, is 60 to 65%. The  left ventricle has normal function. The left ventricle has no regional  wall motion abnormalities. Left ventricular diastolic parameters are  indeterminate.   2. Right ventricular systolic function is normal. The right ventricular  size is normal. There is moderately elevated pulmonary artery systolic  pressure. The estimated right ventricular systolic pressure is 45.9 mmHg.   3. Left atrial size was mildly dilated.   4. Right atrial size was mildly dilated.   5. The mitral valve is degenerative. No evidence of mitral valve  regurgitation. Mild mitral stenosis. The mean mitral valve gradient is 6.0  mmHg with MVA 2.1 cm^2 by VTI. Moderate mitral annular and valvular  calcification.   6. The aortic valve is tricuspid. There is severe calcifcation of the  aortic valve. Aortic valve regurgitation is mild. Moderate aortic valve  stenosis. Aortic valve area, by VTI measures 1.36 cm. Aortic valve mean  gradient measures 33.0 mmHg.   7. The inferior vena cava is dilated in size with <50%  respiratory  variability, suggesting right atrial pressure of 15 mmHg.   8. The patient was in atrial fibrillation.     Neuro/Psych  PSYCHIATRIC DISORDERS Anxiety Depression Bipolar Disorder   negative neurological ROS  negative psych ROS   GI/Hepatic negative GI ROS, Neg liver ROS,,,  Endo/Other  negative endocrine ROSdiabetes, Type 2    Renal/GU Renal diseasenegative Renal ROS  negative genitourinary   Musculoskeletal negative musculoskeletal ROS (+) Arthritis , Rheumatoid disorders,    Abdominal   Peds negative pediatric ROS (+)  Hematology negative hematology ROS (+)   Anesthesia Other Findings   Reproductive/Obstetrics negative OB ROS                              Anesthesia Physical Anesthesia Plan  ASA: 3  Anesthesia Plan: MAC   Post-op Pain Management: Tylenol  PO (pre-op)*   Induction: Intravenous  PONV Risk Score and Plan: 3 and Ondansetron , Dexamethasone and Treatment may vary due to age or medical condition  Airway Management Planned: Natural Airway and Simple Face Mask  Additional Equipment: None  Intra-op Plan:   Post-operative Plan: Extubation in OR  Informed Consent: I have reviewed the patients History and Physical, chart, labs and discussed the procedure including the risks, benefits and alternatives for the proposed anesthesia with the patient or authorized representative who has indicated his/her understanding and acceptance.  Plan Discussed with: Anesthesiologist  Anesthesia Plan Comments:          Anesthesia Quick Evaluation  "

## 2024-07-14 ENCOUNTER — Ambulatory Visit (HOSPITAL_COMMUNITY)
Admission: RE | Admit: 2024-07-14 | Discharge: 2024-07-14 | Disposition: A | Discharge status: Home / Self Care | Attending: Cardiology | Admitting: Cardiology

## 2024-07-14 ENCOUNTER — Ambulatory Visit (HOSPITAL_BASED_OUTPATIENT_CLINIC_OR_DEPARTMENT_OTHER): Payer: Self-pay | Admitting: Anesthesiology

## 2024-07-14 ENCOUNTER — Ambulatory Visit (HOSPITAL_COMMUNITY): Payer: Self-pay | Admitting: Anesthesiology

## 2024-07-14 ENCOUNTER — Other Ambulatory Visit: Payer: Self-pay

## 2024-07-14 ENCOUNTER — Ambulatory Visit (HOSPITAL_COMMUNITY)
Admission: RE | Disposition: A | Discharge status: Home / Self Care | Payer: Self-pay | Source: Home / Self Care | Attending: Cardiology

## 2024-07-14 DIAGNOSIS — E1122 Type 2 diabetes mellitus with diabetic chronic kidney disease: Secondary | ICD-10-CM | POA: Diagnosis not present

## 2024-07-14 DIAGNOSIS — I110 Hypertensive heart disease with heart failure: Secondary | ICD-10-CM

## 2024-07-14 DIAGNOSIS — I2510 Atherosclerotic heart disease of native coronary artery without angina pectoris: Secondary | ICD-10-CM

## 2024-07-14 DIAGNOSIS — I5042 Chronic combined systolic (congestive) and diastolic (congestive) heart failure: Secondary | ICD-10-CM | POA: Diagnosis not present

## 2024-07-14 DIAGNOSIS — N189 Chronic kidney disease, unspecified: Secondary | ICD-10-CM | POA: Insufficient documentation

## 2024-07-14 DIAGNOSIS — J449 Chronic obstructive pulmonary disease, unspecified: Secondary | ICD-10-CM | POA: Diagnosis not present

## 2024-07-14 DIAGNOSIS — I4891 Unspecified atrial fibrillation: Secondary | ICD-10-CM | POA: Diagnosis present

## 2024-07-14 DIAGNOSIS — I130 Hypertensive heart and chronic kidney disease with heart failure and stage 1 through stage 4 chronic kidney disease, or unspecified chronic kidney disease: Secondary | ICD-10-CM | POA: Insufficient documentation

## 2024-07-14 DIAGNOSIS — F17200 Nicotine dependence, unspecified, uncomplicated: Secondary | ICD-10-CM | POA: Diagnosis not present

## 2024-07-14 DIAGNOSIS — I509 Heart failure, unspecified: Secondary | ICD-10-CM | POA: Diagnosis not present

## 2024-07-14 DIAGNOSIS — I4892 Unspecified atrial flutter: Secondary | ICD-10-CM | POA: Insufficient documentation

## 2024-07-14 DIAGNOSIS — I252 Old myocardial infarction: Secondary | ICD-10-CM | POA: Diagnosis not present

## 2024-07-14 HISTORY — PX: AV NODE ABLATION: EP1193

## 2024-07-14 LAB — GLUCOSE, CAPILLARY: Glucose-Capillary: 147 mg/dL — ABNORMAL HIGH (ref 70–99)

## 2024-07-14 SURGERY — AV NODE ABLATION
Anesthesia: Monitor Anesthesia Care

## 2024-07-14 MED ORDER — HEPARIN (PORCINE) IN NACL 1000-0.9 UT/500ML-% IV SOLN
INTRAVENOUS | Status: DC | PRN
  Administered 2024-07-14 (×2): 500 mL

## 2024-07-14 MED ORDER — SODIUM CHLORIDE 0.9 % IV SOLN: 250.0000 mL | INTRAVENOUS | Status: DC | PRN

## 2024-07-14 MED ORDER — ACETAMINOPHEN 325 MG PO TABS
650.0000 mg | ORAL_TABLET | ORAL | Status: DC | PRN
  Administered 2024-07-14: 650 mg via ORAL

## 2024-07-14 MED ORDER — BUPIVACAINE HCL (PF) 0.25 % IJ SOLN
INTRAMUSCULAR | Status: AC
  Filled 2024-07-14: qty 30

## 2024-07-14 MED ORDER — ACETAMINOPHEN 325 MG PO TABS
ORAL_TABLET | ORAL | Status: AC
  Filled 2024-07-14: qty 2

## 2024-07-14 MED ORDER — SODIUM CHLORIDE 0.9% FLUSH: 3.0000 mL | INTRAVENOUS | Status: DC | PRN

## 2024-07-14 MED ORDER — ONDANSETRON HCL 4 MG/2ML IJ SOLN: 4.0000 mg | Freq: Four times a day (QID) | INTRAMUSCULAR | Status: DC | PRN

## 2024-07-14 MED ORDER — HEPARIN SODIUM (PORCINE) 1000 UNIT/ML IJ SOLN
INTRAMUSCULAR | Status: AC
  Filled 2024-07-14: qty 10

## 2024-07-14 MED ORDER — FENTANYL CITRATE (PF) 100 MCG/2ML IJ SOLN
INTRAMUSCULAR | Status: DC | PRN
  Administered 2024-07-14: 25 ug via INTRAVENOUS
  Administered 2024-07-14: 50 ug via INTRAVENOUS

## 2024-07-14 MED ORDER — SODIUM CHLORIDE 0.9 % IV SOLN: INTRAVENOUS | Status: DC

## 2024-07-14 MED ORDER — FENTANYL CITRATE (PF) 100 MCG/2ML IJ SOLN
INTRAMUSCULAR | Status: AC
  Filled 2024-07-14: qty 2

## 2024-07-14 MED ORDER — LIDOCAINE 2% (20 MG/ML) 5 ML SYRINGE
INTRAMUSCULAR | Status: DC | PRN
  Administered 2024-07-14: 60 mg via INTRAVENOUS

## 2024-07-14 MED ORDER — CEFAZOLIN SODIUM-DEXTROSE 2-4 GM/100ML-% IV SOLN
INTRAVENOUS | Status: AC
  Filled 2024-07-14: qty 100

## 2024-07-14 MED ORDER — PROPOFOL 500 MG/50ML IV EMUL
INTRAVENOUS | Status: DC | PRN
  Administered 2024-07-14: 100 ug/kg/min via INTRAVENOUS

## 2024-07-14 MED ORDER — ATROPINE SULFATE 1 MG/10ML IJ SOSY
PREFILLED_SYRINGE | INTRAMUSCULAR | Status: AC
  Filled 2024-07-14: qty 10

## 2024-07-14 MED ORDER — EPHEDRINE SULFATE-NACL 50-0.9 MG/10ML-% IV SOSY
PREFILLED_SYRINGE | INTRAVENOUS | Status: DC | PRN
  Administered 2024-07-14: 7.5 mg via INTRAVENOUS

## 2024-07-14 MED ORDER — PHENYLEPHRINE HCL-NACL 20-0.9 MG/250ML-% IV SOLN
INTRAVENOUS | Status: DC | PRN
  Administered 2024-07-14: 35 ug/min via INTRAVENOUS

## 2024-07-14 MED ORDER — CEFAZOLIN SODIUM-DEXTROSE 2-3 GM-%(50ML) IV SOLR
INTRAVENOUS | Status: DC | PRN
  Administered 2024-07-14: 2 g via INTRAVENOUS

## 2024-07-14 SURGICAL SUPPLY — 8 items
CATH SMTCH THERMOCOOL SF DF (CATHETERS) IMPLANT
CLOSURE MYNX CONTROL 6F/7F (Vascular Products) IMPLANT
PACK EP LF (CUSTOM PROCEDURE TRAY) ×1 IMPLANT
PAD DEFIB RADIO PHYSIO CONN (PAD) ×1 IMPLANT
PATCH CARTO3 (PAD) IMPLANT
SHEATH PINNACLE 8F 10CM (SHEATH) IMPLANT
SHEATH PROBE COVER 6X72 (BAG) IMPLANT
TUBING SMART ABLATE COOLFLOW (TUBING) IMPLANT

## 2024-07-14 NOTE — Anesthesia Postprocedure Evaluation (Signed)
"   Anesthesia Post Note  Patient: Christopher Hink Osinski  Procedure(s) Performed: AV NODE ABLATION     Patient location during evaluation: PACU Anesthesia Type: MAC Level of consciousness: awake and alert Pain management: pain level controlled Vital Signs Assessment: post-procedure vital signs reviewed and stable Respiratory status: spontaneous breathing, nonlabored ventilation, respiratory function stable and patient connected to nasal cannula oxygen  Cardiovascular status: stable and blood pressure returned to baseline Postop Assessment: no apparent nausea or vomiting Anesthetic complications: no   There were no known notable events for this encounter.  Last Vitals:  Vitals:   07/14/24 1530 07/14/24 1600  BP: (!) 140/64 110/63  Pulse: 88 90  Resp: (!) 31 (!) 29  Temp:    SpO2: 91% 93%    Last Pain:  Vitals:   07/14/24 1426  TempSrc:   PainSc: 4    Pain Goal:                   Karol Skarzynski      "

## 2024-07-14 NOTE — Interval H&P Note (Signed)
 History and Physical Interval Note:  07/14/2024 12:16 PM  Theresa Mendoza  has presented today for surgery, with the diagnosis of av node.  The various methods of treatment have been discussed with the patient and family. After consideration of risks, benefits and other options for treatment, the patient has consented to  Procedures: AV NODE ABLATION (N/A) as a surgical intervention.  The patient's history has been reviewed, patient examined, no change in status, stable for surgery.  I have reviewed the patient's chart and labs.  Questions were answered to the patient's satisfaction.     Javeion Cannedy [employer]

## 2024-07-14 NOTE — Progress Notes (Signed)
 Dr. Inocencio at the bedside to discuss discharge instructions with patient. Questions asked and answered. (Son also at the bedside with questions.) Patient to be discharged at 1645, after three hours of bed rest.  No active bleeding noted from femoral site.

## 2024-07-14 NOTE — Discharge Instructions (Addendum)
 Cardiac Ablation, Care After  This sheet gives you information about how to care for yourself after your procedure. Your health care provider may also give you more specific instructions. If you have problems or questions, contact your health care provider. What can I expect after the procedure? After the procedure, it is common to have: Bruising around your puncture site. Tenderness around your puncture site. Skipped heartbeats. If you had an atrial fibrillation ablation, you may have atrial fibrillation during the first several months after your procedure.  Tiredness (fatigue).  Follow these instructions at home: Puncture site care  Follow instructions from your health care provider about how to take care of your puncture site. Make sure you: If present, leave stitches (sutures), skin glue, or adhesive strips in place. These skin closures may need to stay in place for up to 2 weeks. If adhesive strip edges start to loosen and curl up, you may trim the loose edges. Do not remove adhesive strips completely unless your health care provider tells you to do that. If a large square bandage is present, this may be removed 24 hours after surgery.  Check your puncture site every day for signs of infection. Check for: Redness, swelling, or pain. Fluid or blood. If your puncture site starts to bleed, lie down on your back, apply firm pressure to the area, and contact your health care provider. Warmth. Pus or a bad smell. A pea or marble sized lump/knot at the site is normal and can take up to three months to resolve.  Driving Do not drive for at least 4 days after your procedure or however long your health care provider recommends. (Do not resume driving if you have previously been instructed not to drive for other health reasons.) Do not drive or use heavy machinery while taking prescription pain medicine. Activity Avoid activities that take a lot of effort for at least 7 days after your  procedure. Do not lift anything that is heavier than 5 lb (4.5 kg) for one week.  No sexual activity for 1 week.  Return to your normal activities as told by your health care provider. Ask your health care provider what activities are safe for you. General instructions Take over-the-counter and prescription medicines only as told by your health care provider. Do not use any products that contain nicotine or tobacco, such as cigarettes and e-cigarettes. If you need help quitting, ask your health care provider. You may shower after 24 hours, but Do not take baths, swim, or use a hot tub for 1 week.  Do not drink alcohol for 24 hours after your procedure. Keep all follow-up visits as told by your health care provider. This is important. Contact a health care provider if: You have redness, mild swelling, or pain around your puncture site. You have fluid or blood coming from your puncture site that stops after applying firm pressure to the area. Your puncture site feels warm to the touch. You have pus or a bad smell coming from your puncture site. You have a fever. You have chest pain or discomfort that spreads to your neck, jaw, or arm. You have chest pain that is worse with lying on your back or taking a deep breath. You are sweating a lot. You feel nauseous. You have a fast or irregular heartbeat. You have shortness of breath. You are dizzy or light-headed and feel the need to lie down. You have pain or numbness in the arm or leg closest to your puncture site.  Get help right away if: Your puncture site suddenly swells. Your puncture site is bleeding and the bleeding does not stop after applying firm pressure to the area. These symptoms may represent a serious problem that is an emergency. Do not wait to see if the symptoms will go away. Get medical help right away. Call your local emergency services (911 in the [place] Do not drive yourself to the hospital. Summary After the procedure, it  is normal to have bruising and tenderness at the puncture site in your groin, neck, or forearm. Check your puncture site every day for signs of infection. Get help right away if your puncture site is bleeding and the bleeding does not stop after applying firm pressure to the area. This is a medical emergency. This information is not intended to replace advice given to you by your health care provider. Make sure you discuss any questions you have with your health care provider. Femoral Site Care This sheet gives you information about how to care for yourself after your procedure. Your health care provider may also give you more specific instructions. If you have problems or questions, contact your health care provider. What can I expect after the procedure?  After the procedure, it is common to have: Bruising that usually fades within 1-2 weeks. Tenderness at the site. Follow these instructions at home: Wound care Follow instructions from your health care provider about how to take care of your insertion site. Make sure you: Wash your hands with soap and water before you change your bandage (dressing). If soap and water are not available, use hand sanitizer. Remove your dressing as told by your health care provider. 24 hours Do not take baths, swim, or use a hot tub until your health care provider approves. You may shower 24-48 hours after the procedure or as told by your health care provider. Gently wash the site with plain soap and water. Pat the area dry with a clean towel. Do not rub the site. This may cause bleeding. Do not apply powder or lotion to the site. Keep the site clean and dry. Check your femoral site every day for signs of infection. Check for: Redness, swelling, or pain. Fluid or blood. Warmth. Pus or a bad smell. Activity For the first 2-3 days after your procedure, or as long as directed: Avoid climbing stairs as much as possible. Do not squat. Do not lift anything  that is heavier than 10 lb (4.5 kg), or the limit that you are told, until your health care provider says that it is safe. For 5 days Rest as directed. Avoid sitting for a long time without moving. Get up to take short walks every 1-2 hours. Do not drive for 24 hours if you were given a medicine to help you relax (sedative). General instructions Take over-the-counter and prescription medicines only as told by your health care provider. Keep all follow-up visits as told by your health care provider. This is important. Contact a health care provider if you have: A fever or chills. You have redness, swelling, or pain around your insertion site. Get help right away if: The catheter insertion area swells very fast. You pass out. You suddenly start to sweat or your skin gets clammy. The catheter insertion area is bleeding, and the bleeding does not stop when you hold steady pressure on the area. The area near or just beyond the catheter insertion site becomes pale, cool, tingly, or numb. These symptoms may represent a serious problem that  is an emergency. Do not wait to see if the symptoms will go away. Get medical help right away. Call your local emergency services (911 in the [place] Do not drive yourself to the hospital. Summary After the procedure, it is common to have bruising that usually fades within 1-2 weeks. Check your femoral site every day for signs of infection. Do not lift anything that is heavier than 10 lb (4.5 kg), or the limit that you are told, until your health care provider says that it is safe. This information is not intended to replace advice given to you by your health care provider. Make sure you discuss any questions you have with your health care provider. Document Revised: 07/16/2017 Document Reviewed: 07/16/2017 Elsevier Patient Education  2020 [employer].

## 2024-07-14 NOTE — Progress Notes (Signed)
 Patient tolerated regular diet without emesis. Son currently at the bedside.

## 2024-07-14 NOTE — Transfer of Care (Signed)
 Immediate Anesthesia Transfer of Care Note  Patient: Theresa Mendoza  Procedure(s) Performed: AV NODE ABLATION  Patient Location: PACU  Anesthesia Type:MAC  Level of Consciousness: awake, alert , and oriented  Airway & Oxygen  Therapy: Patient Spontanous Breathing and Patient connected to face mask oxygen   Post-op Assessment: Report given to RN and Post -op Vital signs reviewed and stable  Post vital signs: Reviewed and stable  Last Vitals:  Vitals Value Taken Time  BP 137/69 07/14/24 14:00  Temp    Pulse 90 07/14/24 14:00  Resp 23 07/14/24 14:00  SpO2 99 % 07/14/24 14:00  Vitals shown include unfiled device data.  Last Pain:  Vitals:   07/14/24 1137  TempSrc: Oral         Complications: There were no known notable events for this encounter.

## 2024-07-15 ENCOUNTER — Encounter (HOSPITAL_COMMUNITY): Payer: Self-pay | Admitting: Cardiology

## 2024-07-15 ENCOUNTER — Telehealth (HOSPITAL_COMMUNITY): Payer: Self-pay

## 2024-07-15 MED FILL — Cefazolin Sodium-Dextrose IV Solution 2 GM/100ML-4%: INTRAVENOUS | Qty: 100 | Status: AC

## 2024-07-15 NOTE — Telephone Encounter (Signed)
 Spoke with patient's son, [name] Cross, to complete post procedure follow up call/ DPR on file. He reports no complications with groin sites.   Instructions reviewed:  Remove large bandage at puncture site after 24 hours. It is normal to have bruising, tenderness, mild swelling, and a pea or marble sized lump/knot at the groin site which can take up to three months to resolve.  Get help right away if you notice sudden swelling at the puncture site.  Check your puncture site every day for signs of infection: fever, redness, swelling, pus drainage, warmth, foul odor or excessive pain. If this occurs, please call [phone], to speak with the RN Navigator. Get help right away if your puncture site is bleeding and the bleeding does not stop after applying firm pressure to the area.  You may continue to have skipped beats/ atrial fibrillation during the first several months after your procedure.  It is very important not to miss any doses of your blood thinner Eliquis .    You will follow up with the Dr. Inocencio 4 weeks after your procedure and follow up with the APP 3 months after your procedure.  Activity restrictions reviewed.  [name] verbalized understanding to all instructions provided.

## 2024-07-18 ENCOUNTER — Telehealth: Payer: Self-pay

## 2024-07-18 ENCOUNTER — Encounter (HOSPITAL_COMMUNITY): Payer: Self-pay

## 2024-07-18 ENCOUNTER — Emergency Department (HOSPITAL_COMMUNITY)

## 2024-07-18 ENCOUNTER — Emergency Department (HOSPITAL_COMMUNITY)
Admission: EM | Admit: 2024-07-18 | Discharge: 2024-07-18 | Disposition: A | Discharge status: Home / Self Care | Attending: Emergency Medicine | Admitting: Emergency Medicine

## 2024-07-18 ENCOUNTER — Other Ambulatory Visit: Payer: Self-pay

## 2024-07-18 DIAGNOSIS — E119 Type 2 diabetes mellitus without complications: Secondary | ICD-10-CM | POA: Diagnosis not present

## 2024-07-18 DIAGNOSIS — N189 Chronic kidney disease, unspecified: Secondary | ICD-10-CM | POA: Insufficient documentation

## 2024-07-18 DIAGNOSIS — J449 Chronic obstructive pulmonary disease, unspecified: Secondary | ICD-10-CM | POA: Insufficient documentation

## 2024-07-18 DIAGNOSIS — Z7901 Long term (current) use of anticoagulants: Secondary | ICD-10-CM | POA: Diagnosis not present

## 2024-07-18 DIAGNOSIS — I2510 Atherosclerotic heart disease of native coronary artery without angina pectoris: Secondary | ICD-10-CM | POA: Insufficient documentation

## 2024-07-18 DIAGNOSIS — R059 Cough, unspecified: Secondary | ICD-10-CM | POA: Diagnosis not present

## 2024-07-18 DIAGNOSIS — Z950 Presence of cardiac pacemaker: Secondary | ICD-10-CM | POA: Insufficient documentation

## 2024-07-18 DIAGNOSIS — Z7984 Long term (current) use of oral hypoglycemic drugs: Secondary | ICD-10-CM | POA: Diagnosis not present

## 2024-07-18 DIAGNOSIS — R0602 Shortness of breath: Secondary | ICD-10-CM | POA: Diagnosis present

## 2024-07-18 DIAGNOSIS — I509 Heart failure, unspecified: Secondary | ICD-10-CM | POA: Diagnosis not present

## 2024-07-18 DIAGNOSIS — I4891 Unspecified atrial fibrillation: Secondary | ICD-10-CM | POA: Insufficient documentation

## 2024-07-18 LAB — PRO BRAIN NATRIURETIC PEPTIDE: Pro Brain Natriuretic Peptide: 15969 pg/mL — ABNORMAL HIGH

## 2024-07-18 LAB — BASIC METABOLIC PANEL WITH GFR
Anion gap: 12 (ref 5–15)
BUN: 24 mg/dL — ABNORMAL HIGH (ref 8–23)
CO2: 27 mmol/L (ref 22–32)
Calcium: 9.9 mg/dL (ref 8.9–10.3)
Chloride: 96 mmol/L — ABNORMAL LOW (ref 98–111)
Creatinine, Ser: 0.79 mg/dL (ref 0.44–1.00)
GFR, Estimated: >60 mL/min
Glucose, Bld: 90 mg/dL (ref 70–99)
Potassium: 4.9 mmol/L (ref 3.5–5.1)
Sodium: 135 mmol/L (ref 135–145)

## 2024-07-18 LAB — RESP PANEL BY RT-PCR (RSV, FLU A&B, COVID)  RVPGX2
Influenza A by PCR: NEGATIVE
Influenza B by PCR: NEGATIVE
Resp Syncytial Virus by PCR: NEGATIVE
SARS Coronavirus 2 by RT PCR: NEGATIVE

## 2024-07-18 LAB — CBC
HCT: 45 % (ref 36.0–46.0)
Hemoglobin: 13.3 g/dL (ref 12.0–15.0)
MCH: 22.2 pg — ABNORMAL LOW (ref 26.0–34.0)
MCHC: 29.6 g/dL — ABNORMAL LOW (ref 30.0–36.0)
MCV: 75 fL — ABNORMAL LOW (ref 80.0–100.0)
Platelets: 335 K/uL (ref 150–400)
RBC: 6 MIL/uL — ABNORMAL HIGH (ref 3.87–5.11)
RDW: 19.4 % — ABNORMAL HIGH (ref 11.5–15.5)
WBC: 14.7 K/uL — ABNORMAL HIGH (ref 4.0–10.5)
nRBC: 0 % (ref 0.0–0.2)

## 2024-07-18 LAB — TROPONIN T, HIGH SENSITIVITY
Troponin T High Sensitivity: 43 ng/L — ABNORMAL HIGH (ref 0–19)
Troponin T High Sensitivity: 40 ng/L — ABNORMAL HIGH (ref 0–19)

## 2024-07-18 MED ORDER — FUROSEMIDE 10 MG/ML IJ SOLN
40.0000 mg | Freq: Once | INTRAMUSCULAR | Status: AC
  Administered 2024-07-18: 40 mg via INTRAVENOUS
  Filled 2024-07-18: qty 4

## 2024-07-18 NOTE — ED Notes (Signed)
 The patient's son, [name] Cross is requesting an update [phone]

## 2024-07-18 NOTE — Discharge Instructions (Signed)
 You were seen in the emergency department for your shortness of breath.  Your workup showed that you have a heart failure exacerbation.  I have given you a dose of Lasix  through the IV in the emergency department and you should take an extra Lasix  for the next 4 days to help get the fluid off.  You should follow-up with your cardiologist as an outpatient to have your symptoms rechecked.  You should return to the emergency department if you are having worsening shortness of breath, severe chest pain, you pass out or any other new or concerning symptoms.

## 2024-07-18 NOTE — ED Notes (Signed)
 Per pt, she has called her son [name] to pick her up. Discharge pending.

## 2024-07-18 NOTE — ED Notes (Signed)
 Patient ambulated in hallway/to restroom with minimal assistance. EDP notified.

## 2024-07-18 NOTE — ED Triage Notes (Signed)
 Pt bib [facility] coming from home with CC of shortness of breath. Pt found to be 89% on room air by FD and placed on [place]. On EMS arrival, pt placed on NRB at 8L. Pt wheezing, duoneb given by EMS. Pt had pacemaker placed three days ago and has experienced shortness of breath x2 with chest pressure. GCS 15. Pt room air SpO2 94%.  EMS VS: 180/98 90pulse 100% 8L NRB

## 2024-07-18 NOTE — Telephone Encounter (Signed)
 Pt son called in letting us  know that pt just got out of the hospital with a new Dx and wants to know does pt need to come in for f/u with WC

## 2024-07-18 NOTE — ED Notes (Signed)
 NT assisted pt onto bedpan 3 times due to pt taking lasix  pt has not rang out at this time

## 2024-07-18 NOTE — ED Provider Notes (Signed)
 " [place] EM[facility] DEPARTMENT AT [facility] Provider Note   CSN: [id number] Arrival date & time: 07/18/24  0825     Patient presents with: Shortness of Breath   Theresa Mendoza is a 79 y.o. female.   Patient is a 79 year old female with a past medical history of COPD on 2 L nasal cannula, A-fib on Eliquis  status post recent pacemaker placement and ablation, CHF, CAD, CKD, diabetes presenting to the emergency department with shortness of breath.  Patient was discharged on Monday after her ablation procedure and initially it was feeling well.  She states that yesterday afternoon she started to feel short of breath but it went away on its own and then was unable to sleep last night due to the shortness of breath so she called 911.  She states that she has been feeling feverish at home and has had a productive cough but has not looked at her sputum.  She states that she has had some nausea but denies any vomiting.  She states that she has some chest tightness but denies any chest pain.  She states that she has had lower extremity swelling since she left the hospital on Monday.  She denies any known sick contacts.  She was hypoxic to 89% on room air on EMS arrival.  They placed her on nonrebreather and gave her 1 DuoNeb and route and she reports some improvement of her shortness of breath.  The history is provided by the patient and the EMS personnel.  Shortness of Breath      Prior to Admission medications  Medication Sig Start Date End Date Taking? Authorizing Provider  apixaban  (ELIQUIS ) 5 MG TABS tablet Take 1 tablet (5 mg total) by mouth 2 (two) times daily. NEEDS CARDIOLOGY APPT FOR ELIQUIS  REFILLS, PLEASE CALL OFFICE 02/20/24   Arrien, Elidia Sieving, MD  DULoxetine  (CYMBALTA ) 60 MG capsule Take 2 capsules (120 mg total) by mouth daily. 04/23/24   Rhys Boyer T, PA-C  empagliflozin  (JARDIANCE ) 25 MG TABS tablet Take 1 tablet (25 mg total) by mouth daily. Patient not  taking: Reported on 07/01/2024 02/20/24   Arrien, Elidia Sieving, MD  furosemide  (LASIX ) 20 MG tablet Take 20 mg by mouth every other day.    [provider]  HYDROcodone -acetaminophen  (NORCO) 10-325 MG tablet Take 1 tablet by mouth 3 (three) times daily. 03/09/24   [provider]  hydrOXYzine  (ATARAX ) 25 MG tablet Take 1 tablet (25 mg total) by mouth 3 (three) times daily as needed for anxiety. 04/05/24   Vann, Jessica U, DO  metFORMIN  (GLUCOPHAGE ) 500 MG tablet Take 1 tablet (500 mg total) by mouth 2 (two) times daily with a meal. 10/15/21 07/14/24  Kathrin Mignon DASEN, MD  metoprolol  succinate (TOPROL -XL) 100 MG 24 hr tablet Take 1 tablet (100 mg total) by mouth 2 (two) times daily. 07/03/24   Camnitz, Soyla Lunger, MD  Multiple Vitamins-Minerals (PRESERVISION AREDS 2 PO) Take 2 tablets by mouth daily.    [provider]  nitroGLYCERIN  (NITROSTAT ) 0.4 MG SL tablet Place 1 tablet (0.4 mg total) under the tongue every 5 (five) minutes as needed for chest pain. 09/24/23   Cheryle Page, MD  ondansetron  (ZOFRAN ) 4 MG tablet Take 4 mg by mouth every 8 (eight) hours as needed for vomiting or nausea. 02/26/24   [provider]  pravastatin  (PRAVACHOL ) 40 MG tablet Take 1 tablet by mouth daily. 03/17/21   [provider]    Allergies: Lithobid [lithium] and Tegretol [carbamazepine]  Review of Systems  Respiratory:  Positive for shortness of breath.     Updated Vital Signs BP 118/87   Pulse 90   Temp 98.1 F (36.7 C) (Axillary)   Resp (!) 24   Ht 5' 5 (1.651 m)   Wt 74.8 kg   SpO2 98%   BMI 27.46 kg/m   Physical Exam Vitals and nursing note reviewed.  Constitutional:      General: She is not in acute distress.    Appearance: She is well-developed.  HENT:     Head: Normocephalic.     Mouth/Throat:     Mouth: Mucous membranes are moist.  Eyes:     Extraocular Movements: Extraocular movements intact.  Cardiovascular:     Rate and Rhythm: Normal rate  and regular rhythm.  Pulmonary:     Effort: Pulmonary effort is normal.     Breath sounds: Wheezing (trace end expiratory) and rales (bilateral) present.  Chest:     Comments: Pacemaker site appears well healing with no swelling, no surrounding erythema, warmth or drainage Abdominal:     Palpations: Abdomen is soft.     Tenderness: There is no abdominal tenderness.  Musculoskeletal:        General: Normal range of motion.     Cervical back: Normal range of motion and neck supple.     Right lower leg: Edema (2+ to knees) present.     Left lower leg: Edema (2+ to knees) present.  Skin:    General: Skin is warm and dry.  Neurological:     General: No focal deficit present.     Mental Status: She is alert and oriented to person, place, and time.  Psychiatric:        Mood and Affect: Mood normal.        Behavior: Behavior normal.     (all labs ordered are listed, but only abnormal results are displayed) Labs Reviewed  BASIC METABOLIC PANEL WITH GFR - Abnormal; Notable for the following components:      Result Value   Chloride 96 (*)    BUN 24 (*)    All other components within normal limits  CBC - Abnormal; Notable for the following components:   WBC 14.7 (*)    RBC 6.00 (*)    MCV 75.0 (*)    MCH 22.2 (*)    MCHC 29.6 (*)    RDW 19.4 (*)    All other components within normal limits  PRO BRAIN NATRIURETIC PEPTIDE - Abnormal; Notable for the following components:   Pro Brain Natriuretic Peptide 15,969.0 (*)    All other components within normal limits  TROPONIN T, HIGH SENSITIVITY - Abnormal; Notable for the following components:   Troponin T High Sensitivity 43 (*)    All other components within normal limits  TROPONIN T, HIGH SENSITIVITY - Abnormal; Notable for the following components:   Troponin T High Sensitivity 40 (*)    All other components within normal limits  RESP PANEL BY RT-PCR (RSV, FLU A&B, COVID)  [id number]    EKG: EKG Interpretation Date/Time:  Friday  July 18 2024 08:29:16 EST Ventricular Rate:  90 PR Interval:    QRS Duration:  124 QT Interval:  404 QTC Calculation: 495 R Axis:   9  Text Interpretation: Afib/flutter and ventricular-paced rhythm No further analysis attempted due to paced rhythm Interpretation limited secondary to artifact Paced rhythm compared to prior EKG Confirmed by Ellouise Fine ([id number]) on 07/18/2024 8:42:39 AM  Radiology: [facility]  Chest Portable 1 View Result Date: 07/18/2024 CLINICAL DATA:  Shortness of breath EXAM: PORTABLE CHEST 1 VIEW COMPARISON:  May 07, 2024 FINDINGS: The heart size and mediastinal contours are within normal limits. Stable left-sided pacemaker. Both lungs are clear. The visualized skeletal structures are unremarkable. IMPRESSION: No active disease. Electronically Signed   By: Lynwood Landy Raddle M.D.   On: 07/18/2024 09:43     Ultrasound ED Echo  Date/Time: 07/18/2024 11:30 AM  Performed by: Ellouise Richerd POUR, DO Authorized by: Ellouise Richerd POUR, DO   Procedure details:    Indications: dyspnea     Views: subxiphoid and parasternal long axis view     Images: archived     Limitations:  Positioning and increased thoracic air Findings:    Pericardium: no pericardial effusion     LV Function: normal (>50% EF)   Impression:    Impression: normal      Medications Ordered in the ED  furosemide  (LASIX ) injection 40 mg (40 mg Intravenous Given 07/18/24 1148)    Clinical Course as of 07/18/24 1333  Fri Jul 18, 2024  1120 BNP significantly elevated concerning for CHF exacerbation. Troponin minimally elevated, will need repeat. IV lasix  ordered.  [VK]  1131 Bedside echo with no pericardial effusion, lower concern for post-op complication from ablation. Patient would prefer to go home today. Will plan for ambulatory trial after lasix  for disposition.  [VK]  1310 Repeat troponin flat, will have ambulatory trial.  [VK]  1330 Patient feels improved, able to ambulate with increased WOB or  hypoxia. She is stable for discharge home with outpatient cardiology follow up. [VK]    Clinical Course User Index [VK] Kingsley, Mayleigh Tetrault K, DO                                 Medical Decision Making This patient presents to the ED with chief complaint(s) of SOB with pertinent past medical history of COPD on 2L [place], CHF, A fib s/p recent ablation and pacemaker placement, CKD, DM which further complicates the presenting complaint. The complaint involves an extensive differential diagnosis and also carries with it a high risk of complications and morbidity.    The differential diagnosis includes ACS, arrhythmia, anemia, pneumonia, pneumothorax, pulmonary edema, pleural effusion, viral syndrome, COPD exacerbation, considering pericardial effusion or other postop complication  Additional history obtained: Additional history obtained from EMS  Records reviewed previous admission documents  ED Course and Reassessment: On patient's arrival she is hemodynamically stable in no acute distress.  Is being paced on EKG without acute ischemic changes.  She does have some trace end expiratory wheeze and also has some crackles and edema concerning for volume overload.  Patient was given 1 DuoNeb by EMS and offered additional neb and steroids here but declined stating that the steroids make her feel crazy.  Patient will have labs including BNP, viral swab chest x-ray performed.  Will likely plan to discuss with cardiology with her recent ablation procedure and she will be closely reassessed.  Independent labs interpretation:  The following labs were independently interpreted: elevated BNP concerning for CHF, minimally elevated troponin -> flat on repeat, leukocytosis  Independent visualization of imaging: - I independently visualized the following imaging with scope of interpretation limited to determining acute life threatening conditions related to emergency care: CXR, which revealed no acute  disease  Consultation: - Consulted or discussed management/test interpretation w/ external professional: N/A  Consideration for  admission or further workup: Patient has no emergent conditions requiring admission or further work-up at this time and is stable for discharge home with primary care follow-up  Social Determinants of health: N/A    Amount and/or Complexity of Data Reviewed Labs: ordered. Radiology: ordered.  Risk Prescription drug management.       Final diagnoses:  Acute on chronic congestive heart failure, unspecified heart failure type [facility])    ED Discharge Orders          Ordered    Ambulatory referral to Cardiology       Comments: If you have not heard from the Cardiology office within the next 72 hours please call [phone].   07/18/24 1331               Ellouise Richerd POUR, DO 07/18/24 1333  "

## 2024-07-21 NOTE — Telephone Encounter (Signed)
 Returned call to son.  Aware pt already has a general cardiologist.  Explained that when they follow up later this month or at March appt, they can discuss when need to see general cardiologist next. Son agreeable to plan.

## 2024-07-24 ENCOUNTER — Ambulatory Visit: Admitting: Physician Assistant

## 2024-07-29 ENCOUNTER — Ambulatory Visit: Payer: Self-pay | Admitting: Cardiology

## 2024-07-29 ENCOUNTER — Ambulatory Visit: Attending: Cardiology | Admitting: *Deleted

## 2024-07-29 DIAGNOSIS — I5020 Unspecified systolic (congestive) heart failure: Secondary | ICD-10-CM

## 2024-07-29 LAB — CUP PACEART INCLINIC DEVICE CHECK
Battery Remaining Longevity: 94 mo
Battery Voltage: 3.01 V
Brady Statistic RV Percent Paced: 99.55 %
Date Time Interrogation Session: 20260113172935
Implantable Lead Connection Status: 753985
Implantable Lead Implant Date: 20251022
Implantable Lead Location: 753860
Implantable Pulse Generator Implant Date: 20251022
Lead Channel Impedance Value: 550 Ohm
Lead Channel Pacing Threshold Amplitude: 0.75 V
Lead Channel Pacing Threshold Amplitude: 0.75 V
Lead Channel Pacing Threshold Pulse Width: 0.5 ms
Lead Channel Pacing Threshold Pulse Width: 0.5 ms
Lead Channel Setting Pacing Amplitude: 2.5 V
Lead Channel Setting Pacing Pulse Width: 0.5 ms
Lead Channel Setting Sensing Sensitivity: 4 mV
Pulse Gen Model: 1272
Pulse Gen Serial Number: 8250520

## 2024-07-29 NOTE — Patient Instructions (Signed)

## 2024-07-29 NOTE — Progress Notes (Signed)
 Normal single chamber pacemaker wound check. Presenting rhythm: VP. Wound well healed. Routine testing performed. Thresholds, sensing, and impedance consistent with implant measurements and at 3.5V safety margin/auto capture until 3 month visit. No episodes. Reviewed arm restrictions to continue for 6 weeks total post op.  Pt enrolled in remote follow-up.

## 2024-08-11 ENCOUNTER — Ambulatory Visit: Admitting: Cardiology

## 2024-08-11 ENCOUNTER — Encounter: Payer: Self-pay | Admitting: Physician Assistant

## 2024-08-11 ENCOUNTER — Telehealth: Admitting: Physician Assistant

## 2024-08-11 DIAGNOSIS — F17200 Nicotine dependence, unspecified, uncomplicated: Secondary | ICD-10-CM

## 2024-08-11 DIAGNOSIS — G894 Chronic pain syndrome: Secondary | ICD-10-CM | POA: Diagnosis not present

## 2024-08-11 DIAGNOSIS — F4323 Adjustment disorder with mixed anxiety and depressed mood: Secondary | ICD-10-CM

## 2024-08-11 MED ORDER — DULOXETINE HCL 60 MG PO CPEP: 120.0000 mg | ORAL_CAPSULE | Freq: Every day | ORAL | 0 refills | Status: AC
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
## 2024-08-12 ENCOUNTER — Telehealth: Payer: Self-pay | Admitting: Cardiology

## 2024-08-12 NOTE — Telephone Encounter (Signed)
 Verneita with [facility] is following up regarding a call with someone on 1/21. She was advised a message was sent to an [place] with Pre Cert team--Teresa says procedure has to be appealed because per coding, only 1 lead attached to PPM, code# 66792. [phone] was authorized due to dual chamber authorized in October. Please clarify.

## 2024-08-21 NOTE — Progress Notes (Unsigned)
 " Electrophysiology Office Note:   Date:  08/22/2024  ID:  Theresa Mendoza, Theresa Mendoza 02-10-1946, MRN [id number]  Primary Cardiologist: Wilbert Bihari, MD Primary Heart Failure: None Electrophysiologist: Will Gladis Norton, MD       History of Present Illness:   Theresa Mendoza is a 79 y.o. female with h/o AF, CAD s/p MI, combined systolic/diastolic HF, AV stenosis, HTN, tobacco abuse, COPD, chronic hypoxic respiratory failure, pHTN, DM II, CKD IIIa, depression, dementia, chronic back pain seen today for routine electrophysiology follow-up s/p PPM implant and AV node ablation.  She underwent PPM placement 05/07/24 and subsequent AF/AFL ablation 07/03/24 and then AV node ablation 07/14/24. By chart search, her Toprol  was increased to 100 mg BID at some point between 04/2024 and 06/2024.   Seen in ER on 07/18/24 with fever, productive cough, shortness of breath and nausea.  She was hypoxic on RA and was treated with DuoNeb. There were concerns for possible volume overload with elevated BNP, flat troponin (40 > 43), leukocytosis (14.7).  Viral panel negative. CXR negative. She was felt stable for discharge from ER.    Since last being seen in our clinic the patient reports doing ok. Her son accompanies her to the visit. She notes she has had occasional pain at her pocket site - a stinging nerve pain.  She reports she continues to smoke.   She denies chest pain, palpitations, dyspnea, PND, orthopnea, nausea, vomiting, dizziness, syncope, edema, weight gain, or early satiety.    Review of systems complete and found to be negative unless listed in HPI.   EP Information / Studies Reviewed:    EKG is not ordered today. EKG from 07/21/24 reviewed which showed coarse AF 90 bpm      PPM Interrogation-  reviewed in detail today,  See PACEART report.  Arrhythmia/Device History: Abbott Single Chamber PPM implanted 05/07/24 for Uncontrolled atrial arrhyhtmia s/p AV node ablation Dependent at 40 bpm 08/22/24 in  clinic interrogation  Physical Exam:   VS:  BP (!) 144/90   Pulse 90   Ht 5' 5 (1.651 m)   Wt 160 lb (72.6 kg)   SpO2 96%   BMI 26.63 kg/m    Wt Readings from Last 3 Encounters:  08/22/24 160 lb (72.6 kg)  07/18/24 165 lb (74.8 kg)  07/14/24 166 lb (75.3 kg)     GEN: Well nourished, well developed in no acute distress NECK: No JVD; No carotid bruits CARDIAC: Regular rate and rhythm (VP), no murmurs, rubs, gallops RESPIRATORY:  Clear to auscultation without rales, wheezing or rhonchi  ABDOMEN: Soft, non-tender, non-distended EXTREMITIES:  No edema; No deformity   ASSESSMENT AND PLAN:    Uncontrolled atrial arrhyhtmia s/p AV node ablation s/p Abbott PPM  Tachy-Brady Syndrome  -Normal PPM function -See Pace Art report -No changes today -Toprol  100 mg BID > consider reduction now that she is post AV node ablation  -LRL reduced to 80 bpm, plan to see in one month and go to 70 bpm -discussed position / anchoring of device and as she heals, the site pain should reduce / improve. If not, let EP know.   Secondary Hypercoagulable State  -continue Eliquis  5mg  BID, dose reviewed and appropriate by age / wt   CAD s/p MI  -no anginal symptoms   HFrecEF  LVEF 60-65% 09/2023  -euvolemic on exam   Disposition:   Follow up with EP APP in 4 weeks  Signed, Daphne Barrack, NP-C, AGACNP-BC  [facility] - Elect[facility]iology  08/22/2024, 2:51 PM  "

## 2024-08-22 ENCOUNTER — Ambulatory Visit: Admitting: Pulmonary Disease

## 2024-08-22 ENCOUNTER — Encounter: Payer: Self-pay | Admitting: Pulmonary Disease

## 2024-08-22 VITALS — BP 144/90 | HR 90 | Ht 65.0 in | Wt 160.0 lb

## 2024-08-22 DIAGNOSIS — I2510 Atherosclerotic heart disease of native coronary artery without angina pectoris: Secondary | ICD-10-CM

## 2024-08-22 DIAGNOSIS — Z950 Presence of cardiac pacemaker: Secondary | ICD-10-CM

## 2024-08-22 DIAGNOSIS — I4891 Unspecified atrial fibrillation: Secondary | ICD-10-CM

## 2024-08-22 DIAGNOSIS — I495 Sick sinus syndrome: Secondary | ICD-10-CM

## 2024-08-22 LAB — CUP PACEART INCLINIC DEVICE CHECK
Battery Remaining Longevity: 94 mo
Battery Voltage: 3.01 V
Brady Statistic RV Percent Paced: 99.78 %
Date Time Interrogation Session: 20260206144720
Implantable Lead Connection Status: 753985
Implantable Lead Implant Date: 20251022
Implantable Lead Location: 753860
Implantable Pulse Generator Implant Date: 20251022
Lead Channel Impedance Value: 550 Ohm
Lead Channel Pacing Threshold Amplitude: 0.75 V
Lead Channel Pacing Threshold Amplitude: 0.75 V
Lead Channel Pacing Threshold Pulse Width: 0.5 ms
Lead Channel Pacing Threshold Pulse Width: 0.5 ms
Lead Channel Setting Pacing Amplitude: 2.5 V
Lead Channel Setting Pacing Pulse Width: 0.5 ms
Lead Channel Setting Sensing Sensitivity: 4 mV
Pulse Gen Model: 1272
Pulse Gen Serial Number: 8250520

## 2024-08-22 NOTE — Patient Instructions (Signed)
 Medication Instructions:  Your physician recommends that you continue on your current medications as directed. Please refer to the Current Medication list given to you today.  *If you need a refill on your cardiac medications before your next appointment, please call your pharmacy*  Lab Work: None ordered If you have labs (blood work) drawn today and your tests are completely normal, you will receive your results only by: MyChart Message (if you have MyChart) OR A paper copy in the mail If you have any lab test that is abnormal or we need to change your treatment, we will call you to review the results.  Follow-Up: At [facility], you and your health needs are our priority.  As part of our continuing mission to provide you with exceptional heart care, our providers are all part of one team.  This team includes your primary Cardiologist (physician) and Advanced Practice Providers or APPs (Physician Assistants and Nurse Practitioners) who all work together to provide you with the care you need, when you need it.  Your next appointment:   1 month(s)  Provider:   Daphne Barrack, NP

## 2024-09-17 ENCOUNTER — Ambulatory Visit

## 2024-10-13 ENCOUNTER — Ambulatory Visit: Admitting: Pulmonary Disease

## 2024-10-13 ENCOUNTER — Ambulatory Visit: Admitting: Physician Assistant

## 2024-10-16 ENCOUNTER — Ambulatory Visit: Admitting: Pulmonary Disease

## 2024-12-17 ENCOUNTER — Ambulatory Visit

## 2025-03-18 ENCOUNTER — Ambulatory Visit

## 2025-06-17 ENCOUNTER — Ambulatory Visit

## 2025-09-16 ENCOUNTER — Ambulatory Visit
